# Patient Record
Sex: Female | Born: 1955 | Race: White | Hispanic: No | Marital: Married | Smoking: Former smoker
Health system: Southern US, Community
[De-identification: ages and names within clinical notes are randomized; demographics above are authoritative.]

## PROBLEM LIST (undated history)

## (undated) DIAGNOSIS — I509 Heart failure, unspecified: Secondary | ICD-10-CM

## (undated) DIAGNOSIS — M1711 Unilateral primary osteoarthritis, right knee: Secondary | ICD-10-CM

## (undated) DIAGNOSIS — Z7901 Long term (current) use of anticoagulants: Secondary | ICD-10-CM

## (undated) DIAGNOSIS — L719 Rosacea, unspecified: Secondary | ICD-10-CM

## (undated) DIAGNOSIS — M48061 Spinal stenosis, lumbar region without neurogenic claudication: Secondary | ICD-10-CM

## (undated) DIAGNOSIS — K219 Gastro-esophageal reflux disease without esophagitis: Secondary | ICD-10-CM

## (undated) DIAGNOSIS — B019 Varicella without complication: Secondary | ICD-10-CM

## (undated) DIAGNOSIS — E785 Hyperlipidemia, unspecified: Secondary | ICD-10-CM

## (undated) DIAGNOSIS — E119 Type 2 diabetes mellitus without complications: Secondary | ICD-10-CM

## (undated) DIAGNOSIS — K760 Fatty (change of) liver, not elsewhere classified: Secondary | ICD-10-CM

## (undated) DIAGNOSIS — M1990 Unspecified osteoarthritis, unspecified site: Secondary | ICD-10-CM

## (undated) DIAGNOSIS — G4730 Sleep apnea, unspecified: Secondary | ICD-10-CM

## (undated) DIAGNOSIS — E1140 Type 2 diabetes mellitus with diabetic neuropathy, unspecified: Secondary | ICD-10-CM

## (undated) DIAGNOSIS — R011 Cardiac murmur, unspecified: Secondary | ICD-10-CM

## (undated) DIAGNOSIS — C801 Malignant (primary) neoplasm, unspecified: Secondary | ICD-10-CM

## (undated) DIAGNOSIS — T7840XA Allergy, unspecified, initial encounter: Secondary | ICD-10-CM

## (undated) DIAGNOSIS — L4050 Arthropathic psoriasis, unspecified: Secondary | ICD-10-CM

## (undated) DIAGNOSIS — I10 Essential (primary) hypertension: Secondary | ICD-10-CM

## (undated) DIAGNOSIS — I00 Rheumatic fever without heart involvement: Secondary | ICD-10-CM

## (undated) DIAGNOSIS — L409 Psoriasis, unspecified: Secondary | ICD-10-CM

## (undated) DIAGNOSIS — C541 Malignant neoplasm of endometrium: Secondary | ICD-10-CM

## (undated) HISTORY — DX: Unspecified osteoarthritis, unspecified site: M19.90

## (undated) HISTORY — DX: Varicella without complication: B01.9

## (undated) HISTORY — DX: Rosacea, unspecified: L71.9

## (undated) HISTORY — DX: Cardiac murmur, unspecified: R01.1

## (undated) HISTORY — DX: Gastro-esophageal reflux disease without esophagitis: K21.9

## (undated) HISTORY — DX: Malignant (primary) neoplasm, unspecified: C80.1

## (undated) HISTORY — DX: Heart failure, unspecified: I50.9

## (undated) HISTORY — DX: Allergy, unspecified, initial encounter: T78.40XA

## (undated) HISTORY — DX: Psoriasis, unspecified: L40.9

## (undated) HISTORY — DX: Essential (primary) hypertension: I10

## (undated) HISTORY — DX: Sleep apnea, unspecified: G47.30

## (undated) HISTORY — DX: Hyperlipidemia, unspecified: E78.5

## (undated) HISTORY — DX: Rheumatic fever without heart involvement: I00

## (undated) HISTORY — PX: ABDOMINAL HYSTERECTOMY: SHX81

## (undated) HISTORY — PX: MOUTH SURGERY: SHX715

---

## 2014-01-07 LAB — HEPATIC FUNCTION PANEL
ALK PHOS: 93 U/L (ref 25–125)
ALT: 17 U/L (ref 7–35)
AST: 22 U/L (ref 13–35)
BILIRUBIN, TOTAL: 0.2 mg/dL

## 2014-01-07 LAB — TSH: TSH: 2.71 u[IU]/mL (ref <=5.90)

## 2014-01-07 LAB — LIPID PANEL
Cholesterol: 179 mg/dL (ref 0–200)
HDL: 51 mg/dL (ref 35–70)
LDL CALC: 95 mg/dL
Triglycerides: 163 mg/dL — AB (ref 40–160)

## 2014-01-07 LAB — HEMOGLOBIN A1C: Hgb A1c MFr Bld: 6.3 % — AB (ref 4.0–6.0)

## 2014-01-07 LAB — BASIC METABOLIC PANEL
CREATININE: 0.8 mg/dL (ref <=1.1)
POTASSIUM: 4.5 mmol/L (ref 3.4–5.3)
Sodium: 143 mmol/L (ref 137–147)

## 2014-04-12 ENCOUNTER — Encounter: Payer: Self-pay | Admitting: Internal Medicine

## 2014-04-12 ENCOUNTER — Encounter (INDEPENDENT_AMBULATORY_CARE_PROVIDER_SITE_OTHER): Payer: Self-pay

## 2014-04-12 ENCOUNTER — Ambulatory Visit (INDEPENDENT_AMBULATORY_CARE_PROVIDER_SITE_OTHER): Payer: BC Managed Care – PPO | Admitting: Internal Medicine

## 2014-04-12 VITALS — BP 148/92 | HR 68 | Temp 97.7°F | Ht 61.75 in | Wt 241.5 lb

## 2014-04-12 DIAGNOSIS — F458 Other somatoform disorders: Secondary | ICD-10-CM

## 2014-04-12 DIAGNOSIS — L409 Psoriasis, unspecified: Secondary | ICD-10-CM | POA: Insufficient documentation

## 2014-04-12 DIAGNOSIS — I10 Essential (primary) hypertension: Secondary | ICD-10-CM | POA: Insufficient documentation

## 2014-04-12 DIAGNOSIS — R0989 Other specified symptoms and signs involving the circulatory and respiratory systems: Secondary | ICD-10-CM

## 2014-04-12 DIAGNOSIS — K219 Gastro-esophageal reflux disease without esophagitis: Secondary | ICD-10-CM

## 2014-04-12 DIAGNOSIS — J302 Other seasonal allergic rhinitis: Secondary | ICD-10-CM | POA: Insufficient documentation

## 2014-04-12 DIAGNOSIS — M1990 Unspecified osteoarthritis, unspecified site: Secondary | ICD-10-CM

## 2014-04-12 DIAGNOSIS — E785 Hyperlipidemia, unspecified: Secondary | ICD-10-CM | POA: Insufficient documentation

## 2014-04-12 NOTE — Assessment & Plan Note (Signed)
She will self refer to dermatology

## 2014-04-12 NOTE — Patient Instructions (Signed)

## 2014-04-12 NOTE — Assessment & Plan Note (Signed)
Elevated today She is insistent that she does not want to start medication at this time If still elevated when she returns for her physical exam, will insist on medicinal therapy Encouraged her to work on diet and weight loss

## 2014-04-12 NOTE — Assessment & Plan Note (Signed)
Controlled on current dose of prilosec

## 2014-04-12 NOTE — Assessment & Plan Note (Signed)
Encouraged her to work on diet and exercise Water aerobics would be good for her given her history of arthritis

## 2014-04-12 NOTE — Assessment & Plan Note (Signed)
Mainly hips and neck Worse lately due to weight gain Encouraged her to work on weight loss with diet and exercise Continue Advil prn

## 2014-04-12 NOTE — Progress Notes (Signed)
Pre visit review using our clinic review tool, if applicable. No additional management support is needed unless otherwise documented below in the visit note. 

## 2014-04-12 NOTE — Assessment & Plan Note (Signed)
Uses Benadryl at night when symptoms occur Advised her to start Zyrtec every am to see if this helps

## 2014-04-12 NOTE — Assessment & Plan Note (Signed)
Lipid profile reviewed Diet controlled

## 2014-04-12 NOTE — Progress Notes (Signed)
HPI  Pt presents to the clinic today to establish care. She is transferring care from Dr. Rock Nephew at [facility], although she has not been seen there in a number of years.  Flu: never Tetanus: > 10 years ago LMP: 2012- post menopausal Pap Smear: 2002 Mammogram: 2002 Colon Screening: never Vision Screening: as needed Dentist: as needed  Arthritis: mainly in hips and neck. She takes Advil every night to help relieve the pain.  GERD: Takes prilosec daily. She recently started on this, and reports that it is working well for her. She is not having to use any additional antacids.  Seasonal Allergies: Takes benadryl when she notices the symptoms.  HTN: BP is elevated today at 148/92. She has been on medication for this in the past. She lost her insurance 3 years ago and had to stop her medication. She does note that she has put on weight since that time. She does plan on going to the gym to get some of the weight off.  HLD: She brought a copy of her labs to review. Total cholesterol 179, LDL 95, Triglycerides 163. Diet controlled.  Of note, her A1C is 6.3%  She is concerned about a tightening sensation in the throat. She noticed this a few months ago. The prilosec has not helped. Nothing makes it better or worse. She has not choked on her food.  Past Medical History  Diagnosis Date  . Arthritis   . Chicken pox   . GERD (gastroesophageal reflux disease)   . Allergy   . Heart murmur   . Hyperlipidemia   . Hypertension   . Rheumatic fever     Current Outpatient Prescriptions  Medication Sig Dispense Refill  . Calcium Carbonate Antacid (ANTACID CALCIUM PO) Take 1 tablet by mouth.    . Cholecalciferol (VITAMIN D3) 400 UNITS CAPS Take 1 capsule by mouth daily.    . diphenhydrAMINE (BENADRYL) 25 mg capsule Take 50 mg by mouth at bedtime.    Marland Kitchen ibuprofen (ADVIL,MOTRIN) 200 MG tablet Take 800 mg by mouth daily.    Marland Kitchen omeprazole (PRILOSEC) 40 MG capsule Take 40 mg by mouth daily.     . vitamin C (ASCORBIC ACID) 500 MG tablet Take 500 mg by mouth daily.     No current facility-administered medications for this visit.    No Known Allergies  Family History  Problem Relation Age of Onset  . Cancer Mother     skin  . Dementia Mother   . Cancer Sister     skin and liver    History   Social History  . Marital Status: Married    Spouse Name: N/A    Number of Children: N/A  . Years of Education: N/A   Occupational History  . Not on file.   Social History Main Topics  . Smoking status: Former Research scientist (life sciences)  . Smokeless tobacco: Never Used     Comment: quit in 1997  . Alcohol Use: No  . Drug Use: Not on file  . Sexual Activity: Not on file   Other Topics Concern  . Not on file   Social History Narrative  . No narrative on file    ROS:  Constitutional: Denies fever, malaise, fatigue, headache or abrupt weight changes.  HEENT: Pt reports sore throat. Denies eye pain, eye redness, ear pain, ringing in the ears, wax buildup, runny nose, nasal congestion, bloody nose. Respiratory: Denies difficulty breathing, shortness of breath, cough or sputum production.   Cardiovascular: Denies chest  pain, chest tightness, palpitations or swelling in the hands or feet.  Gastrointestinal: Denies abdominal pain, bloating, constipation, diarrhea or blood in the stool.  GU: Pt reports "fallen bladder".Denies frequency, urgency, pain with urination, blood in urine, odor or discharge. Musculoskeletal: Pt reports hip pain, neck pain. Denies decrease in range of motion, difficulty with gait, muscle pain or joint swelling.  Skin: Pt reports psoriasis. Denies redness or ulcercations.  Neurological: Denies dizziness, difficulty with memory, difficulty with speech or problems with balance and coordination. Psych: Pt denies depression, anxiety/SI/HI.   No other specific complaints in a complete review of systems (except as listed in HPI above).  PE:  Ht 5' 1.75" (1.568 m)  Wt 241 lb  8 oz (109.544 kg)  BMI 44.56 kg/m2 Wt Readings from Last 3 Encounters:  04/12/14 241 lb 8 oz (109.544 kg)    General: Appears her stated age, obese but well developed, well nourished in NAD. HEENT: Ears: Tm's gray and intact, normal light reflex; Throat/Mouth: Teeth present, mucosa pink and moist, + PND, no lesions or ulcerations noted.  Cardiovascular: Normal rate and rhythm. S1,S2 noted. Murmur noted.   Pulmonary/Chest: Normal effort and positive vesicular breath sounds. No respiratory distress. No wheezes, rales or ronchi noted.  Abdomen: Soft and nontender. Normal bowel sounds, no bruits noted. No distention or masses noted. Liver, spleen and kidneys non palpable. Musculoskeletal: Normal flexion, extension of the cervical spine. Decreased lateral rotation. No pain with palpation of the cervical spine. Decreased internal and external flexion of the hips (due to size). Strength 5/5 BUE/BLE.   Assessment and Plan:  Globus sensation:  Lets give the prilosec a little more time If still present when we do you CPE- will see if GI will consider possible EGD when we refer you for your colonoscopy.  Sore throat secondary to PND:  Start Zyrtec in the am OK to continue Benadryl QHS

## 2014-04-13 ENCOUNTER — Encounter: Payer: Self-pay | Admitting: Internal Medicine

## 2014-04-14 ENCOUNTER — Telehealth: Payer: Self-pay | Admitting: Internal Medicine

## 2014-04-14 NOTE — Telephone Encounter (Signed)
emmi emailed °

## 2014-05-16 ENCOUNTER — Encounter: Payer: Self-pay | Admitting: Internal Medicine

## 2014-05-16 ENCOUNTER — Ambulatory Visit (INDEPENDENT_AMBULATORY_CARE_PROVIDER_SITE_OTHER): Payer: BC Managed Care – PPO | Admitting: Internal Medicine

## 2014-05-16 VITALS — BP 142/90 | HR 62 | Temp 97.9°F | Ht 61.66 in | Wt 246.0 lb

## 2014-05-16 DIAGNOSIS — Z1211 Encounter for screening for malignant neoplasm of colon: Secondary | ICD-10-CM

## 2014-05-16 DIAGNOSIS — B372 Candidiasis of skin and nail: Secondary | ICD-10-CM

## 2014-05-16 DIAGNOSIS — Z0000 Encounter for general adult medical examination without abnormal findings: Secondary | ICD-10-CM

## 2014-05-16 DIAGNOSIS — IMO0002 Reserved for concepts with insufficient information to code with codable children: Secondary | ICD-10-CM

## 2014-05-16 DIAGNOSIS — Z1239 Encounter for other screening for malignant neoplasm of breast: Secondary | ICD-10-CM

## 2014-05-16 DIAGNOSIS — N8110 Cystocele, unspecified: Secondary | ICD-10-CM

## 2014-05-16 DIAGNOSIS — I10 Essential (primary) hypertension: Secondary | ICD-10-CM

## 2014-05-16 LAB — COMPREHENSIVE METABOLIC PANEL
ALT: 17 U/L (ref 0–35)
AST: 20 U/L (ref 0–37)
Albumin: 3.8 g/dL (ref 3.5–5.2)
Alkaline Phosphatase: 80 U/L (ref 39–117)
BILIRUBIN TOTAL: 0.5 mg/dL (ref 0.2–1.2)
BUN: 18 mg/dL (ref 6–23)
CO2: 30 meq/L (ref 19–32)
CREATININE: 0.9 mg/dL (ref 0.4–1.2)
Calcium: 9.5 mg/dL (ref 8.4–10.5)
Chloride: 105 mEq/L (ref 96–112)
GFR: 70.12 mL/min (ref >60.00)
GLUCOSE: 99 mg/dL (ref 70–99)
Potassium: 4.4 mEq/L (ref 3.5–5.1)
Sodium: 142 mEq/L (ref 135–145)
Total Protein: 7 g/dL (ref 6.0–8.3)

## 2014-05-16 LAB — CBC
HCT: 41.4 % (ref 36.0–46.0)
Hemoglobin: 13.7 g/dL (ref 12.0–15.0)
MCHC: 33.2 g/dL (ref 30.0–36.0)
MCV: 87.3 fl (ref 78.0–100.0)
Platelets: 327 10*3/uL (ref 150.0–400.0)
RBC: 4.74 Mil/uL (ref 3.87–5.11)
RDW: 13.3 % (ref 11.5–15.5)
WBC: 7.2 10*3/uL (ref 4.0–10.5)

## 2014-05-16 LAB — LIPID PANEL
CHOLESTEROL: 217 mg/dL — AB (ref 0–200)
HDL: 45.7 mg/dL (ref >39.00)
NonHDL: 171.3
Total CHOL/HDL Ratio: 5
Triglycerides: 278 mg/dL — ABNORMAL HIGH (ref 0.0–149.0)
VLDL: 55.6 mg/dL — ABNORMAL HIGH (ref 0.0–40.0)

## 2014-05-16 LAB — HEMOGLOBIN A1C: HEMOGLOBIN A1C: 6.6 % — AB (ref 4.6–6.5)

## 2014-05-16 LAB — LDL CHOLESTEROL, DIRECT: LDL DIRECT: 142 mg/dL

## 2014-05-16 MED ORDER — KETOCONAZOLE 2 % EX CREA: 1.0000 "application " | TOPICAL_CREAM | Freq: Every day | CUTANEOUS | Status: DC

## 2014-05-16 NOTE — Patient Instructions (Signed)

## 2014-05-16 NOTE — Assessment & Plan Note (Signed)
Elevated again today She declines starting medication She wants to try diet and exercise She understands her risk for heart attack and stroke

## 2014-05-16 NOTE — Progress Notes (Signed)
Pre visit review using our clinic review tool, if applicable. No additional management support is needed unless otherwise documented below in the visit note. 

## 2014-05-16 NOTE — Assessment & Plan Note (Signed)
Will give RX for ketoconazole cream

## 2014-05-16 NOTE — Progress Notes (Signed)
Subjective:    Patient ID: Melissa Cabrera, female    DOB: [birth year], 59 y.o.   MRN: [id number]  HPI  Pt presents to the clinic today for her annual exam.  Flu: never Tetanus: > 10 years ago LMP: 2012- post menopausal Pap Smear: 2002 Mammogram: 2002 Colon Screening: never Vision Screening: as needed Dentist: as needed  Diet: currently eating whatever she wants, she plans to start a low carb, low fat diet after today Exercise: Not exercising at all  Her BP is elevated again at this t visit- 148/92. She has gained 4.5 lbs in the last month. She was insistent at her last visit that she was not going to start blood pressure medication.   Review of Systems      Past Medical History  Diagnosis Date  . Arthritis   . Chicken pox   . GERD (gastroesophageal reflux disease)   . Allergy   . Heart murmur   . Hyperlipidemia   . Hypertension   . Rheumatic fever     Current Outpatient Prescriptions  Medication Sig Dispense Refill  . Calcium Carbonate Antacid (ANTACID CALCIUM PO) Take 1 tablet by mouth.    . Cholecalciferol (VITAMIN D3) 400 UNITS CAPS Take 1 capsule by mouth daily.    . diphenhydrAMINE (BENADRYL) 25 mg capsule Take 50 mg by mouth at bedtime.    Marland Kitchen ibuprofen (ADVIL,MOTRIN) 200 MG tablet Take 800 mg by mouth daily.    Marland Kitchen omeprazole (PRILOSEC) 40 MG capsule Take 40 mg by mouth daily.    . vitamin C (ASCORBIC ACID) 500 MG tablet Take 500 mg by mouth daily.     No current facility-administered medications for this visit.    No Known Allergies  Family History  Problem Relation Age of Onset  . Cancer Mother     skin  . Dementia Mother   . Cancer Sister     skin and liver  . Stroke Maternal Grandmother   . Diabetes Neg Hx   . Heart disease Neg Hx     History   Social History  . Marital Status: Married    Spouse Name: N/A    Number of Children: N/A  . Years of Education: N/A   Occupational History  . Not on file.   Social History Main Topics  .  Smoking status: Former Research scientist (life sciences)  . Smokeless tobacco: Never Used     Comment: quit in 1997  . Alcohol Use: No  . Drug Use: No  . Sexual Activity: Yes   Other Topics Concern  . Not on file   Social History Narrative     Constitutional: Denies fever, malaise, fatigue, headache or abrupt weight changes.  HEENT: Denies eye pain, eye redness, ear pain, ringing in the ears, wax buildup, runny nose, nasal congestion, bloody nose, or sore throat. Respiratory: Denies difficulty breathing, shortness of breath, cough or sputum production.   Cardiovascular: Denies chest pain, chest tightness, palpitations or swelling in the hands or feet.  Gastrointestinal: Denies abdominal pain, bloating, constipation, diarrhea or blood in the stool.  GU: Pt reports urinary incontinence. Denies urgency, frequency, pain with urination, burning sensation, blood in urine, odor or discharge. Musculoskeletal: Pt reports occasional joint pains. Denies decrease in range of motion, difficulty with gait, muscle pain or joint swelling.  Skin: Pt reports a rash underneath her belly. Denies lesions or ulcercations.  Neurological: Denies dizziness, difficulty with memory, difficulty with speech or problems with balance and coordination.   No other specific complaints  in a complete review of systems (except as listed in HPI above).  Objective:   Physical Exam   BP 142/90 mmHg  Pulse 62  Temp(Src) 97.9 F (36.6 C) (Oral)  Ht 5' 1.66" (1.566 m)  Wt 246 lb (111.585 kg)  BMI 45.50 kg/m2  SpO2 99%  Constitutional:  Alert, oriented x 4, obese in NAD. Skin: Skin is warm and dry.  Yeast noted under pannus and underneath breast as well. HEENT: Head: normal shape and size; Eyes: sclera white, no icterus, conjunctiva pink, PERRLA and EOMs intact; Ears: Tm's gray and intact, normal light reflex, slight cerumen buildup noted; Nose: mucosa pink and moist, septum midline; Throat/Mouth: Teeth missing , mucosa pink and moist, no  lesions or ulcerations noted. Neck:. Neck supple, trachea midline. No masses, lumps or thyromegaly present.  Cardiovascular: Normal rate and rhythm. S1,S2 noted.  No murmur, rubs or gallops noted. No JVD or BLE edema. No carotid bruits noted. Pulmonary/Chest: Normal effort and positive vesicular breath sounds. No respiratory distress. No wheezes, rales or ronchi noted.  Abdomen: Soft and nontender. Normal bowel sounds, no bruits noted. No distention or masses noted. Liver, spleen and kidneys non palpable. Genitourinary: Normal female anatomy. Cystocele noted- No CMT or discharge noted. Adenexa non palpable. Breast with fibrocystic changes noted bilaterally.  Musculoskeletal: Normal range of motion. Strength 5/5 BUE/BLE. No difficulty with gait.  Neurological: Alert and oriented. Cranial nerves II-XII grossly intact.  Psychiatric: She has a normal mood and affect. Behavior is normal. Judgment and thought content normal.       Assessment & Plan:   Preventative Health Maintenance:  She declines flu and Tdap today Will order Mammogram- she will call [facility] and set up Will refer to GI for screening colonoscopy Will refer to GI, was unable to obtain pap d/t cystocele Will check CBC, CMET, Lipid and A1C today Encouraged her to visit an eye doctor and dentist on a yearly basis  RTC in 6 months to follow up BP

## 2014-05-17 ENCOUNTER — Telehealth: Payer: Self-pay | Admitting: Internal Medicine

## 2014-05-17 NOTE — Telephone Encounter (Signed)
emmi emailed °

## 2014-05-23 ENCOUNTER — Encounter: Payer: Self-pay | Admitting: Internal Medicine

## 2014-05-23 ENCOUNTER — Ambulatory Visit (INDEPENDENT_AMBULATORY_CARE_PROVIDER_SITE_OTHER): Payer: BLUE CROSS/BLUE SHIELD | Admitting: Internal Medicine

## 2014-05-23 ENCOUNTER — Telehealth: Payer: Self-pay

## 2014-05-23 VITALS — BP 140/90 | HR 69 | Temp 97.6°F | Wt 246.0 lb

## 2014-05-23 DIAGNOSIS — E785 Hyperlipidemia, unspecified: Secondary | ICD-10-CM

## 2014-05-23 DIAGNOSIS — E119 Type 2 diabetes mellitus without complications: Secondary | ICD-10-CM

## 2014-05-23 NOTE — Patient Instructions (Addendum)
Diabetes and Standards of Medical Care Diabetes is complicated. You may find that your diabetes team includes a dietitian, nurse, diabetes educator, eye doctor, and more. To help everyone know what is going on and to help you get the care you deserve, the following schedule of care was developed to help keep you on track. Below are the tests, exams, vaccines, medicines, education, and plans you will need. HbA1c test This test shows how well you have controlled your glucose over the past 2-3 months. It is used to see if your diabetes management plan needs to be adjusted.   It is performed at least 2 times a year if you are meeting treatment goals.  It is performed 4 times a year if therapy has changed or if you are not meeting treatment goals. Blood pressure test  This test is performed at every routine medical visit. The goal is less than 140/90 mm Hg for most people, but 130/80 mm Hg in some cases. Ask your health care provider about your goal. Dental exam  Follow up with the dentist regularly. Eye exam  If you are diagnosed with type 1 diabetes as a child, get an exam upon reaching the age of 37 years or older and have had diabetes for 3-5 years. Yearly eye exams are recommended after that initial eye exam.  If you are diagnosed with type 1 diabetes as an adult, get an exam within 5 years of diagnosis and then yearly.  If you are diagnosed with type 2 diabetes, get an exam as soon as possible after the diagnosis and then yearly. Foot care exam  Visual foot exams are performed at every routine medical visit. The exams check for cuts, injuries, or other problems with the feet.  A comprehensive foot exam should be done yearly. This includes visual inspection as well as assessing foot pulses and testing for loss of sensation.  Check your feet nightly for cuts, injuries, or other problems with your feet. Tell your health care provider if anything is not healing. Kidney function test (urine  microalbumin)  This test is performed once a year.  Type 1 diabetes: The first test is performed 5 years after diagnosis.  Type 2 diabetes: The first test is performed at the time of diagnosis.  A serum creatinine and estimated glomerular filtration rate (eGFR) test is done once a year to assess the level of chronic kidney disease (CKD), if present. Lipid profile (cholesterol, HDL, LDL, triglycerides)  Performed every 5 years for most people.  The goal for LDL is less than 100 mg/dL. If you are at high risk, the goal is less than 70 mg/dL.  The goal for HDL is 40 mg/dL-50 mg/dL for men and 50 mg/dL-60 mg/dL for women. An HDL cholesterol of 60 mg/dL or higher gives some protection against heart disease.  The goal for triglycerides is less than 150 mg/dL. Influenza vaccine, pneumococcal vaccine, and hepatitis B vaccine  The influenza vaccine is recommended yearly.  It is recommended that people with diabetes who are over 24 years old get the pneumonia vaccine. In some cases, two separate shots may be given. Ask your health care provider if your pneumonia vaccination is up to date.  The hepatitis B vaccine is also recommended for adults with diabetes. Diabetes self-management education  Education is recommended at diagnosis and ongoing as needed. Treatment plan  Your treatment plan is reviewed at every medical visit. Document Released: 02/24/2009 Document Revised: 09/13/2013 Document Reviewed: 09/29/2012 [facility] Patient Information 2015 [place],  [employer]. This information is not intended to replace advice given to you by your health care provider. Make sure you discuss any questions you have with your health care provider. Fat and Cholesterol Control Diet Fat and cholesterol levels in your blood and organs are influenced by your diet. High levels of fat and cholesterol may lead to diseases of the heart, small and large blood vessels, gallbladder, liver, and pancreas. CONTROLLING FAT  AND CHOLESTEROL WITH DIET Although exercise and lifestyle factors are important, your diet is key. That is because certain foods are known to raise cholesterol and others to lower it. The goal is to balance foods for their effect on cholesterol and more importantly, to replace saturated and trans fat with other types of fat, such as monounsaturated fat, polyunsaturated fat, and omega-3 fatty acids. On average, a person should consume no more than 15 to 17 g of saturated fat daily. Saturated and trans fats are considered "bad" fats, and they will raise LDL cholesterol. Saturated fats are primarily found in animal products such as meats, butter, and cream. However, that does not mean you need to give up all your favorite foods. Today, there are good tasting, low-fat, low-cholesterol substitutes for most of the things you like to eat. Choose low-fat or nonfat alternatives. Choose round or loin cuts of red meat. These types of cuts are lowest in fat and cholesterol. Chicken (without the skin), fish, veal, and ground [place] breast are great choices. Eliminate fatty meats, such as hot dogs and salami. Even shellfish have little or no saturated fat. Have a 3 oz (85 g) portion when you eat lean meat, poultry, or fish. Trans fats are also called "partially hydrogenated oils." They are oils that have been scientifically manipulated so that they are solid at room temperature resulting in a longer shelf life and improved taste and texture of foods in which they are added. Trans fats are found in stick margarine, some tub margarines, cookies, crackers, and baked goods.  When baking and cooking, oils are a great substitute for butter. The monounsaturated oils are especially beneficial since it is believed they lower LDL and raise HDL. The oils you should avoid entirely are saturated tropical oils, such as coconut and palm.  Remember to eat a lot from food groups that are naturally free of saturated and trans fat, including  fish, fruit, vegetables, beans, grains (barley, rice, couscous, bulgur wheat), and pasta (without cream sauces).  IDENTIFYING FOODS THAT LOWER FAT AND CHOLESTEROL  Soluble fiber may lower your cholesterol. This type of fiber is found in fruits such as apples, vegetables such as broccoli, potatoes, and carrots, legumes such as beans, peas, and lentils, and grains such as barley. Foods fortified with plant sterols (phytosterol) may also lower cholesterol. You should eat at least 2 g per day of these foods for a cholesterol lowering effect.  Read package labels to identify low-saturated fats, trans fat free, and low-fat foods at the supermarket. Select cheeses that have only 2 to 3 g saturated fat per ounce. Use a heart-healthy tub margarine that is free of trans fats or partially hydrogenated oil. When buying baked goods (cookies, crackers), avoid partially hydrogenated oils. Breads and muffins should be made from whole grains (whole-wheat or whole oat flour, instead of "flour" or "enriched flour"). Buy non-creamy canned soups with reduced salt and no added fats.  FOOD PREPARATION TECHNIQUES  Never deep-fry. If you must fry, either stir-fry, which uses very little fat, or use non-stick cooking sprays. When  possible, broil, bake, or roast meats, and steam vegetables. Instead of putting butter or margarine on vegetables, use lemon and herbs, applesauce, and cinnamon (for squash and sweet potatoes). Use nonfat yogurt, salsa, and low-fat dressings for salads.  LOW-SATURATED FAT / LOW-FAT FOOD SUBSTITUTES Meats / Saturated Fat (g)  Avoid: Steak, marbled (3 oz/85 g) / 11 g  Choose: Steak, lean (3 oz/85 g) / 4 g  Avoid: Hamburger (3 oz/85 g) / 7 g  Choose: Hamburger, lean (3 oz/85 g) / 5 g  Avoid: Ham (3 oz/85 g) / 6 g  Choose: Ham, lean cut (3 oz/85 g) / 2.4 g  Avoid: Chicken, with skin, dark meat (3 oz/85 g) / 4 g  Choose: Chicken, skin removed, dark meat (3 oz/85 g) / 2 g  Avoid: Chicken, with  skin, light meat (3 oz/85 g) / 2.5 g  Choose: Chicken, skin removed, light meat (3 oz/85 g) / 1 g Dairy / Saturated Fat (g)  Avoid: Whole milk (1 cup) / 5 g  Choose: Low-fat milk, 2% (1 cup) / 3 g  Choose: Low-fat milk, 1% (1 cup) / 1.5 g  Choose: Skim milk (1 cup) / 0.3 g  Avoid: Hard cheese (1 oz/28 g) / 6 g  Choose: Skim milk cheese (1 oz/28 g) / 2 to 3 g  Avoid: Cottage cheese, 4% fat (1 cup) / 6.5 g  Choose: Low-fat cottage cheese, 1% fat (1 cup) / 1.5 g  Avoid: Ice cream (1 cup) / 9 g  Choose: Sherbet (1 cup) / 2.5 g  Choose: Nonfat frozen yogurt (1 cup) / 0.3 g  Choose: Frozen fruit bar / trace  Avoid: Whipped cream (1 tbs) / 3.5 g  Choose: Nondairy whipped topping (1 tbs) / 1 g Condiments / Saturated Fat (g)  Avoid: Mayonnaise (1 tbs) / 2 g  Choose: Low-fat mayonnaise (1 tbs) / 1 g  Avoid: Butter (1 tbs) / 7 g  Choose: Extra light margarine (1 tbs) / 1 g  Avoid: Coconut oil (1 tbs) / 11.8 g  Choose: Olive oil (1 tbs) / 1.8 g  Choose: Corn oil (1 tbs) / 1.7 g  Choose: Safflower oil (1 tbs) / 1.2 g  Choose: Sunflower oil (1 tbs) / 1.4 g  Choose: Soybean oil (1 tbs) / 2.4 g  Choose: Canola oil (1 tbs) / 1 g Document Released: 04/29/2005 Document Revised: 08/24/2012 Document Reviewed: 07/28/2013 [employer] Patient Information 2015 [place], [place]. This information is not intended to replace advice given to you by your health care provider. Make sure you discuss any questions you have with your health care provider.

## 2014-05-23 NOTE — Assessment & Plan Note (Addendum)
She reports she thinks this is related to poor diet during the holidays She is not interested in starting medication at this time She would like to try 3 months of lifestyle echanges including diet and exercise Handout given on low carb diet She declines flu and tetanus vaccines today Discussed annual eye exam for retinopathy Discussed foot exam and yearly microalbumin She declines referral to nutrition at this time  She will return in 3 months to reassess and will have labs 1 week prior to appointment

## 2014-05-23 NOTE — Telephone Encounter (Signed)
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 Pt also wants to know if it is OK for pt to have sexual intercourse before has bladder repaired. Pt does not have any discomfort during sexual encounter. Pt request cb.

## 2014-05-23 NOTE — Progress Notes (Signed)
Pre visit review using our clinic review tool, if applicable. No additional management support is needed unless otherwise documented below in the visit note. 

## 2014-05-23 NOTE — Assessment & Plan Note (Signed)
LDL 142- not at goal She is not interested in starting medication at this time She would like to try 3 months of lifestyle changes including diet and exercise Handout given on low fat diet

## 2014-05-23 NOTE — Telephone Encounter (Signed)
Pt is aware as instructed 

## 2014-05-23 NOTE — Progress Notes (Signed)
Subjective:    Patient ID: Melissa Cabrera, female    DOB: Feb 16, 1956, 59 y.o.   MRN: [id number]  HPI  Pt presents to the clinic today to follow up labs. She did have elevated cholesterol and and elevated A1C at 6.6%.  She has been on an antilipid medication in the past. She thinks it may have been lovstatin but is not definite. She stopped it because she did not want to take any medications. She has also been told that she was prediabetic in the past but has never been on Metformin. She is here today to discuss treatment options.  Review of Systems      Past Medical History  Diagnosis Date  . Arthritis   . Chicken pox   . GERD (gastroesophageal reflux disease)   . Allergy   . Heart murmur   . Hyperlipidemia   . Hypertension   . Rheumatic fever     Current Outpatient Prescriptions  Medication Sig Dispense Refill  . Calcium Carbonate Antacid (ANTACID CALCIUM PO) Take 1 tablet by mouth.    . Cholecalciferol (VITAMIN D3) 400 UNITS CAPS Take 1 capsule by mouth daily.    . diphenhydrAMINE (BENADRYL) 25 mg capsule Take 50 mg by mouth at bedtime.    Marland Kitchen ibuprofen (ADVIL,MOTRIN) 200 MG tablet Take 800 mg by mouth daily.    Marland Kitchen ketoconazole (NIZORAL) 2 % cream Apply 1 application topically daily. 30 g 0  . omeprazole (PRILOSEC) 40 MG capsule Take 40 mg by mouth daily.    . vitamin C (ASCORBIC ACID) 500 MG tablet Take 500 mg by mouth daily.     No current facility-administered medications for this visit.    No Known Allergies  Family History  Problem Relation Age of Onset  . Cancer Mother     skin  . Dementia Mother   . Cancer Sister     skin and liver  . Stroke Maternal Grandmother   . Diabetes Neg Hx   . Heart disease Neg Hx     History   Social History  . Marital Status: Married    Spouse Name: N/A    Number of Children: N/A  . Years of Education: N/A   Occupational History  . Not on file.   Social History Main Topics  . Smoking status: Former Research scientist (life sciences)  . Smokeless  tobacco: Never Used     Comment: quit in 1997  . Alcohol Use: No  . Drug Use: No  . Sexual Activity: Yes   Other Topics Concern  . Not on file   Social History Narrative     Constitutional: Denies fever, malaise, fatigue, headache or abrupt weight changes.  Respiratory: Denies difficulty breathing, shortness of breath, cough or sputum production.   Cardiovascular: Denies chest pain, chest tightness, palpitations or swelling in the hands or feet.  Gastrointestinal: Denies abdominal pain, bloating, constipation, diarrhea or blood in the stool.  GU: Pt reports frequency. Denies urgency, frequency, pain with urination, burning sensation, blood in urine, odor or discharge. Musculoskeletal: Denies decrease in range of motion, difficulty with gait, muscle pain or joint pain and swelling.  Skin: Denies redness, rashes, lesions or ulcercations.  Neurological: Denies numbness or tingling in hand or feet, dizziness, difficulty with memory, difficulty with speech or problems with balance and coordination.   No other specific complaints in a complete review of systems (except as listed in HPI above).  Objective:   Physical Exam   BP 140/90 mmHg  Pulse 69  Temp(Src)  97.6 F (36.4 C) (Oral)  Wt 246 lb (111.585 kg)  SpO2 98% Wt Readings from Last 3 Encounters:  05/23/14 246 lb (111.585 kg)  05/16/14 246 lb (111.585 kg)  04/12/14 241 lb 8 oz (109.544 kg)    General: Appears her stated age, obese in NAD. Skin: Warm, dry and intact. No rashes, lesions or ulcerations noted. Cardiovascular: Normal rate and rhythm. S1,S2 noted.  No murmur, rubs or gallops noted. No JVD or BLE edema. No carotid bruits noted. Pulmonary/Chest: Normal effort and positive vesicular breath sounds. No respiratory distress. No wheezes, rales or ronchi noted.  Abdomen: Soft and nontender. Normal bowel sounds, no bruits noted. No distention or masses noted. Liver, spleen and kidneys non palpable. Neurological: Alert and  oriented. Sensation intact to BLE.   BMET    Component Value Date/Time   NA 142 05/16/2014 0844   NA 143 01/07/2014   K 4.4 05/16/2014 0844   CL 105 05/16/2014 0844   CO2 30 05/16/2014 0844   GLUCOSE 99 05/16/2014 0844   BUN 18 05/16/2014 0844   CREATININE 0.9 05/16/2014 0844   CREATININE 0.8 01/07/2014   CALCIUM 9.5 05/16/2014 0844    Lipid Panel     Component Value Date/Time   CHOL 217* 05/16/2014 0844   TRIG 278.0* 05/16/2014 0844   HDL 45.70 05/16/2014 0844   CHOLHDL 5 05/16/2014 0844   VLDL 55.6* 05/16/2014 0844   LDLCALC 95 01/07/2014    CBC    Component Value Date/Time   WBC 7.2 05/16/2014 0844   RBC 4.74 05/16/2014 0844   HGB 13.7 05/16/2014 0844   HCT 41.4 05/16/2014 0844   PLT 327.0 05/16/2014 0844   MCV 87.3 05/16/2014 0844   MCHC 33.2 05/16/2014 0844   RDW 13.3 05/16/2014 0844    Hgb A1C Lab Results  Component Value Date   HGBA1C 6.6* 05/16/2014        Assessment & Plan:

## 2014-05-23 NOTE — Telephone Encounter (Signed)
Ok to work out and ok to have intercourse

## 2014-05-26 ENCOUNTER — Ambulatory Visit: Payer: Self-pay | Admitting: Internal Medicine

## 2014-05-27 ENCOUNTER — Encounter: Payer: Self-pay | Admitting: Internal Medicine

## 2014-06-08 ENCOUNTER — Telehealth: Payer: Self-pay

## 2014-06-08 NOTE — Telephone Encounter (Signed)
Pt left vm; pt was seen 05/23/2014; pt request rx for omeprazole DR 40 mg taking once capsule daily to [facility] [address]. Please advise. Omeprazole is on med list not the DR.

## 2014-06-08 NOTE — Telephone Encounter (Signed)
Ok to send RX for omeprazole

## 2014-06-09 MED ORDER — OMEPRAZOLE 40 MG PO CPDR: 40.0000 mg | DELAYED_RELEASE_CAPSULE | Freq: Every day | ORAL | Status: DC

## 2014-06-09 NOTE — Telephone Encounter (Signed)
Rx sent through [facility]  

## 2014-06-09 NOTE — Addendum Note (Signed)
Addended by: Lurlean Nanny on: 06/09/2014 04:14 PM   Modules accepted: Orders

## 2014-06-23 ENCOUNTER — Telehealth: Payer: Self-pay | Admitting: Internal Medicine

## 2014-06-23 NOTE — Telephone Encounter (Signed)
Pt called back wanting to let regina The cream (ketoconazole) that you prescribed for her belly this is not working. She used the whole tube.  She has problem with post nasal drip.  Her spouse had a  Rx (fluticasone pronamate nasal spray)  Ms Rosello used and it worked great for can regina prescribe this for her  [facility] [address]

## 2014-06-24 ENCOUNTER — Other Ambulatory Visit: Payer: Self-pay | Admitting: Internal Medicine

## 2014-06-24 MED ORDER — NYSTATIN 100000 UNIT/GM EX POWD: 1.0000 g | Freq: Three times a day (TID) | CUTANEOUS | Status: DC

## 2014-06-24 NOTE — Telephone Encounter (Signed)
Will try Nystatin powder instead of the cream- RX sent to pharmacy Fluticasone is OTC- she does not need a RX

## 2014-06-28 NOTE — Telephone Encounter (Signed)
Called pt but was hung up on--wanted to confirm pt picked up new Rx for Nystatin

## 2014-07-19 ENCOUNTER — Other Ambulatory Visit: Payer: Self-pay | Admitting: Internal Medicine

## 2014-07-19 NOTE — Telephone Encounter (Signed)
Last filled 06/24/14--please advise

## 2014-08-16 ENCOUNTER — Other Ambulatory Visit: Payer: Self-pay | Admitting: Internal Medicine

## 2014-08-22 ENCOUNTER — Ambulatory Visit (INDEPENDENT_AMBULATORY_CARE_PROVIDER_SITE_OTHER): Payer: BLUE CROSS/BLUE SHIELD | Admitting: Internal Medicine

## 2014-08-22 ENCOUNTER — Encounter: Payer: Self-pay | Admitting: Internal Medicine

## 2014-08-22 VITALS — BP 134/86 | HR 77 | Temp 97.8°F | Wt 247.0 lb

## 2014-08-22 DIAGNOSIS — I10 Essential (primary) hypertension: Secondary | ICD-10-CM

## 2014-08-22 DIAGNOSIS — J302 Other seasonal allergic rhinitis: Secondary | ICD-10-CM | POA: Diagnosis not present

## 2014-08-22 DIAGNOSIS — R21 Rash and other nonspecific skin eruption: Secondary | ICD-10-CM

## 2014-08-22 DIAGNOSIS — M1990 Unspecified osteoarthritis, unspecified site: Secondary | ICD-10-CM

## 2014-08-22 DIAGNOSIS — E119 Type 2 diabetes mellitus without complications: Secondary | ICD-10-CM

## 2014-08-22 DIAGNOSIS — E785 Hyperlipidemia, unspecified: Secondary | ICD-10-CM

## 2014-08-22 DIAGNOSIS — K219 Gastro-esophageal reflux disease without esophagitis: Secondary | ICD-10-CM

## 2014-08-22 NOTE — Assessment & Plan Note (Signed)
A1C 6.5 off meds Encouraged her to get a yearly eye exam She declines flu and pneumonia vaccine Foot exam today Handout given on low fat diet Encourage aerobic exercise 3 days out of the week

## 2014-08-22 NOTE — Progress Notes (Signed)
Pre visit review using our clinic review tool, if applicable. No additional management support is needed unless otherwise documented below in the visit note. 

## 2014-08-22 NOTE — Assessment & Plan Note (Signed)
Flaring now with changes in the weather Advised her to continue Flonase and Benadryl

## 2014-08-22 NOTE — Progress Notes (Signed)
Subjective:    Patient ID: Melissa Cabrera, female    DOB: [birth year], 59 y.o.   MRN: [id number]  HPI  Pt presents to the clinic today for 6 month follow up of chronic conditions.  Arthritis: She takes Advil as needed for joint pain and stiffness.  HTN: Her BP has been slightly about 140/90. She is not symptomatic. Her BP today is 134/86.  GERD: She denies breakthrough symptoms on Prilosec. She will take Tums occasionally if needed.  HLD: Her total cholesterol is 214, triglycerides 224, HDL 47 and LDL 122. This is slightly improved from prior. She did not want to start cholesterol medication at her last visit, but wanted to try to work on diet and exercise. She has tried to consume a low fat diet.  Seasonal Allergies: Starting to flare up. She is taking Benadryl and Flonase daily.  Obesity: Her weight today is 247 lbs, BMI of 45.68. She has gained 1 lb since her last visit. She plans to start going to the gym 3 days per week.  DM 2: Her A1C from 1 week ago is 6.5%, down form 6.6%. She is not on any medication at this time. She has been working on low carb diet and exercise. She does not check her sugars at home. Eye exam. She declines flu and pneumonia vaccines.  She does have some concerns about a rash on her legs. She first noticed this about 6 months ago. It consists of small round lesions, scattered throughout. The rash does not itch. She has not changed lotions, soap or detergents. She has not put anything on the rash.   Review of Systems      Past Medical History  Diagnosis Date  . Arthritis   . Chicken pox   . GERD (gastroesophageal reflux disease)   . Allergy   . Heart murmur   . Hyperlipidemia   . Hypertension   . Rheumatic fever     Current Outpatient Prescriptions  Medication Sig Dispense Refill  . Calcium Carbonate Antacid (ANTACID CALCIUM PO) Take 1 tablet by mouth.    . Cholecalciferol (VITAMIN D3) 400 UNITS CAPS Take 1 capsule by mouth daily.    .  diphenhydrAMINE (BENADRYL) 25 mg capsule Take 50 mg by mouth at bedtime.    Marland Kitchen ibuprofen (ADVIL,MOTRIN) 200 MG tablet Take 800 mg by mouth daily.    Marland Kitchen nystatin (MYCOSTATIN) powder APPLY 1 GRAM TOPICALLY THREE TIMES DAILY, AS DIRECTED 30 g 0  . omeprazole (PRILOSEC) 40 MG capsule Take 1 capsule (40 mg total) by mouth daily. 30 capsule 5  . vitamin C (ASCORBIC ACID) 500 MG tablet Take 500 mg by mouth daily.     No current facility-administered medications for this visit.    No Known Allergies  Family History  Problem Relation Age of Onset  . Cancer Mother     skin  . Dementia Mother   . Cancer Sister     skin and liver  . Stroke Maternal Grandmother   . Diabetes Neg Hx   . Heart disease Neg Hx     History   Social History  . Marital Status: Married    Spouse Name: N/A  . Number of Children: N/A  . Years of Education: N/A   Occupational History  . Not on file.   Social History Main Topics  . Smoking status: Former Research scientist (life sciences)  . Smokeless tobacco: Never Used     Comment: quit in 1997  . Alcohol Use: No  .  Drug Use: No  . Sexual Activity: Yes   Other Topics Concern  . Not on file   Social History Narrative     Constitutional: Denies fever, malaise, fatigue, headache or abrupt weight changes.  HEENT: Denies eye pain, eye redness, ear pain, ringing in the ears, wax buildup, runny nose, nasal congestion, bloody nose, or sore throat. Respiratory: Denies difficulty breathing, shortness of breath, cough or sputum production.   Cardiovascular: Denies chest pain, chest tightness, palpitations or swelling in the hands or feet.  Gastrointestinal: Denies abdominal pain, bloating, constipation, diarrhea or blood in the stool.  Musculoskeletal: Pt reports joint stiffness. Denies decrease in range of motion, difficulty with gait, muscle pain or joint swelling.  Skin: Pt reports rash to bilateral lower extremities. Denies redness, lesions or ulcercations.  Neurological: Denies  dizziness, difficulty with memory, difficulty with speech or problems with balance and coordination.  Psych: Denies anxiety, depression, SI/HI.  No other specific complaints in a complete review of systems (except as listed in HPI above).  Objective:   Physical Exam   BP 134/86 mmHg  Pulse 77  Temp(Src) 97.8 F (36.6 C) (Oral)  Wt 247 lb (112.038 kg)  SpO2 98% Wt Readings from Last 3 Encounters:  08/22/14 247 lb (112.038 kg)  05/23/14 246 lb (111.585 kg)  05/16/14 246 lb (111.585 kg)    General: Appears her stated age, obese in NAD. Skin: Warm, dry and intact. Small, round, scaly lesions noted on BLE. They are scattered. Do not appear to be bug bites. HEENT: Head: normal shape and size; Eyes: sclera white, no icterus, conjunctiva pink, PERRLA and EOMs intact; Ears: Tm's gray and intact, normal light reflex; Throat/Mouth: Teeth present, mucosa pink and moist, no exudate, lesions or ulcerations noted.  Neck: Neck supple, trachea midline. No masses, lumps or thyromegaly present.  Cardiovascular: Normal rate and rhythm. S1,S2 noted.  No murmur, rubs or gallops noted.. No carotid bruits noted. Pulmonary/Chest: Normal effort and positive vesicular breath sounds. No respiratory distress. No wheezes, rales or ronchi noted.  Abdomen: Soft and nontender. Normal bowel sounds, no bruits noted. No distention or masses noted.  Musculoskeletal: No signs of joint swelling. No difficulty with gait.  Neurological: Alert and oriented.    BMET    Component Value Date/Time   NA 142 05/16/2014 0844   NA 143 01/07/2014   K 4.4 05/16/2014 0844   CL 105 05/16/2014 0844   CO2 30 05/16/2014 0844   GLUCOSE 99 05/16/2014 0844   BUN 18 05/16/2014 0844   CREATININE 0.9 05/16/2014 0844   CREATININE 0.8 01/07/2014   CALCIUM 9.5 05/16/2014 0844    Lipid Panel     Component Value Date/Time   CHOL 217* 05/16/2014 0844   TRIG 278.0* 05/16/2014 0844   HDL 45.70 05/16/2014 0844   CHOLHDL 5 05/16/2014  0844   VLDL 55.6* 05/16/2014 0844   LDLCALC 95 01/07/2014    CBC    Component Value Date/Time   WBC 7.2 05/16/2014 0844   RBC 4.74 05/16/2014 0844   HGB 13.7 05/16/2014 0844   HCT 41.4 05/16/2014 0844   PLT 327.0 05/16/2014 0844   MCV 87.3 05/16/2014 0844   MCHC 33.2 05/16/2014 0844   RDW 13.3 05/16/2014 0844    Hgb A1C Lab Results  Component Value Date   HGBA1C 6.6* 05/16/2014        Assessment & Plan:   Rash on BLE:  She reports history of psoriasis ? If this is related She does not want  to try prednisone, because she does not want her sugar levels to go up She does not want to try triamcinolone cream, because she reports there are too many lesions Advised her to go ahead and schedule an appt with derm for further evaluation  RTC in 6 months or sooner if needed

## 2014-08-22 NOTE — Assessment & Plan Note (Signed)
Controlled off meds CBC and CMET reviewed Encouraged her to work on diet and exercise

## 2014-08-22 NOTE — Assessment & Plan Note (Signed)
LDL improved but not at goal She does still not want to take any cholesterol medication Advised her to consume a low fat diet and increase her aerobic exercise CMET and Lipid Profile reviewed

## 2014-08-22 NOTE — Assessment & Plan Note (Signed)
Stable on Prilosec Discussed the benefits of weight loss on her reflux symptoms

## 2014-08-22 NOTE — Assessment & Plan Note (Signed)
Continue Advil prn

## 2014-08-22 NOTE — Assessment & Plan Note (Signed)
Encouraged her to work on diet and exercise 

## 2014-08-22 NOTE — Patient Instructions (Signed)

## 2014-09-09 ENCOUNTER — Other Ambulatory Visit: Payer: Self-pay | Admitting: Internal Medicine

## 2014-10-12 ENCOUNTER — Other Ambulatory Visit: Payer: Self-pay | Admitting: Internal Medicine

## 2014-10-12 NOTE — Telephone Encounter (Signed)
Last filled 09/09/2014--please advise

## 2014-11-10 ENCOUNTER — Other Ambulatory Visit: Payer: Self-pay | Admitting: Internal Medicine

## 2014-11-21 ENCOUNTER — Ambulatory Visit: Payer: BC Managed Care – PPO | Admitting: Internal Medicine

## 2014-11-30 ENCOUNTER — Other Ambulatory Visit: Payer: Self-pay | Admitting: Internal Medicine

## 2015-02-21 ENCOUNTER — Ambulatory Visit: Payer: BLUE CROSS/BLUE SHIELD | Admitting: Internal Medicine

## 2015-12-08 ENCOUNTER — Other Ambulatory Visit: Payer: Self-pay | Admitting: Internal Medicine

## 2016-05-03 ENCOUNTER — Other Ambulatory Visit: Payer: Self-pay | Admitting: Internal Medicine

## 2016-05-03 ENCOUNTER — Telehealth: Payer: Self-pay

## 2016-05-03 NOTE — Telephone Encounter (Signed)
Received results from [facility]---per [facility] note, LDL still not at goal. Would she like to try a low dose Lipitor? A1C is higher but still okay off medication. Pt needs to schedule annual exam  Left detailed msg on VM per HIPAA

## 2016-05-13 DIAGNOSIS — Z86718 Personal history of other venous thrombosis and embolism: Secondary | ICD-10-CM

## 2016-05-13 HISTORY — DX: Personal history of other venous thrombosis and embolism: Z86.718

## 2016-08-07 ENCOUNTER — Encounter: Payer: Self-pay | Admitting: Internal Medicine

## 2016-08-07 ENCOUNTER — Ambulatory Visit (INDEPENDENT_AMBULATORY_CARE_PROVIDER_SITE_OTHER): Payer: BLUE CROSS/BLUE SHIELD | Admitting: Internal Medicine

## 2016-08-07 ENCOUNTER — Ambulatory Visit (INDEPENDENT_AMBULATORY_CARE_PROVIDER_SITE_OTHER)
Admission: RE | Admit: 2016-08-07 | Discharge: 2016-08-07 | Disposition: A | Discharge status: Home / Self Care | Payer: BLUE CROSS/BLUE SHIELD | Source: Ambulatory Visit | Attending: Internal Medicine | Admitting: Internal Medicine

## 2016-08-07 VITALS — BP 138/86 | HR 74 | Temp 98.4°F | Wt 248.8 lb

## 2016-08-07 DIAGNOSIS — M79672 Pain in left foot: Secondary | ICD-10-CM

## 2016-08-07 DIAGNOSIS — M5441 Lumbago with sciatica, right side: Secondary | ICD-10-CM | POA: Diagnosis not present

## 2016-08-07 DIAGNOSIS — L409 Psoriasis, unspecified: Secondary | ICD-10-CM | POA: Diagnosis not present

## 2016-08-07 DIAGNOSIS — I10 Essential (primary) hypertension: Secondary | ICD-10-CM

## 2016-08-07 DIAGNOSIS — L989 Disorder of the skin and subcutaneous tissue, unspecified: Secondary | ICD-10-CM

## 2016-08-07 MED ORDER — AMLODIPINE BESYLATE 10 MG PO TABS: 10.0000 mg | ORAL_TABLET | Freq: Every day | ORAL | 1 refills | Status: DC

## 2016-08-07 MED ORDER — PREDNISONE 10 MG PO TABS: ORAL_TABLET | ORAL | 0 refills | Status: DC

## 2016-08-07 NOTE — Patient Instructions (Signed)
Hypertension °Hypertension is another name for high blood pressure. High blood pressure forces your heart to work harder to pump blood. This can cause problems over time. °There are two numbers in a blood pressure reading. There is a top number (systolic) over a bottom number (diastolic). It is best to have a blood pressure below 120/80. Healthy choices can help lower your blood pressure. You may need medicine to help lower your blood pressure if: °· Your blood pressure cannot be lowered with healthy choices. °· Your blood pressure is higher than 130/80. °Follow these instructions at home: °Eating and drinking  °· If directed, follow the DASH eating plan. This diet includes: °¨ Filling half of your plate at each meal with fruits and vegetables. °¨ Filling one quarter of your plate at each meal with whole grains. Whole grains include whole wheat pasta, brown rice, and whole grain bread. °¨ Eating or drinking low-fat dairy products, such as skim milk or low-fat yogurt. °¨ Filling one quarter of your plate at each meal with low-fat (lean) proteins. Low-fat proteins include fish, skinless chicken, eggs, beans, and tofu. °¨ Avoiding fatty meat, cured and processed meat, or chicken with skin. °¨ Avoiding premade or processed food. °· Eat less than 1,500 mg of salt (sodium) a day. °· Limit alcohol use to no more than 1 drink a day for nonpregnant women and 2 drinks a day for men. One drink equals 12 oz of beer, 5 oz of wine, or 1½ oz of hard liquor. °Lifestyle  °· Work with your doctor to stay at a healthy weight or to lose weight. Ask your doctor what the best weight is for you. °· Get at least 30 minutes of exercise that causes your heart to beat faster (aerobic exercise) most days of the week. This may include walking, swimming, or biking. °· Get at least 30 minutes of exercise that strengthens your muscles (resistance exercise) at least 3 days a week. This may include lifting weights or pilates. °· Do not use any  products that contain nicotine or tobacco. This includes cigarettes and e-cigarettes. If you need help quitting, ask your doctor. °· Check your blood pressure at home as told by your doctor. °· Keep all follow-up visits as told by your doctor. This is important. °Medicines  °· Take over-the-counter and prescription medicines only as told by your doctor. Follow directions carefully. °· Do not skip doses of blood pressure medicine. The medicine does not work as well if you skip doses. Skipping doses also puts you at risk for problems. °· Ask your doctor about side effects or reactions to medicines that you should watch for. °Contact a doctor if: °· You think you are having a reaction to the medicine you are taking. °· You have headaches that keep coming back (recurring). °· You feel dizzy. °· You have swelling in your ankles. °· You have trouble with your vision. °Get help right away if: °· You get a very bad headache. °· You start to feel confused. °· You feel weak or numb. °· You feel faint. °· You get very bad pain in your: °¨ Chest. °¨ Belly (abdomen). °· You throw up (vomit) more than once. °· You have trouble breathing. °Summary °· Hypertension is another name for high blood pressure. °· Making healthy choices can help lower blood pressure. If your blood pressure cannot be controlled with healthy choices, you may need to take medicine. °This information is not intended to replace advice given to you by your   health care provider. Make sure you discuss any questions you have with your health care provider. °Document Released: 10/16/2007 Document Revised: 03/27/2016 Document Reviewed: 03/27/2016 °Elsevier Interactive Patient Education © 2017 [employer] ° °

## 2016-08-07 NOTE — Progress Notes (Signed)
Subjective:    Patient ID: Melissa Cabrera, female    DOB: 02/15/1956, 61 y.o.   MRN: [id number]  HPI  Pt presents to the clinic today with a few concerns.  1- She has a spot on her back. She noticed this 1 week ago. She reports the lesion is raised. It has not gotten bigger in size. It does not itch or burn. She has not put anything on it. She has a family history of skin cancer.  2- She also c/o left foot pain. She reports the other day when she was walking, her foot "popped". She describes the pain as sharp. It is worse with weight bearing. She has noticed some swelling but no bruising. She has not taken anything OTC for this. She has a history of DM 2, but denies neuropathy.  3- She also c/o right lower back pain. This started 2-3 months ago. The pain comes and goes. She describes the pain as crampy with sharp shooting pains down her legs. She denies numbness or tingling in her leg. She denies issues with bowel or bladder. The pain is worse with reaching up or bending over. She denies any injury to her back. She has not taken anything OTC for this.   4- Of note, her BP is elevated today at 138/86. Her last few BP's were 134/86, 140/90, 142/90, 148/92. She is not taking any blood pressure medication at this time.   Review of Systems  Past Medical History:  Diagnosis Date  . Allergy   . Arthritis   . Chicken pox   . GERD (gastroesophageal reflux disease)   . Heart murmur   . Hyperlipidemia   . Hypertension   . Rheumatic fever     Current Outpatient Prescriptions  Medication Sig Dispense Refill  . Calcium Carbonate Antacid (ANTACID CALCIUM PO) Take 1 tablet by mouth.    . Cholecalciferol (VITAMIN D3) 400 UNITS CAPS Take 1 capsule by mouth daily.    . diphenhydrAMINE (BENADRYL) 25 mg capsule Take 50 mg by mouth at bedtime.    . fluticasone (FLONASE) 50 MCG/ACT nasal spray Place 2 sprays into both nostrils daily.    Marland Kitchen ibuprofen (ADVIL,MOTRIN) 200 MG tablet Take 800 mg by mouth  daily.    Marland Kitchen nystatin (MYCOSTATIN) powder APPLY ONE GRAM OF POWDER TOPICALLY THREE TIMES DAILY AS DIRECTED 30 g 0  . omeprazole (PRILOSEC) 40 MG capsule Take 1 capsule (40 mg total) by mouth daily. NO MORE REFILLS WITHOUT ANNUAL EXAM 30 capsule 1  . vitamin C (ASCORBIC ACID) 500 MG tablet Take 500 mg by mouth daily.     No current facility-administered medications for this visit.     No Known Allergies  Family History  Problem Relation Age of Onset  . Cancer Mother     skin  . Dementia Mother   . Cancer Sister     skin and liver  . Stroke Maternal Grandmother   . Diabetes Neg Hx   . Heart disease Neg Hx     Social History   Social History  . Marital status: Married    Spouse name: N/A  . Number of children: N/A  . Years of education: N/A   Occupational History  . Not on file.   Social History Main Topics  . Smoking status: Former Research scientist (life sciences)  . Smokeless tobacco: Never Used     Comment: quit in 1997  . Alcohol use No  . Drug use: No  . Sexual activity: Yes  Other Topics Concern  . Not on file   Social History Narrative  . No narrative on file     Constitutional: Denies fever, malaise, fatigue, headache or abrupt weight changes.  Respiratory: Denies difficulty breathing, shortness of breath, cough or sputum production.   Cardiovascular: Denies chest pain, chest tightness, palpitations or swelling in the hands or feet.  Gastrointestinal: Denies abdominal pain, bloating, constipation, diarrhea or blood in the stool.  GU: Denies urgency, frequency, pain with urination, burning sensation, blood in urine, odor or discharge. Musculoskeletal: Pt reports right low back pain and left foot pain. Denies decrease in range of motion, muscle pain.  Skin: Pt reports lesion of back and psoriasis. Denies or ulcercations.  Neurological: Denies dizziness, difficulty with memory, difficulty with speech or problems with balance and coordination.    No other specific complaints in a  complete review of systems (except as listed in HPI above).     Objective:   Physical Exam BP 138/86   Pulse 74   Temp 98.4 F (36.9 C) (Oral)   Wt 248 lb 12 oz (112.8 kg)   SpO2 97%   BMI 46.00 kg/m  Wt Readings from Last 3 Encounters:  08/07/16 248 lb 12 oz (112.8 kg)  08/22/14 247 lb (112 kg)  05/23/14 246 lb (111.6 kg)    General: Appears her stated age, obese in NAD. Skin: Raised, abnormally colored scaly lesion noted on left side of back. Patch of psoriasis noted on upper buttocks. Cardiovascular: Normal rate and rhythm. S1,S2 noted.   Pulmonary/Chest: Normal effort and positive vesicular breath sounds. No respiratory distress. No wheezes, rales or ronchi noted.  Musculoskeletal: Normal flexion, extension and rotation of the spine. No bony tenderness noted over the spine. Strength 5/5 BLE. She is able to walk on heels but not on toes due to pain. Pain on the lateral edge of the left foot. Neurological: Alert and oriented. Sensation intact to BLE. Psychiatric: Mood and affect normal. Behavior is normal. Judgment and thought content normal.     BMET    Component Value Date/Time   NA 142 05/16/2014 0844   NA 143 01/07/2014   K 4.4 05/16/2014 0844   CL 105 05/16/2014 0844   CO2 30 05/16/2014 0844   GLUCOSE 99 05/16/2014 0844   BUN 18 05/16/2014 0844   CREATININE 0.9 05/16/2014 0844   CALCIUM 9.5 05/16/2014 0844    Lipid Panel     Component Value Date/Time   CHOL 217 (H) 05/16/2014 0844   TRIG 278.0 (H) 05/16/2014 0844   HDL 45.70 05/16/2014 0844   CHOLHDL 5 05/16/2014 0844   VLDL 55.6 (H) 05/16/2014 0844   LDLCALC 95 01/07/2014    CBC    Component Value Date/Time   WBC 7.2 05/16/2014 0844   RBC 4.74 05/16/2014 0844   HGB 13.7 05/16/2014 0844   HCT 41.4 05/16/2014 0844   PLT 327.0 05/16/2014 0844   MCV 87.3 05/16/2014 0844   MCHC 33.2 05/16/2014 0844   RDW 13.3 05/16/2014 0844    Hgb A1C Lab Results  Component Value Date   HGBA1C 6.6 (H)  05/16/2014             Assessment & Plan:   Skin Lesion of Back and Psoriasis:  Concerning for a cancerous lesion Referral to dermatology placed today- see Rosaria Ferries on the way out to schedule  HTN:  Discussed risks of untreated HTN Advised her to consume a low salt diet and exercise for weight loss Will start  Norvasc 10 mg daily  RTC in 1 month for follow up of HTN  Right Side Low Back Pain with Sciatica:  Encouraged stretching exercises eRx for Pred Taper A heating pad may be helpful  Left Foot Pain:  She is requesting xray today, ordered Will follow up after xray  Webb Silversmith, NP

## 2016-08-08 ENCOUNTER — Encounter: Payer: Self-pay | Admitting: Internal Medicine

## 2016-08-12 ENCOUNTER — Telehealth: Payer: Self-pay | Admitting: Internal Medicine

## 2016-08-12 NOTE — Telephone Encounter (Signed)
Patient Name: DILCIA RYBARCZYK  DOB: 02-07-1956    Initial Comment caller states she hasn't had a period in 6 years an last couple days she has been spotting. It feels like she is about to start her cycle. She is 61 years old . Wants to know if she should come in . She called doctor office an they sent her over to [place]    Nurse Assessment  Nurse: Verlin Fester, RN, [facility] Date/Time Eilene Ghazi Time): 08/12/2016 11:40:33 AM  Confirm and document reason for call. If symptomatic, describe symptoms. ---Caller states she hasn't had a period in 6 years an last couple days she has been spotting. It feels like she is about to start her cycle. She is 61 years old . Wants to know if she should come in  Does the patient have any new or worsening symptoms? ---Yes  Will a triage be completed? ---Yes  Related visit to physician within the last 2 weeks? ---No  Does the PT have any chronic conditions? (i.e. diabetes, asthma, etc.) ---Yes  List chronic conditions. ---"HTN,  Is this a behavioral health or substance abuse call? ---No     Guidelines    Guideline Title Affirmed Question Affirmed Notes  Vaginal Bleeding - Postmenopausal Bleeding lasts for > 7 days    Final Disposition User   See PCP When Office is Open (within 3 days) Verlin Fester, RN, [facility]    Referrals  REFERRED TO PCP OFFICE   Disagree/Comply: Leta Baptist

## 2016-08-12 NOTE — Telephone Encounter (Signed)
Noted, patient on PCP's schedule for 08/15/16.

## 2016-08-13 NOTE — Telephone Encounter (Signed)
Will discuss at upcoming appt.

## 2016-08-15 ENCOUNTER — Other Ambulatory Visit (HOSPITAL_COMMUNITY)
Admission: RE | Admit: 2016-08-15 | Discharge: 2016-08-15 | Disposition: A | Discharge status: Home / Self Care | Payer: BLUE CROSS/BLUE SHIELD | Source: Ambulatory Visit | Attending: Internal Medicine | Admitting: Internal Medicine

## 2016-08-15 ENCOUNTER — Ambulatory Visit (INDEPENDENT_AMBULATORY_CARE_PROVIDER_SITE_OTHER): Payer: BLUE CROSS/BLUE SHIELD | Admitting: Internal Medicine

## 2016-08-15 ENCOUNTER — Encounter: Payer: Self-pay | Admitting: Internal Medicine

## 2016-08-15 VITALS — BP 134/82 | HR 70 | Temp 98.1°F | Wt 245.5 lb

## 2016-08-15 DIAGNOSIS — N950 Postmenopausal bleeding: Secondary | ICD-10-CM | POA: Diagnosis not present

## 2016-08-15 DIAGNOSIS — R21 Rash and other nonspecific skin eruption: Secondary | ICD-10-CM

## 2016-08-15 DIAGNOSIS — K219 Gastro-esophageal reflux disease without esophagitis: Secondary | ICD-10-CM | POA: Diagnosis not present

## 2016-08-15 DIAGNOSIS — N889 Noninflammatory disorder of cervix uteri, unspecified: Secondary | ICD-10-CM

## 2016-08-15 MED ORDER — OMEPRAZOLE 40 MG PO CPDR: 40.0000 mg | DELAYED_RELEASE_CAPSULE | Freq: Every day | ORAL | 1 refills | Status: DC

## 2016-08-15 MED ORDER — CLOTRIMAZOLE-BETAMETHASONE 1-0.05 % EX CREA: 1.0000 "application " | TOPICAL_CREAM | Freq: Two times a day (BID) | CUTANEOUS | 0 refills | Status: DC

## 2016-08-15 NOTE — Progress Notes (Signed)
Pre visit review using our clinic review tool, if applicable. No additional management support is needed unless otherwise documented below in the visit note. 

## 2016-08-15 NOTE — Patient Instructions (Signed)
Postmenopausal Bleeding Postmenopausal bleeding is any bleeding after menopause. Menopause is when a woman's period stops. Any type of bleeding after menopause is concerning. It should be checked by your doctor. Any treatment will depend on the cause. Follow these instructions at home: Watch your condition for any changes.  Avoid the use of tampons and douches as told by your doctor.  Change your pads often.  Get regular pelvic exams and Pap tests.  Keep all appointments for tests as told by your doctor.  Contact a doctor if:  Your bleeding lasts for more than 1 week.  You have belly (abdominal) pain.  You have bleeding after sex (intercourse). Get help right away if:  You have a fever, chills, a headache, dizziness, muscle aches, and bleeding.  You have strong pain with bleeding.  You have clumps of blood (blood clots) coming from your vagina.  You have bleeding and need more than 1 pad an hour.  You feel like you are going to pass out (faint). This information is not intended to replace advice given to you by your health care provider. Make sure you discuss any questions you have with your health care provider. Document Released: 02/06/2008 Document Revised: 10/05/2015 Document Reviewed: 11/26/2012 Elsevier Interactive Patient Education  2017 [employer] Inc[employer]  

## 2016-08-15 NOTE — Progress Notes (Signed)
Subjective:    Patient ID: Melissa Cabrera, female    DOB: 1955-06-17, 61 y.o.   MRN: [id number]  HPI  Pt presents to the clinic today with c/o vaginal bleeding. This started about 1 week ago. The bleeding is associated with pelvic cramping. She reports the bleeding and cramping stopped 2 days ago.  She felt like she was having a menstrual cycle. She is postmenopausal x 5 years. Her last pap was in 2016-normal.  She also c/o a rash to her bilateral groins. This has been going on for months. The rash itches and burns. She was seen for the same in 2016. She has tried Ketoconazole cream and powder without any improvement.  She is also requesting a refill of her Prilosec today. She has been without it for a few months and she reports her reflux is terrible.   Review of Systems   Past Medical History:  Diagnosis Date  . Allergy   . Arthritis   . Chicken pox   . GERD (gastroesophageal reflux disease)   . Heart murmur   . Hyperlipidemia   . Hypertension   . Rheumatic fever     Current Outpatient Prescriptions  Medication Sig Dispense Refill  . amLODipine (NORVASC) 10 MG tablet Take 1 tablet (10 mg total) by mouth daily. 30 tablet 1  . Calcium Carbonate Antacid (ANTACID CALCIUM PO) Take 1 tablet by mouth.    . cetirizine (ZYRTEC) 10 MG tablet Take 10 mg by mouth daily.    . Cholecalciferol (VITAMIN D3) 400 UNITS CAPS Take 1 capsule by mouth daily.    . diphenhydrAMINE (BENADRYL) 25 mg capsule Take 50 mg by mouth at bedtime.    . fluticasone (FLONASE) 50 MCG/ACT nasal spray Place 2 sprays into both nostrils daily.    Marland Kitchen ibuprofen (ADVIL,MOTRIN) 200 MG tablet Take 800 mg by mouth daily.    Marland Kitchen omeprazole (PRILOSEC) 40 MG capsule Take 1 capsule (40 mg total) by mouth daily. NO MORE REFILLS WITHOUT ANNUAL EXAM 30 capsule 1  . vitamin C (ASCORBIC ACID) 500 MG tablet Take 500 mg by mouth daily.     No current facility-administered medications for this visit.     No Known Allergies  Family  History  Problem Relation Age of Onset  . Cancer Mother     skin  . Dementia Mother   . Cancer Sister     skin and liver  . Stroke Maternal Grandmother   . Diabetes Neg Hx   . Heart disease Neg Hx     Social History   Social History  . Marital status: Married    Spouse name: N/A  . Number of children: N/A  . Years of education: N/A   Occupational History  . Not on file.   Social History Main Topics  . Smoking status: Former Research scientist (life sciences)  . Smokeless tobacco: Never Used     Comment: quit in 1997  . Alcohol use No  . Drug use: No  . Sexual activity: Yes   Other Topics Concern  . Not on file   Social History Narrative  . No narrative on file     Constitutional: Denies fever, malaise, fatigue, headache or abrupt weight changes.  Gastrointestinal: Pt reports reflux and pelvic cramping. Denies abdominal pain, bloating, constipation, diarrhea or blood in the stool.  GU: Pt reports vaginal bleeding.  Denies urgency, frequency, pain with urination, burning sensation, blood in urine, odor or discharge. Skin: Pt reports rash of bilateral groin. Denies lesions or  ulcercations.    No other specific complaints in a complete review of systems (except as listed in HPI above).   Objective:   Physical Exam   BP 134/82   Pulse 70   Temp 98.1 F (36.7 C) (Oral)   Wt 245 lb 8 oz (111.4 kg)   SpO2 98%   BMI 45.40 kg/m  Wt Readings from Last 3 Encounters:  08/15/16 245 lb 8 oz (111.4 kg)  08/07/16 248 lb 12 oz (112.8 kg)  08/22/14 247 lb (112 kg)    General: Appears her stated age, obese in NAD. Skin: Maceration noted of bilateral groins. Abdomen: Soft and nontender. Normal bowel sounds. No distention or masses noted.  Pelvic: Normal female anatomy. Cervix with nodular lesion noted at 11 o'clock. Adnexa non palpable.   BMET    Component Value Date/Time   NA 142 05/16/2014 0844   NA 143 01/07/2014   K 4.4 05/16/2014 0844   CL 105 05/16/2014 0844   CO2 30 05/16/2014  0844   GLUCOSE 99 05/16/2014 0844   BUN 18 05/16/2014 0844   CREATININE 0.9 05/16/2014 0844   CALCIUM 9.5 05/16/2014 0844    Lipid Panel     Component Value Date/Time   CHOL 217 (H) 05/16/2014 0844   TRIG 278.0 (H) 05/16/2014 0844   HDL 45.70 05/16/2014 0844   CHOLHDL 5 05/16/2014 0844   VLDL 55.6 (H) 05/16/2014 0844   LDLCALC 95 01/07/2014    CBC    Component Value Date/Time   WBC 7.2 05/16/2014 0844   RBC 4.74 05/16/2014 0844   HGB 13.7 05/16/2014 0844   HCT 41.4 05/16/2014 0844   PLT 327.0 05/16/2014 0844   MCV 87.3 05/16/2014 0844   MCHC 33.2 05/16/2014 0844   RDW 13.3 05/16/2014 0844    Hgb A1C Lab Results  Component Value Date   HGBA1C 6.6 (H) 05/16/2014           Assessment & Plan:   Postmenopausal Bleeding with Cervical Lesion:  Pap smear today Urgent referral placed to GYN for further evaluation  GERD:  Prilosec refilled today  Rash of Groin:  eRx for Clotrimazole cream to affected area BID  RTC as needed or if symptoms persist or worsen BAITY, REGINA, NP

## 2016-08-16 ENCOUNTER — Encounter: Payer: Self-pay | Admitting: Obstetrics and Gynecology

## 2016-08-16 ENCOUNTER — Ambulatory Visit (INDEPENDENT_AMBULATORY_CARE_PROVIDER_SITE_OTHER): Payer: BLUE CROSS/BLUE SHIELD | Admitting: Obstetrics and Gynecology

## 2016-08-16 VITALS — BP 157/80 | HR 73 | Ht 61.0 in | Wt 245.9 lb

## 2016-08-16 DIAGNOSIS — N950 Postmenopausal bleeding: Secondary | ICD-10-CM

## 2016-08-16 DIAGNOSIS — N8110 Cystocele, unspecified: Secondary | ICD-10-CM | POA: Diagnosis not present

## 2016-08-16 NOTE — Progress Notes (Signed)
GYNECOLOGY CLINIC PROGRESS NOTE  Subjective:    Melissa Cabrera is a 61 y.o.  G3P3 post-menopausal female who presents for concerns regarding vaginal bleeding. She has been menopausal for 5 years. Has never been on HRT  Bleeding is described as flow about like a period and has occurred 1 times. Other menopausal symptoms include: none. Workup to date: none.  Last pap smear was 2 years ago.  Last mammogram was 2 years ago.   Menstrual History: OB History    Gravida Para Term Preterm AB Living   3 3       3    SAB TAB Ectopic Multiple Live Births           41      Menarche age: 33 or 2 No LMP recorded. Patient is postmenopausal.     OB History  Gravida Para Term Preterm AB Living  3 3       3   SAB TAB Ectopic Multiple Live Births          3    # Outcome Date GA Lbr Len/2nd Weight Sex Delivery Anes PTL Lv  3 Para      Vag-Spont   LIV  2 Para      Vag-Spont   LIV  1 Para      Vag-Spont   LIV      Past Medical History:  Diagnosis Date  . Allergy   . Arthritis   . Chicken pox   . GERD (gastroesophageal reflux disease)   . Heart murmur   . Hyperlipidemia   . Hypertension   . Rheumatic fever     Family History  Problem Relation Age of Onset  . Cancer Mother     skin  . Dementia Mother   . Cancer Sister     skin and liver  . Stroke Maternal Grandmother   . Diabetes Neg Hx   . Heart disease Neg Hx     History reviewed. No pertinent surgical history.    Social History   Social History  . Marital status: Married    Spouse name: N/A  . Number of children: N/A  . Years of education: N/A   Occupational History  . Not on file.   Social History Main Topics  . Smoking status: Former Research scientist (life sciences)  . Smokeless tobacco: Never Used     Comment: quit in 1997  . Alcohol use No  . Drug use: No  . Sexual activity: Yes    Birth control/ protection: None   Other Topics Concern  . Not on file   Social History Narrative  . No narrative on file    Current Outpatient  Prescriptions on File Prior to Visit  Medication Sig Dispense Refill  . amLODipine (NORVASC) 10 MG tablet Take 1 tablet (10 mg total) by mouth daily. 30 tablet 1  . Calcium Carbonate Antacid (ANTACID CALCIUM PO) Take 1 tablet by mouth.    . cetirizine (ZYRTEC) 10 MG tablet Take 10 mg by mouth daily.    . Cholecalciferol (VITAMIN D3) 400 UNITS CAPS Take 1 capsule by mouth daily.    . clotrimazole-betamethasone (LOTRISONE) cream Apply 1 application topically 2 (two) times daily. 30 g 0  . omeprazole (PRILOSEC) 40 MG capsule Take 1 capsule (40 mg total) by mouth daily. 30 capsule 1  . vitamin C (ASCORBIC ACID) 500 MG tablet Take 500 mg by mouth daily.     No current facility-administered medications on file prior to visit.  No Known Allergies   Review of Systems Pertinent items noted in HPI and remainder of comprehensive ROS otherwise negative.    Objective:    BP (!) 157/80 (BP Location: Left Arm, Patient Position: Sitting, Cuff Size: Large)   Pulse 73   Ht 5\' 1"  (1.549 m)   Wt 245 lb 14.4 oz (111.5 kg)   BMI 46.46 kg/m  General appearance: alert and no distress Neck: no adenopathy, no carotid bruit, no JVD, supple, symmetrical, trachea midline and thyroid not enlarged, symmetric, no tenderness/mass/nodules Abdomen: soft, non-tender; bowel sounds normal; no masses,  no organomegaly Pelvic: external genitalia normal, rectovaginal septum normal.  Vagina without discharge. Grade 2 cystocele present.  Cervix normal appearing, no lesions and no motion tenderness.  Uterus mobile, nontender, normal shape and size.  Adnexae non-palpable, nontender bilaterally.  Extremities: extremities normal, atraumatic, no cyanosis or edema Lymph nodes: Cervical, supraclavicular, and axillary nodes normal. Neurologic: Grossly normal   Assessment:    Postmenopausal bleeding   Cystocele  Plan:   - Blood tests: CBC with diff and TSH. - Discussed etiologies of postmenopausal bleeding, concern about  precancerous/hyperplasia or cancerous etiology (5 to 10% percent of cases). Also discussed role of unopposed estrogen exposure in leading to thickened or proliferative endometrium; and its possible correlation with endometrial hyperplasia/carcinoma.  Discussed that obesity is linked to endometrial pathology given that adipose cells produce extra estrogen (estrone) which can cause the endometrium to have a significant amount of estrogen exposure.  However, she was reassured that endometrial atrophy and endometrial polyps are the most common causes of postmenopausal bleeding.  Uterine bleeding in postmenopausal women is usually light and self-limited. Exclusion of cancer is the main objective; therefore, treatment is usually unnecessary once cancer has been excluded.  The primary goal in the diagnostic evaluation of postmenopausal women with uterine bleeding is to exclude malignancy; this can include endometrial biopsy and pelvic ultrasound.   Further diagnostic evaluation is indicated for recurrent or persistent bleeding.  - Endometrial biopsy - see separate procedure note. - Pelvic ultrasound ordered.   - Cystocele present, patient notes she has had a cystocele for several years.  Was seen for it in the past, has been doing conservative measures to manage symptoms.  -RTC in 2-3 weeks for ultrasound and f/u with MD    Endometrial Biopsy Procedure Note  The patient is positioned on the exam table in the dorsal lithotomy position. Bimanual exam confirms uterine position and size. A Graves speculum is placed into the vagina. A single toothed tenaculum is placed onto the anterior lip of the cervix. The pipette is placed into the endocervical canal and is advanced to the uterine fundus. Using a piston like technique, with vacuum created by withdrawing the stylus, the endometrial specimen is obtained and transferred to the biopsy container. Minimal bleeding is encountered. The procedure is well  tolerated.   Uterine Position: mid    Uterine Length: 9 cm   Uterine Specimen: Average  Post procedure instructions are given. The patient is scheduled for follow up appointment.   Rubie Maid, MD [facility]

## 2016-08-16 NOTE — Patient Instructions (Addendum)
Postmenopausal Bleeding Postmenopausal bleeding is any bleeding a woman has after she has entered into menopause. Menopause is the end of a woman's fertile years. After menopause, a woman no longer ovulates or has menstrual periods. Postmenopausal bleeding can be caused by various things. Any type of postmenopausal bleeding, even if it appears to be a typical menstrual period, is concerning. This should be evaluated by your health care provider. Any treatment will depend on the cause of the bleeding. Follow these instructions at home: Monitor your condition for any changes. The following actions may help to alleviate any discomfort you are experiencing:  Avoid the use of tampons and douches as directed by your health care provider.  Change your pads frequently.  Get regular pelvic exams and Pap tests.  Keep all follow-up appointments for diagnostic tests as directed by your health care provider. Contact a health care provider if:  Your bleeding lasts more than 1 week.  You have abdominal pain.  You have bleeding with sexual intercourse. Get help right away if:  You have a fever, chills, headache, dizziness, muscle aches, and bleeding.  You have severe pain with bleeding.  You are passing blood clots.  You have bleeding and need more than 1 pad an hour.  You feel faint. This information is not intended to replace advice given to you by your health care provider. Make sure you discuss any questions you have with your health care provider. Document Released: 08/07/2005 Document Revised: 10/05/2015 Document Reviewed: 11/26/2012 Elsevier Interactive Patient Education  2017 [place].       Endometrial Biopsy, Care After This sheet gives you information about how to care for yourself after your procedure. Your health care provider may also give you more specific instructions. If you have problems or questions, contact your health care provider. What can I expect after the  procedure? After the procedure, it is common to have:  Mild cramping.  A small amount of vaginal bleeding for a few days. This is normal. Follow these instructions at home:  Take over-the-counter and prescription medicines only as told by your health care provider.  Do not douche, use tampons, or have sexual intercourse until your health care provider approves.  Return to your normal activities as told by your health care provider. Ask your health care provider what activities are safe for you.  Follow instructions from your health care provider about any activity restrictions, such as restrictions on strenuous exercise or heavy lifting. Contact a health care provider if:  You have heavy bleeding, or bleed for longer than 2 days after the procedure.  You have bad smelling discharge from your vagina.  You have a fever or chills.  You have a burning sensation when urinating or you have difficulty urinating.  You have severe pain in your lower abdomen. Get help right away if:  You have severe cramps in your stomach or back.  You pass large blood clots.  Your bleeding increases.  You become weak or light-headed, or you pass out. Summary  After the procedure, it is common to have mild cramping and a small amount of vaginal bleeding for a few days.  Do not douche, use tampons, or have sexual intercourse until your health care provider approves.  Return to your normal activities as told by your health care provider. Ask your health care provider what activities are safe for you. This information is not intended to replace advice given to you by your health care provider. Make sure you discuss  any questions you have with your health care provider. Document Released: 02/17/2013 Document Revised: 05/15/2016 Document Reviewed: 05/15/2016 Elsevier Interactive Patient Education  2017 [employer].

## 2016-08-17 LAB — CBC
Hematocrit: 42.8 % (ref 34.0–46.6)
Hemoglobin: 14.5 g/dL (ref 11.1–15.9)
MCH: 29.2 pg (ref 26.6–33.0)
MCHC: 33.9 g/dL (ref 31.5–35.7)
MCV: 86 fL (ref 79–97)
PLATELETS: 297 10*3/uL (ref 150–379)
RBC: 4.97 x10E6/uL (ref 3.77–5.28)
RDW: 13.5 % (ref 12.3–15.4)
WBC: 9.9 10*3/uL (ref 3.4–10.8)

## 2016-08-17 LAB — TSH: TSH: 1.18 u[IU]/mL (ref 0.450–4.500)

## 2016-08-20 ENCOUNTER — Encounter: Payer: Self-pay | Admitting: Internal Medicine

## 2016-08-20 LAB — CYTOLOGY - PAP
Diagnosis: NEGATIVE
HPV: NOT DETECTED

## 2016-08-21 LAB — PATHOLOGY

## 2016-08-21 NOTE — Telephone Encounter (Signed)
Pap Smear results are in chart.

## 2016-08-22 ENCOUNTER — Telehealth: Payer: Self-pay

## 2016-08-22 NOTE — Telephone Encounter (Signed)
appt scheduled for 08/26/16 @11am 

## 2016-08-22 NOTE — Telephone Encounter (Signed)
-----   Message from Rubie Maid, MD sent at 08/22/2016  9:14 AM EDT ----- Please have patient schedule an appointment to discuss endometrial biopsy results.

## 2016-08-26 ENCOUNTER — Encounter: Payer: Self-pay | Admitting: Obstetrics and Gynecology

## 2016-08-26 ENCOUNTER — Encounter: Payer: BLUE CROSS/BLUE SHIELD | Admitting: Obstetrics and Gynecology

## 2016-08-26 ENCOUNTER — Ambulatory Visit (INDEPENDENT_AMBULATORY_CARE_PROVIDER_SITE_OTHER): Payer: BLUE CROSS/BLUE SHIELD | Admitting: Obstetrics and Gynecology

## 2016-08-26 VITALS — BP 150/80 | HR 73 | Ht 61.0 in | Wt 247.6 lb

## 2016-08-26 DIAGNOSIS — N950 Postmenopausal bleeding: Secondary | ICD-10-CM

## 2016-08-26 DIAGNOSIS — N8502 Endometrial intraepithelial neoplasia [EIN]: Secondary | ICD-10-CM | POA: Diagnosis not present

## 2016-08-26 NOTE — Progress Notes (Signed)
HPI:      [name] is a 61 y.o. G3P3 who LMP was No LMP recorded. Patient is postmenopausal.  Subjective:   She presents today To follow-up for endometrial biopsy for postmenopausal bleeding. She says her bleeding has now stopped.    Hx: The following portions of the patient's history were reviewed and updated as appropriate:              She  has a past medical history of Allergy; Arthritis; Chicken pox; GERD (gastroesophageal reflux disease); Heart murmur; Hyperlipidemia; Hypertension; and Rheumatic fever. She  does not have any pertinent problems on file. She  has no past surgical history on file. Her family history includes Cancer in her mother and sister; Dementia in her mother; Stroke in her maternal grandmother. She  reports that she has quit smoking. She has never used smokeless tobacco. She reports that she does not drink alcohol or use drugs. Current Outpatient Prescriptions on File Prior to Visit  Medication Sig Dispense Refill  . acetaminophen (TYLENOL) 325 MG tablet Take by mouth.    Marland Kitchen amLODipine (NORVASC) 10 MG tablet Take 1 tablet (10 mg total) by mouth daily. 30 tablet 1  . Ca Carbonate-Mag Hydroxide 1000-200 MG CHEW Chew by mouth.    . Calcium Carbonate Antacid (ANTACID CALCIUM PO) Take 1 tablet by mouth.    . cetirizine (ZYRTEC) 10 MG tablet Take 10 mg by mouth daily.    . Cholecalciferol (VITAMIN D3) 400 UNITS CAPS Take 1 capsule by mouth daily.    . clotrimazole-betamethasone (LOTRISONE) cream Apply 1 application topically 2 (two) times daily. 30 g 0  . diphenhydrAMINE (BENADRYL) 25 mg capsule Take by mouth.    . fluticasone (FLONASE) 50 MCG/ACT nasal spray Place into the nose.    . ibuprofen (ADVIL,MOTRIN) 200 MG tablet Take by mouth.    . Omega-3 Fatty Acids (FISH OIL PO) Take by mouth.    Marland Kitchen omeprazole (PRILOSEC) 40 MG capsule Take 1 capsule (40 mg total) by mouth daily. 30 capsule 1  . vitamin C (ASCORBIC ACID) 500 MG tablet Take 500 mg by mouth daily.      No current facility-administered medications on file prior to visit.          Review of Systems:  Review of Systems  Constitutional: Denied constitutional symptoms, night sweats, recent illness, fatigue, fever, insomnia and weight loss.  Eyes: Denied eye symptoms, eye pain, photophobia, vision change and visual disturbance.  Ears/Nose/Throat/Neck: Denied ear, nose, throat or neck symptoms, hearing loss, nasal discharge, sinus congestion and sore throat.  Cardiovascular: Denied cardiovascular symptoms, arrhythmia, chest pain/pressure, edema, exercise intolerance, orthopnea and palpitations.  Respiratory: Denied pulmonary symptoms, asthma, pleuritic pain, productive sputum, cough, dyspnea and wheezing.  Gastrointestinal: Denied, gastro-esophageal reflux, melena, nausea and vomiting.  Genitourinary: Denied genitourinary symptoms including symptomatic vaginal discharge, pelvic relaxation issues, and urinary complaints.  Musculoskeletal: Denied musculoskeletal symptoms, stiffness, swelling, muscle weakness and myalgia.  Dermatologic: Denied dermatology symptoms, rash and scar.  Neurologic: Denied neurology symptoms, dizziness, headache, neck pain and syncope.  Psychiatric: Denied psychiatric symptoms, anxiety and depression.  Endocrine: Denied endocrine symptoms including hot flashes and night sweats.   Meds:   Current Outpatient Prescriptions on File Prior to Visit  Medication Sig Dispense Refill  . acetaminophen (TYLENOL) 325 MG tablet Take by mouth.    Marland Kitchen amLODipine (NORVASC) 10 MG tablet Take 1 tablet (10 mg total) by mouth daily. 30 tablet 1  . Ca Carbonate-Mag Hydroxide 1000-200 MG CHEW Chew  by mouth.    . Calcium Carbonate Antacid (ANTACID CALCIUM PO) Take 1 tablet by mouth.    . cetirizine (ZYRTEC) 10 MG tablet Take 10 mg by mouth daily.    . Cholecalciferol (VITAMIN D3) 400 UNITS CAPS Take 1 capsule by mouth daily.    . clotrimazole-betamethasone (LOTRISONE) cream Apply 1  application topically 2 (two) times daily. 30 g 0  . diphenhydrAMINE (BENADRYL) 25 mg capsule Take by mouth.    . fluticasone (FLONASE) 50 MCG/ACT nasal spray Place into the nose.    . ibuprofen (ADVIL,MOTRIN) 200 MG tablet Take by mouth.    . Omega-3 Fatty Acids (FISH OIL PO) Take by mouth.    Marland Kitchen omeprazole (PRILOSEC) 40 MG capsule Take 1 capsule (40 mg total) by mouth daily. 30 capsule 1  . vitamin C (ASCORBIC ACID) 500 MG tablet Take 500 mg by mouth daily.     No current facility-administered medications on file prior to visit.     Objective:     Vitals:   08/26/16 1132  BP: (!) 150/80  Pulse: 73              Endometrial biopsy results reviewed directly with the patient.  Assessment:    G3P3 Patient Active Problem List   Diagnosis Date Noted  . Type 2 diabetes mellitus without complication ([place]) 56/70/1410  . Candidal intertrigo 05/16/2014  . Arthritis 04/12/2014  . Gastroesophageal reflux disease without esophagitis 04/12/2014  . Seasonal allergies 04/12/2014  . Essential hypertension 04/12/2014  . HLD (hyperlipidemia) 04/12/2014  . Psoriasis 04/12/2014  . Severe obesity (BMI >= 40) ([place]) 04/12/2014     1. Endometrial hyperplasia with atypia   2. Postmenopausal bleeding     Pathologists says he cannot rule out endometrial cancer and is suggesting more tissue be obtained by D&C.   Plan:            1.  I have discussed endometrial hyperplasia in detail with the patient. The difference between atypical complex endometrial hyperplasia and endometrial cancer was discussed in detail. The difference in management was discussed. GYN oncology versus simple hysterectomy was discussed. The rationale for D&C and obtaining additional tissue was discussed.   Patient will follow up for preop and to schedule a D&C with Dr. Marcelline Mates.       F/U  Return in about 3 days (around 08/29/2016). I spent 16 minutes with this patient of which greater than 50% was spent discussing  endometrial hyperplasia, endometrial cancer, surgery types, GYN oncology, need for D&C.  Finis Bud, M.D. 08/26/2016 12:31 PM

## 2016-08-29 ENCOUNTER — Encounter: Payer: Self-pay | Admitting: Obstetrics and Gynecology

## 2016-08-29 ENCOUNTER — Ambulatory Visit (INDEPENDENT_AMBULATORY_CARE_PROVIDER_SITE_OTHER): Payer: BLUE CROSS/BLUE SHIELD | Admitting: Obstetrics and Gynecology

## 2016-08-29 ENCOUNTER — Encounter: Payer: BLUE CROSS/BLUE SHIELD | Admitting: Obstetrics and Gynecology

## 2016-08-29 VITALS — BP 150/81 | HR 69 | Ht 61.0 in | Wt 247.4 lb

## 2016-08-29 DIAGNOSIS — N8502 Endometrial intraepithelial neoplasia [EIN]: Secondary | ICD-10-CM

## 2016-08-29 DIAGNOSIS — Z01818 Encounter for other preprocedural examination: Secondary | ICD-10-CM | POA: Diagnosis not present

## 2016-08-29 NOTE — H&P (Signed)
PRE-OPERATIVE HISTORY AND PHYSICAL EXAM  PCP:  Webb Silversmith, NP Subjective:   HPI:  Melissa Cabrera is a 61 y.o. G3P3.  No LMP recorded. Patient is postmenopausal.  She presents today for a pre-op discussion and PE.  She has the following symptoms:  Postmenopausal bleeding. 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  Review of Systems:   Constitutional: Denied constitutional symptoms, night sweats, recent illness, fatigue, fever, insomnia and weight loss.  Eyes: Denied eye symptoms, eye pain, photophobia, vision change and visual disturbance.  Ears/Nose/Throat/Neck: Denied ear, nose, throat or neck symptoms, hearing loss, nasal discharge, sinus congestion and sore throat.  Cardiovascular: Denied cardiovascular symptoms, arrhythmia, chest pain/pressure, edema, exercise intolerance, orthopnea and palpitations.  Respiratory: Denied pulmonary symptoms, asthma, pleuritic pain, productive sputum, cough, dyspnea and wheezing.  Gastrointestinal: Denied, gastro-esophageal reflux, melena, nausea and vomiting.  Genitourinary: See HPI for additional information.  Musculoskeletal: Denied musculoskeletal symptoms, stiffness, swelling, muscle weakness and myalgia.  Dermatologic: Denied dermatology symptoms, rash and scar.  Neurologic: Denied neurology symptoms, dizziness, headache, neck pain and syncope.  Psychiatric: Denied psychiatric symptoms, anxiety and depression.  Endocrine: Denied endocrine symptoms including hot flashes and night sweats.   OB History  Gravida Para Term Preterm AB Living  3 3       3   SAB TAB Ectopic Multiple Live Births          3    # Outcome Date GA Lbr Len/2nd Weight Sex Delivery Anes PTL Lv  3 Para      Vag-Spont   LIV  2 Para      Vag-Spont   LIV  1 Para      Vag-Spont   LIV      Past Medical History:  Diagnosis Date  . Allergy   . Arthritis   . Chicken pox   . GERD (gastroesophageal reflux disease)   . Heart murmur   . Hyperlipidemia   .  Hypertension   . Rheumatic fever     History reviewed. No pertinent surgical history.    SOCIAL HISTORY: History  Smoking Status  . Former Smoker  Smokeless Tobacco  . Never Used    Comment: quit in 1997   History  Alcohol Use No   History  Drug Use No    Family History  Problem Relation Age of Onset  . Cancer Mother     skin  . Dementia Mother   . Cancer Sister     skin and liver  . Stroke Maternal Grandmother   . Diabetes Neg Hx   . Heart disease Neg Hx     ALLERGIES:  Patient has no known allergies.  MEDS:   Current Outpatient Prescriptions on File Prior to Visit  Medication Sig Dispense Refill  . amLODipine (NORVASC) 10 MG tablet Take 1 tablet (10 mg total) by mouth daily. 30 tablet 1  . Ca Carbonate-Mag Hydroxide 1000-200 MG CHEW Chew by mouth.    . Calcium Carbonate Antacid (ANTACID CALCIUM PO) Take 1 tablet by mouth.    . cetirizine (ZYRTEC) 10 MG tablet Take 10 mg by mouth daily.    . Cholecalciferol (VITAMIN D3) 400 UNITS CAPS Take 1 capsule by mouth daily.    . clotrimazole-betamethasone (LOTRISONE) cream Apply 1 application topically 2 (two) times daily. 30 g 0  . diphenhydrAMINE (BENADRYL) 25 mg capsule Take by mouth.    . fluticasone (FLONASE) 50 MCG/ACT nasal spray Place into the nose.    . ibuprofen (ADVIL,MOTRIN)  200 MG tablet Take by mouth.    . Omega-3 Fatty Acids (FISH OIL PO) Take by mouth.    Marland Kitchen omeprazole (PRILOSEC) 40 MG capsule Take 1 capsule (40 mg total) by mouth daily. 30 capsule 1  . vitamin C (ASCORBIC ACID) 500 MG tablet Take 500 mg by mouth daily.     No current facility-administered medications on file prior to visit.     No orders of the defined types were placed in this encounter.    Physical examination BP (!) 150/81   Pulse 69   Ht 5\' 1"  (1.549 m)   Wt 247 lb 6 oz (112.2 kg)   BMI 46.74 kg/m   General NAD, Conversant  HEENT Atraumatic; Op clear with mmm.  Normo-cephalic. Pupils reactive. Anicteric sclerae    Thyroid/Neck Smooth without nodularity or enlargement. Normal ROM.  Neck Supple.  Skin No rashes, lesions or ulceration. Normal palpated skin turgor. No nodularity.  Breasts: No masses or discharge.  Symmetric.  No axillary adenopathy.  Lungs: Clear to auscultation.No rales or wheezes. Normal Respiratory effort, no retractions.  Heart: NSR.  No murmurs or rubs appreciated. No periferal edema  Abdomen: Soft.  Non-tender.  No masses.  No HSM. No hernia  Extremities: Moves all appropriately.  Normal ROM for age. No lymphadenopathy.  Neuro: Oriented to PPT.  Normal mood. Normal affect.     Pelvic:   Vulva: Normal appearance.  No lesions.  Vagina: No lesions or abnormalities noted.  Support: Cystocele   Urethra No masses tenderness or scarring.  Meatus Normal size without lesions or prolapse.  Cervix: Normal ectropion.  No lesions.  Anus: Normal exam.  No lesions.  Perineum: Normal exam.  No lesions.        Bimanual   Uterus: Normal size.  Non-tender.  Mobile.  AV.  Adnexae: No masses.  Non-tender to palpation.  Cul-de-sac: Negative for abnormality.   Exam limited by patient body habitus  Assessment:   G3P3 Patient Active Problem List   Diagnosis Date Noted  . Type 2 diabetes mellitus without complication ([place]) 56/21/3086  . Candidal intertrigo 05/16/2014  . Arthritis 04/12/2014  . Gastroesophageal reflux disease without esophagitis 04/12/2014  . Seasonal allergies 04/12/2014  . Essential hypertension 04/12/2014  . HLD (hyperlipidemia) 04/12/2014  . Psoriasis 04/12/2014  . Severe obesity (BMI >= 40) ([place]) 04/12/2014    1. Preop examination   2. Endometrial hyperplasia with atypia     Pathology unsure of endometrial atypical degree-concern for possibility of endometrial cancer.   Plan:   1.  Fractional  D&C

## 2016-08-29 NOTE — Progress Notes (Signed)
PRE-OPERATIVE HISTORY AND PHYSICAL EXAM  PCP:  Webb Silversmith, NP Subjective:   HPI:  Melissa Cabrera is a 61 y.o. G3P3.  No LMP recorded. Patient is postmenopausal.  She presents today for a pre-op discussion and PE.  She has the following symptoms:  Postmenopausal bleeding. 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  Review of Systems:   Constitutional: Denied constitutional symptoms, night sweats, recent illness, fatigue, fever, insomnia and weight loss.  Eyes: Denied eye symptoms, eye pain, photophobia, vision change and visual disturbance.  Ears/Nose/Throat/Neck: Denied ear, nose, throat or neck symptoms, hearing loss, nasal discharge, sinus congestion and sore throat.  Cardiovascular: Denied cardiovascular symptoms, arrhythmia, chest pain/pressure, edema, exercise intolerance, orthopnea and palpitations.  Respiratory: Denied pulmonary symptoms, asthma, pleuritic pain, productive sputum, cough, dyspnea and wheezing.  Gastrointestinal: Denied, gastro-esophageal reflux, melena, nausea and vomiting.  Genitourinary: See HPI for additional information.  Musculoskeletal: Denied musculoskeletal symptoms, stiffness, swelling, muscle weakness and myalgia.  Dermatologic: Denied dermatology symptoms, rash and scar.  Neurologic: Denied neurology symptoms, dizziness, headache, neck pain and syncope.  Psychiatric: Denied psychiatric symptoms, anxiety and depression.  Endocrine: Denied endocrine symptoms including hot flashes and night sweats.   OB History  Gravida Para Term Preterm AB Living  3 3       3   SAB TAB Ectopic Multiple Live Births          3    # Outcome Date GA Lbr Len/2nd Weight Sex Delivery Anes PTL Lv  3 Para      Vag-Spont   LIV  2 Para      Vag-Spont   LIV  1 Para      Vag-Spont   LIV      Past Medical History:  Diagnosis Date  . Allergy   . Arthritis   . Chicken pox   . GERD (gastroesophageal reflux disease)   . Heart murmur   . Hyperlipidemia   .  Hypertension   . Rheumatic fever     History reviewed. No pertinent surgical history.    SOCIAL HISTORY: History  Smoking Status  . Former Smoker  Smokeless Tobacco  . Never Used    Comment: quit in 1997   History  Alcohol Use No   History  Drug Use No    Family History  Problem Relation Age of Onset  . Cancer Mother     skin  . Dementia Mother   . Cancer Sister     skin and liver  . Stroke Maternal Grandmother   . Diabetes Neg Hx   . Heart disease Neg Hx     ALLERGIES:  Patient has no known allergies.  MEDS:   Current Outpatient Prescriptions on File Prior to Visit  Medication Sig Dispense Refill  . amLODipine (NORVASC) 10 MG tablet Take 1 tablet (10 mg total) by mouth daily. 30 tablet 1  . Ca Carbonate-Mag Hydroxide 1000-200 MG CHEW Chew by mouth.    . Calcium Carbonate Antacid (ANTACID CALCIUM PO) Take 1 tablet by mouth.    . cetirizine (ZYRTEC) 10 MG tablet Take 10 mg by mouth daily.    . Cholecalciferol (VITAMIN D3) 400 UNITS CAPS Take 1 capsule by mouth daily.    . clotrimazole-betamethasone (LOTRISONE) cream Apply 1 application topically 2 (two) times daily. 30 g 0  . diphenhydrAMINE (BENADRYL) 25 mg capsule Take by mouth.    . fluticasone (FLONASE) 50 MCG/ACT nasal spray Place into the nose.    . ibuprofen (ADVIL,MOTRIN) 200  MG tablet Take by mouth.    . Omega-3 Fatty Acids (FISH OIL PO) Take by mouth.    Marland Kitchen omeprazole (PRILOSEC) 40 MG capsule Take 1 capsule (40 mg total) by mouth daily. 30 capsule 1  . vitamin C (ASCORBIC ACID) 500 MG tablet Take 500 mg by mouth daily.     No current facility-administered medications on file prior to visit.     No orders of the defined types were placed in this encounter.    Physical examination BP (!) 150/81   Pulse 69   Ht 5\' 1"  (1.549 m)   Wt 247 lb 6 oz (112.2 kg)   BMI 46.74 kg/m   General NAD, Conversant  HEENT Atraumatic; Op clear with mmm.  Normo-cephalic. Pupils reactive. Anicteric sclerae    Thyroid/Neck Smooth without nodularity or enlargement. Normal ROM.  Neck Supple.  Skin No rashes, lesions or ulceration. Normal palpated skin turgor. No nodularity.  Breasts: No masses or discharge.  Symmetric.  No axillary adenopathy.  Lungs: Clear to auscultation.No rales or wheezes. Normal Respiratory effort, no retractions.  Heart: NSR.  No murmurs or rubs appreciated. No periferal edema  Abdomen: Soft.  Non-tender.  No masses.  No HSM. No hernia  Extremities: Moves all appropriately.  Normal ROM for age. No lymphadenopathy.  Neuro: Oriented to PPT.  Normal mood. Normal affect.     Pelvic:   Vulva: Normal appearance.  No lesions.  Vagina: No lesions or abnormalities noted.  Support: Cystocele   Urethra No masses tenderness or scarring.  Meatus Normal size without lesions or prolapse.  Cervix: Normal ectropion.  No lesions.  Anus: Normal exam.  No lesions.  Perineum: Normal exam.  No lesions.        Bimanual   Uterus: Normal size.  Non-tender.  Mobile.  AV.  Adnexae: No masses.  Non-tender to palpation.  Cul-de-sac: Negative for abnormality.   Exam limited by patient body habitus  Assessment:   G3P3 Patient Active Problem List   Diagnosis Date Noted  . Type 2 diabetes mellitus without complication ([place]) 27/25/3664  . Candidal intertrigo 05/16/2014  . Arthritis 04/12/2014  . Gastroesophageal reflux disease without esophagitis 04/12/2014  . Seasonal allergies 04/12/2014  . Essential hypertension 04/12/2014  . HLD (hyperlipidemia) 04/12/2014  . Psoriasis 04/12/2014  . Severe obesity (BMI >= 40) ([place]) 04/12/2014    1. Preop examination   2. Endometrial hyperplasia with atypia     Pathology unsure of endometrial atypical degree-concern for possibility of endometrial cancer.   Plan:   1.  Fractional  D&C  Pre-op discussions regarding Risks and Benefits of her scheduled surgery.  D&C/E The procedure and the risks and benefits of dilation and curettage/evacuation  have been explained to the patient.  The specific risks of bleeding, infection, anesthesia, uterine perforation, and damage to bowel or bladder  have been specifically discussed.  I have answered all of her questions and I believe that she has an adequate and informed understanding of this procedure.   Finis Bud, M.D. 08/29/2016 9:24 AM

## 2016-08-30 ENCOUNTER — Encounter: Payer: Self-pay | Admitting: Internal Medicine

## 2016-09-04 ENCOUNTER — Encounter: Payer: BLUE CROSS/BLUE SHIELD | Admitting: Obstetrics and Gynecology

## 2016-09-04 ENCOUNTER — Other Ambulatory Visit: Payer: BLUE CROSS/BLUE SHIELD

## 2016-09-05 ENCOUNTER — Other Ambulatory Visit: Payer: Self-pay | Admitting: Internal Medicine

## 2016-09-05 ENCOUNTER — Encounter: Payer: Self-pay | Admitting: Internal Medicine

## 2016-09-05 MED ORDER — CLOTRIMAZOLE-BETAMETHASONE 1-0.05 % EX CREA: 1.0000 "application " | TOPICAL_CREAM | Freq: Two times a day (BID) | CUTANEOUS | 0 refills | Status: DC

## 2016-09-06 ENCOUNTER — Encounter: Payer: BLUE CROSS/BLUE SHIELD | Admitting: Obstetrics and Gynecology

## 2016-09-06 ENCOUNTER — Other Ambulatory Visit: Payer: BLUE CROSS/BLUE SHIELD

## 2016-09-09 ENCOUNTER — Encounter: Payer: Self-pay | Admitting: Obstetrics and Gynecology

## 2016-09-11 ENCOUNTER — Encounter
Admission: RE | Admit: 2016-09-11 | Discharge: 2016-09-11 | Disposition: A | Discharge status: Home / Self Care | Payer: BLUE CROSS/BLUE SHIELD | Source: Ambulatory Visit | Attending: Obstetrics and Gynecology | Admitting: Obstetrics and Gynecology

## 2016-09-11 DIAGNOSIS — I10 Essential (primary) hypertension: Secondary | ICD-10-CM | POA: Insufficient documentation

## 2016-09-11 DIAGNOSIS — Z01810 Encounter for preprocedural cardiovascular examination: Secondary | ICD-10-CM | POA: Insufficient documentation

## 2016-09-11 DIAGNOSIS — Z01812 Encounter for preprocedural laboratory examination: Secondary | ICD-10-CM | POA: Insufficient documentation

## 2016-09-11 NOTE — Patient Instructions (Signed)
  Your procedure is scheduled on: 09-16-16 (Monday) Report to Same Day Surgery 2nd floor medical mall [facility] Entrance-take elevator on left to 2nd floor.  Check in with surgery information desk.) To find out your arrival time please call [phone] between 1PM - 3PM on 09-13-16 (Friday)  Remember: Instructions that are not followed completely may result in serious medical risk, up to and including death, or upon the discretion of your surgeon and anesthesiologist your surgery may need to be rescheduled.    _x___ 1. Do not eat food or drink liquids after midnight. No gum chewing or hard candies.     __x__ 2. No Alcohol for 24 hours before or after surgery.   __x__3. No Smoking for 24 prior to surgery.   ____  4. Bring all medications with you on the day of surgery if instructed.    __x__ 5. Notify your doctor if there is any change in your medical condition     (cold, fever, infections).     Do not wear jewelry, make-up, hairpins, clips or nail polish.  Do not wear lotions, powders, or perfumes. You may wear deodorant.  Do not shave 48 hours prior to surgery. Men may shave face and neck.  Do not bring valuables to the hospital.    [facility] is not responsible for any belongings or valuables.               Contacts, dentures or bridgework may not be worn into surgery.  Leave your suitcase in the car. After surgery it may be brought to your room.  For patients admitted to the hospital, discharge time is determined by your  treatment team.   Patients discharged the day of surgery will not be allowed to drive home.  You will need someone to drive you home and stay with you the night of your procedure.    Please read over the following fact sheets that you were given:     _x___ Take anti-hypertensive (unless it includes a diuretic), cardiac, seizure, asthma,     anti-reflux and psychiatric medicines. These include:  1. PRILOSEC (OMEPRAZOLE)  2. TAKE AN EXTRA PRILOSEC Sunday NIGHT  BEFORE BED  3.  4.  5.  6.  ____Fleets enema or Magnesium Citrate as directed.   ____ Use CHG Soap or sage wipes as directed on instruction sheet   ____ Use inhalers on the day of surgery and bring to hospital day of surgery  ____ Stop Metformin and Janumet 2 days prior to surgery.    ____ Take 1/2 of usual insulin dose the night before surgery and none on the morning surgery.   ____ Follow recommendations from Cardiologist, Pulmonologist or PCP regarding stopping Aspirin, Coumadin, Pllavix ,Eliquis, Effient, or Pradaxa, and Pletal.  X____Stop Anti-inflammatories such as Advil, Aleve, IBUPROFEN, Motrin, Naproxen, Naprosyn, Goodies powders or aspirin products NOW-OK to take Tylenol    _x___ Stop supplements until after surgery-STOP FISH OIL NOW   ____ Bring C-Pap to the hospital.

## 2016-09-12 ENCOUNTER — Encounter
Admission: RE | Admit: 2016-09-12 | Discharge: 2016-09-12 | Disposition: A | Discharge status: Home / Self Care | Payer: BLUE CROSS/BLUE SHIELD | Source: Ambulatory Visit | Attending: Obstetrics and Gynecology | Admitting: Obstetrics and Gynecology

## 2016-09-12 DIAGNOSIS — Z01812 Encounter for preprocedural laboratory examination: Secondary | ICD-10-CM | POA: Diagnosis present

## 2016-09-12 DIAGNOSIS — I10 Essential (primary) hypertension: Secondary | ICD-10-CM | POA: Diagnosis not present

## 2016-09-12 DIAGNOSIS — Z01810 Encounter for preprocedural cardiovascular examination: Secondary | ICD-10-CM | POA: Diagnosis present

## 2016-09-12 LAB — CBC
HEMATOCRIT: 42.2 % (ref 35.0–47.0)
Hemoglobin: 14.1 g/dL (ref 12.0–16.0)
MCH: 28.8 pg (ref 26.0–34.0)
MCHC: 33.4 g/dL (ref 32.0–36.0)
MCV: 86.1 fL (ref 80.0–100.0)
PLATELETS: 328 10*3/uL (ref 150–440)
RBC: 4.89 MIL/uL (ref 3.80–5.20)
RDW: 13.6 % (ref 11.5–14.5)
WBC: 8 10*3/uL (ref 3.6–11.0)

## 2016-09-12 LAB — BASIC METABOLIC PANEL
ANION GAP: 8 (ref 5–15)
BUN: 26 mg/dL — ABNORMAL HIGH (ref 6–20)
CALCIUM: 9.4 mg/dL (ref 8.9–10.3)
CO2: 29 mmol/L (ref 22–32)
Chloride: 104 mmol/L (ref 101–111)
Creatinine, Ser: 0.86 mg/dL (ref 0.44–1.00)
GLUCOSE: 139 mg/dL — AB (ref 65–99)
POTASSIUM: 4.2 mmol/L (ref 3.5–5.1)
Sodium: 141 mmol/L (ref 135–145)

## 2016-09-12 LAB — TYPE AND SCREEN
ABO/RH(D): A NEG
Antibody Screen: NEGATIVE

## 2016-09-13 NOTE — H&P (Signed)
PRE-OPERATIVE HISTORY AND PHYSICAL EXAM  PCP:  Webb Silversmith, NP Subjective:   HPI:  Melissa Cabrera is a 61 y.o. G3P3.  No LMP recorded. Patient is postmenopausal.  She presents today for a pre-op discussion and PE.  She has the following symptoms:  Postmenopausal bleeding. 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  Review of Systems:   Constitutional: Denied constitutional symptoms, night sweats, recent illness, fatigue, fever, insomnia and weight loss.  Eyes: Denied eye symptoms, eye pain, photophobia, vision change and visual disturbance.  Ears/Nose/Throat/Neck: Denied ear, nose, throat or neck symptoms, hearing loss, nasal discharge, sinus congestion and sore throat.  Cardiovascular: Denied cardiovascular symptoms, arrhythmia, chest pain/pressure, edema, exercise intolerance, orthopnea and palpitations.  Respiratory: Denied pulmonary symptoms, asthma, pleuritic pain, productive sputum, cough, dyspnea and wheezing.  Gastrointestinal: Denied, gastro-esophageal reflux, melena, nausea and vomiting.  Genitourinary: See HPI for additional information.  Musculoskeletal: Denied musculoskeletal symptoms, stiffness, swelling, muscle weakness and myalgia.  Dermatologic: Denied dermatology symptoms, rash and scar.  Neurologic: Denied neurology symptoms, dizziness, headache, neck pain and syncope.  Psychiatric: Denied psychiatric symptoms, anxiety and depression.  Endocrine: Denied endocrine symptoms including hot flashes and night sweats.                   OB History  Gravida Para Term Preterm AB Living  3 3       3   SAB TAB Ectopic Multiple Live Births          3    # Outcome Date GA Lbr Len/2nd Weight Sex Delivery Anes PTL Lv  3 Para      Vag-Spont   LIV  2 Para      Vag-Spont   LIV  1 Para      Vag-Spont   LIV          Past Medical History:  Diagnosis Date  . Allergy   . Arthritis   .  Chicken pox   . GERD (gastroesophageal reflux disease)   . Heart murmur   . Hyperlipidemia   . Hypertension   . Rheumatic fever     History reviewed. No pertinent surgical history.    SOCIAL HISTORY: History  Smoking Status  . Former Smoker  Smokeless Tobacco  . Never Used    Comment: quit in 1997        History   Alcohol Use No       History  Drug Use No          Family History  Problem Relation Age of Onset  . Cancer Mother     skin  . Dementia Mother   . Cancer Sister     skin and liver  . Stroke Maternal Grandmother   . Diabetes Neg Hx   . Heart disease Neg Hx     ALLERGIES:  Patient has no known allergies.  MEDS:                    Current Outpatient Prescriptions on File Prior to Visit  Medication Sig Dispense Refill  . amLODipine (NORVASC) 10 MG tablet Take 1 tablet (10 mg total) by mouth daily. 30 tablet 1  . Ca Carbonate-Mag Hydroxide 1000-200 MG CHEW Chew by mouth.    Marland Kitchen  Calcium Carbonate Antacid (ANTACID CALCIUM PO) Take 1 tablet by mouth.    . cetirizine (ZYRTEC) 10 MG tablet Take 10 mg by mouth daily.    . Cholecalciferol (VITAMIN D3) 400 UNITS CAPS Take 1 capsule by mouth daily.    . clotrimazole-betamethasone (LOTRISONE) cream Apply 1 application topically 2 (two) times daily. 30 g 0  . diphenhydrAMINE (BENADRYL) 25 mg capsule Take by mouth.    . fluticasone (FLONASE) 50 MCG/ACT nasal spray Place into the nose.    . ibuprofen (ADVIL,MOTRIN) 200 MG tablet Take by mouth.    . Omega-3 Fatty Acids (FISH OIL PO) Take by mouth.    Marland Kitchen omeprazole (PRILOSEC) 40 MG capsule Take 1 capsule (40 mg total) by mouth daily. 30 capsule 1  . vitamin C (ASCORBIC ACID) 500 MG tablet Take 500 mg by mouth daily.     No current facility-administered medications on file prior to visit.     No orders of the defined types were placed in this encounter.    Physical examination BP (!) 150/81   Pulse 69   Ht 5\' 1"   (1.549 m)   Wt 247 lb 6 oz (112.2 kg)   BMI 46.74 kg/m   General NAD, Conversant  HEENT Atraumatic; Op clear with mmm.  Normo-cephalic. Pupils reactive. Anicteric sclerae  Thyroid/Neck Smooth without nodularity or enlargement. Normal ROM.  Neck Supple.  Skin No rashes, lesions or ulceration. Normal palpated skin turgor. No nodularity.  Breasts: No masses or discharge.  Symmetric.  No axillary adenopathy.  Lungs: Clear to auscultation.No rales or wheezes. Normal Respiratory effort, no retractions.  Heart: NSR.  No murmurs or rubs appreciated. No periferal edema  Abdomen: Soft.  Non-tender.  No masses.  No HSM. No hernia  Extremities: Moves all appropriately.  Normal ROM for age. No lymphadenopathy.  Neuro: Oriented to PPT.  Normal mood. Normal affect.             Pelvic:   Vulva: Normal appearance.  No lesions.   Vagina: No lesions or abnormalities noted.   Support: Cystocele    Urethra No masses tenderness or scarring.   Meatus Normal size without lesions or prolapse.   Cervix: Normal ectropion.  No lesions.   Anus: Normal exam.  No lesions.   Perineum: Normal exam.  No lesions.         Bimanual   Uterus: Normal size.  Non-tender.  Mobile.  AV.    Adnexae: No masses.  Non-tender to palpation.    Cul-de-sac: Negative for abnormality.     Exam limited by patient body habitus  Assessment:   G3P3     Patient Active Problem List   Diagnosis Date Noted  . Type 2 diabetes mellitus without complication ([place]) 92/05/69  . Candidal intertrigo 05/16/2014  . Arthritis 04/12/2014  . Gastroesophageal reflux disease without esophagitis 04/12/2014  . Seasonal allergies 04/12/2014  . Essential hypertension 04/12/2014  . HLD (hyperlipidemia) 04/12/2014  . Psoriasis 04/12/2014  . Severe obesity (BMI >= 40) ([place]) 04/12/2014    1. Preop examination   2. Endometrial hyperplasia with atypia               Pathology unsure of endometrial atypical degree-concern for possibility  of endometrial cancer.             Plan:   1.  Fractional  D&C  Pre-op discussions regarding Risks and Benefits of her scheduled surgery.  D&C/E The procedure and the risks and benefits of dilation and curettage/evacuation  have been explained to the patient.  The specific risks of bleeding, infection, anesthesia, uterine perforation, and damage to bowel or bladder  have been specifically discussed.  I have answered all of her questions and I believe that she has an adequate and informed understanding of this procedure.  Finis Bud MD PERFORMED - 08-29-16

## 2016-09-16 ENCOUNTER — Ambulatory Visit: Payer: BLUE CROSS/BLUE SHIELD | Admitting: Anesthesiology

## 2016-09-16 ENCOUNTER — Encounter: Payer: Self-pay | Admitting: *Deleted

## 2016-09-16 ENCOUNTER — Encounter
Admission: RE | Disposition: A | Discharge status: Home / Self Care | Payer: Self-pay | Source: Ambulatory Visit | Attending: Obstetrics and Gynecology

## 2016-09-16 ENCOUNTER — Ambulatory Visit
Admission: RE | Admit: 2016-09-16 | Discharge: 2016-09-16 | Disposition: A | Discharge status: Home / Self Care | Payer: BLUE CROSS/BLUE SHIELD | Source: Ambulatory Visit | Attending: Obstetrics and Gynecology | Admitting: Obstetrics and Gynecology

## 2016-09-16 DIAGNOSIS — N8502 Endometrial intraepithelial neoplasia [EIN]: Secondary | ICD-10-CM | POA: Insufficient documentation

## 2016-09-16 DIAGNOSIS — Z808 Family history of malignant neoplasm of other organs or systems: Secondary | ICD-10-CM | POA: Diagnosis not present

## 2016-09-16 DIAGNOSIS — Z79899 Other long term (current) drug therapy: Secondary | ICD-10-CM | POA: Insufficient documentation

## 2016-09-16 DIAGNOSIS — I10 Essential (primary) hypertension: Secondary | ICD-10-CM | POA: Diagnosis not present

## 2016-09-16 DIAGNOSIS — Z87891 Personal history of nicotine dependence: Secondary | ICD-10-CM | POA: Diagnosis not present

## 2016-09-16 DIAGNOSIS — M1990 Unspecified osteoarthritis, unspecified site: Secondary | ICD-10-CM | POA: Insufficient documentation

## 2016-09-16 DIAGNOSIS — N858 Other specified noninflammatory disorders of uterus: Secondary | ICD-10-CM | POA: Diagnosis not present

## 2016-09-16 DIAGNOSIS — Z6841 Body Mass Index (BMI) 40.0 and over, adult: Secondary | ICD-10-CM | POA: Diagnosis not present

## 2016-09-16 DIAGNOSIS — E785 Hyperlipidemia, unspecified: Secondary | ICD-10-CM | POA: Insufficient documentation

## 2016-09-16 DIAGNOSIS — E119 Type 2 diabetes mellitus without complications: Secondary | ICD-10-CM | POA: Insufficient documentation

## 2016-09-16 DIAGNOSIS — N950 Postmenopausal bleeding: Secondary | ICD-10-CM | POA: Diagnosis present

## 2016-09-16 DIAGNOSIS — K219 Gastro-esophageal reflux disease without esophagitis: Secondary | ICD-10-CM | POA: Insufficient documentation

## 2016-09-16 DIAGNOSIS — Z823 Family history of stroke: Secondary | ICD-10-CM | POA: Insufficient documentation

## 2016-09-16 HISTORY — PX: DILATION AND CURETTAGE OF UTERUS: SHX78

## 2016-09-16 LAB — ABO/RH: ABO/RH(D): A NEG

## 2016-09-16 SURGERY — DILATION AND CURETTAGE
Anesthesia: General | Site: Vagina | Wound class: Clean Contaminated

## 2016-09-16 MED ORDER — ONDANSETRON HCL 4 MG/2ML IJ SOLN
INTRAMUSCULAR | Status: AC
  Filled 2016-09-16: qty 2

## 2016-09-16 MED ORDER — ONDANSETRON HCL 4 MG/2ML IJ SOLN: 4.0000 mg | Freq: Once | INTRAMUSCULAR | Status: DC | PRN

## 2016-09-16 MED ORDER — MIDAZOLAM HCL 2 MG/2ML IJ SOLN
INTRAMUSCULAR | Status: AC
  Filled 2016-09-16: qty 2

## 2016-09-16 MED ORDER — FENTANYL CITRATE (PF) 100 MCG/2ML IJ SOLN
INTRAMUSCULAR | Status: AC
  Administered 2016-09-16: 25 ug via INTRAVENOUS
  Filled 2016-09-16: qty 2

## 2016-09-16 MED ORDER — ONDANSETRON HCL 4 MG/2ML IJ SOLN
INTRAMUSCULAR | Status: DC | PRN
  Administered 2016-09-16: 4 mg via INTRAVENOUS

## 2016-09-16 MED ORDER — FENTANYL CITRATE (PF) 100 MCG/2ML IJ SOLN
INTRAMUSCULAR | Status: AC
  Filled 2016-09-16: qty 2

## 2016-09-16 MED ORDER — LIDOCAINE HCL 2 % IJ SOLN
INTRAMUSCULAR | Status: AC
  Filled 2016-09-16: qty 10

## 2016-09-16 MED ORDER — PROPOFOL 10 MG/ML IV BOLUS
INTRAVENOUS | Status: DC | PRN
  Administered 2016-09-16: 200 mg via INTRAVENOUS

## 2016-09-16 MED ORDER — FENTANYL CITRATE (PF) 100 MCG/2ML IJ SOLN
INTRAMUSCULAR | Status: DC | PRN
  Administered 2016-09-16: 50 ug via INTRAVENOUS

## 2016-09-16 MED ORDER — LACTATED RINGERS IV SOLN: INTRAVENOUS | Status: DC

## 2016-09-16 MED ORDER — LACTATED RINGERS IV SOLN
INTRAVENOUS | Status: DC
  Administered 2016-09-16 (×2): via INTRAVENOUS

## 2016-09-16 MED ORDER — KETOROLAC TROMETHAMINE 30 MG/ML IJ SOLN
30.0000 mg | Freq: Once | INTRAMUSCULAR | Status: AC
  Administered 2016-09-16: 30 mg via INTRAVENOUS

## 2016-09-16 MED ORDER — MIDAZOLAM HCL 2 MG/2ML IJ SOLN
INTRAMUSCULAR | Status: DC | PRN
  Administered 2016-09-16: 2 mg via INTRAVENOUS

## 2016-09-16 MED ORDER — LIDOCAINE HCL (CARDIAC) 20 MG/ML IV SOLN
INTRAVENOUS | Status: DC | PRN
  Administered 2016-09-16: 100 mg via INTRAVENOUS

## 2016-09-16 MED ORDER — FENTANYL CITRATE (PF) 100 MCG/2ML IJ SOLN
25.0000 ug | INTRAMUSCULAR | Status: DC | PRN
  Administered 2016-09-16 (×4): 25 ug via INTRAVENOUS

## 2016-09-16 SURGICAL SUPPLY — 12 items
BAG INFUSER PRESSURE 100CC (MISCELLANEOUS) ×4 IMPLANT
CATH ROBINSON RED A/P 16FR (CATHETERS) ×4 IMPLANT
GLOVE BIO SURGEON STRL SZ8 (GLOVE) ×4 IMPLANT
GOWN STRL REUS W/ TWL LRG LVL3 (GOWN DISPOSABLE) ×2 IMPLANT
GOWN STRL REUS W/ TWL XL LVL3 (GOWN DISPOSABLE) ×2 IMPLANT
GOWN STRL REUS W/TWL LRG LVL3 (GOWN DISPOSABLE) ×2
GOWN STRL REUS W/TWL XL LVL3 (GOWN DISPOSABLE) ×2
IV LACTATED RINGERS 1000ML (IV SOLUTION) ×4 IMPLANT
KIT RM TURNOVER CYSTO AR (KITS) ×4 IMPLANT
PACK DNC HYST (MISCELLANEOUS) ×4 IMPLANT
PAD OB MATERNITY 4.3X12.25 (PERSONAL CARE ITEMS) ×4 IMPLANT
PAD PREP 24X41 OB/GYN DISP (PERSONAL CARE ITEMS) ×4 IMPLANT

## 2016-09-16 NOTE — Anesthesia Post-op Follow-up Note (Cosign Needed)
Anesthesia QCDR form completed.        

## 2016-09-16 NOTE — Anesthesia Preprocedure Evaluation (Signed)
Anesthesia Evaluation  Patient identified by MRN, date of birth, ID band Patient awake    Reviewed: Allergy & Precautions, NPO status , Patient's Chart, lab work & pertinent test results  Airway Mallampati: II       Dental  (+) Teeth Intact, Upper Dentures   Pulmonary neg pulmonary ROS, former smoker,    breath sounds clear to auscultation       Cardiovascular Exercise Tolerance: Good hypertension, Pt. on medications  Rhythm:Regular Rate:Normal     Neuro/Psych negative neurological ROS     GI/Hepatic Neg liver ROS, GERD  ,  Endo/Other  diabetes, Type 2  Renal/GU negative Renal ROS     Musculoskeletal   Abdominal Normal abdominal exam  (+)   Peds negative pediatric ROS (+)  Hematology   Anesthesia Other Findings   Reproductive/Obstetrics                             Anesthesia Physical Anesthesia Plan  ASA: II  Anesthesia Plan: General   Post-op Pain Management:    Induction: Intravenous  Airway Management Planned: LMA  Additional Equipment:   Intra-op Plan:   Post-operative Plan: Extubation in OR  Informed Consent: I have reviewed the patients History and Physical, chart, labs and discussed the procedure including the risks, benefits and alternatives for the proposed anesthesia with the patient or authorized representative who has indicated his/her understanding and acceptance.     Plan Discussed with: CRNA  Anesthesia Plan Comments:         Anesthesia Quick Evaluation

## 2016-09-16 NOTE — H&P (View-Only) (Signed)
PRE-OPERATIVE HISTORY AND PHYSICAL EXAM  PCP:  Webb Silversmith, NP Subjective:   HPI:  Melissa Cabrera is a 61 y.o. G3P3.  No LMP recorded. Patient is postmenopausal.  She presents today for a pre-op discussion and PE.  She has the following symptoms:  Postmenopausal bleeding. 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  Review of Systems:   Constitutional: Denied constitutional symptoms, night sweats, recent illness, fatigue, fever, insomnia and weight loss.  Eyes: Denied eye symptoms, eye pain, photophobia, vision change and visual disturbance.  Ears/Nose/Throat/Neck: Denied ear, nose, throat or neck symptoms, hearing loss, nasal discharge, sinus congestion and sore throat.  Cardiovascular: Denied cardiovascular symptoms, arrhythmia, chest pain/pressure, edema, exercise intolerance, orthopnea and palpitations.  Respiratory: Denied pulmonary symptoms, asthma, pleuritic pain, productive sputum, cough, dyspnea and wheezing.  Gastrointestinal: Denied, gastro-esophageal reflux, melena, nausea and vomiting.  Genitourinary: See HPI for additional information.  Musculoskeletal: Denied musculoskeletal symptoms, stiffness, swelling, muscle weakness and myalgia.  Dermatologic: Denied dermatology symptoms, rash and scar.  Neurologic: Denied neurology symptoms, dizziness, headache, neck pain and syncope.  Psychiatric: Denied psychiatric symptoms, anxiety and depression.  Endocrine: Denied endocrine symptoms including hot flashes and night sweats.   OB History  Gravida Para Term Preterm AB Living  3 3       3   SAB TAB Ectopic Multiple Live Births          3    # Outcome Date GA Lbr Len/2nd Weight Sex Delivery Anes PTL Lv  3 Para      Vag-Spont   LIV  2 Para      Vag-Spont   LIV  1 Para      Vag-Spont   LIV      Past Medical History:  Diagnosis Date  . Allergy   . Arthritis   . Chicken pox   . GERD (gastroesophageal reflux disease)   . Heart murmur   . Hyperlipidemia   .  Hypertension   . Rheumatic fever     History reviewed. No pertinent surgical history.    SOCIAL HISTORY: History  Smoking Status  . Former Smoker  Smokeless Tobacco  . Never Used    Comment: quit in 1997   History  Alcohol Use No   History  Drug Use No    Family History  Problem Relation Age of Onset  . Cancer Mother     skin  . Dementia Mother   . Cancer Sister     skin and liver  . Stroke Maternal Grandmother   . Diabetes Neg Hx   . Heart disease Neg Hx     ALLERGIES:  Patient has no known allergies.  MEDS:   Current Outpatient Prescriptions on File Prior to Visit  Medication Sig Dispense Refill  . amLODipine (NORVASC) 10 MG tablet Take 1 tablet (10 mg total) by mouth daily. 30 tablet 1  . Ca Carbonate-Mag Hydroxide 1000-200 MG CHEW Chew by mouth.    . Calcium Carbonate Antacid (ANTACID CALCIUM PO) Take 1 tablet by mouth.    . cetirizine (ZYRTEC) 10 MG tablet Take 10 mg by mouth daily.    . Cholecalciferol (VITAMIN D3) 400 UNITS CAPS Take 1 capsule by mouth daily.    . clotrimazole-betamethasone (LOTRISONE) cream Apply 1 application topically 2 (two) times daily. 30 g 0  . diphenhydrAMINE (BENADRYL) 25 mg capsule Take by mouth.    . fluticasone (FLONASE) 50 MCG/ACT nasal spray Place into the nose.    . ibuprofen (ADVIL,MOTRIN)  200 MG tablet Take by mouth.    . Omega-3 Fatty Acids (FISH OIL PO) Take by mouth.    Marland Kitchen omeprazole (PRILOSEC) 40 MG capsule Take 1 capsule (40 mg total) by mouth daily. 30 capsule 1  . vitamin C (ASCORBIC ACID) 500 MG tablet Take 500 mg by mouth daily.     No current facility-administered medications on file prior to visit.     No orders of the defined types were placed in this encounter.    Physical examination BP (!) 150/81   Pulse 69   Ht 5\' 1"  (1.549 m)   Wt 247 lb 6 oz (112.2 kg)   BMI 46.74 kg/m   General NAD, Conversant  HEENT Atraumatic; Op clear with mmm.  Normo-cephalic. Pupils reactive. Anicteric sclerae    Thyroid/Neck Smooth without nodularity or enlargement. Normal ROM.  Neck Supple.  Skin No rashes, lesions or ulceration. Normal palpated skin turgor. No nodularity.  Breasts: No masses or discharge.  Symmetric.  No axillary adenopathy.  Lungs: Clear to auscultation.No rales or wheezes. Normal Respiratory effort, no retractions.  Heart: NSR.  No murmurs or rubs appreciated. No periferal edema  Abdomen: Soft.  Non-tender.  No masses.  No HSM. No hernia  Extremities: Moves all appropriately.  Normal ROM for age. No lymphadenopathy.  Neuro: Oriented to PPT.  Normal mood. Normal affect.     Pelvic:   Vulva: Normal appearance.  No lesions.  Vagina: No lesions or abnormalities noted.  Support: Cystocele   Urethra No masses tenderness or scarring.  Meatus Normal size without lesions or prolapse.  Cervix: Normal ectropion.  No lesions.  Anus: Normal exam.  No lesions.  Perineum: Normal exam.  No lesions.        Bimanual   Uterus: Normal size.  Non-tender.  Mobile.  AV.  Adnexae: No masses.  Non-tender to palpation.  Cul-de-sac: Negative for abnormality.   Exam limited by patient body habitus  Assessment:   G3P3 Patient Active Problem List   Diagnosis Date Noted  . Type 2 diabetes mellitus without complication ([place]) 84/13/2440  . Candidal intertrigo 05/16/2014  . Arthritis 04/12/2014  . Gastroesophageal reflux disease without esophagitis 04/12/2014  . Seasonal allergies 04/12/2014  . Essential hypertension 04/12/2014  . HLD (hyperlipidemia) 04/12/2014  . Psoriasis 04/12/2014  . Severe obesity (BMI >= 40) ([place]) 04/12/2014    1. Preop examination   2. Endometrial hyperplasia with atypia     Pathology unsure of endometrial atypical degree-concern for possibility of endometrial cancer.   Plan:   1.  Fractional  D&C

## 2016-09-16 NOTE — Transfer of Care (Signed)
Immediate Anesthesia Transfer of Care Note  Patient: Melissa Cabrera  Procedure(s) Performed: Procedure(s): DILATATION AND CURETTAGE (N/A)  Patient Location: PACU  Anesthesia Type:General  Level of Consciousness: sedated  Airway & Oxygen Therapy: Patient Spontanous Breathing and Patient connected to face mask oxygen  Post-op Assessment: Report given to RN and Post -op Vital signs reviewed and stable  Post vital signs: Reviewed and stable  Last Vitals:  Vitals:   09/16/16 1406 09/16/16 1523  BP: (!) 142/72 (!) 148/78  Pulse: 77 69  Resp: 20 14  Temp: 36.3 C (!) 41.6 C    Complications: No apparent anesthesia complications

## 2016-09-16 NOTE — Anesthesia Postprocedure Evaluation (Signed)
Anesthesia Post Note  Patient: Melissa Cabrera  Procedure(s) Performed: Procedure(s) (LRB): DILATATION AND CURETTAGE (N/A)  Patient location during evaluation: PACU Anesthesia Type: General Level of consciousness: awake and alert Pain management: pain level controlled Vital Signs Assessment: post-procedure vital signs reviewed and stable Respiratory status: spontaneous breathing, nonlabored ventilation, respiratory function stable and patient connected to nasal cannula oxygen Cardiovascular status: blood pressure returned to baseline and stable Postop Assessment: no signs of nausea or vomiting Anesthetic complications: no     Last Vitals:  Vitals:   09/16/16 1637 09/16/16 1640  BP: (!) 143/74 (!) 146/67  Pulse: 67   Resp: 16 16  Temp:      Last Pain:  Vitals:   09/16/16 1637  TempSrc:   PainSc: 0-No pain                 Martha Clan

## 2016-09-16 NOTE — Interval H&P Note (Signed)
History and Physical Interval Note:  09/16/2016 2:15 PM  Melissa Cabrera  has presented today for surgery, with the diagnosis of POSTMENOPAUSAL BLEEDING  The various methods of treatment have been discussed with the patient and family. After consideration of risks, benefits and other options for treatment, the patient has consented to [place] as a surgical intervention .  The patient's history has been reviewed, patient examined, no change in status, stable for surgery.  I have reviewed the patient's chart and labs.  Questions were answered to the patient's satisfaction.     Jeannie Fend, MD

## 2016-09-16 NOTE — Op Note (Signed)
   OPERATIVE NOTE 09/16/2016 3:11 PM  PRE-OPERATIVE DIAGNOSIS:  1) POSTMENOPAUSAL BLEEDING  POST-OPERATIVE DIAGNOSIS:  2) Same  OPERATION:  D&C  SURGEON(S): Surgeon(s) and Role:    Harlin Heys, MD - Primary   ANESTHESIA: General  ESTIMATED BLOOD LOSS: 76ml  OPERATIVE FINDINGS: Uterus sound to 8cm.  Significant uterine and bladder prolapse.  3-4th degree. Minimal tissue obtained from endocervical and endometrial sampling.    SPECIMEN:  ID Type Source Tests Collected by Time Destination  1 : endocervical currettings Tissue [facility] Gyn biopsy SURGICAL PATHOLOGY Harlin Heys, MD 09/16/2016 1448   2 : endometrial currettings Tissue [facility] Gyn biopsy SURGICAL PATHOLOGY Harlin Heys, MD 08/19/7024 3785     COMPLICATIONS: None  DRAINS: Foley to gravity  DISPOSITION: Stable to recovery room  DESCRIPTION OF PROCEDURE:      The patient was prepped and draped in the dorsal lithotomy position and placed under general anesthesia. Her cervix was grasped with a Jacob's tenaculum. An endocervical curettage was performed and the specimen sent separately.   Respecting the position and curvature of her cervix, it was dilated to accommodate a small endometrial curette. The curette was placed within the endometrial cavity and a systematic curettage was performed in all quadrants until no additional tissue was noted and a gritty texture was encountered. The tenaculum was removed from the cervix and hemostasis was noted. The weighted speculum was removed and the patient went to recovery room in stable condition.  F/U one week.  Finis Bud, M.D. 09/16/2016 3:11 PM

## 2016-09-16 NOTE — Discharge Instructions (Signed)

## 2016-09-17 ENCOUNTER — Encounter: Payer: Self-pay | Admitting: Obstetrics and Gynecology

## 2016-09-17 ENCOUNTER — Encounter: Payer: BLUE CROSS/BLUE SHIELD | Admitting: Internal Medicine

## 2016-09-19 LAB — SURGICAL PATHOLOGY

## 2016-09-24 DIAGNOSIS — E119 Type 2 diabetes mellitus without complications: Secondary | ICD-10-CM | POA: Insufficient documentation

## 2016-09-24 DIAGNOSIS — E1159 Type 2 diabetes mellitus with other circulatory complications: Secondary | ICD-10-CM | POA: Insufficient documentation

## 2016-09-24 DIAGNOSIS — M2550 Pain in unspecified joint: Secondary | ICD-10-CM | POA: Insufficient documentation

## 2016-09-24 DIAGNOSIS — I10 Essential (primary) hypertension: Secondary | ICD-10-CM | POA: Insufficient documentation

## 2016-09-25 ENCOUNTER — Encounter: Payer: Self-pay | Admitting: Obstetrics and Gynecology

## 2016-09-25 ENCOUNTER — Encounter: Payer: BLUE CROSS/BLUE SHIELD | Admitting: Obstetrics and Gynecology

## 2016-09-25 ENCOUNTER — Ambulatory Visit (INDEPENDENT_AMBULATORY_CARE_PROVIDER_SITE_OTHER): Payer: BLUE CROSS/BLUE SHIELD | Admitting: Obstetrics and Gynecology

## 2016-09-25 VITALS — BP 154/85 | HR 74 | Ht 61.0 in | Wt 247.2 lb

## 2016-09-25 DIAGNOSIS — N8502 Endometrial intraepithelial neoplasia [EIN]: Secondary | ICD-10-CM | POA: Diagnosis not present

## 2016-09-25 DIAGNOSIS — N950 Postmenopausal bleeding: Secondary | ICD-10-CM

## 2016-09-25 NOTE — Progress Notes (Signed)
HPI:      [name] is a 61 y.o. G3P3 who LMP was No LMP recorded. Patient is postmenopausal.  Subjective:   She presents today Postop from [facility] for postmenopausal bleeding. She has had no additional bleeding since her surgery. She complains of worsening urinary symptoms associated with her pelvic prolapse.    Hx: The following portions of the patient's history were reviewed and updated as appropriate:             She  has a past medical history of Allergy; Arthritis; Chicken pox; GERD (gastroesophageal reflux disease); Heart murmur; Hyperlipidemia; Hypertension; Pre-diabetes; and Rheumatic fever. She  does not have any pertinent problems on file. She  has a past surgical history that includes Mouth surgery and Dilation and curettage of uterus (N/A, 09/16/2016). Her family history includes Cancer in her mother and sister; Dementia in her mother; Skin cancer in her brother; Stroke in her maternal grandmother. She  reports that she quit smoking about 21 years ago. Her smoking use included Cigarettes. She has a 64.00 pack-year smoking history. She has never used smokeless tobacco. She reports that she does not drink alcohol or use drugs. She has No Known Allergies.       Review of Systems:  Review of Systems  Constitutional: Denied constitutional symptoms, night sweats, recent illness, fatigue, fever, insomnia and weight loss.  Eyes: Denied eye symptoms, eye pain, photophobia, vision change and visual disturbance.  Ears/Nose/Throat/Neck: Denied ear, nose, throat or neck symptoms, hearing loss, nasal discharge, sinus congestion and sore throat.  Cardiovascular: Denied cardiovascular symptoms, arrhythmia, chest pain/pressure, edema, exercise intolerance, orthopnea and palpitations.  Respiratory: Denied pulmonary symptoms, asthma, pleuritic pain, productive sputum, cough, dyspnea and wheezing.  Gastrointestinal: Denied, gastro-esophageal reflux, melena, nausea and vomiting.  Genitourinary:  See HPI for additional information.  Musculoskeletal: Denied musculoskeletal symptoms, stiffness, swelling, muscle weakness and myalgia.  Dermatologic: Denied dermatology symptoms, rash and scar.  Neurologic: Denied neurology symptoms, dizziness, headache, neck pain and syncope.  Psychiatric: Denied psychiatric symptoms, anxiety and depression.  Endocrine: Denied endocrine symptoms including hot flashes and night sweats.   Meds:   Current Outpatient Prescriptions on File Prior to Visit  Medication Sig Dispense Refill  . amLODipine (NORVASC) 10 MG tablet Take 1 tablet (10 mg total) by mouth daily. (Patient taking differently: Take 10 mg by mouth every evening. ) 30 tablet 1  . Ca Carbonate-Mag Hydroxide (ROLAIDS PO) Take 1 tablet by mouth daily.    . cetirizine (ZYRTEC) 10 MG tablet Take 10 mg by mouth at bedtime.     . Cholecalciferol (VITAMIN D3) 400 UNITS CAPS Take 400 Units by mouth daily.     . clotrimazole-betamethasone (LOTRISONE) cream Apply 1 application topically 2 (two) times daily. 30 g 0  . diphenhydrAMINE (BENADRYL) 25 mg capsule Take 50 mg by mouth at bedtime.     . fluticasone (FLONASE) 50 MCG/ACT nasal spray Place 1 spray into the nose at bedtime.     Marland Kitchen ibuprofen (ADVIL,MOTRIN) 200 MG tablet Take 800 mg by mouth at bedtime.     . Omega-3 Fatty Acids (FISH OIL PO) Take 1 capsule by mouth 2 (two) times daily. 1040 mg of fish oil in each capsule    . omeprazole (PRILOSEC) 40 MG capsule Take 1 capsule (40 mg total) by mouth daily. (Patient taking differently: Take 40 mg by mouth at bedtime. ) 30 capsule 1  . salicylic acid 6 % gel Apply 1 application topically 2 (two) times daily.    Marland Kitchen  vitamin C (ASCORBIC ACID) 500 MG tablet Take 500 mg by mouth daily.     No current facility-administered medications on file prior to visit.     Objective:     Vitals:   09/25/16 1138  BP: (!) 154/85  Pulse: 74              Pathology reveals atypical endometrial  hyperplasia.  Assessment:    G3P3 Patient Active Problem List   Diagnosis Date Noted  . Type 2 diabetes mellitus without complication ([place]) 75/44/9201  . Candidal intertrigo 05/16/2014  . Arthritis 04/12/2014  . Gastroesophageal reflux disease without esophagitis 04/12/2014  . Seasonal allergies 04/12/2014  . Essential hypertension 04/12/2014  . HLD (hyperlipidemia) 04/12/2014  . Psoriasis 04/12/2014  . Severe obesity (BMI >= 40) ([place]) 04/12/2014     1. Endometrial hyperplasia with atypia   2. Postmenopausal bleeding        Plan:            1.  We have discussed multiple options regarding endometrial hyperplasia. These include direct referral to GYN oncology, possible co-procedure with GYN oncology, the rationale for possible lymph node sampling as needed and the need for hysterectomy specifically. All the patient's questions were answered.  2.  We'll refer to GYN oncology for consultation and then consider the possibility of joints laparoscopic/vaginal procedure depending upon findings at consultation and the patient's desire.  Orders Orders Placed This Encounter  Procedures  . Ambulatory referral to Gynecologic Oncology         F/U  No Follow-up on file. I spent 27 minutes with this patient of which greater than 50% was spent discussing endometrial hyperplasia, hysterectomy, GYN oncology and the rationale for consultation.  Finis Bud, M.D. 09/25/2016 12:27 PM

## 2016-10-02 ENCOUNTER — Inpatient Hospital Stay: Payer: BLUE CROSS/BLUE SHIELD | Attending: Obstetrics and Gynecology | Admitting: Obstetrics and Gynecology

## 2016-10-02 ENCOUNTER — Encounter: Payer: Self-pay | Admitting: Obstetrics and Gynecology

## 2016-10-02 VITALS — BP 138/83 | HR 81 | Temp 98.8°F | Resp 18 | Ht 61.0 in | Wt 245.1 lb

## 2016-10-02 DIAGNOSIS — K219 Gastro-esophageal reflux disease without esophagitis: Secondary | ICD-10-CM | POA: Diagnosis not present

## 2016-10-02 DIAGNOSIS — J302 Other seasonal allergic rhinitis: Secondary | ICD-10-CM | POA: Diagnosis not present

## 2016-10-02 DIAGNOSIS — N8502 Endometrial intraepithelial neoplasia [EIN]: Secondary | ICD-10-CM | POA: Diagnosis not present

## 2016-10-02 DIAGNOSIS — R011 Cardiac murmur, unspecified: Secondary | ICD-10-CM

## 2016-10-02 DIAGNOSIS — Z6841 Body Mass Index (BMI) 40.0 and over, adult: Secondary | ICD-10-CM | POA: Diagnosis not present

## 2016-10-02 DIAGNOSIS — L409 Psoriasis, unspecified: Secondary | ICD-10-CM | POA: Diagnosis not present

## 2016-10-02 DIAGNOSIS — E119 Type 2 diabetes mellitus without complications: Secondary | ICD-10-CM

## 2016-10-02 DIAGNOSIS — Z780 Asymptomatic menopausal state: Secondary | ICD-10-CM | POA: Diagnosis not present

## 2016-10-02 DIAGNOSIS — I10 Essential (primary) hypertension: Secondary | ICD-10-CM | POA: Diagnosis not present

## 2016-10-02 DIAGNOSIS — M1990 Unspecified osteoarthritis, unspecified site: Secondary | ICD-10-CM | POA: Diagnosis not present

## 2016-10-02 DIAGNOSIS — E785 Hyperlipidemia, unspecified: Secondary | ICD-10-CM | POA: Diagnosis not present

## 2016-10-02 DIAGNOSIS — Z79899 Other long term (current) drug therapy: Secondary | ICD-10-CM | POA: Diagnosis not present

## 2016-10-02 DIAGNOSIS — Z87891 Personal history of nicotine dependence: Secondary | ICD-10-CM | POA: Diagnosis not present

## 2016-10-02 NOTE — Progress Notes (Signed)
Gynecologic Oncology Consult Visit   Referring Provider: Dr Jeannie Fend  Chief Concern: EIN  Subjective:  Melissa Cabrera is a 61 y.o. G3P3 female who is seen in consultation from Dr. Amalia Hailey for EIN.  Presented to Dr Marcelline Mates 08/16/16 for concerns regarding vaginal bleeding. She has been menopausal for 5 years. Has never been on HRT  Bleeding is described as flow about like a period.   D&C 08/26/16 showed EIN and ECC was negative.  Only light occasional spotting since then.    She has some pelvic pressure and descent of the cervix to the introitus, but no significant SUI symptoms.  Does wear a minipad for some mild drainage.  Dr Leonides Schanz spoke to her about mesh repair of vagina and urethra with hysterectomy/BSO.  Problem List: Patient Active Problem List   Diagnosis Date Noted  . Type 2 diabetes mellitus without complication ([place]) 23/55/7322  . Candidal intertrigo 05/16/2014  . Arthritis 04/12/2014  . Gastroesophageal reflux disease without esophagitis 04/12/2014  . Seasonal allergies 04/12/2014  . Essential hypertension 04/12/2014  . HLD (hyperlipidemia) 04/12/2014  . Psoriasis 04/12/2014  . Severe obesity (BMI >= 40) ([place]) 04/12/2014    Past Medical History: Past Medical History:  Diagnosis Date  . Allergy   . Arthritis   . Chicken pox   . GERD (gastroesophageal reflux disease)   . Heart murmur    DUE TO RHEUMATIC FEVER PER PT  . Hyperlipidemia   . Hypertension   . Pre-diabetes   . Psoriasis   . Rheumatic fever   . Rosacea     Past Surgical History: Past Surgical History:  Procedure Laterality Date  . DILATION AND CURETTAGE OF UTERUS N/A 09/16/2016   Procedure: DILATATION AND CURETTAGE;  Surgeon: Harlin Heys, MD;  Location: [facility] ORS;  Service: Gynecology;  Laterality: N/A;  . MOUTH SURGERY        OB History:  OB History  Gravida Para Term Preterm AB Living  3 3       3   SAB TAB Ectopic Multiple Live Births          3    # Outcome Date GA Lbr Len/2nd Weight Sex  Delivery Anes PTL Lv  3 Para      Vag-Spont   LIV  2 Para      Vag-Spont   LIV  1 Para      Vag-Spont   LIV      Family History: Family History  Problem Relation Age of Onset  . Cancer Mother        skin  . Dementia Mother   . Cancer Sister        skin and liver  . Stroke Maternal Grandmother   . Skin cancer Brother   . Diabetes Neg Hx   . Heart disease Neg Hx     Social History: Social History   Social History  . Marital status: Married    Spouse name: N/A  . Number of children: N/A  . Years of education: N/A   Occupational History  . Not on file.   Social History Main Topics  . Smoking status: Former Smoker    Packs/day: 4.00    Years: 16.00    Types: Cigarettes    Quit date: 09/12/1995  . Smokeless tobacco: Never Used     Comment: quit in 1997  . Alcohol use No  . Drug use: No  . Sexual activity: Yes    Birth control/ protection: None   Other  Topics Concern  . Not on file   Social History Narrative  . No narrative on file    Allergies: No Known Allergies  Current Medications: Current Outpatient Prescriptions  Medication Sig Dispense Refill  . amLODipine (NORVASC) 10 MG tablet Take 1 tablet (10 mg total) by mouth daily. (Patient taking differently: Take 10 mg by mouth every evening. ) 30 tablet 1  . betamethasone dipropionate (DIPROLENE) 0.05 % ointment Apply 1 application topically 2 (two) times daily. psoriasis    . Ca Carbonate-Mag Hydroxide (ROLAIDS PO) Take 1 tablet by mouth daily.    . cetirizine (ZYRTEC) 10 MG tablet Take 10 mg by mouth at bedtime.     . Cholecalciferol (VITAMIN D3) 400 UNITS CAPS Take 400 Units by mouth daily.     . clotrimazole-betamethasone (LOTRISONE) cream Apply 1 application topically 2 (two) times daily. 30 g 0  . fluocinonide ointment (LIDEX) 6.65 % Apply 1 application topically 2 (two) times daily.    . fluticasone (FLONASE) 50 MCG/ACT nasal spray Place 1 spray into the nose at bedtime.     . folic acid (FOLVITE) 1 MG  tablet Take 1 mg by mouth daily.    . methotrexate (RHEUMATREX) 2.5 MG tablet Take 15 mg by mouth once a week. Caution:Chemotherapy. Protect from light.    . Omega-3 Fatty Acids (FISH OIL PO) Take 1 capsule by mouth 2 (two) times daily. 1040 mg of fish oil in each capsule    . omeprazole (PRILOSEC) 40 MG capsule Take 1 capsule (40 mg total) by mouth daily. (Patient taking differently: Take 40 mg by mouth at bedtime. ) 30 capsule 1  . salicylic acid 6 % gel Apply 1 application topically 2 (two) times daily.    . vitamin C (ASCORBIC ACID) 500 MG tablet Take 500 mg by mouth daily.    . diphenhydrAMINE (BENADRYL) 25 mg capsule Take 50 mg by mouth at bedtime.     Marland Kitchen ibuprofen (ADVIL,MOTRIN) 200 MG tablet Take 800 mg by mouth at bedtime.      No current facility-administered medications for this visit.     Review of Systems General: negative for, fevers, fatigue, changes in sleep, changes in weight or appetite Skin: negative for changes in color, texture, moles or lesions Eyes: negative for, changes in vision, pain, diplopia HEENT: negative for, change in hearing, pain, discharge, vertigo, voice changes, sore throat, neck masses Breasts: negative for breast lumps Pulmonary: negative for, dyspnea, orthopnea, productive cough Cardiac: negative for, palpitations, syncope, pain, discomfort, pressure Gastrointestinal: negative for, dysphagia, nausea, vomiting, jaundice, pain, constipation, diarrhea, hematemesis, hematochezia Genitourinary/Sexual: negative for, dysuria, discharge, hesitancy, nocturia, retention, stones, infections, STD's, incontinence Ob/Gyn: negative for, pain Musculoskeletal: negative for, pain, stiffness, swelling, range of motion limitation Hematology: negative for, easy bruising, bleeding Neurologic/Psych: negative for, headaches, seizures, paralysis, weakness, tremor, change in gait, change in sensation, mood swings, depression, anxiety, change in memory  Objective:  Physical  Examination:  BP 138/83   Pulse 81   Temp 98.8 F (37.1 C) (Tympanic)   Resp 18   Ht 5\' 1"  (1.549 m)   Wt 245 lb 1.6 oz (111.2 kg)   BMI 46.31 kg/m    ECOG Performance Status: 1 - Symptomatic but completely ambulatory  General appearance: alert, cooperative and appears stated age. Morbid obesity. HEENT:PERRLA, neck supple with midline trachea and thyroid without masses Lymph node survey: non-palpable, axillary, inguinal, supraclavicular Cardiovascular: regular rate and rhythm, no murmurs or gallops Respiratory: normal air entry, lungs clear to auscultation and  no rales, rhonchi or wheezing Breast exam: not examined. Abdomen: soft, non-tender, without masses or organomegaly, no hernias and well healed incision Back: inspection of back is normal Extremities: extremities normal, atraumatic, no cyanosis or edema Skin exam - normal coloration and turgor, no rashes, no suspicious skin lesions noted. Neurological exam reveals alert, oriented, normal speech, no focal findings or movement disorder noted.  Pelvic: exam chaperoned by nurse;  Vulva: normal appearing vulva with no masses, tenderness or lesions; Vagina: normal vagina; Adnexa: normal adnexa in size, nontender and no masses; Uterus: uterus is normal size, shape, consistency and nontender; Cervix: anteverted, descends to introitus with valsalva, but no urine leak; Rectal: normal rectal, no masses.    Assessment:  Melissa Cabrera is a 61 y.o. 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  Uterine decsensus to introitus without significant stress incontinence.     Medical co-morbidities complicating care: morbid obesity.  Plan:   Problem List Items Addressed This Visit    None    Visit Diagnoses    Complex atypical endometrial hyperplasia    -  Primary     We discussed options for management including TLH/BSO versus hormonal management for EIN. She would prefer surgery and this is probably the  best approach, as she is a reasonable surgical candidate and also has uterine prolapse.  I spoke to Dr. Amalia Hailey and he plans mesh suspension of the urethra to prevent urinary incontinence after hysterectomy.  She will see him back in the next few weeks and his office he can post her for surgery on a day when we are available for TLH/BSO, sentinel lymph node mapping and possible pelvic SLN biopsies and/or pelvic/aortic lymph node dissection if cancer found on frozen section.     The patient's diagnosis, an outline of the further diagnostic and laboratory studies which will be required, the recommendation, and alternatives were discussed.  All questions were answered to the patient's satisfaction.  A total of 45 minutes were spent with the patient/family today; 30% was spent in education, counseling and coordination of care for EIN.    Mellody Drown, MD  CC:  [name], NP [address] [place], [place] [address] [phone]

## 2016-10-02 NOTE — Progress Notes (Signed)
Pt here and anxious about the results of her pap smear and she knows them but here for GYN oncology to decide if she should have surgery.

## 2016-10-02 NOTE — Progress Notes (Signed)
  Oncology Nurse Navigator Documentation Chaperoned pelvic exam. She will be seen back at Dr. Amalia Hailey office for consenting and OR posting. Navigator Location: [facility] (10/02/16 1500)   )Navigator Encounter Type: Initial GynOnc (10/02/16 1500)                     Patient Visit Type: GynOnc (10/02/16 1500)                              Time Spent with Patient: 30 (10/02/16 1500)

## 2016-10-03 ENCOUNTER — Other Ambulatory Visit: Payer: Self-pay | Admitting: Internal Medicine

## 2016-10-15 ENCOUNTER — Encounter: Payer: BLUE CROSS/BLUE SHIELD | Admitting: Obstetrics and Gynecology

## 2016-10-15 ENCOUNTER — Ambulatory Visit (INDEPENDENT_AMBULATORY_CARE_PROVIDER_SITE_OTHER): Payer: BLUE CROSS/BLUE SHIELD | Admitting: Obstetrics and Gynecology

## 2016-10-15 ENCOUNTER — Encounter: Payer: Self-pay | Admitting: Obstetrics and Gynecology

## 2016-10-15 VITALS — BP 145/77 | HR 68 | Ht 61.0 in | Wt 246.2 lb

## 2016-10-15 DIAGNOSIS — N8502 Endometrial intraepithelial neoplasia [EIN]: Secondary | ICD-10-CM

## 2016-10-15 DIAGNOSIS — Z01818 Encounter for other preprocedural examination: Secondary | ICD-10-CM | POA: Diagnosis not present

## 2016-10-15 DIAGNOSIS — R32 Unspecified urinary incontinence: Secondary | ICD-10-CM | POA: Diagnosis not present

## 2016-10-15 DIAGNOSIS — N819 Female genital prolapse, unspecified: Secondary | ICD-10-CM | POA: Diagnosis not present

## 2016-10-15 MED ORDER — METRONIDAZOLE 500 MG PO TABS: 500.0000 mg | ORAL_TABLET | Freq: Two times a day (BID) | ORAL | 0 refills | Status: DC

## 2016-10-15 NOTE — Progress Notes (Signed)
HPI:      [name] is a 61 y.o. G3P3 who LMP was No LMP recorded. Patient is postmenopausal.  Subjective:   She presents today for her preop H&P. She has previously met with Dr. Suzy Bouchard and discussed hysterectomy and possible lymph node sampling. She is requesting vaginal repair at the time of her surgery. She complains of daily urine loss which she says is getting worse. She reports she uses a pad every day and must change it multiple times because it is wet with urine. She remains sexually active and would like to continue to have sexual function.    Hx: The following portions of the patient's history were reviewed and updated as appropriate:             She  has a past medical history of Allergy; Arthritis; Chicken pox; GERD (gastroesophageal reflux disease); Heart murmur; Hyperlipidemia; Hypertension; Pre-diabetes; Psoriasis; Rheumatic fever; and Rosacea. She  does not have any pertinent problems on file. She  has a past surgical history that includes Mouth surgery and Dilation and curettage of uterus (N/A, 09/16/2016). Her family history includes Cancer in her mother and sister; Dementia in her mother; Skin cancer in her brother; Stroke in her maternal grandmother. She  reports that she quit smoking about 21 years ago. Her smoking use included Cigarettes. She has a 64.00 pack-year smoking history. She has never used smokeless tobacco. She reports that she does not drink alcohol or use drugs. She has No Known Allergies.       Review of Systems:  Review of Systems  Constitutional: Denied constitutional symptoms, night sweats, recent illness, fatigue, fever, insomnia and weight loss.  Eyes: Denied eye symptoms, eye pain, photophobia, vision change and visual disturbance.  Ears/Nose/Throat/Neck: Denied ear, nose, throat or neck symptoms, hearing loss, nasal discharge, sinus congestion and sore throat.  Cardiovascular: Denied cardiovascular symptoms, arrhythmia, chest pain/pressure,  edema, exercise intolerance, orthopnea and palpitations.  Respiratory: Denied pulmonary symptoms, asthma, pleuritic pain, productive sputum, cough, dyspnea and wheezing.  Gastrointestinal: Denied, gastro-esophageal reflux, melena, nausea and vomiting.  Genitourinary: See HPI for additional information.  Musculoskeletal: Denied musculoskeletal symptoms, stiffness, swelling, muscle weakness and myalgia.  Dermatologic: Denied dermatology symptoms, rash and scar.  Neurologic: Denied neurology symptoms, dizziness, headache, neck pain and syncope.  Psychiatric: Denied psychiatric symptoms, anxiety and depression.  Endocrine: Denied endocrine symptoms including hot flashes and night sweats.   Meds:   Current Outpatient Prescriptions on File Prior to Visit  Medication Sig Dispense Refill  . amLODipine (NORVASC) 10 MG tablet TAKE 1 TABLET BY MOUTH ONCE DAILY 30 tablet 1  . betamethasone dipropionate (DIPROLENE) 0.05 % ointment Apply 1 application topically 2 (two) times daily. psoriasis    . Ca Carbonate-Mag Hydroxide (ROLAIDS PO) Take 1 tablet by mouth daily.    . cetirizine (ZYRTEC) 10 MG tablet Take 10 mg by mouth at bedtime.     . Cholecalciferol (VITAMIN D3) 400 UNITS CAPS Take 400 Units by mouth daily.     . clotrimazole-betamethasone (LOTRISONE) cream Apply 1 application topically 2 (two) times daily. 30 g 0  . diphenhydrAMINE (BENADRYL) 25 mg capsule Take 50 mg by mouth at bedtime.     . fluocinonide ointment (LIDEX) 2.02 % Apply 1 application topically 2 (two) times daily.    . fluticasone (FLONASE) 50 MCG/ACT nasal spray Place 1 spray into the nose at bedtime.     . folic acid (FOLVITE) 1 MG tablet Take 1 mg by mouth daily.    Marland Kitchen  methotrexate (RHEUMATREX) 2.5 MG tablet Take 15 mg by mouth once a week. Caution:Chemotherapy. Protect from light.    . Omega-3 Fatty Acids (FISH OIL PO) Take 1 capsule by mouth 2 (two) times daily. 1040 mg of fish oil in each capsule    . omeprazole (PRILOSEC) 40  MG capsule Take 1 capsule (40 mg total) by mouth daily. (Patient taking differently: Take 40 mg by mouth at bedtime. ) 30 capsule 1  . salicylic acid 6 % gel Apply 1 application topically 2 (two) times daily.    . vitamin C (ASCORBIC ACID) 500 MG tablet Take 500 mg by mouth daily.    Marland Kitchen ibuprofen (ADVIL,MOTRIN) 200 MG tablet Take 800 mg by mouth at bedtime.      No current facility-administered medications on file prior to visit.     Objective:     Vitals:   10/15/16 0948  BP: (!) 145/77  Pulse: 68              See preop H&P  Assessment:    G3P3 Patient Active Problem List   Diagnosis Date Noted  . Type 2 diabetes mellitus without complication ([place]) 77/82/4235  . Candidal intertrigo 05/16/2014  . Arthritis 04/12/2014  . Gastroesophageal reflux disease without esophagitis 04/12/2014  . Seasonal allergies 04/12/2014  . Essential hypertension 04/12/2014  . HLD (hyperlipidemia) 04/12/2014  . Psoriasis 04/12/2014  . Severe obesity (BMI >= 40) ([place]) 04/12/2014     1. Endometrial hyperplasia with atypia   2. Preop examination   3. 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 Female genital prolapse, unspecified type     Patient is specifically requesting surgery for prolapse but mostly for daily urine loss.   Plan:            1.  T LH/LAVH/BSO possible lymph node sampling.  (surgery in conjunction with Dr. Suzy Bouchard  2.  A&P repair with TOT  LAVH The procedure of Laparoscopic Assisted Vaginal Hysterectomy was described to the patient in detail.  We reviewed the rationale for Hysterectomy and the patient was again informed of other nonsurgical management possibilities for her condition.  She has considered these other options, and desires a Hysterectomy.  We have reviewed the fact that Hysterectomy is permanent and that following the procedure she will not be able to become pregnant or bear children.  We have discussed the following risk factors  specifically and the patient has also been informed that additional complications not mentioned may develop:  Damage to bowel, bladder, ureters or to other internal organs, bleeding, infection and the risk from anesthesia.  We have discussed the procedure itself in detail and she has an informed understanding of this surgery.  We have also discussed the recovery period in which physical and sexual activity will be restricted for a varying degree of time, often 3 - 6 weeks. The Laparoscopic Portion of Hysterectomy has also been reviewed with the patient.  She understands how the laparoscope facilitates the procedure.  We have discussed the abdominal incisions and punctures that will be used.  We have also reviewed the increased Operating Room time often accompanying LAVH.  The slightly increased risk of complications secondary to abdominal punctures, and use of laparoscopic instrumentation has also been discussed in detail.I have answered all of her questions and I believe the patient has an informed understanding of the procedure of Laparoscopic Assisted Vaginal Hysterectomy. Oophorectomy The option of Oophorectomy has been discussed with the patient.  Detailed risk/benefits have been reviewed.  The risks discussed include, but are not limited to, hemorrhage, infection, damage to ureter or other internal organ, and Ovarian Remnant Syndrome.  The benefits include a significant decrease in the risk of Ovarian Cancer and in benign Ovarian disease.  The risk of Ovarian CA has been estimated at 1 in 62.  This is a relatively small risk.  However, should Ovarian CA develop, it is often found late in the course of the disease.  We have also discussed the role of inheritance in the development of Ovarian disease.  Some women, who have close relatives with Ovarian CA, have a higher than 1 in 70 risk of Ovarian CA.  The benefits of Estrogen replacement therapy following Oophorectomy has been stressed.  If she is  premenopausal, we have discussed the fact that this procedure will make her permanently sterile and that premature menopause will result if no ERT is begun.  I have answered all of her questions, and I believe that she has an adequate and informed understanding of the risks and benefits of Oophorectomy. She plans on oophorectomy bilateral  Anterior Repair I have discussed the procedure of anterior repair and Kelly placation.  I have informed the patient that this procedure often corrects or improves stress urinary incontinence, but that there is certainly no guarantee of her improvement.  The procedure itself was discussed.  The possible damage to the bowel, ureters or urethra was also discussed.  We have reviewed the repositioning of the bladder that often takes place at anterior repair and I have informed her that although unlikely, it is possible that a worsening of her incontinence could occur after this procedure.  I have also discussed with her the necessity of decreased lifting and physical activity following the procedure as well as the possibility that as she gets older, her stress urinary incontinence could slowly return.  The use of vaginal Estrogen or oral Estrogen as well as other medications in the role of both stress and bladder dysynergia incontinence were discussed.  I have discussed the complication of inability to void immediately following the procedure.  The patient is aware that she may go home using a Foley catheter or may be taught the technique of self-catheterization should a complication develop.  All of her questions have been answered and I believe that she has an informed understanding of anterior repair/Kelly plication. Posterior Repair Posterior repair was discussed with the patient.  The risks were reviewed and include:  possible damage to rectum and bowel, bleeding, infection and anesthesia.  The benefits were also discussed.  Post-op recovery with special attention to  hospital stay and return to sexual function were specifically reviewed.  All her questions were answered, and I believe that she has an informed understanding of Posterior repair.  TOT I have discussed the procedure of tension free vaginal tape using the trans-obturator approach. (TOT).  I have informed the patient that this procedure often corrects or improves stress urinary incontinence.  For patients without urinary incontinence-this procedure is often performed to lower the risk of iatragenic urinary incontinence at the time of cystocele repair.The patient has been made aware that there is no guarantee of her improvement or the length of time her improvement will last.  The procedure itself was discussed in detail including possible damage to bowel, ureters, urethra and bladder.  I have informed her that the mesh is permanent.  The risk of extrusion of the mesh has also been reviewed.  The  management of this complication has been discussed.  The risks of bleeding and infection were also reviewed.  I have specifically discussed the complication of inability to void following the procedure and the patient is aware that she may go home using a Foley catheter or using a self-catheterization technique.  In addition, I have discussed with the patient the use of cystoscopy to diagnose bladder injury should there be any question of this.    Regarding the polypropylene mesh and mid-urethral slings: I have review the current position statements from AUGS 2014 and the AUA.  I have given her copies of both to review. All of her questions were answered.  This is the second time that we have spoken regarding use of mesh. The risks and benefits were reviewed in great detail.  She has been advised that should she have any additional questions or concerns regarding her sling procedure we would be happy to schedule a time before her surgery to discuss them. All of the patient's questions have been answered and I believe she  has an informed understanding of TOT and cystoscopy.  She is adamant that she would like a mesh urethral sling placed.       Meds ordered this encounter  Medications  . metroNIDAZOLE (FLAGYL) 500 MG tablet    Sig: Take 1 tablet (500 mg total) by mouth 2 (two) times daily. Begin 5 days prior to scheduled surgery as directed.    Dispense:  10 tablet    Refill:  0        F/U  Return for As Scheduled Post-op.  Finis Bud, M.D. 10/15/2016 11:51 AM

## 2016-10-15 NOTE — H&P (Signed)
PRE-OPERATIVE HISTORY AND PHYSICAL EXAM  PCP:  [name], NP Subjective:   HPI:  Melissa Cabrera is a 61 y.o. G3P3.  No LMP recorded. Patient is postmenopausal.  She presents today for a pre-op discussion and PE.  She has the following symptoms:  She has experienced postmenopausal bleeding-underwent D&C revealing atypical endometrial hyperplasia. In addition she has daily urine loss which she says is getting worse.  She would like this fixed at the same time as her surgery for EIN.  Review of Systems:   Constitutional: Denied constitutional symptoms, night sweats, recent illness, fatigue, fever, insomnia and weight loss.  Eyes: Denied eye symptoms, eye pain, photophobia, vision change and visual disturbance.  Ears/Nose/Throat/Neck: Denied ear, nose, throat or neck symptoms, hearing loss, nasal discharge, sinus congestion and sore throat.  Cardiovascular: Denied cardiovascular symptoms, arrhythmia, chest pain/pressure, edema, exercise intolerance, orthopnea and palpitations.  Respiratory: Denied pulmonary symptoms, asthma, pleuritic pain, productive sputum, cough, dyspnea and wheezing.  Gastrointestinal: Denied, gastro-esophageal reflux, melena, nausea and vomiting.  Genitourinary: See HPI for additional information.  Musculoskeletal: Denied musculoskeletal symptoms, stiffness, swelling, muscle weakness and myalgia.  Dermatologic: Denied dermatology symptoms, rash and scar.  Neurologic: Denied neurology symptoms, dizziness, headache, neck pain and syncope.  Psychiatric: Denied psychiatric symptoms, anxiety and depression.  Endocrine: Denied endocrine symptoms including hot flashes and night sweats.   OB History  Gravida Para Term Preterm AB Living  3 3       3   SAB TAB Ectopic Multiple Live Births          3    # Outcome Date GA Lbr Len/2nd Weight Sex Delivery Anes PTL Lv  3 Para      Vag-Spont   LIV  2 Para      Vag-Spont   LIV  1 Para      Vag-Spont   LIV      Past  Medical History:  Diagnosis Date  . Allergy   . Arthritis   . Chicken pox   . GERD (gastroesophageal reflux disease)   . Heart murmur    DUE TO RHEUMATIC FEVER PER PT  . Hyperlipidemia   . Hypertension   . Pre-diabetes   . Psoriasis   . Rheumatic fever   . Rosacea     Past Surgical History:  Procedure Laterality Date  . DILATION AND CURETTAGE OF UTERUS N/A 09/16/2016   Procedure: DILATATION AND CURETTAGE;  Surgeon: Harlin Heys, MD;  Location: [facility] ORS;  Service: Gynecology;  Laterality: N/A;  . MOUTH SURGERY        SOCIAL HISTORY: History  Smoking Status  . Former Smoker  . Packs/day: 4.00  . Years: 16.00  . Types: Cigarettes  . Quit date: 09/12/1995  Smokeless Tobacco  . Never Used    Comment: quit in 1997   History  Alcohol Use No   History  Drug Use No    Family History  Problem Relation Age of Onset  . Cancer Mother        skin  . Dementia Mother   . Cancer Sister        skin and liver  . Stroke Maternal Grandmother   . Skin cancer Brother   . Diabetes Neg Hx   . Heart disease Neg Hx     ALLERGIES:  Patient has no known allergies.  MEDS:   Current Outpatient Prescriptions on File Prior to Visit  Medication Sig Dispense Refill  . amLODipine (NORVASC)  10 MG tablet TAKE 1 TABLET BY MOUTH ONCE DAILY 30 tablet 1  . betamethasone dipropionate (DIPROLENE) 0.05 % ointment Apply 1 application topically 2 (two) times daily. psoriasis    . Ca Carbonate-Mag Hydroxide (ROLAIDS PO) Take 1 tablet by mouth daily.    . cetirizine (ZYRTEC) 10 MG tablet Take 10 mg by mouth at bedtime.     . Cholecalciferol (VITAMIN D3) 400 UNITS CAPS Take 400 Units by mouth daily.     . clotrimazole-betamethasone (LOTRISONE) cream Apply 1 application topically 2 (two) times daily. 30 g 0  . diphenhydrAMINE (BENADRYL) 25 mg capsule Take 50 mg by mouth at bedtime.     . fluocinonide ointment (LIDEX) 5.80 % Apply 1 application topically 2 (two) times daily.    . fluticasone  (FLONASE) 50 MCG/ACT nasal spray Place 1 spray into the nose at bedtime.     . folic acid (FOLVITE) 1 MG tablet Take 1 mg by mouth daily.    . methotrexate (RHEUMATREX) 2.5 MG tablet Take 15 mg by mouth once a week. Caution:Chemotherapy. Protect from light.    . Omega-3 Fatty Acids (FISH OIL PO) Take 1 capsule by mouth 2 (two) times daily. 1040 mg of fish oil in each capsule    . omeprazole (PRILOSEC) 40 MG capsule Take 1 capsule (40 mg total) by mouth daily. (Patient taking differently: Take 40 mg by mouth at bedtime. ) 30 capsule 1  . salicylic acid 6 % gel Apply 1 application topically 2 (two) times daily.    . vitamin C (ASCORBIC ACID) 500 MG tablet Take 500 mg by mouth daily.    Marland Kitchen ibuprofen (ADVIL,MOTRIN) 200 MG tablet Take 800 mg by mouth at bedtime.      No current facility-administered medications on file prior to visit.     Meds ordered this encounter  Medications  . metroNIDAZOLE (FLAGYL) 500 MG tablet    Sig: Take 1 tablet (500 mg total) by mouth 2 (two) times daily. Begin 5 days prior to scheduled surgery as directed.    Dispense:  10 tablet    Refill:  0     Physical examination BP (!) 145/77   Pulse 68   Ht 5\' 1"  (1.549 m)   Wt 246 lb 4 oz (111.7 kg)   BMI 46.53 kg/m   General NAD, Conversant  HEENT Atraumatic; Op clear with mmm.  Normo-cephalic. Pupils reactive. Anicteric sclerae  Thyroid/Neck Smooth without nodularity or enlargement. Normal ROM.  Neck Supple.  Skin No rashes, lesions or ulceration. Normal palpated skin turgor. No nodularity.  Breasts: No masses or discharge.  Symmetric.  No axillary adenopathy.  Lungs: Clear to auscultation.No rales or wheezes. Normal Respiratory effort, no retractions.  Heart: NSR.  No murmurs or rubs appreciated. No periferal edema  Abdomen: Soft.  Non-tender.  No masses.  No HSM. No hernia  Extremities: Moves all appropriately.  Normal ROM for age. No lymphadenopathy.  Neuro: Oriented to PPT.  Normal mood. Normal affect.      Pelvic:   Vulva: Normal appearance.  No lesions.  Vagina: No lesions or abnormalities noted.  Support: Normal pelvic support.  Urethra No masses tenderness or scarring.  Meatus Normal size without lesions or prolapse.  Cervix: Normal ectropion.  No lesions.  Anus: Normal exam.  No lesions.  Perineum: Normal exam.  No lesions.        Bimanual   Uterus: Normal size.  Non-tender.  Mobile.  AV.  Adnexae: No masses.  Non-tender to palpation.  Cul-de-sac: Negative for abnormality.   Assessment:   G3P3 Patient Active Problem List   Diagnosis Date Noted  . Type 2 diabetes mellitus without complication ([place]) 14/43/1540  . Candidal intertrigo 05/16/2014  . Arthritis 04/12/2014  . Gastroesophageal reflux disease without esophagitis 04/12/2014  . Seasonal allergies 04/12/2014  . Essential hypertension 04/12/2014  . HLD (hyperlipidemia) 04/12/2014  . Psoriasis 04/12/2014  . Severe obesity (BMI >= 40) ([place]) 04/12/2014    1. Endometrial hyperplasia with atypia   2. Preop examination   3. 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 Female genital prolapse, unspecified type     Will plan definitive surgery in conjunction with Dr. Suzy Bouchard.   Patient would also like surgery for pelvic prolapse specifically for daily urine loss.   Plan:   1.  TLH or LAVH with BSO possible lymphnode sampling A&P repair with TOT    Finis Bud, M.D. 10/15/2016 12:00 PM

## 2016-10-16 ENCOUNTER — Other Ambulatory Visit: Payer: Self-pay | Admitting: Internal Medicine

## 2016-10-18 ENCOUNTER — Encounter: Payer: Self-pay | Admitting: Obstetrics and Gynecology

## 2016-10-29 ENCOUNTER — Ambulatory Visit (INDEPENDENT_AMBULATORY_CARE_PROVIDER_SITE_OTHER): Payer: BLUE CROSS/BLUE SHIELD | Admitting: Internal Medicine

## 2016-10-29 ENCOUNTER — Encounter: Payer: Self-pay | Admitting: Internal Medicine

## 2016-10-29 VITALS — BP 138/84 | HR 77 | Temp 98.0°F | Ht 61.5 in | Wt 245.5 lb

## 2016-10-29 DIAGNOSIS — E119 Type 2 diabetes mellitus without complications: Secondary | ICD-10-CM | POA: Diagnosis not present

## 2016-10-29 DIAGNOSIS — N950 Postmenopausal bleeding: Secondary | ICD-10-CM

## 2016-10-29 DIAGNOSIS — Z114 Encounter for screening for human immunodeficiency virus [HIV]: Secondary | ICD-10-CM

## 2016-10-29 DIAGNOSIS — E7800 Pure hypercholesterolemia, unspecified: Secondary | ICD-10-CM

## 2016-10-29 DIAGNOSIS — Z1159 Encounter for screening for other viral diseases: Secondary | ICD-10-CM

## 2016-10-29 DIAGNOSIS — K219 Gastro-esophageal reflux disease without esophagitis: Secondary | ICD-10-CM

## 2016-10-29 DIAGNOSIS — J301 Allergic rhinitis due to pollen: Secondary | ICD-10-CM | POA: Diagnosis not present

## 2016-10-29 DIAGNOSIS — I10 Essential (primary) hypertension: Secondary | ICD-10-CM

## 2016-10-29 DIAGNOSIS — M1990 Unspecified osteoarthritis, unspecified site: Secondary | ICD-10-CM

## 2016-10-29 DIAGNOSIS — L409 Psoriasis, unspecified: Secondary | ICD-10-CM | POA: Diagnosis not present

## 2016-10-29 DIAGNOSIS — Z0001 Encounter for general adult medical examination with abnormal findings: Secondary | ICD-10-CM

## 2016-10-29 LAB — CBC
HCT: 41.6 % (ref 36.0–46.0)
Hemoglobin: 13.9 g/dL (ref 12.0–15.0)
MCHC: 33.3 g/dL (ref 30.0–36.0)
MCV: 87 fl (ref 78.0–100.0)
Platelets: 367 10*3/uL (ref 150.0–400.0)
RBC: 4.78 Mil/uL (ref 3.87–5.11)
RDW: 14.3 % (ref 11.5–15.5)
WBC: 7.4 10*3/uL (ref 4.0–10.5)

## 2016-10-29 LAB — LIPID PANEL
CHOLESTEROL: 225 mg/dL — AB (ref 0–200)
HDL: 52.3 mg/dL (ref >39.00)
LDL Cholesterol: 148 mg/dL — ABNORMAL HIGH (ref 0–99)
NonHDL: 172.9
TRIGLYCERIDES: 124 mg/dL (ref 0.0–149.0)
Total CHOL/HDL Ratio: 4
VLDL: 24.8 mg/dL (ref 0.0–40.0)

## 2016-10-29 LAB — MICROALBUMIN / CREATININE URINE RATIO
CREATININE, U: 153.4 mg/dL
MICROALB UR: 3.4 mg/dL — AB (ref 0.0–1.9)
MICROALB/CREAT RATIO: 2.2 mg/g (ref 0.0–30.0)

## 2016-10-29 LAB — COMPREHENSIVE METABOLIC PANEL
ALBUMIN: 4.1 g/dL (ref 3.5–5.2)
ALK PHOS: 94 U/L (ref 39–117)
ALT: 16 U/L (ref 0–35)
AST: 14 U/L (ref 0–37)
BILIRUBIN TOTAL: 0.4 mg/dL (ref 0.2–1.2)
BUN: 15 mg/dL (ref 6–23)
CALCIUM: 9.5 mg/dL (ref 8.4–10.5)
CO2: 31 mEq/L (ref 19–32)
Chloride: 103 mEq/L (ref 96–112)
Creatinine, Ser: 0.81 mg/dL (ref 0.40–1.20)
GFR: 76.51 mL/min (ref >60.00)
GLUCOSE: 125 mg/dL — AB (ref 70–99)
POTASSIUM: 4.5 meq/L (ref 3.5–5.1)
Sodium: 141 mEq/L (ref 135–145)
TOTAL PROTEIN: 6.9 g/dL (ref 6.0–8.3)

## 2016-10-29 LAB — HEMOGLOBIN A1C: Hgb A1c MFr Bld: 7.5 % — ABNORMAL HIGH (ref 4.6–6.5)

## 2016-10-29 LAB — TSH: TSH: 1.47 u[IU]/mL (ref 0.35–4.50)

## 2016-10-29 MED ORDER — CLOTRIMAZOLE-BETAMETHASONE 1-0.05 % EX CREA: TOPICAL_CREAM | CUTANEOUS | 5 refills | Status: DC

## 2016-10-29 NOTE — Assessment & Plan Note (Signed)
Encouraged her to work on diet and exercise 

## 2016-10-29 NOTE — Assessment & Plan Note (Signed)
Elevated today She does not want take additional medications at this time An ACEI or ARB wold be preferable is she was agreeable Will monitor for now

## 2016-10-29 NOTE — Assessment & Plan Note (Signed)
Pending total hysterectomy

## 2016-10-29 NOTE — Progress Notes (Signed)
Subjective:    Patient ID: Melissa Cabrera, female    DOB: [birth year], 61 y.o.   MRN: [id number]  HPI  Pt presents to the clinic today for her annual exam. She is also due to follow up chronic conditions.  Seasonal Allergies: Worse in the. She takes Zyrtec and Flonase as needed with good relief.  Osteoarthritis: She is following with rheumatology, Dr. Haskel Khan. She is taking MTX as prescribed.  Psoriasis: Improved with MTX. She has Lidex cream to take as needed.  GERD: Triggered by stress. She denies breakthrough on Prilosec.  HLD: Her last LDL was 142, 05/2014. She is not taking any cholesterol lowering medication at this time. She tries to consume a low fat diet.  HTN: Her BP today is 138/84. She is taking Amlodipine daily as prescribed. She reports she gets extremely anxious every time she comes to the doctor. ECG from 09/2016 reviewed.  DM 2: Her last A1C was 6.6%. She is not taking any oral diabetic medication at this time. She does not check her sugars. She checks her feet daily and denies ulcerations. Her last eye exam was over 1 year ago.  Post Menopausal Bleeding: She recently had a D&C after endometrial biopsy showed atypical cells. She is scheduled for a total hysterectomy.  Flu: never Tetanus: > 10 years Pneumovax: never Zostavax: never Shingrix: never Pap Smear: 08/2016 Mammogram: 05/2014 Colon Screening: never Vision Screening: as needed Dentist: as needed  Diet: She eats a little meat. She consumes some fruits and veggies. She tries to avoid fried foods. She drinks mostly water. Exercise: None  Review of Systems  Past Medical History:  Diagnosis Date  . Allergy   . Arthritis   . Chicken pox   . GERD (gastroesophageal reflux disease)   . Heart murmur    DUE TO RHEUMATIC FEVER PER PT  . Hyperlipidemia   . Hypertension   . Pre-diabetes   . Psoriasis   . Rheumatic fever   . Rosacea     Current Outpatient Prescriptions  Medication Sig Dispense Refill    . amLODipine (NORVASC) 10 MG tablet TAKE 1 TABLET BY MOUTH ONCE DAILY 30 tablet 1  . betamethasone dipropionate (DIPROLENE) 0.05 % ointment Apply 1 application topically 2 (two) times daily. psoriasis    . Ca Carbonate-Mag Hydroxide (ROLAIDS PO) Take 1 tablet by mouth daily.    . cetirizine (ZYRTEC) 10 MG tablet Take 10 mg by mouth at bedtime.     . Cholecalciferol (VITAMIN D3) 400 UNITS CAPS Take 400 Units by mouth daily.     . clotrimazole-betamethasone (LOTRISONE) cream Apply 1 application topically 2 (two) times daily. 30 g 0  . clotrimazole-betamethasone (LOTRISONE) cream APPLY TOPICALLY AS DIRECTED TWICE DAILY 15 g 0  . diphenhydrAMINE (BENADRYL) 25 mg capsule Take 50 mg by mouth at bedtime.     . fluocinonide ointment (LIDEX) 2.68 % Apply 1 application topically 2 (two) times daily.    . fluticasone (FLONASE) 50 MCG/ACT nasal spray Place 1 spray into the nose at bedtime.     . folic acid (FOLVITE) 1 MG tablet Take 1 mg by mouth daily.    Marland Kitchen ibuprofen (ADVIL,MOTRIN) 200 MG tablet Take 800 mg by mouth at bedtime.     . methotrexate (RHEUMATREX) 2.5 MG tablet Take 15 mg by mouth once a week. Caution:Chemotherapy. Protect from light.    . metroNIDAZOLE (FLAGYL) 500 MG tablet Take 1 tablet (500 mg total) by mouth 2 (two) times daily. Begin 5 days prior to  scheduled surgery as directed. 10 tablet 0  . Omega-3 Fatty Acids (FISH OIL PO) Take 1 capsule by mouth 2 (two) times daily. 1040 mg of fish oil in each capsule    . omeprazole (PRILOSEC) 40 MG capsule TAKE 1 CAPSULE BY MOUTH ONCE DAILY *PATIENT MUST SCHEDULE ANNUAL EXAM AS WE HAVE NOT SEEN OVER 1 YEAR* 30 capsule 0  . salicylic acid 6 % gel Apply 1 application topically 2 (two) times daily.    . vitamin C (ASCORBIC ACID) 500 MG tablet Take 500 mg by mouth daily.     No current facility-administered medications for this visit.     No Known Allergies  Family History  Problem Relation Age of Onset  . Cancer Mother        skin  .  Dementia Mother   . Cancer Sister        skin and liver  . Stroke Maternal Grandmother   . Skin cancer Brother   . Diabetes Neg Hx   . Heart disease Neg Hx     Social History   Social History  . Marital status: Married    Spouse name: N/A  . Number of children: N/A  . Years of education: N/A   Occupational History  . Not on file.   Social History Main Topics  . Smoking status: Former Smoker    Packs/day: 4.00    Years: 16.00    Types: Cigarettes    Quit date: 09/12/1995  . Smokeless tobacco: Never Used     Comment: quit in 1997  . Alcohol use No  . Drug use: No  . Sexual activity: Yes    Birth control/ protection: None   Other Topics Concern  . Not on file   Social History Narrative  . No narrative on file     Constitutional: Denies fever, malaise, fatigue, headache or abrupt weight changes.  HEENT: Denies eye pain, eye redness, ear pain, ringing in the ears, wax buildup, runny nose, nasal congestion, bloody nose, or sore throat. Respiratory: Denies difficulty breathing, shortness of breath, cough or sputum production.   Cardiovascular: pt reports swelling of legs. Denies chest pain, chest tightness, palpitations or swelling in the hands.  Gastrointestinal: Pt reports reflux. Denies abdominal pain, bloating, constipation, diarrhea or blood in the stool.  GU: Pt reports uterine spotting. Denies urgency, frequency, pain with urination, burning sensation, blood in urine, odor or discharge. Musculoskeletal: Pt reports intermittent joint pain. Denies decrease in range of motion, difficulty with gait, muscle pain or joint swelling.  Skin: Pt reports lumps and rash. Denies redness, lesions or ulcercations.  Neurological: Denies dizziness, difficulty with memory, difficulty with speech or problems with balance and coordination.  Psych: Denies anxiety, depression, SI/HI.  No other specific complaints in a complete review of systems (except as listed in HPI above).      Objective:   Physical Exam   BP 138/84   Pulse 77   Temp 98 F (36.7 C) (Oral)   Ht 5' 1.5" (1.562 m)   Wt 245 lb 8 oz (111.4 kg)   SpO2 98%   BMI 45.64 kg/m  Wt Readings from Last 3 Encounters:  10/29/16 245 lb 8 oz (111.4 kg)  10/15/16 246 lb 4 oz (111.7 kg)  10/02/16 245 lb 1.6 oz (111.2 kg)    General: Appears her stated age, obese in NAD. Skin: Warm, dry and intact. Scaly plaque noted over lower back. Lipoma noted of right upper arm. HEENT: Head: normal shape  and size; Eyes: sclera white, no icterus, conjunctiva pink, PERRLA and EOMs intact; Ears: Tm's gray and intact, normal light reflex; Throat/Mouth: Mucosa pink and moist, no exudate, lesions or ulcerations noted.  Neck:  Neck supple, trachea midline. No masses, lumps or thyromegaly present.  Cardiovascular: Normal rate and rhythm. S1,S2 noted.  ? Slight murmru. No JVD or BLE edema. No carotid bruits noted. Pulmonary/Chest: Normal effort and positive vesicular breath sounds. No respiratory distress. No wheezes, rales or ronchi noted.  Abdomen: Soft and nontender. Normal bowel sounds. No distention or masses noted. Liver, spleen and kidneys non palpable. Musculoskeletal: Strength 5/5 BUE/BLE. No difficulty with gait.  Neurological: Alert and oriented. Cranial nerves II-XII grossly intact. Coordination normal.  Psychiatric: Mood and affect normal. Behavior is normal. Judgment and thought content normal.    BMET    Component Value Date/Time   NA 141 09/12/2016 0839   NA 143 01/07/2014   K 4.2 09/12/2016 0839   CL 104 09/12/2016 0839   CO2 29 09/12/2016 0839   GLUCOSE 139 (H) 09/12/2016 0839   BUN 26 (H) 09/12/2016 0839   CREATININE 0.86 09/12/2016 0839   CALCIUM 9.4 09/12/2016 0839   GFRNONAA >60 09/12/2016 0839   GFRAA >60 09/12/2016 0839    Lipid Panel     Component Value Date/Time   CHOL 217 (H) 05/16/2014 0844   TRIG 278.0 (H) 05/16/2014 0844   HDL 45.70 05/16/2014 0844   CHOLHDL 5 05/16/2014 0844    VLDL 55.6 (H) 05/16/2014 0844   LDLCALC 95 01/07/2014    CBC    Component Value Date/Time   WBC 8.0 09/12/2016 0839   RBC 4.89 09/12/2016 0839   HGB 14.1 09/12/2016 0839   HGB 14.5 08/16/2016 1200   HCT 42.2 09/12/2016 0839   HCT 42.8 08/16/2016 1200   PLT 328 09/12/2016 0839   PLT 297 08/16/2016 1200   MCV 86.1 09/12/2016 0839   MCV 86 08/16/2016 1200   MCH 28.8 09/12/2016 0839   MCHC 33.4 09/12/2016 0839   RDW 13.6 09/12/2016 0839   RDW 13.5 08/16/2016 1200    Hgb A1C Lab Results  Component Value Date   HGBA1C 6.6 (H) 05/16/2014           Assessment & Plan:   Preventative Health Maintenance:  Encouraged her to get a flu shot in the fall She declines tetanus, pneumovax, zostovax or shingrix Pap smear UTD She declines mammogram or colon cancer screening Encouraged her to consume a balanced diet and exercise regimen Advised her to see an eye doctor and dentist at least annually Will check CBC, CMET, Lipid, TSH, A1C, HIV and Hep C  RTC in 1 year, sooner if needed Webb Silversmith, NP

## 2016-10-29 NOTE — Assessment & Plan Note (Signed)
Improved on MTX Encouraged weight loss

## 2016-10-29 NOTE — Assessment & Plan Note (Signed)
Continue Zyrtec and Flonase as needed

## 2016-10-29 NOTE — Patient Instructions (Signed)
Health Maintenance for Postmenopausal Women Menopause is a normal process in which your reproductive ability comes to an end. This process happens gradually over a span of months to years, usually between the ages of 22 and 9. Menopause is complete when you have missed 12 consecutive menstrual periods. It is important to talk with your health care provider about some of the most common conditions that affect postmenopausal women, such as heart disease, cancer, and bone loss (osteoporosis). Adopting a healthy lifestyle and getting preventive care can help to promote your health and wellness. Those actions can also lower your chances of developing some of these common conditions. What should I know about menopause? During menopause, you may experience a number of symptoms, such as:  Moderate-to-severe hot flashes.  Night sweats.  Decrease in sex drive.  Mood swings.  Headaches.  Tiredness.  Irritability.  Memory problems.  Insomnia.  Choosing to treat or not to treat menopausal changes is an individual decision that you make with your health care provider. What should I know about hormone replacement therapy and supplements? Hormone therapy products are effective for treating symptoms that are associated with menopause, such as hot flashes and night sweats. Hormone replacement carries certain risks, especially as you become older. If you are thinking about using estrogen or estrogen with progestin treatments, discuss the benefits and risks with your health care provider. What should I know about heart disease and stroke? Heart disease, heart attack, and stroke become more likely as you age. This may be due, in part, to the hormonal changes that your body experiences during menopause. These can affect how your body processes dietary fats, triglycerides, and cholesterol. Heart attack and stroke are both medical emergencies. There are many things that you can do to help prevent heart disease  and stroke:  Have your blood pressure checked at least every 1-2 years. High blood pressure causes heart disease and increases the risk of stroke.  If you are 53-22 years old, ask your health care provider if you should take aspirin to prevent a heart attack or a stroke.  Do not use any tobacco products, including cigarettes, chewing tobacco, or electronic cigarettes. If you need help quitting, ask your health care provider.  It is important to eat a healthy diet and maintain a healthy weight. ? Be sure to include plenty of vegetables, fruits, low-fat dairy products, and lean protein. ? Avoid eating foods that are high in solid fats, added sugars, or salt (sodium).  Get regular exercise. This is one of the most important things that you can do for your health. ? Try to exercise for at least 150 minutes each week. The type of exercise that you do should increase your heart rate and make you sweat. This is known as moderate-intensity exercise. ? Try to do strengthening exercises at least twice each week. Do these in addition to the moderate-intensity exercise.  Know your numbers.Ask your health care provider to check your cholesterol and your blood glucose. Continue to have your blood tested as directed by your health care provider.  What should I know about cancer screening? There are several types of cancer. Take the following steps to reduce your risk and to catch any cancer development as early as possible. Breast Cancer  Practice breast self-awareness. ? This means understanding how your breasts normally appear and feel. ? It also means doing regular breast self-exams. Let your health care provider know about any changes, no matter how small.  If you are 40  or older, have a clinician do a breast exam (clinical breast exam or CBE) every year. Depending on your age, family history, and medical history, it may be recommended that you also have a yearly breast X-ray (mammogram).  If you  have a family history of breast cancer, talk with your health care provider about genetic screening.  If you are at high risk for breast cancer, talk with your health care provider about having an MRI and a mammogram every year.  Breast cancer (BRCA) gene test is recommended for women who have family members with BRCA-related cancers. Results of the assessment will determine the need for genetic counseling and BRCA1 and for BRCA2 testing. BRCA-related cancers include these types: ? Breast. This occurs in males or females. ? Ovarian. ? Tubal. This may also be called fallopian tube cancer. ? Cancer of the abdominal or pelvic lining (peritoneal cancer). ? Prostate. ? Pancreatic.  Cervical, Uterine, and Ovarian Cancer Your health care provider may recommend that you be screened regularly for cancer of the pelvic organs. These include your ovaries, uterus, and vagina. This screening involves a pelvic exam, which includes checking for microscopic changes to the surface of your cervix (Pap test).  For women ages 21-65, health care providers may recommend a pelvic exam and a Pap test every three years. For women ages 79-65, they may recommend the Pap test and pelvic exam, combined with testing for human papilloma virus (HPV), every five years. Some types of HPV increase your risk of cervical cancer. Testing for HPV may also be done on women of any age who have unclear Pap test results.  Other health care providers may not recommend any screening for nonpregnant women who are considered low risk for pelvic cancer and have no symptoms. Ask your health care provider if a screening pelvic exam is right for you.  If you have had past treatment for cervical cancer or a condition that could lead to cancer, you need Pap tests and screening for cancer for at least 20 years after your treatment. If Pap tests have been discontinued for you, your risk factors (such as having a new sexual partner) need to be  reassessed to determine if you should start having screenings again. Some women have medical problems that increase the chance of getting cervical cancer. In these cases, your health care provider may recommend that you have screening and Pap tests more often.  If you have a family history of uterine cancer or ovarian cancer, talk with your health care provider about genetic screening.  If you have vaginal bleeding after reaching menopause, tell your health care provider.  There are currently no reliable tests available to screen for ovarian cancer.  Lung Cancer Lung cancer screening is recommended for adults 69-62 years old who are at high risk for lung cancer because of a history of smoking. A yearly low-dose CT scan of the lungs is recommended if you:  Currently smoke.  Have a history of at least 30 pack-years of smoking and you currently smoke or have quit within the past 15 years. A pack-year is smoking an average of one pack of cigarettes per day for one year.  Yearly screening should:  Continue until it has been 15 years since you quit.  Stop if you develop a health problem that would prevent you from having lung cancer treatment.  Colorectal Cancer  This type of cancer can be detected and can often be prevented.  Routine colorectal cancer screening usually begins at  age 42 and continues through age 45.  If you have risk factors for colon cancer, your health care provider may recommend that you be screened at an earlier age.  If you have a family history of colorectal cancer, talk with your health care provider about genetic screening.  Your health care provider may also recommend using home test kits to check for hidden blood in your stool.  A small camera at the end of a tube can be used to examine your colon directly (sigmoidoscopy or colonoscopy). This is done to check for the earliest forms of colorectal cancer.  Direct examination of the colon should be repeated every  5-10 years until age 71. However, if early forms of precancerous polyps or small growths are found or if you have a family history or genetic risk for colorectal cancer, you may need to be screened more often.  Skin Cancer  Check your skin from head to toe regularly.  Monitor any moles. Be sure to tell your health care provider: ? About any new moles or changes in moles, especially if there is a change in a mole's shape or color. ? If you have a mole that is larger than the size of a pencil eraser.  If any of your family members has a history of skin cancer, especially at a young age, talk with your health care provider about genetic screening.  Always use sunscreen. Apply sunscreen liberally and repeatedly throughout the day.  Whenever you are outside, protect yourself by wearing long sleeves, pants, a wide-brimmed hat, and sunglasses.  What should I know about osteoporosis? Osteoporosis is a condition in which bone destruction happens more quickly than new bone creation. After menopause, you may be at an increased risk for osteoporosis. To help prevent osteoporosis or the bone fractures that can happen because of osteoporosis, the following is recommended:  If you are 46-71 years old, get at least 1,000 mg of calcium and at least 600 mg of vitamin D per day.  If you are older than age 55 but younger than age 65, get at least 1,200 mg of calcium and at least 600 mg of vitamin D per day.  If you are older than age 54, get at least 1,200 mg of calcium and at least 800 mg of vitamin D per day.  Smoking and excessive alcohol intake increase the risk of osteoporosis. Eat foods that are rich in calcium and vitamin D, and do weight-bearing exercises several times each week as directed by your health care provider. What should I know about how menopause affects my mental health? Depression may occur at any age, but it is more common as you become older. Common symptoms of depression  include:  Low or sad mood.  Changes in sleep patterns.  Changes in appetite or eating patterns.  Feeling an overall lack of motivation or enjoyment of activities that you previously enjoyed.  Frequent crying spells.  Talk with your health care provider if you think that you are experiencing depression. What should I know about immunizations? It is important that you get and maintain your immunizations. These include:  Tetanus, diphtheria, and pertussis (Tdap) booster vaccine.  Influenza every year before the flu season begins.  Pneumonia vaccine.  Shingles vaccine.  Your health care provider may also recommend other immunizations. This information is not intended to replace advice given to you by your health care provider. Make sure you discuss any questions you have with your health care provider. Document Released: 06/21/2005  Document Revised: 11/17/2015 Document Reviewed: 01/31/2015 Elsevier Interactive Patient Education  2018 [employer]  

## 2016-10-29 NOTE — Assessment & Plan Note (Signed)
Encouraged weight loss Encouraged stress reduction or better management Continue Prilosec for now

## 2016-10-29 NOTE — Assessment & Plan Note (Signed)
CMET and lipid profile today Encouraged her to consume a low fat diet and exercise for weight loss

## 2016-10-29 NOTE — Assessment & Plan Note (Signed)
A1C and microalbumin today Encouraged her to consume a low fat, low carb diet and exercise for weight loss She declines vaccines Foot exam today Advised her to make an appt for an eye exam

## 2016-10-29 NOTE — Assessment & Plan Note (Signed)
Improved on MTX Continue Lidex as needed

## 2016-10-30 LAB — HEPATITIS C ANTIBODY: HCV Ab: NEGATIVE

## 2016-10-30 LAB — HIV ANTIBODY (ROUTINE TESTING W REFLEX): HIV 1&2 Ab, 4th Generation: NONREACTIVE

## 2016-10-30 MED ORDER — GLIPIZIDE 5 MG PO TABS: 5.0000 mg | ORAL_TABLET | Freq: Every day | ORAL | 2 refills | Status: DC

## 2016-10-30 MED ORDER — SIMVASTATIN 10 MG PO TABS: 10.0000 mg | ORAL_TABLET | Freq: Every day | ORAL | 2 refills | Status: DC

## 2016-10-30 MED ORDER — LISINOPRIL 10 MG PO TABS: 10.0000 mg | ORAL_TABLET | Freq: Every day | ORAL | 2 refills | Status: DC

## 2016-10-30 NOTE — Addendum Note (Signed)
Addended by: Lurlean Nanny on: 10/30/2016 04:59 PM   Modules accepted: Orders

## 2016-11-05 DIAGNOSIS — M1990 Unspecified osteoarthritis, unspecified site: Secondary | ICD-10-CM | POA: Insufficient documentation

## 2016-11-05 DIAGNOSIS — M19011 Primary osteoarthritis, right shoulder: Secondary | ICD-10-CM | POA: Insufficient documentation

## 2016-11-05 DIAGNOSIS — M179 Osteoarthritis of knee, unspecified: Secondary | ICD-10-CM | POA: Insufficient documentation

## 2016-11-05 DIAGNOSIS — Z79899 Other long term (current) drug therapy: Secondary | ICD-10-CM | POA: Insufficient documentation

## 2016-11-07 ENCOUNTER — Encounter: Payer: Self-pay | Admitting: Obstetrics and Gynecology

## 2016-11-14 ENCOUNTER — Other Ambulatory Visit: Payer: Self-pay | Admitting: Internal Medicine

## 2016-11-22 ENCOUNTER — Other Ambulatory Visit: Payer: Self-pay | Admitting: Internal Medicine

## 2016-11-25 ENCOUNTER — Encounter
Admission: RE | Admit: 2016-11-25 | Discharge: 2016-11-25 | Disposition: A | Discharge status: Home / Self Care | Payer: BLUE CROSS/BLUE SHIELD | Source: Ambulatory Visit | Attending: Obstetrics and Gynecology | Admitting: Obstetrics and Gynecology

## 2016-11-25 DIAGNOSIS — Z780 Asymptomatic menopausal state: Secondary | ICD-10-CM | POA: Insufficient documentation

## 2016-11-25 DIAGNOSIS — Z79899 Other long term (current) drug therapy: Secondary | ICD-10-CM | POA: Insufficient documentation

## 2016-11-25 DIAGNOSIS — E119 Type 2 diabetes mellitus without complications: Secondary | ICD-10-CM | POA: Diagnosis not present

## 2016-11-25 DIAGNOSIS — K219 Gastro-esophageal reflux disease without esophagitis: Secondary | ICD-10-CM | POA: Diagnosis not present

## 2016-11-25 DIAGNOSIS — J302 Other seasonal allergic rhinitis: Secondary | ICD-10-CM | POA: Diagnosis not present

## 2016-11-25 DIAGNOSIS — L409 Psoriasis, unspecified: Secondary | ICD-10-CM | POA: Insufficient documentation

## 2016-11-25 DIAGNOSIS — E785 Hyperlipidemia, unspecified: Secondary | ICD-10-CM | POA: Insufficient documentation

## 2016-11-25 DIAGNOSIS — M1990 Unspecified osteoarthritis, unspecified site: Secondary | ICD-10-CM | POA: Insufficient documentation

## 2016-11-25 DIAGNOSIS — Z01812 Encounter for preprocedural laboratory examination: Secondary | ICD-10-CM | POA: Insufficient documentation

## 2016-11-25 DIAGNOSIS — I10 Essential (primary) hypertension: Secondary | ICD-10-CM | POA: Insufficient documentation

## 2016-11-25 HISTORY — DX: Type 2 diabetes mellitus without complications: E11.9

## 2016-11-25 NOTE — Patient Instructions (Signed)
Your procedure is scheduled on: December 04, 2016 [facility]) Report to Same Day Surgery 2nd floor medical mall ([place] Entrance-take elevator on left to 2nd floor.  Check in with surgery information desk.) To find out your arrival time please call [phone] between 1PM - 3PM on December 03, 2016 [facility]) Remember: Instructions that are not followed completely may result in serious medical risk, up to and including death, or upon the discretion of your surgeon and anesthesiologist your surgery may need to be rescheduled.    _x___ 1. Do not eat food or drink liquids after midnight. No gum chewing or hard  candies                            __x__ 2. No Alcohol for 24 hours before or after surgery.   __x__3. No Smoking for 24 prior to surgery.   ____  4. Bring all medications with you on the day of surgery if instructed.    __x__ 5. Notify your doctor if there is any change in your medical condition     (cold, fever, infections).     Do not wear jewelry, make-up, hairpins, clips or nail polish.  Do not wear lotions, powders, or perfumes.   Do not shave 48 hours prior to surgery. Men may shave face and neck.  Do not bring valuables to the hospital.    [facility] is not responsible for any belongings or valuables.               Contacts, dentures or bridgework may not be worn into surgery.  Leave your suitcase in the car. After surgery it may be brought to your room.  For patients admitted to the hospital, discharge time is determined by your treatment team                       Patients discharged the day of surgery will not be allowed to drive home.  You will need someone to drive you home and stay with you the night of your procedure.    Please read over the following fact sheets that you were given:   [facility] Preparing for Surgery and or MRSA Information   _x___ Take the following medication the morning of surgery with a sip of water :   1. OMEPRAZOLE    ____Fleets enema  or Magnesium Citrate as directed.   _x___ Use CHG Soap or sage wipes as directed on instruction sheet   ____ Use inhalers on the day of surgery and bring to hospital day of surgery  ____ Stop Metformin and Janumet 2 days prior to surgery.    ____ Take 1/2 of usual insulin dose the night before surgery and none on the morning surgery    .   _x___ Follow recommendations from Cardiologist, Pulmonologist or PCP regarding          stopping Aspirin, Coumadin, Pllavix ,Eliquis, Effient, or Pradaxa, and Pletal.  X____Stop Anti-inflammatories such as Advil, Aleve, Ibuprofen, Motrin, Naproxen, Naprosyn, Goodies powders or aspirin products. OK to take Tylenol   (PATIENT INSTRUCTED TO STOP METHOTREXATE  ON JULY  20  PER DR EVANS )              _x___ Stop supplements until after surgery.  But may continue Vitamin D, Vitamin B, and multivitamin (STOP VITAMIN C AND OMEGA-3 NOW)        ____ Bring C-Pap to  the hospital.

## 2016-11-27 ENCOUNTER — Encounter
Admission: RE | Admit: 2016-11-27 | Discharge: 2016-11-27 | Disposition: A | Discharge status: Home / Self Care | Payer: BLUE CROSS/BLUE SHIELD | Source: Ambulatory Visit | Attending: Obstetrics and Gynecology | Admitting: Obstetrics and Gynecology

## 2016-11-27 ENCOUNTER — Ambulatory Visit (INDEPENDENT_AMBULATORY_CARE_PROVIDER_SITE_OTHER): Payer: BLUE CROSS/BLUE SHIELD | Admitting: Obstetrics and Gynecology

## 2016-11-27 ENCOUNTER — Encounter: Payer: Self-pay | Admitting: Obstetrics and Gynecology

## 2016-11-27 VITALS — BP 150/82 | HR 73 | Temp 98.0°F | Resp 16 | Wt 217.2 lb

## 2016-11-27 DIAGNOSIS — N816 Rectocele: Secondary | ICD-10-CM

## 2016-11-27 DIAGNOSIS — N8502 Endometrial intraepithelial neoplasia [EIN]: Secondary | ICD-10-CM | POA: Diagnosis not present

## 2016-11-27 DIAGNOSIS — Z01818 Encounter for other preprocedural examination: Secondary | ICD-10-CM

## 2016-11-27 DIAGNOSIS — N8110 Cystocele, unspecified: Secondary | ICD-10-CM | POA: Diagnosis not present

## 2016-11-27 DIAGNOSIS — N393 Stress incontinence (female) (male): Secondary | ICD-10-CM

## 2016-11-27 DIAGNOSIS — Z01812 Encounter for preprocedural laboratory examination: Secondary | ICD-10-CM | POA: Diagnosis not present

## 2016-11-27 LAB — CBC
HCT: 38.7 % (ref 35.0–47.0)
HEMOGLOBIN: 13.3 g/dL (ref 12.0–16.0)
MCH: 30 pg (ref 26.0–34.0)
MCHC: 34.4 g/dL (ref 32.0–36.0)
MCV: 87.1 fL (ref 80.0–100.0)
PLATELETS: 325 10*3/uL (ref 150–440)
RBC: 4.44 MIL/uL (ref 3.80–5.20)
RDW: 14.5 % (ref 11.5–14.5)
WBC: 7.7 10*3/uL (ref 3.6–11.0)

## 2016-11-27 LAB — TYPE AND SCREEN
ABO/RH(D): A NEG
ANTIBODY SCREEN: NEGATIVE

## 2016-11-27 NOTE — H&P (Signed)
PRE-OPERATIVE HISTORY AND PHYSICAL EXAM  PCP:  [name], NP Subjective:   HPI:  Melissa Cabrera is a 61 y.o. G3P3.  No LMP recorded. Patient is postmenopausal.  She presents today for a pre-op discussion and PE.  She has the following symptoms:  EIN by  D&CFor postmenopausal bleeding. She also complains of daily urine loss especially with coughing laughing and sneezing. She is sexually active and would like to maintain sexual function. She has consulted with Dr. Suzy Bouchard in GYN oncology regarding appropriate surgery and lymph node sampling.  Review of Systems:   Constitutional: Denied constitutional symptoms, night sweats, recent illness, fatigue, fever, insomnia and weight loss.  Eyes: Denied eye symptoms, eye pain, photophobia, vision change and visual disturbance.  Ears/Nose/Throat/Neck: Denied ear, nose, throat or neck symptoms, hearing loss, nasal discharge, sinus congestion and sore throat.  Cardiovascular: Denied cardiovascular symptoms, arrhythmia, chest pain/pressure, edema, exercise intolerance, orthopnea and palpitations.  Respiratory: Denied pulmonary symptoms, asthma, pleuritic pain, productive sputum, cough, dyspnea and wheezing.  Gastrointestinal: Denied, gastro-esophageal reflux, melena, nausea and vomiting.  Genitourinary: See HPI for additional information.  Musculoskeletal: Denied musculoskeletal symptoms, stiffness, swelling, muscle weakness and myalgia.  Dermatologic: Denied dermatology symptoms, rash and scar.  Neurologic: Denied neurology symptoms, dizziness, headache, neck pain and syncope.  Psychiatric: Denied psychiatric symptoms, anxiety and depression.  Endocrine: Denied endocrine symptoms including hot flashes and night sweats.   OB History  Gravida Para Term Preterm AB Living  3 3       3   SAB TAB Ectopic Multiple Live Births          3    # Outcome Date GA Lbr Len/2nd Weight Sex Delivery Anes PTL Lv  3 Para      Vag-Spont   LIV  2 Para       Vag-Spont   LIV  1 Para      Vag-Spont   LIV      Past Medical History:  Diagnosis Date  . Allergy   . Arthritis   . Chicken pox   . Diabetes mellitus without complication ([place])   . GERD (gastroesophageal reflux disease)   . Heart murmur    DUE TO RHEUMATIC FEVER PER PT  . Hyperlipidemia   . Hypertension   . Psoriasis   . Rheumatic fever   . Rosacea     Past Surgical History:  Procedure Laterality Date  . DILATION AND CURETTAGE OF UTERUS N/A 09/16/2016   Procedure: DILATATION AND CURETTAGE;  Surgeon: Harlin Heys, MD;  Location: [facility] ORS;  Service: Gynecology;  Laterality: N/A;  . MOUTH SURGERY        SOCIAL HISTORY: History  Smoking Status  . Former Smoker  . Packs/day: 4.00  . Years: 16.00  . Types: Cigarettes  . Quit date: 09/12/1995  Smokeless Tobacco  . Never Used    Comment: quit in 1997   History  Alcohol Use No   History  Drug Use No    Family History  Problem Relation Age of Onset  . Cancer Mother        skin  . Dementia Mother   . Cancer Sister        skin and liver  . Stroke Maternal Grandmother   . Skin cancer Brother   . Diabetes Neg Hx   . Heart disease Neg Hx     ALLERGIES:  Patient has no known allergies.  MEDS:   Current Outpatient Prescriptions on File Prior  to Visit  Medication Sig Dispense Refill  . amLODipine (NORVASC) 10 MG tablet TAKE 1 TABLET BY MOUTH ONCE DAILY (Patient taking differently: TAKE 1 TABLET BY MOUTH ONCE DAILY BEDTIME) 90 tablet 2  . betamethasone dipropionate (DIPROLENE) 0.05 % ointment Apply 1 application topically 2 (two) times daily as needed. psoriasis     . calcium carbonate (TUMS EX) 750 MG chewable tablet Chew 750 mg by mouth daily.    . cetirizine (ZYRTEC) 10 MG tablet Take 10 mg by mouth at bedtime.     . Cholecalciferol (VITAMIN D3) 400 UNITS CAPS Take 400 Units by mouth daily.     . clotrimazole-betamethasone (LOTRISONE) cream APPLY TOPICALLY AS DIRECTED TWICE DAILY (Patient taking differently:  Apply 1 application topically 2 (two) times daily as needed (BACTERIAL INFECTION). APPLY TOPICALLY AS DIRECTED TWICE DAILY) 60 g 5  . diphenhydrAMINE (BENADRYL) 25 mg capsule Take 50 mg by mouth at bedtime.     . fluocinonide ointment (LIDEX) 7.89 % Apply 1 application topically 2 (two) times daily as needed (PSORIASIS BEHIND EARS).     . fluticasone (FLONASE) 50 MCG/ACT nasal spray Place 1 spray into both nostrils at bedtime.     . folic acid (FOLVITE) 1 MG tablet Take 1 mg by mouth daily.    Marland Kitchen glipiZIDE (GLUCOTROL) 5 MG tablet Take 1 tablet (5 mg total) by mouth daily before breakfast. 30 tablet 2  . ibuprofen (ADVIL,MOTRIN) 200 MG tablet Take 800 mg by mouth at bedtime as needed for moderate pain.     Marland Kitchen ketoconazole (NIZORAL) 2 % shampoo Apply 1 application topically 2 (two) times a week.    Marland Kitchen lisinopril (PRINIVIL,ZESTRIL) 10 MG tablet Take 1 tablet (10 mg total) by mouth daily. (Patient taking differently: Take 10 mg by mouth every evening. 5:00 pm) 30 tablet 2  . methotrexate (RHEUMATREX) 2.5 MG tablet Take 15 mg by mouth every Friday. Caution:Chemotherapy. Protect from light.     . metroNIDAZOLE (FLAGYL) 500 MG tablet Take 1 tablet (500 mg total) by mouth 2 (two) times daily. Begin 5 days prior to scheduled surgery as directed. 10 tablet 0  . Omega-3 Fatty Acids (FISH OIL PO) Take 1 capsule by mouth 2 (two) times daily. 1040 mg of fish oil in each capsule    . omeprazole (PRILOSEC) 40 MG capsule Take 1 capsule (40 mg total) by mouth daily. (Patient taking differently: Take 40 mg by mouth at bedtime. ) 90 capsule 2  . simvastatin (ZOCOR) 10 MG tablet Take 1 tablet (10 mg total) by mouth at bedtime. 30 tablet 2  . vitamin C (ASCORBIC ACID) 500 MG tablet Take 500 mg by mouth 2 (two) times daily.      No current facility-administered medications on file prior to visit.     No orders of the defined types were placed in this encounter.    Physical examination BP (!) 150/82 (BP Location: Right  Arm, Patient Position: Sitting, Cuff Size: Large)   Pulse 73   Temp 98 F (36.7 C)   Resp 16   Wt 217 lb 3.2 oz (98.5 kg)   BMI 41.04 kg/m   General NAD, Conversant  HEENT Atraumatic; Op clear with mmm.  Normo-cephalic. Pupils reactive. Anicteric sclerae  Thyroid/Neck Smooth without nodularity or enlargement. Normal ROM.  Neck Supple.  Skin No rashes, lesions or ulceration. Normal palpated skin turgor. No nodularity.  Breasts: No masses or discharge.  Symmetric.  No axillary adenopathy.  Lungs: Clear to auscultation.No rales or wheezes. Normal  Respiratory effort, no retractions.  Heart: NSR.  No murmurs or rubs appreciated. No periferal edema  Abdomen: Soft.  Non-tender.  No masses.  No HSM. No hernia  Extremities: Moves all appropriately.  Normal ROM for age. No lymphadenopathy.  Neuro: Oriented to PPT.  Normal mood. Normal affect.     Pelvic:   Vulva: Normal appearance.  No lesions.  Vagina: No lesions or abnormalities noted.  Support: Third degree cystocele second degree rectocele   Urethra No masses tenderness or scarring.  Meatus Normal size without lesions or prolapse.  Cervix: Normal ectropion.  No lesions.  Anus: Normal exam.  No lesions.  Perineum: Normal exam.  No lesions.        Bimanual   Uterus: Normal size.  Non-tender.  Mobile.  AV.  Adnexae: No masses.  Non-tender to palpation.  Cul-de-sac: Negative for abnormality.   Exam limited by patient body habitus  Assessment:   G3P3 Patient Active Problem List   Diagnosis Date Noted  . Postmenopausal bleeding 10/29/2016  . Type 2 diabetes mellitus without complication ([place]) 78/29/5621  . Arthritis 04/12/2014  . Gastroesophageal reflux disease without esophagitis 04/12/2014  . Seasonal allergies 04/12/2014  . Essential hypertension 04/12/2014  . HLD (hyperlipidemia) 04/12/2014  . Psoriasis 04/12/2014  . Severe obesity (BMI >= 40) ([place]) 04/12/2014    1. Preop examination   2. Endometrial intraepithelial  neoplasia (EIN)   3. SUI (stress urinary incontinence, female)   4. Cystocele with rectocele      Plan:   1.  LAVH BSO A&P repair TOT possible lymph node sampling.  Pre-op discussions regarding Risks and Benefits of her scheduled surgery.  LAVH The procedure of Laparoscopic Assisted Vaginal Hysterectomy was described to the patient in detail.  We reviewed the rationale for Hysterectomy and the patient was again informed of other nonsurgical management possibilities for her condition.  She has considered these other options, and desires a Hysterectomy.  We have reviewed the fact that Hysterectomy is permanent and that following the procedure she will not be able to become pregnant or bear children.  We have discussed the following risk factors specifically and the patient has also been informed that additional complications not mentioned may develop:  Damage to bowel, bladder, ureters or to other internal organs, bleeding, infection and the risk from anesthesia.  We have discussed the procedure itself in detail and she has an informed understanding of this surgery.  We have also discussed the recovery period in which physical and sexual activity will be restricted for a varying degree of time, often 3 - 6 weeks. The Laparoscopic Portion of Hysterectomy has also been reviewed with the patient.  She understands how the laparoscope facilitates the procedure.  We have discussed the abdominal incisions and punctures that will be used.  We have also reviewed the increased Operating Room time often accompanying LAVH.  The slightly increased risk of complications secondary to abdominal punctures, and use of laparoscopic instrumentation has also been discussed in detail.I have answered all of her questions and I believe the patient has an informed understanding of the procedure of Laparoscopic Assisted Vaginal Hysterectomy.  Oophorectomy The option of Oophorectomy has been discussed with the patient.   Detailed risk/benefits have been reviewed.  The risks discussed include, but are not limited to, hemorrhage, infection, damage to ureter or other internal organ, and Ovarian Remnant Syndrome.  The benefits include a significant decrease in the risk of Ovarian Cancer and in benign Ovarian disease.  The risk of  Ovarian CA has been estimated at 1 in 63.  This is a relatively small risk.  However, should Ovarian CA develop, it is often found late in the course of the disease.  We have also discussed the role of inheritance in the development of Ovarian disease.  Some women, who have close relatives with Ovarian CA, have a higher than 1 in 70 risk of Ovarian CA.  The benefits of Estrogen replacement therapy following Oophorectomy has been stressed.  If she is premenopausal, we have discussed the fact that this procedure will make her permanently sterile and that premature menopause will result if no ERT is begun.  I have answered all of her questions, and I believe that she has an adequate and informed understanding of the risks and benefits of Oophorectomy.  TOT I have discussed the procedure of tension free vaginal tape using the trans-obturator approach. (TOT).  I have informed the patient that this procedure often corrects or improves stress urinary incontinence.  For patients without urinary incontinence-this procedure is often performed to lower the risk of iatragenic urinary incontinence at the time of cystocele repair.The patient has been made aware that there is no guarantee of her improvement or the length of time her improvement will last.  The procedure itself was discussed in detail including possible damage to bowel, ureters, urethra and bladder.  I have informed her that the mesh is permanent.  The risk of extrusion of the mesh has also been reviewed.  The management of this complication has been discussed.  The risks of bleeding and infection were also reviewed.  I have specifically discussed the  complication of inability to void following the procedure and the patient is aware that she may go home using a Foley catheter or using a self-catheterization technique.  In addition, I have discussed with the patient the use of cystoscopy to diagnose bladder injury should there be any question of this.  Anterior Repair I have discussed the procedure of anterior repair and Kelly placation.  I have informed the patient that this procedure often corrects or improves stress urinary incontinence, but that there is certainly no guarantee of her improvement.  The procedure itself was discussed.  The possible damage to the bowel, ureters or urethra was also discussed.  We have reviewed the repositioning of the bladder that often takes place at anterior repair and I have informed her that although unlikely, it is possible that a worsening of her incontinence could occur after this procedure.  I have also discussed with her the necessity of decreased lifting and physical activity following the procedure as well as the possibility that as she gets older, her stress urinary incontinence could slowly return.  The use of vaginal Estrogen or oral Estrogen as well as other medications in the role of both stress and bladder dysynergia incontinence were discussed.  I have discussed the complication of inability to void immediately following the procedure.  The patient is aware that she may go home using a Foley catheter or may be taught the technique of self-catheterization should a complication develop.  All of her questions have been answered and I believe that she has an informed understanding of anterior repair/Kelly plication. Posterior Repair Posterior repair was discussed with the patient.  The risks were reviewed and include:  possible damage to rectum and bowel, bleeding, infection and anesthesia.  The benefits were also discussed.  Post-op recovery with special attention to hospital stay and return to sexual function  were specifically  reviewed.  All her questions were answered, and I believe that she has an informed understanding of Posterior repair.     Regarding the polypropylene mesh and mid-urethral slings: I have review the current position statements from [employer] 2014 and the [employer].  I have given her copies of both to review.  All of her questions were answered.  She has been advised that should she have any additional questions or concerns regarding her sling procedure we would be happy to schedule a time before her surgery to discuss them. All of the patient's questions have been answered and I believe she has an informed understanding of TOT and cystoscopy.   Finis Bud, M.D. 11/27/2016 10:31 AM

## 2016-11-27 NOTE — Progress Notes (Signed)
PRE-OPERATIVE HISTORY AND PHYSICAL EXAM  PCP:  [name], NP Subjective:   HPI:  Melissa Cabrera is a 61 y.o. G3P3.  No LMP recorded. Patient is postmenopausal.  She presents today for a pre-op discussion and PE.  She has the following symptoms:  EIN by  D&CFor postmenopausal bleeding. She also complains of daily urine loss especially with coughing laughing and sneezing. She is sexually active and would like to maintain sexual function. She has consulted with Dr. Suzy Bouchard in GYN oncology regarding appropriate surgery and lymph node sampling.  Review of Systems:   Constitutional: Denied constitutional symptoms, night sweats, recent illness, fatigue, fever, insomnia and weight loss.  Eyes: Denied eye symptoms, eye pain, photophobia, vision change and visual disturbance.  Ears/Nose/Throat/Neck: Denied ear, nose, throat or neck symptoms, hearing loss, nasal discharge, sinus congestion and sore throat.  Cardiovascular: Denied cardiovascular symptoms, arrhythmia, chest pain/pressure, edema, exercise intolerance, orthopnea and palpitations.  Respiratory: Denied pulmonary symptoms, asthma, pleuritic pain, productive sputum, cough, dyspnea and wheezing.  Gastrointestinal: Denied, gastro-esophageal reflux, melena, nausea and vomiting.  Genitourinary: See HPI for additional information.  Musculoskeletal: Denied musculoskeletal symptoms, stiffness, swelling, muscle weakness and myalgia.  Dermatologic: Denied dermatology symptoms, rash and scar.  Neurologic: Denied neurology symptoms, dizziness, headache, neck pain and syncope.  Psychiatric: Denied psychiatric symptoms, anxiety and depression.  Endocrine: Denied endocrine symptoms including hot flashes and night sweats.   OB History  Gravida Para Term Preterm AB Living  3 3       3   SAB TAB Ectopic Multiple Live Births          3    # Outcome Date GA Lbr Len/2nd Weight Sex Delivery Anes PTL Lv  3 Para      Vag-Spont   LIV  2 Para       Vag-Spont   LIV  1 Para      Vag-Spont   LIV      Past Medical History:  Diagnosis Date  . Allergy   . Arthritis   . Chicken pox   . Diabetes mellitus without complication ([place])   . GERD (gastroesophageal reflux disease)   . Heart murmur    DUE TO RHEUMATIC FEVER PER PT  . Hyperlipidemia   . Hypertension   . Psoriasis   . Rheumatic fever   . Rosacea     Past Surgical History:  Procedure Laterality Date  . DILATION AND CURETTAGE OF UTERUS N/A 09/16/2016   Procedure: DILATATION AND CURETTAGE;  Surgeon: Harlin Heys, MD;  Location: [facility] ORS;  Service: Gynecology;  Laterality: N/A;  . MOUTH SURGERY        SOCIAL HISTORY: History  Smoking Status  . Former Smoker  . Packs/day: 4.00  . Years: 16.00  . Types: Cigarettes  . Quit date: 09/12/1995  Smokeless Tobacco  . Never Used    Comment: quit in 1997   History  Alcohol Use No   History  Drug Use No    Family History  Problem Relation Age of Onset  . Cancer Mother        skin  . Dementia Mother   . Cancer Sister        skin and liver  . Stroke Maternal Grandmother   . Skin cancer Brother   . Diabetes Neg Hx   . Heart disease Neg Hx     ALLERGIES:  Patient has no known allergies.  MEDS:   Current Outpatient Prescriptions on File Prior  to Visit  Medication Sig Dispense Refill  . amLODipine (NORVASC) 10 MG tablet TAKE 1 TABLET BY MOUTH ONCE DAILY (Patient taking differently: TAKE 1 TABLET BY MOUTH ONCE DAILY BEDTIME) 90 tablet 2  . betamethasone dipropionate (DIPROLENE) 0.05 % ointment Apply 1 application topically 2 (two) times daily as needed. psoriasis     . calcium carbonate (TUMS EX) 750 MG chewable tablet Chew 750 mg by mouth daily.    . cetirizine (ZYRTEC) 10 MG tablet Take 10 mg by mouth at bedtime.     . Cholecalciferol (VITAMIN D3) 400 UNITS CAPS Take 400 Units by mouth daily.     . clotrimazole-betamethasone (LOTRISONE) cream APPLY TOPICALLY AS DIRECTED TWICE DAILY (Patient taking differently:  Apply 1 application topically 2 (two) times daily as needed (BACTERIAL INFECTION). APPLY TOPICALLY AS DIRECTED TWICE DAILY) 60 g 5  . diphenhydrAMINE (BENADRYL) 25 mg capsule Take 50 mg by mouth at bedtime.     . fluocinonide ointment (LIDEX) 2.40 % Apply 1 application topically 2 (two) times daily as needed (PSORIASIS BEHIND EARS).     . fluticasone (FLONASE) 50 MCG/ACT nasal spray Place 1 spray into both nostrils at bedtime.     . folic acid (FOLVITE) 1 MG tablet Take 1 mg by mouth daily.    Marland Kitchen glipiZIDE (GLUCOTROL) 5 MG tablet Take 1 tablet (5 mg total) by mouth daily before breakfast. 30 tablet 2  . ibuprofen (ADVIL,MOTRIN) 200 MG tablet Take 800 mg by mouth at bedtime as needed for moderate pain.     Marland Kitchen ketoconazole (NIZORAL) 2 % shampoo Apply 1 application topically 2 (two) times a week.    Marland Kitchen lisinopril (PRINIVIL,ZESTRIL) 10 MG tablet Take 1 tablet (10 mg total) by mouth daily. (Patient taking differently: Take 10 mg by mouth every evening. 5:00 pm) 30 tablet 2  . methotrexate (RHEUMATREX) 2.5 MG tablet Take 15 mg by mouth every Friday. Caution:Chemotherapy. Protect from light.     . metroNIDAZOLE (FLAGYL) 500 MG tablet Take 1 tablet (500 mg total) by mouth 2 (two) times daily. Begin 5 days prior to scheduled surgery as directed. 10 tablet 0  . Omega-3 Fatty Acids (FISH OIL PO) Take 1 capsule by mouth 2 (two) times daily. 1040 mg of fish oil in each capsule    . omeprazole (PRILOSEC) 40 MG capsule Take 1 capsule (40 mg total) by mouth daily. (Patient taking differently: Take 40 mg by mouth at bedtime. ) 90 capsule 2  . simvastatin (ZOCOR) 10 MG tablet Take 1 tablet (10 mg total) by mouth at bedtime. 30 tablet 2  . vitamin C (ASCORBIC ACID) 500 MG tablet Take 500 mg by mouth 2 (two) times daily.      No current facility-administered medications on file prior to visit.     No orders of the defined types were placed in this encounter.    Physical examination BP (!) 150/82 (BP Location: Right  Arm, Patient Position: Sitting, Cuff Size: Large)   Pulse 73   Temp 98 F (36.7 C)   Resp 16   Wt 217 lb 3.2 oz (98.5 kg)   BMI 41.04 kg/m   General NAD, Conversant  HEENT Atraumatic; Op clear with mmm.  Normo-cephalic. Pupils reactive. Anicteric sclerae  Thyroid/Neck Smooth without nodularity or enlargement. Normal ROM.  Neck Supple.  Skin No rashes, lesions or ulceration. Normal palpated skin turgor. No nodularity.  Breasts: No masses or discharge.  Symmetric.  No axillary adenopathy.  Lungs: Clear to auscultation.No rales or wheezes. Normal  Respiratory effort, no retractions.  Heart: NSR.  No murmurs or rubs appreciated. No periferal edema  Abdomen: Soft.  Non-tender.  No masses.  No HSM. No hernia  Extremities: Moves all appropriately.  Normal ROM for age. No lymphadenopathy.  Neuro: Oriented to PPT.  Normal mood. Normal affect.     Pelvic:   Vulva: Normal appearance.  No lesions.  Vagina: No lesions or abnormalities noted.  Support: Third degree cystocele second degree rectocele   Urethra No masses tenderness or scarring.  Meatus Normal size without lesions or prolapse.  Cervix: Normal ectropion.  No lesions.  Anus: Normal exam.  No lesions.  Perineum: Normal exam.  No lesions.        Bimanual   Uterus: Normal size.  Non-tender.  Mobile.  AV.  Adnexae: No masses.  Non-tender to palpation.  Cul-de-sac: Negative for abnormality.   Exam limited by patient body habitus  Assessment:   G3P3 Patient Active Problem List   Diagnosis Date Noted  . Postmenopausal bleeding 10/29/2016  . Type 2 diabetes mellitus without complication ([place]) 11/91/4782  . Arthritis 04/12/2014  . Gastroesophageal reflux disease without esophagitis 04/12/2014  . Seasonal allergies 04/12/2014  . Essential hypertension 04/12/2014  . HLD (hyperlipidemia) 04/12/2014  . Psoriasis 04/12/2014  . Severe obesity (BMI >= 40) ([place]) 04/12/2014    1. Preop examination   2. Endometrial intraepithelial  neoplasia (EIN)   3. SUI (stress urinary incontinence, female)   4. Cystocele with rectocele      Plan:   1.  LAVH BSO A&P repair TOT possible lymph node sampling.  Pre-op discussions regarding Risks and Benefits of her scheduled surgery.  LAVH The procedure of Laparoscopic Assisted Vaginal Hysterectomy was described to the patient in detail.  We reviewed the rationale for Hysterectomy and the patient was again informed of other nonsurgical management possibilities for her condition.  She has considered these other options, and desires a Hysterectomy.  We have reviewed the fact that Hysterectomy is permanent and that following the procedure she will not be able to become pregnant or bear children.  We have discussed the following risk factors specifically and the patient has also been informed that additional complications not mentioned may develop:  Damage to bowel, bladder, ureters or to other internal organs, bleeding, infection and the risk from anesthesia.  We have discussed the procedure itself in detail and she has an informed understanding of this surgery.  We have also discussed the recovery period in which physical and sexual activity will be restricted for a varying degree of time, often 3 - 6 weeks. The Laparoscopic Portion of Hysterectomy has also been reviewed with the patient.  She understands how the laparoscope facilitates the procedure.  We have discussed the abdominal incisions and punctures that will be used.  We have also reviewed the increased Operating Room time often accompanying LAVH.  The slightly increased risk of complications secondary to abdominal punctures, and use of laparoscopic instrumentation has also been discussed in detail.I have answered all of her questions and I believe the patient has an informed understanding of the procedure of Laparoscopic Assisted Vaginal Hysterectomy.  Oophorectomy The option of Oophorectomy has been discussed with the patient.   Detailed risk/benefits have been reviewed.  The risks discussed include, but are not limited to, hemorrhage, infection, damage to ureter or other internal organ, and Ovarian Remnant Syndrome.  The benefits include a significant decrease in the risk of Ovarian Cancer and in benign Ovarian disease.  The risk of  Ovarian CA has been estimated at 1 in 82.  This is a relatively small risk.  However, should Ovarian CA develop, it is often found late in the course of the disease.  We have also discussed the role of inheritance in the development of Ovarian disease.  Some women, who have close relatives with Ovarian CA, have a higher than 1 in 70 risk of Ovarian CA.  The benefits of Estrogen replacement therapy following Oophorectomy has been stressed.  If she is premenopausal, we have discussed the fact that this procedure will make her permanently sterile and that premature menopause will result if no ERT is begun.  I have answered all of her questions, and I believe that she has an adequate and informed understanding of the risks and benefits of Oophorectomy.  TOT I have discussed the procedure of tension free vaginal tape using the trans-obturator approach. (TOT).  I have informed the patient that this procedure often corrects or improves stress urinary incontinence.  For patients without urinary incontinence-this procedure is often performed to lower the risk of iatragenic urinary incontinence at the time of cystocele repair.The patient has been made aware that there is no guarantee of her improvement or the length of time her improvement will last.  The procedure itself was discussed in detail including possible damage to bowel, ureters, urethra and bladder.  I have informed her that the mesh is permanent.  The risk of extrusion of the mesh has also been reviewed.  The management of this complication has been discussed.  The risks of bleeding and infection were also reviewed.  I have specifically discussed the  complication of inability to void following the procedure and the patient is aware that she may go home using a Foley catheter or using a self-catheterization technique.  In addition, I have discussed with the patient the use of cystoscopy to diagnose bladder injury should there be any question of this.  Anterior Repair I have discussed the procedure of anterior repair and Kelly placation.  I have informed the patient that this procedure often corrects or improves stress urinary incontinence, but that there is certainly no guarantee of her improvement.  The procedure itself was discussed.  The possible damage to the bowel, ureters or urethra was also discussed.  We have reviewed the repositioning of the bladder that often takes place at anterior repair and I have informed her that although unlikely, it is possible that a worsening of her incontinence could occur after this procedure.  I have also discussed with her the necessity of decreased lifting and physical activity following the procedure as well as the possibility that as she gets older, her stress urinary incontinence could slowly return.  The use of vaginal Estrogen or oral Estrogen as well as other medications in the role of both stress and bladder dysynergia incontinence were discussed.  I have discussed the complication of inability to void immediately following the procedure.  The patient is aware that she may go home using a Foley catheter or may be taught the technique of self-catheterization should a complication develop.  All of her questions have been answered and I believe that she has an informed understanding of anterior repair/Kelly plication. Posterior Repair Posterior repair was discussed with the patient.  The risks were reviewed and include:  possible damage to rectum and bowel, bleeding, infection and anesthesia.  The benefits were also discussed.  Post-op recovery with special attention to hospital stay and return to sexual function  were specifically  reviewed.  All her questions were answered, and I believe that she has an informed understanding of Posterior repair.     Regarding the polypropylene mesh and mid-urethral slings: I have review the current position statements from AUGS 2014 and the AUA.  I have given her copies of both to review.  All of her questions were answered.  She has been advised that should she have any additional questions or concerns regarding her sling procedure we would be happy to schedule a time before her surgery to discuss them. All of the patient's questions have been answered and I believe she has an informed understanding of TOT and cystoscopy.   Finis Bud, M.D. 11/27/2016 10:31 AM

## 2016-12-03 MED ORDER — CEFOXITIN SODIUM-DEXTROSE 2-2.2 GM-% IV SOLR (PREMIX)
2.0000 g | INTRAVENOUS | Status: AC
  Administered 2016-12-04: 2 g via INTRAVENOUS

## 2016-12-04 ENCOUNTER — Inpatient Hospital Stay
Admission: AD | Admit: 2016-12-04 | Discharge: 2016-12-06 | DRG: 742 | Disposition: A | Discharge status: Home / Self Care | Payer: BLUE CROSS/BLUE SHIELD | Source: Ambulatory Visit | Attending: Obstetrics and Gynecology | Admitting: Obstetrics and Gynecology

## 2016-12-04 ENCOUNTER — Encounter: Payer: Self-pay | Admitting: *Deleted

## 2016-12-04 ENCOUNTER — Ambulatory Visit: Payer: BLUE CROSS/BLUE SHIELD | Admitting: Anesthesiology

## 2016-12-04 ENCOUNTER — Encounter
Admission: AD | Disposition: A | Discharge status: Home / Self Care | Payer: Self-pay | Source: Ambulatory Visit | Attending: Obstetrics and Gynecology

## 2016-12-04 DIAGNOSIS — N8502 Endometrial intraepithelial neoplasia [EIN]: Principal | ICD-10-CM | POA: Diagnosis present

## 2016-12-04 DIAGNOSIS — K219 Gastro-esophageal reflux disease without esophagitis: Secondary | ICD-10-CM | POA: Diagnosis present

## 2016-12-04 DIAGNOSIS — E119 Type 2 diabetes mellitus without complications: Secondary | ICD-10-CM | POA: Diagnosis present

## 2016-12-04 DIAGNOSIS — Z87891 Personal history of nicotine dependence: Secondary | ICD-10-CM

## 2016-12-04 DIAGNOSIS — I10 Essential (primary) hypertension: Secondary | ICD-10-CM | POA: Diagnosis present

## 2016-12-04 DIAGNOSIS — N393 Stress incontinence (female) (male): Secondary | ICD-10-CM | POA: Diagnosis present

## 2016-12-04 DIAGNOSIS — N950 Postmenopausal bleeding: Secondary | ICD-10-CM | POA: Diagnosis present

## 2016-12-04 DIAGNOSIS — Z6841 Body Mass Index (BMI) 40.0 and over, adult: Secondary | ICD-10-CM

## 2016-12-04 DIAGNOSIS — C541 Malignant neoplasm of endometrium: Secondary | ICD-10-CM | POA: Diagnosis present

## 2016-12-04 HISTORY — PX: ANTERIOR AND POSTERIOR REPAIR: SHX5121

## 2016-12-04 HISTORY — PX: LAPAROSCOPIC HYSTERECTOMY: SHX1926

## 2016-12-04 LAB — GLUCOSE, CAPILLARY
GLUCOSE-CAPILLARY: 105 mg/dL — AB (ref 65–99)
GLUCOSE-CAPILLARY: 231 mg/dL — AB (ref 65–99)

## 2016-12-04 SURGERY — HYSTERECTOMY, TOTAL, LAPAROSCOPIC
Anesthesia: General

## 2016-12-04 MED ORDER — PHENYLEPHRINE HCL 10 MG/ML IJ SOLN
INTRAMUSCULAR | Status: AC
  Filled 2016-12-04: qty 1

## 2016-12-04 MED ORDER — ROCURONIUM BROMIDE 100 MG/10ML IV SOLN
INTRAVENOUS | Status: DC | PRN
  Administered 2016-12-04: 30 mg via INTRAVENOUS
  Administered 2016-12-04: 45 mg via INTRAVENOUS
  Administered 2016-12-04: 10 mg via INTRAVENOUS
  Administered 2016-12-04: 5 mg via INTRAVENOUS

## 2016-12-04 MED ORDER — VASOPRESSIN 20 UNIT/ML IV SOLN
INTRAVENOUS | Status: DC | PRN
  Administered 2016-12-04: 24 mL via INTRAMUSCULAR

## 2016-12-04 MED ORDER — FENTANYL CITRATE (PF) 250 MCG/5ML IJ SOLN
INTRAMUSCULAR | Status: AC
  Filled 2016-12-04: qty 5

## 2016-12-04 MED ORDER — KETOROLAC TROMETHAMINE 30 MG/ML IJ SOLN: 30.0000 mg | Freq: Four times a day (QID) | INTRAMUSCULAR | Status: DC

## 2016-12-04 MED ORDER — FENTANYL CITRATE (PF) 100 MCG/2ML IJ SOLN
25.0000 ug | INTRAMUSCULAR | Status: DC | PRN
  Administered 2016-12-04 (×3): 25 ug via INTRAVENOUS

## 2016-12-04 MED ORDER — LISINOPRIL 10 MG PO TABS
10.0000 mg | ORAL_TABLET | Freq: Every day | ORAL | Status: DC
  Administered 2016-12-06: 10 mg via ORAL
  Filled 2016-12-04: qty 1

## 2016-12-04 MED ORDER — SUCCINYLCHOLINE CHLORIDE 20 MG/ML IJ SOLN
INTRAMUSCULAR | Status: DC | PRN
  Administered 2016-12-04: 100 mg via INTRAVENOUS

## 2016-12-04 MED ORDER — ROCURONIUM BROMIDE 50 MG/5ML IV SOLN
INTRAVENOUS | Status: AC
  Filled 2016-12-04: qty 1

## 2016-12-04 MED ORDER — FENTANYL CITRATE (PF) 100 MCG/2ML IJ SOLN
INTRAMUSCULAR | Status: AC
  Administered 2016-12-04: 25 ug via INTRAVENOUS
  Filled 2016-12-04: qty 2

## 2016-12-04 MED ORDER — KETOROLAC TROMETHAMINE 30 MG/ML IJ SOLN
30.0000 mg | Freq: Four times a day (QID) | INTRAMUSCULAR | Status: DC
  Administered 2016-12-04 – 2016-12-06 (×6): 30 mg via INTRAVENOUS
  Filled 2016-12-04 (×6): qty 1

## 2016-12-04 MED ORDER — FENTANYL CITRATE (PF) 100 MCG/2ML IJ SOLN
INTRAMUSCULAR | Status: DC | PRN
  Administered 2016-12-04: 100 ug via INTRAVENOUS
  Administered 2016-12-04: 25 ug via INTRAVENOUS
  Administered 2016-12-04: 100 ug via INTRAVENOUS

## 2016-12-04 MED ORDER — SODIUM CHLORIDE 0.9 % IJ SOLN
INTRAMUSCULAR | Status: AC
  Filled 2016-12-04: qty 50

## 2016-12-04 MED ORDER — BUPIVACAINE HCL (PF) 0.5 % IJ SOLN
INTRAMUSCULAR | Status: DC | PRN
  Administered 2016-12-04: 1 mL

## 2016-12-04 MED ORDER — ONDANSETRON HCL 4 MG PO TABS: 4.0000 mg | ORAL_TABLET | Freq: Four times a day (QID) | ORAL | Status: DC | PRN

## 2016-12-04 MED ORDER — HYDROMORPHONE HCL 1 MG/ML IJ SOLN
0.2000 mg | INTRAMUSCULAR | Status: DC | PRN
  Administered 2016-12-04: 0.2 mg via INTRAVENOUS
  Filled 2016-12-04: qty 1

## 2016-12-04 MED ORDER — INDOCYANINE GREEN 25 MG IV SOLR
INTRAVENOUS | Status: AC
  Filled 2016-12-04: qty 25

## 2016-12-04 MED ORDER — INDOCYANINE GREEN 25 MG IV SOLR
INTRAVENOUS | Status: DC | PRN
  Administered 2016-12-04: 25 mg

## 2016-12-04 MED ORDER — KETOROLAC TROMETHAMINE 30 MG/ML IJ SOLN
30.0000 mg | Freq: Once | INTRAMUSCULAR | Status: DC
  Filled 2016-12-04: qty 1

## 2016-12-04 MED ORDER — ACETAMINOPHEN 10 MG/ML IV SOLN
INTRAVENOUS | Status: DC | PRN
  Administered 2016-12-04: 1000 mg via INTRAVENOUS

## 2016-12-04 MED ORDER — PHENYLEPHRINE HCL 10 MG/ML IJ SOLN
INTRAMUSCULAR | Status: DC | PRN
  Administered 2016-12-04: 200 ug via INTRAVENOUS
  Administered 2016-12-04 (×2): 100 ug via INTRAVENOUS

## 2016-12-04 MED ORDER — SODIUM CHLORIDE 0.9 % IV SOLN
INTRAVENOUS | Status: DC | PRN
  Administered 2016-12-04: 50 ug/min via INTRAVENOUS

## 2016-12-04 MED ORDER — ONDANSETRON HCL 4 MG/2ML IJ SOLN: 4.0000 mg | Freq: Four times a day (QID) | INTRAMUSCULAR | Status: DC | PRN

## 2016-12-04 MED ORDER — PROPOFOL 10 MG/ML IV BOLUS
INTRAVENOUS | Status: AC
  Filled 2016-12-04: qty 20

## 2016-12-04 MED ORDER — ACETAMINOPHEN NICU IV SYRINGE 10 MG/ML
INTRAVENOUS | Status: AC
  Filled 2016-12-04: qty 1

## 2016-12-04 MED ORDER — DEXAMETHASONE SODIUM PHOSPHATE 10 MG/ML IJ SOLN
INTRAMUSCULAR | Status: DC | PRN
Start: 2016-12-04 — End: 2016-12-04
  Administered 2016-12-04: 10 mg via INTRAVENOUS

## 2016-12-04 MED ORDER — SODIUM CHLORIDE 0.9 % IJ SOLN
INTRAMUSCULAR | Status: AC
  Filled 2016-12-04: qty 10

## 2016-12-04 MED ORDER — VASOPRESSIN 20 UNIT/ML IV SOLN
INTRAVENOUS | Status: AC
  Filled 2016-12-04: qty 1

## 2016-12-04 MED ORDER — MIDAZOLAM HCL 2 MG/2ML IJ SOLN
INTRAMUSCULAR | Status: DC | PRN
Start: 2016-12-04 — End: 2016-12-04
  Administered 2016-12-04: 2 mg via INTRAVENOUS

## 2016-12-04 MED ORDER — SUGAMMADEX SODIUM 200 MG/2ML IV SOLN
INTRAVENOUS | Status: AC
  Filled 2016-12-04: qty 2

## 2016-12-04 MED ORDER — METHYLENE BLUE 0.5 % INJ SOLN
INTRAVENOUS | Status: AC
  Filled 2016-12-04: qty 10

## 2016-12-04 MED ORDER — ONDANSETRON HCL 4 MG/2ML IJ SOLN: 4.0000 mg | Freq: Once | INTRAMUSCULAR | Status: DC | PRN

## 2016-12-04 MED ORDER — SUCCINYLCHOLINE CHLORIDE 20 MG/ML IJ SOLN
INTRAMUSCULAR | Status: AC
  Filled 2016-12-04: qty 1

## 2016-12-04 MED ORDER — LIDOCAINE HCL (CARDIAC) 20 MG/ML IV SOLN
INTRAVENOUS | Status: DC | PRN
  Administered 2016-12-04: 50 mg via INTRAVENOUS

## 2016-12-04 MED ORDER — GLYCOPYRROLATE 0.2 MG/ML IJ SOLN
INTRAMUSCULAR | Status: AC
  Filled 2016-12-04: qty 1

## 2016-12-04 MED ORDER — SUGAMMADEX SODIUM 200 MG/2ML IV SOLN
INTRAVENOUS | Status: DC | PRN
  Administered 2016-12-04: 200 mg via INTRAVENOUS

## 2016-12-04 MED ORDER — IBUPROFEN 600 MG PO TABS
600.0000 mg | ORAL_TABLET | Freq: Four times a day (QID) | ORAL | Status: DC | PRN
  Administered 2016-12-06: 600 mg via ORAL
  Filled 2016-12-04: qty 1

## 2016-12-04 MED ORDER — LACTATED RINGERS IV SOLN: INTRAVENOUS | Status: DC

## 2016-12-04 MED ORDER — ONDANSETRON HCL 4 MG/2ML IJ SOLN
INTRAMUSCULAR | Status: AC
  Filled 2016-12-04: qty 2

## 2016-12-04 MED ORDER — AMLODIPINE BESYLATE 10 MG PO TABS
10.0000 mg | ORAL_TABLET | Freq: Every day | ORAL | Status: DC
  Administered 2016-12-06: 10 mg via ORAL
  Filled 2016-12-04 (×3): qty 1

## 2016-12-04 MED ORDER — ONDANSETRON HCL 4 MG/2ML IJ SOLN
INTRAMUSCULAR | Status: DC | PRN
  Administered 2016-12-04: 4 mg via INTRAVENOUS

## 2016-12-04 MED ORDER — CEFOXITIN SODIUM-DEXTROSE 2-2.2 GM-% IV SOLR (PREMIX)
INTRAVENOUS | Status: AC
  Filled 2016-12-04: qty 50

## 2016-12-04 MED ORDER — SIMETHICONE 80 MG PO CHEW
80.0000 mg | CHEWABLE_TABLET | Freq: Four times a day (QID) | ORAL | Status: DC | PRN
  Administered 2016-12-04: 80 mg via ORAL
  Filled 2016-12-04: qty 1

## 2016-12-04 MED ORDER — DEXTROSE IN LACTATED RINGERS 5 % IV SOLN
INTRAVENOUS | Status: DC
  Administered 2016-12-04 – 2016-12-06 (×6): via INTRAVENOUS

## 2016-12-04 MED ORDER — EPHEDRINE SULFATE 50 MG/ML IJ SOLN
INTRAMUSCULAR | Status: DC | PRN
  Administered 2016-12-04: 15 mg via INTRAVENOUS
  Administered 2016-12-04: 5 mg via INTRAVENOUS
  Administered 2016-12-04: 10 mg via INTRAVENOUS
  Administered 2016-12-04 (×2): 5 mg via INTRAVENOUS
  Administered 2016-12-04: 10 mg via INTRAVENOUS

## 2016-12-04 MED ORDER — KETOROLAC TROMETHAMINE 30 MG/ML IJ SOLN
INTRAMUSCULAR | Status: DC | PRN
  Administered 2016-12-04: 30 mg via INTRAVENOUS

## 2016-12-04 MED ORDER — PROPOFOL 10 MG/ML IV BOLUS
INTRAVENOUS | Status: DC | PRN
  Administered 2016-12-04: 200 mg via INTRAVENOUS

## 2016-12-04 MED ORDER — MIDAZOLAM HCL 2 MG/2ML IJ SOLN
INTRAMUSCULAR | Status: AC
  Filled 2016-12-04: qty 2

## 2016-12-04 MED ORDER — FENTANYL CITRATE (PF) 100 MCG/2ML IJ SOLN
INTRAMUSCULAR | Status: AC
  Filled 2016-12-04: qty 2

## 2016-12-04 MED ORDER — OXYCODONE-ACETAMINOPHEN 5-325 MG PO TABS
1.0000 | ORAL_TABLET | ORAL | Status: DC | PRN
  Administered 2016-12-04 – 2016-12-05 (×3): 2 via ORAL
  Administered 2016-12-05: 1 via ORAL
  Filled 2016-12-04 (×4): qty 2

## 2016-12-04 MED ORDER — SODIUM CHLORIDE 0.9 % IV SOLN
INTRAVENOUS | Status: DC
  Administered 2016-12-04: 07:00:00 via INTRAVENOUS

## 2016-12-04 MED ORDER — VASOPRESSIN 20 UNIT/ML IV SOLN
INTRAVENOUS | Status: DC | PRN
  Administered 2016-12-04 (×2): 1 [IU] via INTRAVENOUS

## 2016-12-04 MED ORDER — EPHEDRINE SULFATE 50 MG/ML IJ SOLN
INTRAMUSCULAR | Status: AC
  Filled 2016-12-04: qty 1

## 2016-12-04 MED ORDER — KETOROLAC TROMETHAMINE 30 MG/ML IJ SOLN
30.0000 mg | Freq: Four times a day (QID) | INTRAMUSCULAR | Status: DC
  Administered 2016-12-04: 30 mg via INTRAVENOUS

## 2016-12-04 MED ORDER — LIDOCAINE HCL (PF) 2 % IJ SOLN
INTRAMUSCULAR | Status: AC
  Filled 2016-12-04: qty 2

## 2016-12-04 MED ORDER — KETOROLAC TROMETHAMINE 30 MG/ML IJ SOLN
INTRAMUSCULAR | Status: AC
  Filled 2016-12-04: qty 1

## 2016-12-04 MED ORDER — DEXAMETHASONE SODIUM PHOSPHATE 10 MG/ML IJ SOLN
INTRAMUSCULAR | Status: AC
  Filled 2016-12-04: qty 1

## 2016-12-04 MED ORDER — BUPIVACAINE HCL (PF) 0.5 % IJ SOLN
INTRAMUSCULAR | Status: AC
  Filled 2016-12-04: qty 30

## 2016-12-04 SURGICAL SUPPLY — 122 items
ADHESIVE MASTISOL STRL (MISCELLANEOUS) IMPLANT
APPLICATOR SURGIFLO ENDO (HEMOSTASIS) IMPLANT
Aris Trans-obturator surgical kit ×4 IMPLANT
BAG DECANTER FOR FLEXI CONT (MISCELLANEOUS) ×4 IMPLANT
BAG URO DRAIN 2000ML W/SPOUT (MISCELLANEOUS) ×4 IMPLANT
BLADE SURG 15 STRL LF DISP TIS (BLADE) ×2 IMPLANT
BLADE SURG 15 STRL SS (BLADE) ×2
BLADE SURG 15 STRL SS SAFETY (BLADE) ×4 IMPLANT
BLADE SURG SZ10 CARB STEEL (BLADE) ×4 IMPLANT
CANISTER SUCT 1200ML W/VALVE (MISCELLANEOUS) ×4 IMPLANT
CANNULA DILATOR  5MM W/SLV (CANNULA)
CANNULA DILATOR 10 W/SLV (CANNULA) IMPLANT
CANNULA DILATOR 10MM W/SLV (CANNULA)
CANNULA DILATOR 5 W/SLV (CANNULA) IMPLANT
CATH FOLEY 2WAY  5CC 16FR (CATHETERS) ×2
CATH TRAY 16F METER LATEX (MISCELLANEOUS) IMPLANT
CATH URTH 16FR FL 2W BLN LF (CATHETERS) ×2 IMPLANT
CHLORAPREP W/TINT 26ML (MISCELLANEOUS) ×4 IMPLANT
CLEANER CAUTERY TIP 5X5 PAD (MISCELLANEOUS) IMPLANT
CLOSURE WOUND 1/2 X4 (GAUZE/BANDAGES/DRESSINGS) ×2
CNTNR SPEC 2.5X3XGRAD LEK (MISCELLANEOUS)
CONT SPEC 4OZ STER OR WHT (MISCELLANEOUS)
CONTAINER SPEC 2.5X3XGRAD LEK (MISCELLANEOUS) IMPLANT
CORD MONOPOLAR M/FML 12FT (MISCELLANEOUS) IMPLANT
COVER MAYO STAND STRL (DRAPES) IMPLANT
DEFOGGER SCOPE WARMER CLEARIFY (MISCELLANEOUS) ×4 IMPLANT
DERMABOND ADVANCED (GAUZE/BANDAGES/DRESSINGS) ×2
DERMABOND ADVANCED .7 DNX12 (GAUZE/BANDAGES/DRESSINGS) ×2 IMPLANT
DEVICE SUTURE ENDOST 10MM (ENDOMECHANICALS) IMPLANT
DRAPE LAP W/FLUID (DRAPES) IMPLANT
DRAPE LAPAROTOMY 100X77 ABD (DRAPES) IMPLANT
DRAPE LAPAROTOMY TRNSV 106X77 (MISCELLANEOUS) IMPLANT
DRAPE LEGGINS SURG 28X43 STRL (DRAPES) IMPLANT
DRAPE PERI LITHO V/GYN (MISCELLANEOUS) IMPLANT
DRAPE STERI POUCH LG 24X46 STR (DRAPES) IMPLANT
DRAPE UNDER BUTTOCK W/FLU (DRAPES) IMPLANT
DRAPE UTILITY 15X26 TOWEL STRL (DRAPES) IMPLANT
DRAPE XRAY CASSETTE 23X24 (DRAPES) IMPLANT
DRSG TEGADERM 2-3/8X2-3/4 SM (GAUZE/BANDAGES/DRESSINGS) IMPLANT
DRSG TELFA 3X8 NADH (GAUZE/BANDAGES/DRESSINGS) IMPLANT
ELECT BLADE 6 FLAT ULTRCLN (ELECTRODE) IMPLANT
ELECT CAUTERY BLADE 6.4 (BLADE) IMPLANT
ELECT REM PT RETURN 9FT ADLT (ELECTROSURGICAL) ×4
ELECTRODE REM PT RTRN 9FT ADLT (ELECTROSURGICAL) ×2 IMPLANT
ENDOSTITCH 0 SINGLE 48 (SUTURE) IMPLANT
GAUZE SPONGE 4X4 12PLY STRL (GAUZE/BANDAGES/DRESSINGS) IMPLANT
GAUZE SPONGE NON-WVN 2X2 STRL (MISCELLANEOUS) IMPLANT
GLOVE BIO SURGEON STRL SZ8 (GLOVE) ×20 IMPLANT
GLOVE INDICATOR 8.0 STRL GRN (GLOVE) ×8 IMPLANT
GLOVE PROTEXIS LATEX SZ 7.5 (GLOVE) ×8 IMPLANT
GOWN STRL REUS W/ TWL LRG LVL3 (GOWN DISPOSABLE) ×8 IMPLANT
GOWN STRL REUS W/ TWL XL LVL3 (GOWN DISPOSABLE) ×4 IMPLANT
GOWN STRL REUS W/TWL 2XL LVL3 (GOWN DISPOSABLE) ×4 IMPLANT
GOWN STRL REUS W/TWL LRG LVL3 (GOWN DISPOSABLE) ×8
GOWN STRL REUS W/TWL XL LVL3 (GOWN DISPOSABLE) ×4
GRASPER ENDO ROTC 5X36 (INSTRUMENTS) IMPLANT
HANDLE YANKAUER SUCT BULB TIP (MISCELLANEOUS) IMPLANT
IRRIGATION STRYKERFLOW (MISCELLANEOUS) IMPLANT
IRRIGATOR STRYKERFLOW (MISCELLANEOUS)
IV LACTATED RINGERS 1000ML (IV SOLUTION) IMPLANT
JELLY LUB 2OZ STRL (MISCELLANEOUS) ×2
JELLY LUBE 2OZ STRL (MISCELLANEOUS) ×2 IMPLANT
KIT PINK PAD W/HEAD ARE REST (MISCELLANEOUS) ×4
KIT PINK PAD W/HEAD ARM REST (MISCELLANEOUS) ×2 IMPLANT
KIT RM TURNOVER CYSTO AR (KITS) ×4 IMPLANT
LABEL OR SOLS (LABEL) IMPLANT
LIGASURE BLUNT 5MM 37CM (INSTRUMENTS) IMPLANT
LIGASURE IMPACT 36 18CM CVD LR (INSTRUMENTS) IMPLANT
LIGASURE LAP MARYLAND 5MM 37CM (ELECTROSURGICAL) ×4 IMPLANT
MANIPULATOR VCARE LG CRV RETR (MISCELLANEOUS) IMPLANT
MANIPULATOR VCARE SML CRV RETR (MISCELLANEOUS) IMPLANT
MANIPULATOR VCARE STD CRV RETR (MISCELLANEOUS) IMPLANT
NDL INSUFF ACCESS 14 VERSASTEP (NEEDLE) IMPLANT
NEEDLE HYPO 22GX1.5 SAFETY (NEEDLE) ×4 IMPLANT
NEEDLE SPNL 22GX5 LNG QUINC BK (NEEDLE) ×4 IMPLANT
NS IRRIG 1000ML POUR BTL (IV SOLUTION) ×4 IMPLANT
NS IRRIG 500ML POUR BTL (IV SOLUTION) ×4 IMPLANT
OCCLUDER COLPOPNEUMO (BALLOONS) IMPLANT
PACK BASIN MAJOR ARMC (MISCELLANEOUS) ×4 IMPLANT
PACK BASIN MINOR ARMC (MISCELLANEOUS) ×4 IMPLANT
PACK GYN LAPAROSCOPIC (MISCELLANEOUS) ×4 IMPLANT
PAD CLEANER CAUTERY TIP 5X5 (MISCELLANEOUS)
PAD OB MATERNITY 4.3X12.25 (PERSONAL CARE ITEMS) ×4 IMPLANT
PAD PREP 24X41 OB/GYN DISP (PERSONAL CARE ITEMS) ×4 IMPLANT
PENCIL ELECTRO HAND CTR (MISCELLANEOUS) IMPLANT
POUCH ENDO CATCH II 15MM (MISCELLANEOUS) IMPLANT
SET CYSTO W/LG BORE CLAMP LF (SET/KITS/TRAYS/PACK) IMPLANT
SHEARS ENDO 5MM 31CM (CUTTER) IMPLANT
SLEEVE ENDOPATH XCEL 5M (ENDOMECHANICALS) ×4 IMPLANT
SLING MESH NAB TRANSOBTURATOR (MISCELLANEOUS) ×3 IMPLANT
SPOGE SURGIFLO 8M (HEMOSTASIS)
SPONGE LAP 18X18 5 PK (GAUZE/BANDAGES/DRESSINGS) ×4 IMPLANT
SPONGE LAP 4X18 5PK (MISCELLANEOUS) ×4 IMPLANT
SPONGE SURGIFLO 8M (HEMOSTASIS) IMPLANT
SPONGE VERSALON 2X2 STRL (MISCELLANEOUS)
SPONGE XRAY 4X4 16PLY STRL (MISCELLANEOUS) ×4 IMPLANT
STAPLER SKIN PROX 35W (STAPLE) IMPLANT
STRIP CLOSURE SKIN 1/2X4 (GAUZE/BANDAGES/DRESSINGS) ×6 IMPLANT
SUT ENDO VLOC 180-0-8IN (SUTURE) IMPLANT
SUT MAXON ABS #0 GS21 30IN (SUTURE) IMPLANT
SUT PDS AB 1 TP1 96 (SUTURE) IMPLANT
SUT PLAIN 2 0 XLH (SUTURE) IMPLANT
SUT VIC AB 0 CT1 27 (SUTURE) ×4
SUT VIC AB 0 CT1 27XCR 8 STRN (SUTURE) ×4 IMPLANT
SUT VIC AB 0 CT1 36 (SUTURE) ×8 IMPLANT
SUT VIC AB 2-0 UR6 27 (SUTURE) IMPLANT
SUT VIC AB 4-0 FS2 27 (SUTURE) ×4 IMPLANT
SUT VICRYL 0 AB UR-6 (SUTURE) ×16 IMPLANT
SUT VICRYL AB 3-0 FS1 BRD 27IN (SUTURE) IMPLANT
SUTURE VIC 1-0 (SUTURE) IMPLANT
SYR 3ML LL SCALE MARK (SYRINGE) ×8 IMPLANT
SYR 50ML LL SCALE MARK (SYRINGE) IMPLANT
SYR BULB IRRIG 60ML STRL (SYRINGE) IMPLANT
SYRINGE 10CC LL (SYRINGE) ×4 IMPLANT
TOWEL OR 17X26 4PK STRL BLUE (TOWEL DISPOSABLE) ×4 IMPLANT
TROCAR BLUNT TIP 12MM OMST12BT (TROCAR) ×4 IMPLANT
TROCAR VERSASTEP PLUS 12MM (TROCAR) IMPLANT
TROCAR VERSASTEP PLUS 5MM (TROCAR) IMPLANT
TROCAR XCEL NON-BLD 5MMX100MML (ENDOMECHANICALS) ×4 IMPLANT
TUBING CONNECTING 10 (TUBING) IMPLANT
TUBING CONNECTING 10' (TUBING)
TUBING INSUF HEATED (TUBING) ×4 IMPLANT

## 2016-12-04 NOTE — Interval H&P Note (Signed)
History and Physical Interval Note:  12/04/2016 7:27 AM  Melissa Cabrera  has presented today for surgery, with the diagnosis of ENDOMETRIAL HYPERPLASIA,URINARY INCONTINENCE  The various methods of treatment have been discussed with the patient and family. After consideration of risks, benefits and other options for treatment, the patient has consented to  Procedure(s): HYSTERECTOMY TOTAL LAPAROSCOPIC WITH BILATERAL SALPINGO OOPHERECTOMY (Bilateral) ANTERIOR (CYSTOCELE) AND POSTERIOR REPAIR (RECTOCELE) (N/A) SENTINEL NODE BIOPSY (N/A) PELVIC LYMPH NODE DISSECTION (N/A) as a surgical intervention .  The patient's history has been reviewed, patient examined, no change in status, stable for surgery.  I have reviewed the patient's chart and labs.  Questions were answered to the patient's satisfaction.     Jeannie Fend

## 2016-12-04 NOTE — Anesthesia Post-op Follow-up Note (Cosign Needed)
Anesthesia QCDR form completed.        

## 2016-12-04 NOTE — Anesthesia Procedure Notes (Signed)
Procedure Name: Intubation Date/Time: 12/04/2016 7:44 AM Performed by: Darlyne Russian Pre-anesthesia Checklist: Patient identified, Emergency Drugs available, Suction available, Patient being monitored and Timeout performed Patient Re-evaluated:Patient Re-evaluated prior to induction Oxygen Delivery Method: Circle system utilized Preoxygenation: Pre-oxygenation with 100% oxygen Induction Type: IV induction Ventilation: Mask ventilation without difficulty Laryngoscope Size: Mac and 3 Grade View: Grade II Tube type: Oral Tube size: 6.0 mm Number of attempts: 1 Airway Equipment and Method: Stylet Secured at: 21 cm Tube secured with: Tape Dental Injury: Teeth and Oropharynx as per pre-operative assessment

## 2016-12-04 NOTE — Transfer of Care (Signed)
Immediate Anesthesia Transfer of Care Note  Patient: Melissa Cabrera  Procedure(s) Performed: Procedure(s): HYSTERECTOMY TOTAL LAPAROSCOPIC WITH BILATERAL SALPINGO OOPHERECTOMY (Bilateral) ANTERIOR (CYSTOCELE) AND POSTERIOR REPAIR (RECTOCELE), TRANSOBTURATOR SLING PLACEMENT(ARIS) (N/A)  Patient Location: PACU  Anesthesia Type:General  Level of Consciousness: drowsy, patient cooperative and responds to stimulation  Airway & Oxygen Therapy: Patient Spontanous Breathing and Patient connected to nasal cannula oxygen  Post-op Assessment: Report given to RN and Post -op Vital signs reviewed and stable  Post vital signs: Reviewed and stable  Last Vitals:  Vitals:   12/04/16 0611 12/04/16 1039  BP: (!) 147/69 (!) 120/32  Pulse: 78 98  Resp: 16 19  Temp: 36.7 C (!) 36.2 C    Last Pain:  Vitals:   12/04/16 1039  TempSrc: Temporal         Complications: No apparent anesthesia complications

## 2016-12-04 NOTE — Progress Notes (Signed)
Called Dr.Evans to get clarification on foley orders. Foley orders to start 12/05/16 in AM. Notified about BP 110/49, 110/42 order to hold BP medications obtained per MD okay to give dilaudid at this time for severe pain.

## 2016-12-04 NOTE — H&P (View-Only) (Signed)
PRE-OPERATIVE HISTORY AND PHYSICAL EXAM  PCP:  [name], NP Subjective:   HPI:  Melissa Cabrera is a 61 y.o. G3P3.  No LMP recorded. Patient is postmenopausal.  She presents today for a pre-op discussion and PE.  She has the following symptoms:  EIN by  D&CFor postmenopausal bleeding. She also complains of daily urine loss especially with coughing laughing and sneezing. She is sexually active and would like to maintain sexual function. She has consulted with Dr. Suzy Bouchard in GYN oncology regarding appropriate surgery and lymph node sampling.  Review of Systems:   Constitutional: Denied constitutional symptoms, night sweats, recent illness, fatigue, fever, insomnia and weight loss.  Eyes: Denied eye symptoms, eye pain, photophobia, vision change and visual disturbance.  Ears/Nose/Throat/Neck: Denied ear, nose, throat or neck symptoms, hearing loss, nasal discharge, sinus congestion and sore throat.  Cardiovascular: Denied cardiovascular symptoms, arrhythmia, chest pain/pressure, edema, exercise intolerance, orthopnea and palpitations.  Respiratory: Denied pulmonary symptoms, asthma, pleuritic pain, productive sputum, cough, dyspnea and wheezing.  Gastrointestinal: Denied, gastro-esophageal reflux, melena, nausea and vomiting.  Genitourinary: See HPI for additional information.  Musculoskeletal: Denied musculoskeletal symptoms, stiffness, swelling, muscle weakness and myalgia.  Dermatologic: Denied dermatology symptoms, rash and scar.  Neurologic: Denied neurology symptoms, dizziness, headache, neck pain and syncope.  Psychiatric: Denied psychiatric symptoms, anxiety and depression.  Endocrine: Denied endocrine symptoms including hot flashes and night sweats.   OB History  Gravida Para Term Preterm AB Living  3 3       3   SAB TAB Ectopic Multiple Live Births          3    # Outcome Date GA Lbr Len/2nd Weight Sex Delivery Anes PTL Lv  3 Para      Vag-Spont   LIV  2 Para       Vag-Spont   LIV  1 Para      Vag-Spont   LIV      Past Medical History:  Diagnosis Date  . Allergy   . Arthritis   . Chicken pox   . Diabetes mellitus without complication ([place])   . GERD (gastroesophageal reflux disease)   . Heart murmur    DUE TO RHEUMATIC FEVER PER PT  . Hyperlipidemia   . Hypertension   . Psoriasis   . Rheumatic fever   . Rosacea     Past Surgical History:  Procedure Laterality Date  . DILATION AND CURETTAGE OF UTERUS N/A 09/16/2016   Procedure: DILATATION AND CURETTAGE;  Surgeon: Harlin Heys, MD;  Location: [facility] ORS;  Service: Gynecology;  Laterality: N/A;  . MOUTH SURGERY        SOCIAL HISTORY: History  Smoking Status  . Former Smoker  . Packs/day: 4.00  . Years: 16.00  . Types: Cigarettes  . Quit date: 09/12/1995  Smokeless Tobacco  . Never Used    Comment: quit in 1997   History  Alcohol Use No   History  Drug Use No    Family History  Problem Relation Age of Onset  . Cancer Mother        skin  . Dementia Mother   . Cancer Sister        skin and liver  . Stroke Maternal Grandmother   . Skin cancer Brother   . Diabetes Neg Hx   . Heart disease Neg Hx     ALLERGIES:  Patient has no known allergies.  MEDS:   Current Outpatient Prescriptions on File Prior  to Visit  Medication Sig Dispense Refill  . amLODipine (NORVASC) 10 MG tablet TAKE 1 TABLET BY MOUTH ONCE DAILY (Patient taking differently: TAKE 1 TABLET BY MOUTH ONCE DAILY BEDTIME) 90 tablet 2  . betamethasone dipropionate (DIPROLENE) 0.05 % ointment Apply 1 application topically 2 (two) times daily as needed. psoriasis     . calcium carbonate (TUMS EX) 750 MG chewable tablet Chew 750 mg by mouth daily.    . cetirizine (ZYRTEC) 10 MG tablet Take 10 mg by mouth at bedtime.     . Cholecalciferol (VITAMIN D3) 400 UNITS CAPS Take 400 Units by mouth daily.     . clotrimazole-betamethasone (LOTRISONE) cream APPLY TOPICALLY AS DIRECTED TWICE DAILY (Patient taking differently:  Apply 1 application topically 2 (two) times daily as needed (BACTERIAL INFECTION). APPLY TOPICALLY AS DIRECTED TWICE DAILY) 60 g 5  . diphenhydrAMINE (BENADRYL) 25 mg capsule Take 50 mg by mouth at bedtime.     . fluocinonide ointment (LIDEX) 9.98 % Apply 1 application topically 2 (two) times daily as needed (PSORIASIS BEHIND EARS).     . fluticasone (FLONASE) 50 MCG/ACT nasal spray Place 1 spray into both nostrils at bedtime.     . folic acid (FOLVITE) 1 MG tablet Take 1 mg by mouth daily.    Marland Kitchen glipiZIDE (GLUCOTROL) 5 MG tablet Take 1 tablet (5 mg total) by mouth daily before breakfast. 30 tablet 2  . ibuprofen (ADVIL,MOTRIN) 200 MG tablet Take 800 mg by mouth at bedtime as needed for moderate pain.     Marland Kitchen ketoconazole (NIZORAL) 2 % shampoo Apply 1 application topically 2 (two) times a week.    Marland Kitchen lisinopril (PRINIVIL,ZESTRIL) 10 MG tablet Take 1 tablet (10 mg total) by mouth daily. (Patient taking differently: Take 10 mg by mouth every evening. 5:00 pm) 30 tablet 2  . methotrexate (RHEUMATREX) 2.5 MG tablet Take 15 mg by mouth every Friday. Caution:Chemotherapy. Protect from light.     . metroNIDAZOLE (FLAGYL) 500 MG tablet Take 1 tablet (500 mg total) by mouth 2 (two) times daily. Begin 5 days prior to scheduled surgery as directed. 10 tablet 0  . Omega-3 Fatty Acids (FISH OIL PO) Take 1 capsule by mouth 2 (two) times daily. 1040 mg of fish oil in each capsule    . omeprazole (PRILOSEC) 40 MG capsule Take 1 capsule (40 mg total) by mouth daily. (Patient taking differently: Take 40 mg by mouth at bedtime. ) 90 capsule 2  . simvastatin (ZOCOR) 10 MG tablet Take 1 tablet (10 mg total) by mouth at bedtime. 30 tablet 2  . vitamin C (ASCORBIC ACID) 500 MG tablet Take 500 mg by mouth 2 (two) times daily.      No current facility-administered medications on file prior to visit.     No orders of the defined types were placed in this encounter.    Physical examination BP (!) 150/82 (BP Location: Right  Arm, Patient Position: Sitting, Cuff Size: Large)   Pulse 73   Temp 98 F (36.7 C)   Resp 16   Wt 217 lb 3.2 oz (98.5 kg)   BMI 41.04 kg/m   General NAD, Conversant  HEENT Atraumatic; Op clear with mmm.  Normo-cephalic. Pupils reactive. Anicteric sclerae  Thyroid/Neck Smooth without nodularity or enlargement. Normal ROM.  Neck Supple.  Skin No rashes, lesions or ulceration. Normal palpated skin turgor. No nodularity.  Breasts: No masses or discharge.  Symmetric.  No axillary adenopathy.  Lungs: Clear to auscultation.No rales or wheezes. Normal  Respiratory effort, no retractions.  Heart: NSR.  No murmurs or rubs appreciated. No periferal edema  Abdomen: Soft.  Non-tender.  No masses.  No HSM. No hernia  Extremities: Moves all appropriately.  Normal ROM for age. No lymphadenopathy.  Neuro: Oriented to PPT.  Normal mood. Normal affect.     Pelvic:   Vulva: Normal appearance.  No lesions.  Vagina: No lesions or abnormalities noted.  Support: Third degree cystocele second degree rectocele   Urethra No masses tenderness or scarring.  Meatus Normal size without lesions or prolapse.  Cervix: Normal ectropion.  No lesions.  Anus: Normal exam.  No lesions.  Perineum: Normal exam.  No lesions.        Bimanual   Uterus: Normal size.  Non-tender.  Mobile.  AV.  Adnexae: No masses.  Non-tender to palpation.  Cul-de-sac: Negative for abnormality.   Exam limited by patient body habitus  Assessment:   G3P3 Patient Active Problem List   Diagnosis Date Noted  . Postmenopausal bleeding 10/29/2016  . Type 2 diabetes mellitus without complication ([place]) 92/03/9416  . Arthritis 04/12/2014  . Gastroesophageal reflux disease without esophagitis 04/12/2014  . Seasonal allergies 04/12/2014  . Essential hypertension 04/12/2014  . HLD (hyperlipidemia) 04/12/2014  . Psoriasis 04/12/2014  . Severe obesity (BMI >= 40) ([place]) 04/12/2014    1. Preop examination   2. Endometrial intraepithelial  neoplasia (EIN)   3. SUI (stress urinary incontinence, female)   4. Cystocele with rectocele      Plan:   1.  LAVH BSO A&P repair TOT possible lymph node sampling.  Pre-op discussions regarding Risks and Benefits of her scheduled surgery.  LAVH The procedure of Laparoscopic Assisted Vaginal Hysterectomy was described to the patient in detail.  We reviewed the rationale for Hysterectomy and the patient was again informed of other nonsurgical management possibilities for her condition.  She has considered these other options, and desires a Hysterectomy.  We have reviewed the fact that Hysterectomy is permanent and that following the procedure she will not be able to become pregnant or bear children.  We have discussed the following risk factors specifically and the patient has also been informed that additional complications not mentioned may develop:  Damage to bowel, bladder, ureters or to other internal organs, bleeding, infection and the risk from anesthesia.  We have discussed the procedure itself in detail and she has an informed understanding of this surgery.  We have also discussed the recovery period in which physical and sexual activity will be restricted for a varying degree of time, often 3 - 6 weeks. The Laparoscopic Portion of Hysterectomy has also been reviewed with the patient.  She understands how the laparoscope facilitates the procedure.  We have discussed the abdominal incisions and punctures that will be used.  We have also reviewed the increased Operating Room time often accompanying LAVH.  The slightly increased risk of complications secondary to abdominal punctures, and use of laparoscopic instrumentation has also been discussed in detail.I have answered all of her questions and I believe the patient has an informed understanding of the procedure of Laparoscopic Assisted Vaginal Hysterectomy.  Oophorectomy The option of Oophorectomy has been discussed with the patient.   Detailed risk/benefits have been reviewed.  The risks discussed include, but are not limited to, hemorrhage, infection, damage to ureter or other internal organ, and Ovarian Remnant Syndrome.  The benefits include a significant decrease in the risk of Ovarian Cancer and in benign Ovarian disease.  The risk of  Ovarian CA has been estimated at 1 in 43.  This is a relatively small risk.  However, should Ovarian CA develop, it is often found late in the course of the disease.  We have also discussed the role of inheritance in the development of Ovarian disease.  Some women, who have close relatives with Ovarian CA, have a higher than 1 in 70 risk of Ovarian CA.  The benefits of Estrogen replacement therapy following Oophorectomy has been stressed.  If she is premenopausal, we have discussed the fact that this procedure will make her permanently sterile and that premature menopause will result if no ERT is begun.  I have answered all of her questions, and I believe that she has an adequate and informed understanding of the risks and benefits of Oophorectomy.  TOT I have discussed the procedure of tension free vaginal tape using the trans-obturator approach. (TOT).  I have informed the patient that this procedure often corrects or improves stress urinary incontinence.  For patients without urinary incontinence-this procedure is often performed to lower the risk of iatragenic urinary incontinence at the time of cystocele repair.The patient has been made aware that there is no guarantee of her improvement or the length of time her improvement will last.  The procedure itself was discussed in detail including possible damage to bowel, ureters, urethra and bladder.  I have informed her that the mesh is permanent.  The risk of extrusion of the mesh has also been reviewed.  The management of this complication has been discussed.  The risks of bleeding and infection were also reviewed.  I have specifically discussed the  complication of inability to void following the procedure and the patient is aware that she may go home using a Foley catheter or using a self-catheterization technique.  In addition, I have discussed with the patient the use of cystoscopy to diagnose bladder injury should there be any question of this.  Anterior Repair I have discussed the procedure of anterior repair and Kelly placation.  I have informed the patient that this procedure often corrects or improves stress urinary incontinence, but that there is certainly no guarantee of her improvement.  The procedure itself was discussed.  The possible damage to the bowel, ureters or urethra was also discussed.  We have reviewed the repositioning of the bladder that often takes place at anterior repair and I have informed her that although unlikely, it is possible that a worsening of her incontinence could occur after this procedure.  I have also discussed with her the necessity of decreased lifting and physical activity following the procedure as well as the possibility that as she gets older, her stress urinary incontinence could slowly return.  The use of vaginal Estrogen or oral Estrogen as well as other medications in the role of both stress and bladder dysynergia incontinence were discussed.  I have discussed the complication of inability to void immediately following the procedure.  The patient is aware that she may go home using a Foley catheter or may be taught the technique of self-catheterization should a complication develop.  All of her questions have been answered and I believe that she has an informed understanding of anterior repair/Kelly plication. Posterior Repair Posterior repair was discussed with the patient.  The risks were reviewed and include:  possible damage to rectum and bowel, bleeding, infection and anesthesia.  The benefits were also discussed.  Post-op recovery with special attention to hospital stay and return to sexual function  were specifically  reviewed.  All her questions were answered, and I believe that she has an informed understanding of Posterior repair.     Regarding the polypropylene mesh and mid-urethral slings: I have review the current position statements from AUGS 2014 and the AUA.  I have given her copies of both to review.  All of her questions were answered.  She has been advised that should she have any additional questions or concerns regarding her sling procedure we would be happy to schedule a time before her surgery to discuss them. All of the patient's questions have been answered and I believe she has an informed understanding of TOT and cystoscopy.   Finis Bud, M.D. 11/27/2016 10:31 AM

## 2016-12-04 NOTE — Anesthesia Postprocedure Evaluation (Signed)
Anesthesia Post Note  Patient: Melissa Cabrera  Procedure(s) Performed: Procedure(s) (LRB): HYSTERECTOMY TOTAL LAPAROSCOPIC WITH BILATERAL SALPINGO OOPHERECTOMY (Bilateral) ANTERIOR (CYSTOCELE) AND POSTERIOR REPAIR (RECTOCELE), TRANSOBTURATOR SLING PLACEMENT(ARIS) (N/A)  Patient location during evaluation: PACU Anesthesia Type: General Level of consciousness: awake and alert Pain management: pain level controlled Vital Signs Assessment: post-procedure vital signs reviewed and stable Respiratory status: spontaneous breathing, nonlabored ventilation, respiratory function stable and patient connected to nasal cannula oxygen Cardiovascular status: blood pressure returned to baseline and stable Postop Assessment: no signs of nausea or vomiting Anesthetic complications: no     Last Vitals:  Vitals:   12/04/16 1154 12/04/16 1236  BP: 112/60 (!) 110/49  Pulse: 82 83  Resp: 14 14  Temp:  (!) 35.6 C    Last Pain:  Vitals:   12/04/16 1236  TempSrc: Axillary  PainSc:                  Sanaz Scarlett S

## 2016-12-04 NOTE — Anesthesia Preprocedure Evaluation (Addendum)
Anesthesia Evaluation  Patient identified by MRN, date of birth, ID band Patient awake    Reviewed: Allergy & Precautions, NPO status , Patient's Chart, lab work & pertinent test results, reviewed documented beta blocker date and time   Airway Mallampati: III  TM Distance: >3 FB     Dental  (+) Chipped, Partial Lower, Partial Upper   Pulmonary former smoker,           Cardiovascular hypertension, + Valvular Problems/Murmurs      Neuro/Psych    GI/Hepatic GERD  Controlled,  Endo/Other  diabetes, Type 2Morbid obesity  Renal/GU      Musculoskeletal  (+) Arthritis ,   Abdominal   Peds  Hematology   Anesthesia Other Findings   Reproductive/Obstetrics                            Anesthesia Physical Anesthesia Plan  ASA: III  Anesthesia Plan: General   Post-op Pain Management:    Induction: Intravenous  PONV Risk Score and Plan:   Airway Management Planned: Oral ETT  Additional Equipment:   Intra-op Plan:   Post-operative Plan:   Informed Consent: I have reviewed the patients History and Physical, chart, labs and discussed the procedure including the risks, benefits and alternatives for the proposed anesthesia with the patient or authorized representative who has indicated his/her understanding and acceptance.     Plan Discussed with: CRNA  Anesthesia Plan Comments:         Anesthesia Quick Evaluation

## 2016-12-04 NOTE — Op Note (Addendum)
OPERATIVE NOTE 12/04/2016 10:41 AM  PRE-OPERATIVE DIAGNOSIS:  1) ENDOMETRIAL HYPERPLASIA,URINARY INCONTINENCE  POST-OPERATIVE DIAGNOSIS:  Same  OPERATION: Procedure(s) (LRB): HYSTERECTOMY TOTAL LAPAROSCOPIC WITH BILATERAL SALPINGO OOPHERECTOMY (Bilateral) ANTERIOR (CYSTOCELE) AND POSTERIOR REPAIR (RECTOCELE), TRANSOBTURATOR SLING PLACEMENT(ARIS) (N/A)     SURGEON(S): Surgeon(s) and Role:    Harlin Heys, MD - Primary  ASSISTANT:  Dr. Fransisca Connors   ANESTHESIA: General  ESTIMATED BLOOD LOSS: 342ml  OPERATIVE FINDINGS:   SPECIMEN:  ID Type Source Tests Collected by Time Destination  1 : uterus, cervix, bilateral tubes and ovaries Tissue [facility] Gyn tumor resection SURGICAL PATHOLOGY Harlin Heys, MD 0/11/[id number]     COMPLICATIONS: None  DRAINS: Foley to gravity  DISPOSITION: Stable to recovery room  DESCRIPTION OF PROCEDURE:      The patient was prepped and draped in the dorsolithotomy position and placed under general anesthesia. The bladder was emptied. Dr. Fransisca Connors injected ICG dye into the cervix in the usual manner.  The cervix was grasped with a multi-toothed tenaculum and a uterine manipulator was placed within the cervical os respecting the position and curvature of the uterus. After changing gloves we proceeded abdominally. A small supra-umbilical incision was made and a 10 mm Hassan trocar port was placed within the abdominopelvic cavity by Dr. Fransisca Connors. Approximately 3 and 1/2 L of carbon dioxide gas was instilled within the abdominal pelvic cavity. The laparoscope was placed and the pelvis and abdomen were carefully inspected. In the usual manner, under direct visualization right and left lower quadrant ports of 5 mm size were placed. Both ureters were identified in the pelvis prior to dissection or clamping and cutting of pedicles. The infundibulopelvic ligaments were carefully identified. The ureters were again identified out of the operative  field. The ligaments were triply coagulated and divided. Hemostasis of the pedicles was noted. The fallopian tubes were elevated and the mesenteric side systematically coagulated and divided allowing the tube to be removed at the time of uterine removal. The round ligaments were coagulated and divided and a bladder flap was created. The upper aspect of the broad ligament was clamped coagulated and divided. The uterine arteries were skeletonized, triply coagulated and divided. Careful inspection of all pedicles and the remainder of the pelvis was performed. Hemostasis was noted. We then proceeded vaginally. A weighted speculum was placed posteriorly. A multi-toothed tenaculum was used to grasp the cervix and the cervix was injected in a circumferential manner with a dilute Pitressin solution. An incision was made around the cervix and the vaginal mucosa was dissected off of the cervix. The posterior cul-de-sac was identified and entered and the weighted speculum was placed within this. The anterior cul-de-sac was identified and entered and a retractor was placed and used to retract the bladder anteriorly keeping it out of the operative field. The uterosacral ligaments were clamped divided and suture ligated. The cardinal ligaments were clamped divided and suture ligated. The small remaining pedicle was clamped divided and suture ligated bilaterally allowing delivery of the specimen. Angle sutures were placed in the manner. A culdoplasty was performed. The peritoneum was identified anteriorly and then incorporating the left upper pedicle left lower pedicle right lower pedicle right upper pedicle and anterior peritoneum a pursestring suture was placed exteriorizing all pedicles. Hemostasis of all pedicles was noted at this time.  The vaginal mucosa beginning at the vaginal cuff and overlying the bladder, was grasped with Allis clamps and injected with a dilute Pitressin solution in the midline. A  midline incision  was made to the level of the urethra. The vaginal mucosa was dissected laterally from the underlying attenuated fascia. During this repair, the frozen section came back with no evidence of invasion.  Lymph node dissection would not be necessary.   The obturator foramina were identified in the usual manner bilaterally and marked with a marking pen the skin and subcutaneous tissues were injected with a dilute Pitressin solution. Stab incisions were made and the TOT trochars were placed through these incisions onto the operator's finger in the vagina which was retracted and bladder medially. The vaginal tape was then placed on the trochars and reversed through these incisions. A Kelly clamp was placed under the tape and the sleeves of the tape were removed. The tape was noted to be correctly positioned underneath the urethra without twists. The excess tape was removed at the level of the skin. Steri-Strips were applied over these small skin incisions. A typical Kelly plication was performed carefully covering the tension-free vaginal tape with thickened fascia. The bladder was plicated several sutures of 3-0 Vicryl.  A shelf of fascia was then approximated in the midline placing the bladder back in its more anatomic position.The excess vaginal mucosa was trimmed. Vaginal mucosa was then closed in the midline with interrupted sutures to the level of the vaginal cuff. The vaginal cuff was closed with Vicryl suture. Hemostasis was noted. The posterior fourchette at approximately the hymenal ring was grasped using Allis clamps. The posterior vaginal mucosa was injected in the midline with a dilute Pitressin solution. A midline incision was made through the vaginal mucosa to the level of the vaginal cuff, and the vaginal mucosa was dissected laterally exposing the underlying attenuated fascia. The rectum was plicated using Vicryl suture. Beginning at the vaginal cuff the attenuated fascia was grasped laterally and  approximated in the midline thickening and tightening the fascia.. These sutures were carried down to the level of the perineum. The excess vaginal mucosa was trimmed. The vagina was closed with a running suture beginning at the vaginal cuff and near the perineal body. The perineal body was reinforced with multiple sutures of Vicryl. The mucosa was then closed over the perineal body in a subcuticular manner. We then changed gloves and proceeded back to the abdomen.  Hemostasis was noted abdominally. The lower quadrant ports were removed, hemostasis of the port sites was noted, and the incisions were closed in subcuticular manner. The laparoscope and trocar sleeve were removed from the umbilical incision, hemostasis was noted, and the incision was closed by first closing fascia and then skin in a subcuticular manner. A long-acting anesthetic was employed in the skin incisions. The patient went to recovery room in stable condition. Clear urine was noted in the Foley at the conclusion of the procedure.  Finis Bud, M.D. 12/04/2016 10:41 AM

## 2016-12-04 NOTE — Consult Note (Signed)
I met patient and reviewed plans for me to do SLN mapping and then biopsies of pelvic lymph nodes if frozen section shows uterine cancer. I will assist Dr Amalia Hailey with the remainder of the procedure. Mellody Drown, MD

## 2016-12-05 ENCOUNTER — Encounter: Payer: Self-pay | Admitting: Obstetrics and Gynecology

## 2016-12-05 DIAGNOSIS — N950 Postmenopausal bleeding: Secondary | ICD-10-CM

## 2016-12-05 DIAGNOSIS — N8502 Endometrial intraepithelial neoplasia [EIN]: Secondary | ICD-10-CM

## 2016-12-05 LAB — SURGICAL PATHOLOGY

## 2016-12-05 MED ORDER — GLIPIZIDE 5 MG PO TABS
5.0000 mg | ORAL_TABLET | Freq: Every day | ORAL | Status: DC
  Administered 2016-12-05 – 2016-12-06 (×2): 5 mg via ORAL
  Filled 2016-12-05 (×2): qty 1

## 2016-12-05 NOTE — Progress Notes (Signed)
Patient ID: Melissa Cabrera, female   DOB: [birth year], 61 y.o.   MRN: [id number]         POST-OP NOTE - DAY # 1    Subjective:   The patient does not have complaints.  She is ambulating well. She is taking PO well. Her pain is well controlled with her current medications. She is urinating without difficulty and is passing flatus.   Objective:  BP (!) 113/51 (BP Location: Left Arm)   Pulse 66   Temp 97.7 F (36.5 C) (Oral)   Resp 18   Ht 5\' 1"  (1.549 m)   Wt 238 lb (108 kg)   SpO2 97%   BMI 44.97 kg/m     Abdomen:                          Abdomen soft and nontender without distention, masses , no wound infection noted.      clean, dry, no drainage    Assessment:   Doing well.  Normal progress as expected.   Discussed frozen section and Cancer status.     Plan:        Advance diet Encourage ambulation Advance to PO medication Continue foley due to sling procedure  Discussed foley clamping today with voiding trial tomorrow.    Finis Bud, M.D. 12/05/2016 8:32 AM

## 2016-12-05 NOTE — Progress Notes (Signed)
MD at bedside to evaluate patient. MD notified on BP 113/51. Orders given to hold 10 AM blood pressure medications.   Hilbert Bible, RN

## 2016-12-06 MED ORDER — SENNOSIDES-DOCUSATE SODIUM 8.6-50 MG PO TABS
2.0000 | ORAL_TABLET | Freq: Once | ORAL | Status: AC
  Administered 2016-12-06: 2 via ORAL
  Filled 2016-12-06: qty 2

## 2016-12-06 NOTE — Progress Notes (Signed)
Inpatient Diabetes Program Recommendations  AACE/ADA: New Consensus Statement on Inpatient Glycemic Control (2015)  Target Ranges:  Prepandial:   less than 140 mg/dL      Peak postprandial:   less than 180 mg/dL (1-2 hours)      Critically ill patients:  140 - 180 mg/dL   Results for Melissa Cabrera, Melissa Cabrera (MRN [id number]) as of 12/06/2016 11:09  Ref. Range 12/04/2016 06:18 12/04/2016 11:01  Glucose-Capillary Latest Ref Range: 65 - 99 mg/dL 105 (H) 231 (H)    Admit with: Hysterectomy  History: DM  Home DM Meds: Glipizide 5 mg daily  Current Insulin Orders: Glipizide 5 mg daily     MD- Please consider the following in-hospital insulin adjustments:  1. Start Novolog Sensitive Correction Scale/ SSI (0-9 units) TID AC + HS  2. Change diet to Carbohydrate Modified diet      --Will follow patient during hospitalization--  Wyn Quaker RN, MSN, CDE Diabetes Coordinator Inpatient Glycemic Control Team Team Pager: [phone] (8a-5p)

## 2016-12-06 NOTE — Discharge Instructions (Signed)
Please call your doctor or return to the ER if you experience any chest pains, shortness of breath, dizziness, visual changes, fever greater than 101, any heavy bleeding (saturating more than 1 pad per hour), large clots, or foul smelling discharge, and/or any worsening abdominal pain and cramping that is not controlled by pain medication, . No tampons, enemas, douches, or sexual intercourse for 6 weeks. Also avoid tub baths, hot tubs, or swimming for 6 weeks.  . Check your incisions daily for any signs of infection such as redness, warmth, swelling, increased pain, and/or pus or foul smelling drainage  Activity: do not lift over 20 lbs for 6 weeks No driving for 10 days  Pelvic rest for 6 weeks

## 2016-12-06 NOTE — Progress Notes (Signed)
Discharge order received from doctor. Reviewed discharge instructions and prescriptions with patient and answered all questions. Follow up appointment given. Incision cleaning kit given. Patient verbalized understanding. Patient discharged home via wheelchair by nursing/auxillary.    Jonaven Hilgers Garner, RN  

## 2016-12-06 NOTE — Discharge Summary (Signed)
     Discharge Summary  Admit date: 12/04/2016  Discharge Date and Time:12/06/2016  2:28 PM  Discharge to:  Home  Admission Diagnosis: EIN  Discharge  Diagnoses: Same sp/ surgery OR Procedures:   Procedure(s): HYSTERECTOMY TOTAL LAPAROSCOPIC WITH BILATERAL SALPINGO OOPHERECTOMY ANTERIOR (CYSTOCELE) AND POSTERIOR REPAIR (RECTOCELE), TRANSOBTURATOR SLING PLACEMENT(ARIS) Date -------------------                              Discharge Day Progress Note:   Subjective:   The patient does not have complaints.  She is ambulating well. She is taking PO well. Her pain is well controlled with her current medications. She is urinating without difficulty and is passing flatus.   Objective:  BP 129/63 (BP Location: Left Arm)   Pulse 60   Temp 98.2 F (36.8 C) (Oral)   Resp 18   Ht 5\' 1"  (1.549 m)   Wt 238 lb (108 kg)   SpO2 98%   BMI 44.97 kg/m     Abdomen:                          clean, dry, no drainage    Assessment:   Doing well.  Normal progress as expected.   Voiding well with low residuals.  Plan:        Discharge home.                       Medications as directed.  Hospital Course:  Pt has den very well post-op with excellent pain control and voiding well.  Condition at Discharge:  good   Follow Up:    F/U 1 week as scheduled  Finis Bud, M.D. 12/06/2016 2:28 PM

## 2016-12-08 ENCOUNTER — Encounter: Payer: Self-pay | Admitting: Emergency Medicine

## 2016-12-08 ENCOUNTER — Emergency Department
Admission: EM | Admit: 2016-12-08 | Discharge: 2016-12-08 | Disposition: A | Discharge status: Home / Self Care | Payer: BLUE CROSS/BLUE SHIELD | Attending: Emergency Medicine | Admitting: Emergency Medicine

## 2016-12-08 ENCOUNTER — Emergency Department: Payer: BLUE CROSS/BLUE SHIELD

## 2016-12-08 DIAGNOSIS — Z87891 Personal history of nicotine dependence: Secondary | ICD-10-CM | POA: Diagnosis not present

## 2016-12-08 DIAGNOSIS — N390 Urinary tract infection, site not specified: Secondary | ICD-10-CM | POA: Diagnosis not present

## 2016-12-08 DIAGNOSIS — K5900 Constipation, unspecified: Secondary | ICD-10-CM | POA: Diagnosis not present

## 2016-12-08 DIAGNOSIS — E119 Type 2 diabetes mellitus without complications: Secondary | ICD-10-CM | POA: Diagnosis not present

## 2016-12-08 DIAGNOSIS — I10 Essential (primary) hypertension: Secondary | ICD-10-CM | POA: Insufficient documentation

## 2016-12-08 DIAGNOSIS — R109 Unspecified abdominal pain: Secondary | ICD-10-CM | POA: Diagnosis present

## 2016-12-08 DIAGNOSIS — Z7984 Long term (current) use of oral hypoglycemic drugs: Secondary | ICD-10-CM | POA: Diagnosis not present

## 2016-12-08 DIAGNOSIS — Z79899 Other long term (current) drug therapy: Secondary | ICD-10-CM | POA: Diagnosis not present

## 2016-12-08 LAB — COMPREHENSIVE METABOLIC PANEL
ALBUMIN: 4 g/dL (ref 3.5–5.0)
ALK PHOS: 56 U/L (ref 38–126)
ALT: 21 U/L (ref 14–54)
ANION GAP: 9 (ref 5–15)
AST: 21 U/L (ref 15–41)
BILIRUBIN TOTAL: 0.8 mg/dL (ref 0.3–1.2)
BUN: 15 mg/dL (ref 6–20)
CALCIUM: 9.3 mg/dL (ref 8.9–10.3)
CO2: 28 mmol/L (ref 22–32)
Chloride: 103 mmol/L (ref 101–111)
Creatinine, Ser: 0.86 mg/dL (ref 0.44–1.00)
GFR calc non Af Amer: >60 mL/min (ref >60)
GLUCOSE: 114 mg/dL — AB (ref 65–99)
POTASSIUM: 4.3 mmol/L (ref 3.5–5.1)
Sodium: 140 mmol/L (ref 135–145)
TOTAL PROTEIN: 7.3 g/dL (ref 6.5–8.1)

## 2016-12-08 LAB — URINALYSIS, COMPLETE (UACMP) WITH MICROSCOPIC
Bacteria, UA: NONE SEEN
Bilirubin Urine: NEGATIVE
GLUCOSE, UA: NEGATIVE mg/dL
KETONES UR: NEGATIVE mg/dL
NITRITE: NEGATIVE
Protein, ur: NEGATIVE mg/dL
SPECIFIC GRAVITY, URINE: 1.019 (ref 1.005–1.030)
pH: 5 (ref 5.0–8.0)

## 2016-12-08 LAB — CBC WITH DIFFERENTIAL/PLATELET
Basophils Absolute: 0 10*3/uL (ref 0–0.1)
Basophils Relative: 1 %
EOS ABS: 0.2 10*3/uL (ref 0–0.7)
EOS PCT: 2 %
HEMATOCRIT: 38.9 % (ref 35.0–47.0)
Hemoglobin: 13.1 g/dL (ref 12.0–16.0)
LYMPHS ABS: 2.6 10*3/uL (ref 1.0–3.6)
LYMPHS PCT: 28 %
MCH: 29.7 pg (ref 26.0–34.0)
MCHC: 33.6 g/dL (ref 32.0–36.0)
MCV: 88.3 fL (ref 80.0–100.0)
MONO ABS: 0.8 10*3/uL (ref 0.2–0.9)
Monocytes Relative: 8 %
Neutro Abs: 5.8 10*3/uL (ref 1.4–6.5)
Neutrophils Relative %: 61 %
Platelets: 342 10*3/uL (ref 150–440)
RBC: 4.41 MIL/uL (ref 3.80–5.20)
RDW: 14.9 % — AB (ref 11.5–14.5)
WBC: 9.3 10*3/uL (ref 3.6–11.0)

## 2016-12-08 MED ORDER — DOCUSATE SODIUM 100 MG PO CAPS: 100.0000 mg | ORAL_CAPSULE | Freq: Two times a day (BID) | ORAL | 0 refills | Status: DC

## 2016-12-08 MED ORDER — CEPHALEXIN 500 MG PO CAPS: 500.0000 mg | ORAL_CAPSULE | Freq: Two times a day (BID) | ORAL | 0 refills | Status: DC

## 2016-12-08 NOTE — ED Notes (Signed)
Pt had large bm with enema. States feels much better.

## 2016-12-08 NOTE — ED Triage Notes (Signed)
Pt to ED with c/o of constipation. Pt had hysterectomy surgery wed and has not had a BM since. Pt states she was released from the hospital w/o having a BM prior to DC.

## 2016-12-08 NOTE — ED Notes (Signed)
Pt stated that shes not able to urinate right now.

## 2016-12-08 NOTE — ED Provider Notes (Signed)
[facility] Emergency Department Provider Note  Time seen: 7:19 PM  I have reviewed the triage vital signs and the nursing notes.   HISTORY  Chief Complaint Abdominal Pain    HPI Melissa Cabrera is a 61 y.o. female with a past medical history of diabetes, gastric reflux, hypertension, hyperlipidemia, presents to the emergency department for abdominal discomfort. According to the patient she had a total hysterectomy performed 5 days ago. States since going home she has not had a bowel movement. States she normally has a bowel movement every day. She states for the past 2 days she's been having discomfort in her lower abdomen and rectum states she feels constipated but was told not to strain to have a bowel movement or to urinate. States she is very uncomfortable so she came to the emergency department for evaluation. Denies any upper abdominal pain, nausea, vomiting, diarrhea. Denies dysuria. But does state some difficulty urinating. Denies fever. Describes her abdominal/rectal pain as mild to moderate dull aching type pain.  Past Medical History:  Diagnosis Date  . Allergy   . Arthritis   . Chicken pox   . Diabetes mellitus without complication ([place])   . GERD (gastroesophageal reflux disease)   . Heart murmur    DUE TO RHEUMATIC FEVER PER PT  . Hyperlipidemia   . Hypertension   . Psoriasis   . Rheumatic fever   . Rosacea     Patient Active Problem List   Diagnosis Date Noted  . Endometrial cancer ([place]) 12/04/2016  . Postmenopausal bleeding 10/29/2016  . Type 2 diabetes mellitus without complication ([place]) 52/84/1324  . Arthritis 04/12/2014  . Gastroesophageal reflux disease without esophagitis 04/12/2014  . Seasonal allergies 04/12/2014  . Essential hypertension 04/12/2014  . HLD (hyperlipidemia) 04/12/2014  . Psoriasis 04/12/2014  . Severe obesity (BMI >= 40) ([place]) 04/12/2014    Past Surgical History:  Procedure Laterality Date  . ANTERIOR AND  POSTERIOR REPAIR N/A 12/04/2016   Procedure: ANTERIOR (CYSTOCELE) AND POSTERIOR REPAIR (RECTOCELE), TRANSOBTURATOR SLING PLACEMENT(ARIS);  Surgeon: Harlin Heys, MD;  Location: [facility] ORS;  Service: Gynecology;  Laterality: N/A;  . DILATION AND CURETTAGE OF UTERUS N/A 09/16/2016   Procedure: DILATATION AND CURETTAGE;  Surgeon: Harlin Heys, MD;  Location: [facility] ORS;  Service: Gynecology;  Laterality: N/A;  . LAPAROSCOPIC HYSTERECTOMY Bilateral 12/04/2016   Procedure: HYSTERECTOMY TOTAL LAPAROSCOPIC WITH BILATERAL SALPINGO OOPHERECTOMY;  Surgeon: Harlin Heys, MD;  Location: [facility] ORS;  Service: Gynecology;  Laterality: Bilateral;  . MOUTH SURGERY      Prior to Admission medications   Medication Sig Start Date End Date Taking? Authorizing Provider  betamethasone dipropionate (DIPROLENE) 0.05 % ointment Apply 1 application topically 2 (two) times daily as needed. psoriasis     [provider]  calcium carbonate (TUMS EX) 750 MG chewable tablet Chew 750 mg by mouth daily.    [provider]  cetirizine (ZYRTEC) 10 MG tablet Take 10 mg by mouth at bedtime.     [provider]  Cholecalciferol (VITAMIN D3) 400 UNITS CAPS Take 400 Units by mouth daily.     [provider]  clotrimazole-betamethasone (LOTRISONE) cream APPLY TOPICALLY AS DIRECTED TWICE DAILY Patient taking differently: Apply 1 application topically 2 (two) times daily as needed (BACTERIAL INFECTION). APPLY TOPICALLY AS DIRECTED TWICE DAILY 10/29/16   [name], NP  diphenhydrAMINE (BENADRYL) 25 mg capsule Take 50 mg by mouth at bedtime.     [provider]  fluocinonide ointment (LIDEX) 0.05 %  Apply 1 application topically 2 (two) times daily as needed (PSORIASIS BEHIND EARS).     [provider]  fluticasone (FLONASE) 50 MCG/ACT nasal spray Place 1 spray into both nostrils at bedtime.  07/19/15   [provider]  folic acid (FOLVITE) 1 MG tablet Take 1 mg by  mouth daily.    [provider]  glipiZIDE (GLUCOTROL) 5 MG tablet Take 1 tablet (5 mg total) by mouth daily before breakfast. 10/30/16   [name], NP  ibuprofen (ADVIL,MOTRIN) 200 MG tablet Take 800 mg by mouth at bedtime as needed for moderate pain.     [provider]  ketoconazole (NIZORAL) 2 % shampoo Apply 1 application topically 2 (two) times a week.    [provider]  lisinopril (PRINIVIL,ZESTRIL) 10 MG tablet Take 1 tablet (10 mg total) by mouth daily. Patient taking differently: Take 10 mg by mouth every evening. 5:00 pm 10/30/16   [name], NP  methotrexate (RHEUMATREX) 2.5 MG tablet Take 15 mg by mouth every Friday. Caution:Chemotherapy. Protect from light.     [provider]  Omega-3 Fatty Acids (FISH OIL PO) Take 1 capsule by mouth 2 (two) times daily. 1040 mg of fish oil in each capsule    [provider]  omeprazole (PRILOSEC) 40 MG capsule Take 1 capsule (40 mg total) by mouth daily. Patient taking differently: Take 40 mg by mouth at bedtime.  11/15/16   [name], NP    No Known Allergies  Family History  Problem Relation Age of Onset  . Cancer Mother        skin  . Dementia Mother   . Cancer Sister        skin and liver  . Stroke Maternal Grandmother   . Skin cancer Brother   . Diabetes Neg Hx   . Heart disease Neg Hx     Social History Social History  Substance Use Topics  . Smoking status: Former Smoker    Packs/day: 4.00    Years: 16.00    Types: Cigarettes    Quit date: 09/12/1995  . Smokeless tobacco: Never Used     Comment: quit in 1997  . Alcohol use No    Review of Systems Constitutional: Negative for fever. Cardiovascular: Negative for chest pain. Respiratory: Negative for shortness of breath. Gastrointestinal: Positive for lower abdominal discomfort. Negative for nausea or vomiting. Positive for constipation 5 days. Genitourinary: Negative for dysuria Neurological: Negative for  headache All other ROS negative  ____________________________________________   PHYSICAL EXAM:  VITAL SIGNS: ED Triage Vitals  Enc Vitals Group     BP 12/08/16 1607 (!) 125/54     Pulse Rate 12/08/16 1607 85     Resp 12/08/16 1607 (!) 22     Temp 12/08/16 1607 98.1 F (36.7 C)     Temp Source 12/08/16 1607 Oral     SpO2 12/08/16 1607 99 %     Weight 12/08/16 1607 238 lb (108 kg)     Height 12/08/16 1607 5\' 1"  (1.549 m)     Head Circumference --      Peak Flow --      Pain Score 12/08/16 1606 7     Pain Loc --      Pain Edu? --      Excl. in [place]? --     Constitutional: Alert and oriented. Well appearing and in no distress. Eyes: Normal exam ENT   Head: Normocephalic and atraumatic  Mouth/Throat: Mucous membranes are moist. Cardiovascular: Normal rate, regular rhythm. No murmur Respiratory: Normal respiratory effort without tachypnea nor retractions. Breath sounds are clear  Gastrointestinal: Soft and nontender. No distention. Musculoskeletal: Nontender with normal range of motion in all extremities.  Neurologic:  Normal speech and language. No gross focal neurologic deficits  Skin:  Skin is warm, dry and intact.  Psychiatric: Mood and affect are normal.   ____________________________________________   RADIOLOGY  IMPRESSION: Constipation. No bowel obstruction.  ____________________________________________   INITIAL IMPRESSION / ASSESSMENT AND PLAN / ED COURSE  Pertinent labs & imaging results that were available during my care of the patient were reviewed by me and considered in my medical decision making (see chart for details).  Patient presents to emergency department for 5 days of constipation and some lower abdominal discomfort. Patient has a completely nontender abdomen, largely negative review of systems. Patient's labs show what appears to be a urinary tract infection with too numerous to count white blood cells. We will send a urine culture and  cover with antibiotics. Given the patient's constipation and discomfort we will obtain a two-view abdominal x-ray and attempt an enema for symptom relief. Patient agreeable to this plan.  Patient's urinalysis does show too numerous to count white blood cells we'll cover with antibiotics. Urine culture has been sent. Patient had a very large bowel movement after enema with significant symptom relief. X-rays read as negative. We'll discharge the patient home with Colace and primary care follow-up.   ____________________________________________   FINAL CLINICAL IMPRESSION(S) / ED DIAGNOSES  Urinary tract infection Constipation    Harvest Dark, MD 12/08/16 2024

## 2016-12-09 ENCOUNTER — Encounter: Payer: Self-pay | Admitting: Internal Medicine

## 2016-12-11 LAB — URINE CULTURE: Culture: >=100000 — AB

## 2016-12-12 NOTE — Progress Notes (Signed)
ED Antimicrobial Stewardship Positive Culture Follow Up   Melissa Cabrera is an 61 y.o. female who presented to [facility] on 12/08/2016 with a chief complaint of  Chief Complaint  Patient presents with  . Abdominal Pain    Recent Results (from the past 720 hour(s))  Urine Culture     Status: Abnormal   Collection Time: 12/08/16  6:45 PM  Result Value Ref Range Status   Specimen Description URINE, RANDOM  Final   Special Requests NONE  Final   Culture >=100,000 COLONIES/mL ENTEROCOCCUS FAECALIS (A)  Final   Report Status 12/11/2016 FINAL  Final   Organism ID, Bacteria ENTEROCOCCUS FAECALIS (A)  Final      Susceptibility   Enterococcus faecalis - MIC*    AMPICILLIN <=2 SENSITIVE Sensitive     LEVOFLOXACIN 2 SENSITIVE Sensitive     NITROFURANTOIN 32 SENSITIVE Sensitive     VANCOMYCIN 2 SENSITIVE Sensitive     * >=100,000 COLONIES/mL ENTEROCOCCUS FAECALIS    [x]  Treated with cephalexin, organism(Enterococcus) is not covered by prescribed antimicrobial  New antibiotic prescription: Amoxicillin 875 mg po q12h x 5 days called in to [facility] on [address] [phone].  Spoke with Patient at [phone] to inform about antibiotic change and for preferred pharmacy.   ED Provider: Lorenda Ishihara A PharmD 12/12/2016, 5:10 PM

## 2016-12-13 ENCOUNTER — Ambulatory Visit (INDEPENDENT_AMBULATORY_CARE_PROVIDER_SITE_OTHER): Payer: BLUE CROSS/BLUE SHIELD | Admitting: Obstetrics and Gynecology

## 2016-12-13 ENCOUNTER — Encounter: Payer: Self-pay | Admitting: Obstetrics and Gynecology

## 2016-12-13 VITALS — BP 125/73 | HR 79 | Ht 61.0 in | Wt 234.5 lb

## 2016-12-13 DIAGNOSIS — B952 Enterococcus as the cause of diseases classified elsewhere: Secondary | ICD-10-CM

## 2016-12-13 DIAGNOSIS — N390 Urinary tract infection, site not specified: Secondary | ICD-10-CM

## 2016-12-13 DIAGNOSIS — N8502 Endometrial intraepithelial neoplasia [EIN]: Secondary | ICD-10-CM

## 2016-12-13 DIAGNOSIS — Z9889 Other specified postprocedural states: Secondary | ICD-10-CM | POA: Diagnosis not present

## 2016-12-13 MED ORDER — NITROFURANTOIN MONOHYD MACRO 100 MG PO CAPS: 100.0000 mg | ORAL_CAPSULE | Freq: Two times a day (BID) | ORAL | 1 refills | Status: DC

## 2016-12-13 NOTE — Progress Notes (Signed)
HPI:      Melissa Cabrera is a 61 y.o. G3P3 who LMP was No LMP recorded. Patient has had a hysterectomy.  Subjective:   She presents today for 1 week postop visit. She was seen in the emergency department with complaint of constipation. She was diagnosed with a urinary tract infection. She was given antibiotics but these have turned out to be not affected against enterococcus. She feels well at this time. She denies bleeding. She is now having bowel movements.    Hx: The following portions of the patient's history were reviewed and updated as appropriate:             She  has a past medical history of Allergy; Arthritis; Chicken pox; Diabetes mellitus without complication ([place]); GERD (gastroesophageal reflux disease); Heart murmur; Hyperlipidemia; Hypertension; Psoriasis; Rheumatic fever; and Rosacea. She  does not have any pertinent problems on file. She  has a past surgical history that includes Mouth surgery; Dilation and curettage of uterus (N/A, 09/16/2016); Laparoscopic hysterectomy (Bilateral, 12/04/2016); Anterior and posterior repair (N/A, 12/04/2016); and Abdominal hysterectomy. Her family history includes Cancer in her mother and sister; Dementia in her mother; Skin cancer in her brother; Stroke in her maternal grandmother. She  reports that she quit smoking about 21 years ago. Her smoking use included Cigarettes. She has a 64.00 pack-year smoking history. She has never used smokeless tobacco. She reports that she does not drink alcohol or use drugs. She has No Known Allergies.       Review of Systems:  Review of Systems  Constitutional: Denied constitutional symptoms, night sweats, recent illness, fatigue, fever, insomnia and weight loss.  Eyes: Denied eye symptoms, eye pain, photophobia, vision change and visual disturbance.  Ears/Nose/Throat/Neck: Denied ear, nose, throat or neck symptoms, hearing loss, nasal discharge, sinus congestion and sore throat.  Cardiovascular: Denied  cardiovascular symptoms, arrhythmia, chest pain/pressure, edema, exercise intolerance, orthopnea and palpitations.  Respiratory: Denied pulmonary symptoms, asthma, pleuritic pain, productive sputum, cough, dyspnea and wheezing.  Gastrointestinal: Denied, gastro-esophageal reflux, melena, nausea and vomiting.  Genitourinary: Denied genitourinary symptoms including symptomatic vaginal discharge, pelvic relaxation issues, and urinary complaints.  Musculoskeletal: Denied musculoskeletal symptoms, stiffness, swelling, muscle weakness and myalgia.  Dermatologic: Denied dermatology symptoms, rash and scar.  Neurologic: Denied neurology symptoms, dizziness, headache, neck pain and syncope.  Psychiatric: Denied psychiatric symptoms, anxiety and depression.  Endocrine: Denied endocrine symptoms including hot flashes and night sweats.   Meds:   Current Outpatient Prescriptions on File Prior to Visit  Medication Sig Dispense Refill  . betamethasone dipropionate (DIPROLENE) 0.05 % ointment Apply 1 application topically 2 (two) times daily as needed. psoriasis     . calcium carbonate (TUMS EX) 750 MG chewable tablet Chew 750 mg by mouth daily.    . cetirizine (ZYRTEC) 10 MG tablet Take 10 mg by mouth at bedtime.     . Cholecalciferol (VITAMIN D3) 400 UNITS CAPS Take 400 Units by mouth daily.     . clotrimazole-betamethasone (LOTRISONE) cream APPLY TOPICALLY AS DIRECTED TWICE DAILY (Patient taking differently: Apply 1 application topically 2 (two) times daily as needed (BACTERIAL INFECTION). APPLY TOPICALLY AS DIRECTED TWICE DAILY) 60 g 5  . diphenhydrAMINE (BENADRYL) 25 mg capsule Take 50 mg by mouth at bedtime.     . fluocinonide ointment (LIDEX) 8.65 % Apply 1 application topically 2 (two) times daily as needed (PSORIASIS BEHIND EARS).     . fluticasone (FLONASE) 50 MCG/ACT nasal spray Place 1 spray into both nostrils at bedtime.     Marland Kitchen  folic acid (FOLVITE) 1 MG tablet Take 1 mg by mouth daily.    Marland Kitchen  glipiZIDE (GLUCOTROL) 5 MG tablet Take 1 tablet (5 mg total) by mouth daily before breakfast. 30 tablet 2  . ibuprofen (ADVIL,MOTRIN) 200 MG tablet Take 800 mg by mouth at bedtime as needed for moderate pain.     Marland Kitchen ketoconazole (NIZORAL) 2 % shampoo Apply 1 application topically 2 (two) times a week.    Marland Kitchen lisinopril (PRINIVIL,ZESTRIL) 10 MG tablet Take 1 tablet (10 mg total) by mouth daily. (Patient taking differently: Take 10 mg by mouth every evening. 5:00 pm) 30 tablet 2  . Omega-3 Fatty Acids (FISH OIL PO) Take 1 capsule by mouth 2 (two) times daily. 1040 mg of fish oil in each capsule    . omeprazole (PRILOSEC) 40 MG capsule Take 1 capsule (40 mg total) by mouth daily. (Patient taking differently: Take 40 mg by mouth at bedtime. ) 90 capsule 2  . cephALEXin (KEFLEX) 500 MG capsule Take 1 capsule (500 mg total) by mouth 2 (two) times daily. (Patient not taking: Reported on 12/13/2016) 20 capsule 0  . docusate sodium (COLACE) 100 MG capsule Take 1 capsule (100 mg total) by mouth 2 (two) times daily. (Patient not taking: Reported on 12/13/2016) 60 capsule 0  . methotrexate (RHEUMATREX) 2.5 MG tablet Take 15 mg by mouth every Friday. Caution:Chemotherapy. Protect from light.      No current facility-administered medications on file prior to visit.     Objective:     Vitals:   12/13/16 1002  BP: 125/73  Pulse: 79               Abdomen: Soft.  Non-tender.  No masses.  No HSM.  Incision/s: Intact.  Healing well.  No erythema.  No drainage.   Steri-Strips removed   Assessment:    G3P3 Patient Active Problem List   Diagnosis Date Noted  . Endometrial cancer ([place]) 12/04/2016  . Postmenopausal bleeding 10/29/2016  . Type 2 diabetes mellitus without complication ([place]) 86/57/8469  . Arthritis 04/12/2014  . Gastroesophageal reflux disease without esophagitis 04/12/2014  . Seasonal allergies 04/12/2014  . Essential hypertension 04/12/2014  . HLD (hyperlipidemia) 04/12/2014  . Psoriasis  04/12/2014  . Severe obesity (BMI >= 40) ([place]) 04/12/2014     1. Endometrial intraepithelial neoplasia (EIN)   2. Post-operative state   3. UTI (urinary tract infection) due to Enterococcus     Patient doing well postoperatively   Plan:            1.  We will change antibiotics to Macrobid  2.  Patient may resume normal activities with exception of heavy lifting. Orders No orders of the defined types were placed in this encounter.    Meds ordered this encounter  Medications  . amoxicillin (AMOXIL) 875 MG tablet    Sig: Take 875 mg by mouth 2 (two) times daily.  . nitrofurantoin, macrocrystal-monohydrate, (MACROBID) 100 MG capsule    Sig: Take 1 capsule (100 mg total) by mouth 2 (two) times daily.    Dispense:  14 capsule    Refill:  1        F/U  Return in about 4 weeks (around 01/10/2017).  Finis Bud, M.D. 12/13/2016 11:29 AM

## 2016-12-20 ENCOUNTER — Telehealth: Payer: Self-pay | Admitting: Obstetrics and Gynecology

## 2016-12-20 NOTE — Telephone Encounter (Signed)
Pt sent mychart message regarding antibiotic for UTI. States she finished the script but she still has a strong odor to the urine and it is "green" in color. Advised to get script refilled and she asked about restarting methotrexate for psoriasis and osteoarthritis per Dr Marcelline Mates ok to restart.

## 2016-12-20 NOTE — Telephone Encounter (Signed)
The patient called in regards to her recent medication that Dr. Amalia Hailey prescribed. The patient stated that she has a few questions, She finished the prescription and she wants to know if she needs to continue taking the medication or if she needs a refill. The patient did not disclose any other information other than wanting to speak with someone. Please advise.

## 2016-12-30 ENCOUNTER — Encounter: Payer: Self-pay | Admitting: Internal Medicine

## 2017-01-03 ENCOUNTER — Telehealth: Payer: Self-pay | Admitting: Obstetrics and Gynecology

## 2017-01-03 ENCOUNTER — Encounter: Payer: Self-pay | Admitting: Obstetrics and Gynecology

## 2017-01-03 NOTE — Telephone Encounter (Signed)
The patient Melissa Cabrera for Dr. Amalia Hailey Nurse Ivin Booty to give her a call. The patient did not disclose any other information. Please advise.

## 2017-01-10 ENCOUNTER — Ambulatory Visit (INDEPENDENT_AMBULATORY_CARE_PROVIDER_SITE_OTHER): Payer: BLUE CROSS/BLUE SHIELD | Admitting: Obstetrics and Gynecology

## 2017-01-10 ENCOUNTER — Encounter: Payer: Self-pay | Admitting: Obstetrics and Gynecology

## 2017-01-10 VITALS — BP 149/80 | HR 78 | Ht 61.0 in | Wt 234.6 lb

## 2017-01-10 DIAGNOSIS — Z9889 Other specified postprocedural states: Secondary | ICD-10-CM

## 2017-01-10 DIAGNOSIS — R6882 Decreased libido: Secondary | ICD-10-CM

## 2017-01-10 DIAGNOSIS — N952 Postmenopausal atrophic vaginitis: Secondary | ICD-10-CM

## 2017-01-10 LAB — POCT URINALYSIS DIPSTICK
Bilirubin, UA: NEGATIVE
Blood, UA: NEGATIVE
GLUCOSE UA: NEGATIVE
KETONES UA: NEGATIVE
Leukocytes, UA: NEGATIVE
Nitrite, UA: NEGATIVE
Protein, UA: NEGATIVE
SPEC GRAV UA: 1.02 (ref 1.010–1.025)
Urobilinogen, UA: 0.2 E.U./dL
pH, UA: 5 (ref 5.0–8.0)

## 2017-01-10 MED ORDER — PRASTERONE 6.5 MG VA INST: 1.0000 | VAGINAL_INSERT | Freq: Every day | VAGINAL | 3 refills | Status: DC

## 2017-01-10 NOTE — Progress Notes (Signed)
HPI:      Melissa Cabrera is a 61 y.o. G3P3 who LMP was No LMP recorded. Patient has had a hysterectomy.  Subjective:   She presents today Approximately 6 weeks postop. She reports that her urine leakage is very much improved. She notes an occasional very small amount on her pad but otherwise she is not leaking. She complains of a decreased libido but would like to return to intercourse. (She says this is mostly at her husband's request.) She is requesting a treatment for her libido. She reports no vaginal bleeding.    Hx: The following portions of the patient's history were reviewed and updated as appropriate:             She  has a past medical history of Allergy; Arthritis; Chicken pox; Diabetes mellitus without complication ([place]); GERD (gastroesophageal reflux disease); Heart murmur; Hyperlipidemia; Hypertension; Psoriasis; Rheumatic fever; and Rosacea. She  does not have any pertinent problems on file. She  has a past surgical history that includes Mouth surgery; Dilation and curettage of uterus (N/A, 09/16/2016); Laparoscopic hysterectomy (Bilateral, 12/04/2016); Anterior and posterior repair (N/A, 12/04/2016); and Abdominal hysterectomy. Her family history includes Cancer in her mother and sister; Dementia in her mother; Skin cancer in her brother; Stroke in her maternal grandmother. She  reports that she quit smoking about 21 years ago. Her smoking use included Cigarettes. She has a 64.00 pack-year smoking history. She has never used smokeless tobacco. She reports that she does not drink alcohol or use drugs. She has No Known Allergies.       Review of Systems:  Review of Systems  Constitutional: Denied constitutional symptoms, night sweats, recent illness, fatigue, fever, insomnia and weight loss.  Eyes: Denied eye symptoms, eye pain, photophobia, vision change and visual disturbance.  Ears/Nose/Throat/Neck: Denied ear, nose, throat or neck symptoms, hearing loss, nasal discharge, sinus  congestion and sore throat.  Cardiovascular: Denied cardiovascular symptoms, arrhythmia, chest pain/pressure, edema, exercise intolerance, orthopnea and palpitations.  Respiratory: Denied pulmonary symptoms, asthma, pleuritic pain, productive sputum, cough, dyspnea and wheezing.  Gastrointestinal: Denied, gastro-esophageal reflux, melena, nausea and vomiting.  Genitourinary: Denied genitourinary symptoms including symptomatic vaginal discharge, pelvic relaxation issues, and urinary complaints.  Musculoskeletal: Denied musculoskeletal symptoms, stiffness, swelling, muscle weakness and myalgia.  Dermatologic: Denied dermatology symptoms, rash and scar.  Neurologic: Denied neurology symptoms, dizziness, headache, neck pain and syncope.  Psychiatric: Denied psychiatric symptoms, anxiety and depression.  Endocrine: Denied endocrine symptoms including hot flashes and night sweats.   Meds:   Current Outpatient Prescriptions on File Prior to Visit  Medication Sig Dispense Refill  . betamethasone dipropionate (DIPROLENE) 0.05 % ointment Apply 1 application topically 2 (two) times daily as needed. psoriasis     . calcium carbonate (TUMS EX) 750 MG chewable tablet Chew 750 mg by mouth daily.    . cetirizine (ZYRTEC) 10 MG tablet Take 10 mg by mouth at bedtime.     . Cholecalciferol (VITAMIN D3) 400 UNITS CAPS Take 400 Units by mouth daily.     . clotrimazole-betamethasone (LOTRISONE) cream APPLY TOPICALLY AS DIRECTED TWICE DAILY (Patient taking differently: Apply 1 application topically 2 (two) times daily as needed (BACTERIAL INFECTION). APPLY TOPICALLY AS DIRECTED TWICE DAILY) 60 g 5  . diphenhydrAMINE (BENADRYL) 25 mg capsule Take 50 mg by mouth at bedtime.     . docusate sodium (COLACE) 100 MG capsule Take 1 capsule (100 mg total) by mouth 2 (two) times daily. 60 capsule 0  . fluocinonide ointment (LIDEX) 0.05 %  Apply 1 application topically 2 (two) times daily as needed (PSORIASIS BEHIND EARS).      . fluticasone (FLONASE) 50 MCG/ACT nasal spray Place 1 spray into both nostrils at bedtime.     . folic acid (FOLVITE) 1 MG tablet Take 1 mg by mouth daily.    Marland Kitchen glipiZIDE (GLUCOTROL) 5 MG tablet Take 1 tablet (5 mg total) by mouth daily before breakfast. 30 tablet 2  . ibuprofen (ADVIL,MOTRIN) 200 MG tablet Take 800 mg by mouth at bedtime as needed for moderate pain.     Marland Kitchen ketoconazole (NIZORAL) 2 % shampoo Apply 1 application topically 2 (two) times a week.    Marland Kitchen lisinopril (PRINIVIL,ZESTRIL) 10 MG tablet Take 1 tablet (10 mg total) by mouth daily. (Patient taking differently: Take 10 mg by mouth every evening. 5:00 pm) 30 tablet 2  . methotrexate (RHEUMATREX) 2.5 MG tablet Take 15 mg by mouth every Friday. Caution:Chemotherapy. Protect from light.     . Omega-3 Fatty Acids (FISH OIL PO) Take 1 capsule by mouth 2 (two) times daily. 1040 mg of fish oil in each capsule    . omeprazole (PRILOSEC) 40 MG capsule Take 1 capsule (40 mg total) by mouth daily. (Patient taking differently: Take 40 mg by mouth at bedtime. ) 90 capsule 2   No current facility-administered medications on file prior to visit.     Objective:     Vitals:   01/10/17 0828  BP: (!) 149/80  Pulse: 78     Abdomen: Soft.  Non-tender.  No masses.  No HSM.  Incision/s: Intact.  Healing well.  No erythema.  No drainage.    Pelvic:   Vulva: Normal appearance.  No lesions.  Vagina: No lesions or abnormalities noted. Incisions healing well.  Mesh covered well.  Some suture still noted in the cuff and anterior repair. Small amount of granulation tissue noted .  Support: Normal pelvic support.  Urethra No masses tenderness or scarring.  Meatus Normal size without lesions or prolapse.  Vag Cuff: Intact.  No lesions.  Anus: Normal exam.  No lesions.  Perineum: Normal exam.  No lesions.        Bimanual   Adnexae: No masses.  Non-tender to palpation.  Cuff: Negative for abnormality.     Assessment:    G3P3 Patient Active  Problem List   Diagnosis Date Noted  . Endometrial cancer ([place]) 12/04/2016  . Postmenopausal bleeding 10/29/2016  . Type 2 diabetes mellitus without complication ([place]) 87/86/7672  . Arthritis 04/12/2014  . Gastroesophageal reflux disease without esophagitis 04/12/2014  . Seasonal allergies 04/12/2014  . Essential hypertension 04/12/2014  . HLD (hyperlipidemia) 04/12/2014  . Psoriasis 04/12/2014  . Severe obesity (BMI >= 40) ([place]) 04/12/2014     1. Post-operative state   2. Vaginal atrophy   3. Decreased libido     Recommend at least 2 more weeks of healing before intercourse.  But incisions are healing well.   Plan:            1.  Begin intra-Rosa for increasing vaginal tissue health and increasing libido.  2.  2 weeks before intercourse. Recommend lubrication.  3.  No evidence of UTI-patient somewhat dehydrated we have discussed this in detail. Orders Orders Placed This Encounter  Procedures  . POCT urinalysis dipstick    Meds ordered this encounter  Medications  . Prasterone (INTRAROSA) 6.5 MG INST    Sig: Place 1 capsule vaginally at bedtime.    Dispense:  28 each  Refill:  3          F/U  Return in about 3 months (around 04/11/2017).  Finis Bud, M.D. 01/10/2017 9:40 AM

## 2017-01-25 ENCOUNTER — Other Ambulatory Visit: Payer: Self-pay | Admitting: Internal Medicine

## 2017-01-25 DIAGNOSIS — E119 Type 2 diabetes mellitus without complications: Secondary | ICD-10-CM

## 2017-01-25 DIAGNOSIS — E7800 Pure hypercholesterolemia, unspecified: Secondary | ICD-10-CM

## 2017-02-04 ENCOUNTER — Ambulatory Visit (INDEPENDENT_AMBULATORY_CARE_PROVIDER_SITE_OTHER): Payer: 59 | Admitting: Internal Medicine

## 2017-02-04 ENCOUNTER — Encounter: Payer: Self-pay | Admitting: Internal Medicine

## 2017-02-04 VITALS — BP 124/70 | HR 69 | Temp 97.6°F | Wt 234.0 lb

## 2017-02-04 DIAGNOSIS — I10 Essential (primary) hypertension: Secondary | ICD-10-CM | POA: Diagnosis not present

## 2017-02-04 DIAGNOSIS — E119 Type 2 diabetes mellitus without complications: Secondary | ICD-10-CM

## 2017-02-04 DIAGNOSIS — L4050 Arthropathic psoriasis, unspecified: Secondary | ICD-10-CM | POA: Diagnosis not present

## 2017-02-04 DIAGNOSIS — E7800 Pure hypercholesterolemia, unspecified: Secondary | ICD-10-CM | POA: Diagnosis not present

## 2017-02-04 DIAGNOSIS — M7551 Bursitis of right shoulder: Secondary | ICD-10-CM | POA: Diagnosis not present

## 2017-02-04 DIAGNOSIS — M25511 Pain in right shoulder: Secondary | ICD-10-CM | POA: Diagnosis not present

## 2017-02-04 LAB — COMPREHENSIVE METABOLIC PANEL
ALBUMIN: 4 g/dL (ref 3.5–5.2)
ALK PHOS: 81 U/L (ref 39–117)
ALT: 13 U/L (ref 0–35)
AST: 14 U/L (ref 0–37)
BILIRUBIN TOTAL: 0.3 mg/dL (ref 0.2–1.2)
BUN: 18 mg/dL (ref 6–23)
CALCIUM: 9.6 mg/dL (ref 8.4–10.5)
CO2: 33 mEq/L — ABNORMAL HIGH (ref 19–32)
CREATININE: 0.87 mg/dL (ref 0.40–1.20)
Chloride: 103 mEq/L (ref 96–112)
GFR: 70.39 mL/min (ref >60.00)
Glucose, Bld: 101 mg/dL — ABNORMAL HIGH (ref 70–99)
Potassium: 4.4 mEq/L (ref 3.5–5.1)
SODIUM: 141 meq/L (ref 135–145)
TOTAL PROTEIN: 7 g/dL (ref 6.0–8.3)

## 2017-02-04 LAB — HEMOGLOBIN A1C: HEMOGLOBIN A1C: 6.1 % (ref 4.6–6.5)

## 2017-02-04 LAB — LIPID PANEL
CHOLESTEROL: 181 mg/dL (ref 0–200)
HDL: 60.5 mg/dL (ref >39.00)
LDL Cholesterol: 97 mg/dL (ref 0–99)
NonHDL: 120.07
Total CHOL/HDL Ratio: 3
Triglycerides: 116 mg/dL (ref 0.0–149.0)
VLDL: 23.2 mg/dL (ref 0.0–40.0)

## 2017-02-04 MED ORDER — NAPROXEN 500 MG PO TABS: 500.0000 mg | ORAL_TABLET | Freq: Two times a day (BID) | ORAL | 0 refills | Status: DC

## 2017-02-04 NOTE — Progress Notes (Signed)
Subjective:    Patient ID: Melissa Cabrera, female    DOB: 01/18/1956, 61 y.o.   MRN: [id number]  HPI  Pt presents to the clinic today to follow up HTN, HLD, and DM 2.  HTN: Her BP today is 124/70. She is taking Amlodipine and Lisinopril daily as prescribed. ECG from 09/2016 reviewed.  HLD: Her last LDL was 148, 10/2016. She was started on Simvastatin  And reports she is taking it daily as prescribed. She denies myalgias. She has been better about trying to consume a low fat diet.   DM 2: Her last A1C was 7.5%, 10/2016. She was started on Glipizide 5 mg once daily at that time. Her sugars range 50-148. She has been checking her feet daily. Her last eye exam was more than 1 year ago. She doesn't take flu or pneumonia vaccines.   She also c/o right shoulder pain. This started a few months ago. She describes the pain as sharp and stabbing. The pain seems worse when laying on her right side and reaching backwards. She denies known numbness, tingling or injury to the area. She has not taken anything OTC for her symptoms. She reports she mentioned it to her rheumatologist, who offered her a steroid injection. She declined, and was told there was nothing else that could be done for her shoulder.  She would like a referral to a different rheumatologist, for treatment of psoriatic arthritis. She is currently on MTX.  Review of Systems      Past Medical History:  Diagnosis Date  . Allergy   . Arthritis   . Chicken pox   . Diabetes mellitus without complication ([place])   . GERD (gastroesophageal reflux disease)   . Heart murmur    DUE TO RHEUMATIC FEVER PER PT  . Hyperlipidemia   . Hypertension   . Psoriasis   . Rheumatic fever   . Rosacea     Current Outpatient Prescriptions  Medication Sig Dispense Refill  . amLODipine (NORVASC) 10 MG tablet Take 10 mg by mouth daily.    . betamethasone dipropionate (DIPROLENE) 0.05 % ointment Apply 1 application topically 2 (two) times daily as needed.  psoriasis     . calcium carbonate (TUMS EX) 750 MG chewable tablet Chew 750 mg by mouth daily.    . cetirizine (ZYRTEC) 10 MG tablet Take 10 mg by mouth at bedtime.     . Cholecalciferol (VITAMIN D3) 400 UNITS CAPS Take 400 Units by mouth daily.     . clotrimazole-betamethasone (LOTRISONE) cream APPLY TOPICALLY AS DIRECTED TWICE DAILY (Patient taking differently: Apply 1 application topically 2 (two) times daily as needed (BACTERIAL INFECTION). APPLY TOPICALLY AS DIRECTED TWICE DAILY) 60 g 5  . diphenhydrAMINE (BENADRYL) 25 mg capsule Take 50 mg by mouth at bedtime.     . docusate sodium (COLACE) 100 MG capsule Take 1 capsule (100 mg total) by mouth 2 (two) times daily. 60 capsule 0  . doxycycline (ORACEA) 40 MG capsule Take 40 mg by mouth every morning.    . fluocinonide ointment (LIDEX) 1.75 % Apply 1 application topically 2 (two) times daily as needed (PSORIASIS BEHIND EARS).     . fluticasone (FLONASE) 50 MCG/ACT nasal spray Place 1 spray into both nostrils at bedtime.     . folic acid (FOLVITE) 1 MG tablet Take 1 mg by mouth daily.    Marland Kitchen glipiZIDE (GLUCOTROL) 5 MG tablet TAKE 1 TABLET BY MOUTH ONCE DAILY BEFORE  BREAKFAST 30 tablet 0  . ibuprofen (  ADVIL,MOTRIN) 200 MG tablet Take 800 mg by mouth at bedtime as needed for moderate pain.     Marland Kitchen ketoconazole (NIZORAL) 2 % shampoo Apply 1 application topically 2 (two) times a week.    Marland Kitchen lisinopril (PRINIVIL,ZESTRIL) 10 MG tablet TAKE 1 TABLET BY MOUTH ONCE DAILY 30 tablet 0  . methotrexate (RHEUMATREX) 2.5 MG tablet Take 15 mg by mouth every Friday. Caution:Chemotherapy. Protect from light.     . Omega-3 Fatty Acids (FISH OIL PO) Take 1 capsule by mouth 2 (two) times daily. 1040 mg of fish oil in each capsule    . omeprazole (PRILOSEC) 40 MG capsule Take 1 capsule (40 mg total) by mouth daily. (Patient taking differently: Take 40 mg by mouth at bedtime. ) 90 capsule 2  . Prasterone (INTRAROSA) 6.5 MG INST Place 1 capsule vaginally at bedtime. 28 each  3  . simvastatin (ZOCOR) 10 MG tablet Take 10 mg by mouth daily.    . simvastatin (ZOCOR) 10 MG tablet TAKE 1 TABLET BY MOUTH AT BEDTIME 30 tablet 0  . SOOLANTRA 1 % CREA APPLY TO THE FACE EVERY DAY  6  . vitamin C (ASCORBIC ACID) 500 MG tablet Take 500 mg by mouth daily.     No current facility-administered medications for this visit.     No Known Allergies  Family History  Problem Relation Age of Onset  . Cancer Mother        skin  . Dementia Mother   . Cancer Sister        skin and liver  . Stroke Maternal Grandmother   . Skin cancer Brother   . Diabetes Neg Hx   . Heart disease Neg Hx     Social History   Social History  . Marital status: Married    Spouse name: N/A  . Number of children: N/A  . Years of education: N/A   Occupational History  . Not on file.   Social History Main Topics  . Smoking status: Former Smoker    Packs/day: 4.00    Years: 16.00    Types: Cigarettes    Quit date: 09/12/1995  . Smokeless tobacco: Never Used     Comment: quit in 1997  . Alcohol use No  . Drug use: No  . Sexual activity: Yes    Birth control/ protection: None   Other Topics Concern  . Not on file   Social History Narrative  . No narrative on file     Constitutional: Denies fever, malaise, fatigue, headache or abrupt weight changes.  Respiratory: Denies difficulty breathing, shortness of breath, cough or sputum production.   Cardiovascular: Denies chest pain, chest tightness, palpitations or swelling in the hands or feet.  Gastrointestinal: Denies abdominal pain, bloating, constipation, diarrhea or blood in the stool.  Musculoskeletal: Pt reports right shoulder pain. Denies difficulty with gait, muscle pain or joint swelling.  Skin: Denies redness, rashes, lesions or ulcercations.  Neurological: Denies dizziness, difficulty with memory, difficulty with speech or problems with balance and coordination.    No other specific complaints in a complete review of systems  (except as listed in HPI above).  Objective:   Physical Exam   BP 124/70   Pulse 69   Temp 97.6 F (36.4 C) (Oral)   Wt 234 lb (106.1 kg)   SpO2 99%   BMI 44.21 kg/m  Wt Readings from Last 3 Encounters:  02/04/17 234 lb (106.1 kg)  01/10/17 234 lb 9 oz (106.4 kg)  12/13/16  234 lb 8 oz (106.4 kg)    General: Appears her stated age, obese in NAD. Skin: Warm, dry and intact. No ulcerations noted. Cardiovascular: Normal rate and rhythm.  Pulmonary/Chest: Normal effort and positive vesicular breath sounds. No respiratory distress. No wheezes, rales or ronchi noted.  Musculoskeletal: Normal internal rotation. Pain with external rotation of the right shoulder. Pain with palpation over the right AC joint, and subacromial bursa. Negative drop can test on the right. Neurological: Alert and oriented. Sensation intact to BLE.   BMET    Component Value Date/Time   NA 140 12/08/2016 1614   NA 143 01/07/2014   K 4.3 12/08/2016 1614   CL 103 12/08/2016 1614   CO2 28 12/08/2016 1614   GLUCOSE 114 (H) 12/08/2016 1614   BUN 15 12/08/2016 1614   CREATININE 0.86 12/08/2016 1614   CALCIUM 9.3 12/08/2016 1614   GFRNONAA >60 12/08/2016 1614   GFRAA >60 12/08/2016 1614    Lipid Panel     Component Value Date/Time   CHOL 225 (H) 10/29/2016 1404   TRIG 124.0 10/29/2016 1404   HDL 52.30 10/29/2016 1404   CHOLHDL 4 10/29/2016 1404   VLDL 24.8 10/29/2016 1404   LDLCALC 148 (H) 10/29/2016 1404    CBC    Component Value Date/Time   WBC 9.3 12/08/2016 1614   RBC 4.41 12/08/2016 1614   HGB 13.1 12/08/2016 1614   HGB 14.5 08/16/2016 1200   HCT 38.9 12/08/2016 1614   HCT 42.8 08/16/2016 1200   PLT 342 12/08/2016 1614   PLT 297 08/16/2016 1200   MCV 88.3 12/08/2016 1614   MCV 86 08/16/2016 1200   MCH 29.7 12/08/2016 1614   MCHC 33.6 12/08/2016 1614   RDW 14.9 (H) 12/08/2016 1614   RDW 13.5 08/16/2016 1200   LYMPHSABS 2.6 12/08/2016 1614   MONOABS 0.8 12/08/2016 1614   EOSABS 0.2  12/08/2016 1614   BASOSABS 0.0 12/08/2016 1614    Hgb A1C Lab Results  Component Value Date   HGBA1C 7.5 (H) 10/29/2016           Assessment & Plan:   Right Shoulder Pain secondary to Subacromial Bursitis:  Xray was done by rheum eRx for Naproxen 500 mg BID prn with meals Discussed trying to rest her arm for a week to see if this improves If persist, will consider Prednisone burst  Psoriatic Arthritis:  Referral placed to Dr. Trudie Reed  Will follow up after labs, return precautions discussed Webb Silversmith, NP

## 2017-02-04 NOTE — Assessment & Plan Note (Signed)
CMET and Lipid profile today Encouraged herto consume a low fat diet Continue Simvastatin for now, will adjust if needed based on labs 

## 2017-02-04 NOTE — Assessment & Plan Note (Signed)
A1C today No microalbumin secondary to ACEI therapy Encouraged her to consume a low carb, low fat diet and exercise for weight loss She has been having some lows, may need to decrease Glipizide Foot exam today Advised her to make an appt for an eye exam, she declines referral She declines flu or pneumovax today

## 2017-02-04 NOTE — Patient Instructions (Signed)
Bursitis Bursitis is when the fluid-filled sac (bursa) that covers and protects a joint is swollen (inflamed). Bursitis is most common near joints, especially the knees, elbows, hips, and shoulders. Follow these instructions at home:  Take medicines only as told by your doctor.  If you were prescribed an antibiotic medicine, finish it all even if you start to feel better.  Rest the affected area as told by your doctor. ? Keep the area raised up. ? Avoid doing things that make the pain worse.  Apply ice to the injured area: ? Place ice in a plastic bag. ? Place a towel between your skin and the bag. ? Leave the ice on for 20 minutes, 2-3 times a day.  Use splints, braces, pads, or walking aids as told by your doctor.  Keep all follow-up visits as told by your doctor. This is important. Contact a doctor if:  You have more pain with home care.  You have a fever.  You have chills. This information is not intended to replace advice given to you by your health care provider. Make sure you discuss any questions you have with your health care provider. Document Released: 10/17/2009 Document Revised: 10/05/2015 Document Reviewed: 07/19/2013 Elsevier Interactive Patient Education  2018 [employer]  

## 2017-02-04 NOTE — Addendum Note (Signed)
Addended by: Ellamae Sia on: 02/04/2017 10:07 AM   Modules accepted: Orders

## 2017-02-04 NOTE — Assessment & Plan Note (Signed)
Controlled on Amlodipine and Lisinopril CMET today Encouraged DASH diet and exercise for weight loss

## 2017-02-05 NOTE — Addendum Note (Signed)
Addended by: Lurlean Nanny on: 02/05/2017 12:48 PM   Modules accepted: Orders

## 2017-02-07 MED ORDER — GLIPIZIDE ER 2.5 MG PO TB24: 2.5000 mg | ORAL_TABLET | Freq: Every day | ORAL | 3 refills | Status: DC

## 2017-02-07 NOTE — Addendum Note (Signed)
Addended by: Lurlean Nanny on: 02/07/2017 09:47 AM   Modules accepted: Orders

## 2017-02-19 ENCOUNTER — Encounter: Payer: Self-pay | Admitting: Internal Medicine

## 2017-02-21 ENCOUNTER — Other Ambulatory Visit: Payer: Self-pay | Admitting: Internal Medicine

## 2017-02-21 DIAGNOSIS — E7800 Pure hypercholesterolemia, unspecified: Secondary | ICD-10-CM

## 2017-02-26 ENCOUNTER — Other Ambulatory Visit: Payer: Self-pay | Admitting: Internal Medicine

## 2017-02-26 DIAGNOSIS — E119 Type 2 diabetes mellitus without complications: Secondary | ICD-10-CM

## 2017-03-06 ENCOUNTER — Other Ambulatory Visit: Payer: Self-pay | Admitting: Internal Medicine

## 2017-03-06 NOTE — Telephone Encounter (Signed)
Last refill 02/04/17 #60  Last OV 02/04/17  Ok to refill?

## 2017-03-07 ENCOUNTER — Encounter: Payer: Self-pay | Admitting: Internal Medicine

## 2017-03-17 ENCOUNTER — Encounter: Payer: Self-pay | Admitting: Internal Medicine

## 2017-03-20 ENCOUNTER — Other Ambulatory Visit: Payer: Self-pay

## 2017-03-20 MED ORDER — PRASTERONE 6.5 MG VA INST: 1.0000 | VAGINAL_INSERT | Freq: Every day | VAGINAL | 1 refills | Status: DC

## 2017-03-21 ENCOUNTER — Other Ambulatory Visit: Payer: Self-pay

## 2017-03-21 DIAGNOSIS — E119 Type 2 diabetes mellitus without complications: Secondary | ICD-10-CM

## 2017-03-21 MED ORDER — AMLODIPINE BESYLATE 10 MG PO TABS: 10.0000 mg | ORAL_TABLET | Freq: Every day | ORAL | 0 refills | Status: DC

## 2017-03-21 MED ORDER — GLIPIZIDE ER 2.5 MG PO TB24: 2.5000 mg | ORAL_TABLET | Freq: Every day | ORAL | 0 refills | Status: DC

## 2017-03-21 MED ORDER — OMEPRAZOLE 40 MG PO CPDR: 40.0000 mg | DELAYED_RELEASE_CAPSULE | Freq: Every day | ORAL | 2 refills | Status: DC

## 2017-03-21 MED ORDER — SIMVASTATIN 10 MG PO TABS: 10.0000 mg | ORAL_TABLET | Freq: Every day | ORAL | 0 refills | Status: DC

## 2017-03-21 MED ORDER — NAPROXEN 500 MG PO TABS: ORAL_TABLET | ORAL | 0 refills | Status: DC

## 2017-03-21 MED ORDER — LISINOPRIL 10 MG PO TABS: 10.0000 mg | ORAL_TABLET | Freq: Every day | ORAL | 0 refills | Status: DC

## 2017-03-25 ENCOUNTER — Encounter: Payer: Self-pay | Admitting: Obstetrics and Gynecology

## 2017-04-10 ENCOUNTER — Ambulatory Visit (INDEPENDENT_AMBULATORY_CARE_PROVIDER_SITE_OTHER): Payer: 59 | Admitting: Obstetrics and Gynecology

## 2017-04-10 ENCOUNTER — Encounter: Payer: Self-pay | Admitting: Obstetrics and Gynecology

## 2017-04-10 VITALS — BP 124/71 | HR 71 | Ht 61.0 in | Wt 241.6 lb

## 2017-04-10 DIAGNOSIS — N952 Postmenopausal atrophic vaginitis: Secondary | ICD-10-CM

## 2017-04-10 DIAGNOSIS — R6882 Decreased libido: Secondary | ICD-10-CM

## 2017-04-10 NOTE — Progress Notes (Signed)
HPI:      [name] is a 61 y.o. G3P3 who LMP was No LMP recorded. Patient has had a hysterectomy.  Subjective:   She presents today as a follow-up to vaginal dryness and decreased libido.  She has begun intra-Rosa and states that it is working for her.  She would like to continue it. She reports that her urine leakage is much improved.  Rarely has any issues. She complains of occasional hot flashes but it is "learning to deal with these".  She realizes that because of her endometrial cancer she cannot take estrogen.    Hx: The following portions of the patient's history were reviewed and updated as appropriate:             She  has a past medical history of Allergy, Arthritis, Chicken pox, Diabetes mellitus without complication ([place]), GERD (gastroesophageal reflux disease), Heart murmur, Hyperlipidemia, Hypertension, Psoriasis, Rheumatic fever, and Rosacea. She does not have any pertinent problems on file. She  has a past surgical history that includes Mouth surgery; Dilation and curettage of uterus (N/A, 09/16/2016); Laparoscopic hysterectomy (Bilateral, 12/04/2016); Anterior and posterior repair (N/A, 12/04/2016); and Abdominal hysterectomy. Her family history includes Cancer in her mother and sister; Dementia in her mother; Skin cancer in her brother; Stroke in her maternal grandmother. She  reports that she quit smoking about 21 years ago. Her smoking use included cigarettes. She has a 64.00 pack-year smoking history. she has never used smokeless tobacco. She reports that she does not drink alcohol or use drugs. She has No Known Allergies.       Review of Systems:  Review of Systems  Constitutional: Denied constitutional symptoms, night sweats, recent illness, fatigue, fever, insomnia and weight loss.  Eyes: Denied eye symptoms, eye pain, photophobia, vision change and visual disturbance.  Ears/Nose/Throat/Neck: Denied ear, nose, throat or neck symptoms, hearing loss, nasal discharge,  sinus congestion and sore throat.  Cardiovascular: Denied cardiovascular symptoms, arrhythmia, chest pain/pressure, edema, exercise intolerance, orthopnea and palpitations.  Respiratory: Denied pulmonary symptoms, asthma, pleuritic pain, productive sputum, cough, dyspnea and wheezing.  Gastrointestinal: Denied, gastro-esophageal reflux, melena, nausea and vomiting.  Genitourinary: Denied genitourinary symptoms including symptomatic vaginal discharge, pelvic relaxation issues, and urinary complaints.  Musculoskeletal: Denied musculoskeletal symptoms, stiffness, swelling, muscle weakness and myalgia.  Dermatologic: Denied dermatology symptoms, rash and scar.  Neurologic: Denied neurology symptoms, dizziness, headache, neck pain and syncope.  Psychiatric: Denied psychiatric symptoms, anxiety and depression.  Endocrine: Denied endocrine symptoms including hot flashes and night sweats.   Meds:   Current Outpatient Medications on File Prior to Visit  Medication Sig Dispense Refill  . amLODipine (NORVASC) 10 MG tablet Take 1 tablet (10 mg total) daily by mouth. 90 tablet 0  . betamethasone dipropionate (DIPROLENE) 0.05 % ointment Apply 1 application topically 2 (two) times daily as needed. psoriasis     . calcium carbonate (TUMS EX) 750 MG chewable tablet Chew 750 mg by mouth daily.    . cetirizine (ZYRTEC) 10 MG tablet Take 10 mg by mouth at bedtime.     . Cholecalciferol (VITAMIN D3) 400 UNITS CAPS Take 400 Units by mouth daily.     . clotrimazole-betamethasone (LOTRISONE) cream APPLY TOPICALLY AS DIRECTED TWICE DAILY (Patient taking differently: Apply 1 application topically 2 (two) times daily as needed (BACTERIAL INFECTION). APPLY TOPICALLY AS DIRECTED TWICE DAILY) 60 g 5  . diphenhydrAMINE (BENADRYL) 25 mg capsule Take 50 mg by mouth at bedtime.     Marland Kitchen doxycycline (ORACEA) 40 MG  capsule Take 40 mg by mouth every morning.    . fluocinonide ointment (LIDEX) 6.43 % Apply 1 application topically 2  (two) times daily as needed (PSORIASIS BEHIND EARS).     . fluticasone (FLONASE) 50 MCG/ACT nasal spray Place 1 spray into both nostrils at bedtime.     . folic acid (FOLVITE) 1 MG tablet Take 1 mg by mouth daily.    Marland Kitchen glipiZIDE (GLIPIZIDE XL) 2.5 MG 24 hr tablet Take 1 tablet (2.5 mg total) daily with breakfast by mouth. 90 tablet 0  . ibuprofen (ADVIL,MOTRIN) 200 MG tablet Take 800 mg by mouth at bedtime as needed for moderate pain.     Marland Kitchen ketoconazole (NIZORAL) 2 % shampoo Apply 1 application topically 2 (two) times a week.    Marland Kitchen lisinopril (PRINIVIL,ZESTRIL) 10 MG tablet Take 1 tablet (10 mg total) daily by mouth. 90 tablet 0  . methotrexate (RHEUMATREX) 2.5 MG tablet Take 15 mg by mouth every Friday. Caution:Chemotherapy. Protect from light.     . naproxen (NAPROSYN) 500 MG tablet TAKE 1 TABLET BY MOUTH TWICE DAILY WITH A MEAL 180 tablet 0  . nystatin ointment (MYCOSTATIN) Apply 1 application topically 2 (two) times daily.    . Omega-3 Fatty Acids (FISH OIL PO) Take 1 capsule by mouth 2 (two) times daily. 1040 mg of fish oil in each capsule    . omeprazole (PRILOSEC) 40 MG capsule Take 1 capsule (40 mg total) daily by mouth. 90 capsule 2  . Prasterone (INTRAROSA) 6.5 MG INST Place 1 tablet at bedtime vaginally. 30 each 1  . simvastatin (ZOCOR) 10 MG tablet Take 1 tablet (10 mg total) daily by mouth. 90 tablet 0  . SOOLANTRA 1 % CREA APPLY TO THE FACE EVERY DAY  6  . vitamin C (ASCORBIC ACID) 500 MG tablet Take 500 mg by mouth daily.     No current facility-administered medications on file prior to visit.     Objective:     Vitals:   04/10/17 0935  BP: 124/71  Pulse: 71                Assessment:    G3P3 Patient Active Problem List   Diagnosis Date Noted  . Psoriatic arthritis ([place]) 02/04/2017  . Endometrial cancer ([place]) 12/04/2016  . Postmenopausal bleeding 10/29/2016  . Type 2 diabetes mellitus without complication ([place]) 32/95/1884  . Arthritis 04/12/2014  .  Gastroesophageal reflux disease without esophagitis 04/12/2014  . Seasonal allergies 04/12/2014  . Essential hypertension 04/12/2014  . HLD (hyperlipidemia) 04/12/2014  . Psoriasis 04/12/2014  . Severe obesity (BMI >= 40) ([place]) 04/12/2014     1. Vaginal atrophy   2. Decreased libido     Atrophy and libido both improved with intra Rosa.  Patient would like to continue.   Plan:            1.  Continue intra-Rosa  2.  Patient will follow-up for annual examination around the time of her surgery. Orders No orders of the defined types were placed in this encounter.   No orders of the defined types were placed in this encounter.     F/U  Return in about 8 months (around 12/08/2017) for Annual Physical. I spent 18 minutes with this patient of which greater than 50% was spent discussing libido, vaginal dryness, hot flashes, urine loss, general medical well-being.  Finis Bud, M.D. 04/10/2017 10:14 AM

## 2017-04-11 ENCOUNTER — Encounter: Payer: Self-pay | Admitting: Internal Medicine

## 2017-04-11 ENCOUNTER — Encounter: Payer: BLUE CROSS/BLUE SHIELD | Admitting: Obstetrics and Gynecology

## 2017-04-11 MED ORDER — FLUTICASONE PROPIONATE 50 MCG/ACT NA SUSP: 1.0000 | Freq: Every day | NASAL | 0 refills | Status: DC

## 2017-04-14 ENCOUNTER — Encounter: Payer: Self-pay | Admitting: Family Medicine

## 2017-04-14 ENCOUNTER — Ambulatory Visit (INDEPENDENT_AMBULATORY_CARE_PROVIDER_SITE_OTHER): Payer: 59 | Admitting: Family Medicine

## 2017-04-14 VITALS — BP 122/70 | HR 69 | Temp 98.2°F | Ht 61.0 in | Wt 246.8 lb

## 2017-04-14 DIAGNOSIS — R600 Localized edema: Secondary | ICD-10-CM

## 2017-04-14 DIAGNOSIS — I10 Essential (primary) hypertension: Secondary | ICD-10-CM | POA: Diagnosis not present

## 2017-04-14 DIAGNOSIS — R05 Cough: Secondary | ICD-10-CM

## 2017-04-14 DIAGNOSIS — R053 Chronic cough: Secondary | ICD-10-CM

## 2017-04-14 MED ORDER — HYDROCHLOROTHIAZIDE 25 MG PO TABS: 25.0000 mg | ORAL_TABLET | Freq: Every day | ORAL | 2 refills | Status: DC

## 2017-04-14 NOTE — Progress Notes (Signed)
Subjective:    Patient ID: Melissa Cabrera, female    DOB: 06/23/1955, 61 y.o.   MRN: [id number]  HPI This is a 61 yo female who presents today with leg/foot/facial/hand swelling for 5-6 days. Has had this previously when she rode a lot with her husband in his truck. She was previously on HCTZ that worked well for her, she is not sure why it was stopped.  Has not had any recent travel or dietary changes.  Does not add salt at the table or with cooking, eats out rarely.  No chest pain or shortness of breath.  She had labs done 04/08/2017, normal LFTs, normal kidney function, no anemia.  Protein level little low.   Has had occasional headaches, resolved spontaneously.   Has noticed dry cough for several months, thinks it started when she started lisinopril.   She stays active, cleans her church weekly. Cant seem to lose weight.    Past Medical History:  Diagnosis Date  . Allergy   . Arthritis   . Chicken pox   . Diabetes mellitus without complication ([place])   . GERD (gastroesophageal reflux disease)   . Heart murmur    DUE TO RHEUMATIC FEVER PER PT  . Hyperlipidemia   . Hypertension   . Psoriasis   . Rheumatic fever   . Rosacea    Past Surgical History:  Procedure Laterality Date  . ABDOMINAL HYSTERECTOMY    . ANTERIOR AND POSTERIOR REPAIR N/A 12/04/2016   Procedure: ANTERIOR (CYSTOCELE) AND POSTERIOR REPAIR (RECTOCELE), TRANSOBTURATOR SLING PLACEMENT(ARIS);  Surgeon: Harlin Heys, MD;  Location: [facility] ORS;  Service: Gynecology;  Laterality: N/A;  . DILATION AND CURETTAGE OF UTERUS N/A 09/16/2016   Procedure: DILATATION AND CURETTAGE;  Surgeon: Harlin Heys, MD;  Location: [facility] ORS;  Service: Gynecology;  Laterality: N/A;  . LAPAROSCOPIC HYSTERECTOMY Bilateral 12/04/2016   Procedure: HYSTERECTOMY TOTAL LAPAROSCOPIC WITH BILATERAL SALPINGO OOPHERECTOMY;  Surgeon: Harlin Heys, MD;  Location: [facility] ORS;  Service: Gynecology;  Laterality: Bilateral;  . MOUTH SURGERY      Family History  Problem Relation Age of Onset  . Cancer Mother        skin  . Dementia Mother   . Cancer Sister        skin and liver  . Stroke Maternal Grandmother   . Skin cancer Brother   . Diabetes Neg Hx   . Heart disease Neg Hx    Social History   Tobacco Use  . Smoking status: Former Smoker    Packs/day: 4.00    Years: 16.00    Pack years: 64.00    Types: Cigarettes    Last attempt to quit: 09/12/1995    Years since quitting: 21.6  . Smokeless tobacco: Never Used  . Tobacco comment: quit in 1997  Substance Use Topics  . Alcohol use: No    Alcohol/week: 0.0 oz  . Drug use: No      Review of Systems Per HPI    Objective:   Physical Exam  Constitutional: She is oriented to person, place, and time. She appears well-developed and well-nourished. No distress.  Obese.   HENT:  Head: Normocephalic and atraumatic.  Eyes: Conjunctivae are normal.  Cardiovascular: Normal rate, regular rhythm and normal heart sounds.  Pulmonary/Chest: Effort normal and breath sounds normal.  Musculoskeletal: She exhibits edema.  Bilateral lower extremity edema to knees, +1, normal skin color and temperature.  Joints of hands enlarged, do not appreciate any edema.  Neurological: She is  alert and oriented to person, place, and time.  Skin: Skin is warm and dry. She is not diaphoretic.  Psychiatric: She has a normal mood and affect. Her behavior is normal. Judgment and thought content normal.  Vitals reviewed.     BP 122/70   Pulse 69   Temp 98.2 F (36.8 C) (Oral)   Ht 5\' 1"  (1.549 m)   Wt 246 lb 12.8 oz (111.9 kg)   SpO2 98%   BMI 46.63 kg/m  Wt Readings from Last 3 Encounters:  04/14/17 246 lb 12.8 oz (111.9 kg)  04/10/17 241 lb 9 oz (109.6 kg)  02/04/17 234 lb (106.1 kg)       Assessment & Plan:  1. Essential hypertension - hydrochlorothiazide (HYDRODIURIL) 25 MG tablet; Take 1 tablet (25 mg total) by mouth daily.  Dispense: 30 tablet; Refill: 2 -Follow-up with  PCP in 1 month  2. Lower leg edema -Encouraged her to elevate legs when sitting, with compression socks, monitor sodium intake, increase protein intake - hydrochlorothiazide (HYDRODIURIL) 25 MG tablet; Take 1 tablet (25 mg total) by mouth daily.  Dispense: 30 tablet; Refill: 2   3. Persistent dry cough -Possibly due to lisinopril, will have her hold it for now and start HCTZ, may need ARB for kidney protective benefits with her diabetes, hemoglobin A1c has been well controlled   Clarene Reamer, FNP-BC   Primary Care at [facility], [place]  04/14/2017 12:45 PM

## 2017-04-14 NOTE — Patient Instructions (Signed)
Stop lisinopril, start HCTZ Follow up in 1 month, sooner if no improvement

## 2017-05-01 ENCOUNTER — Encounter: Payer: Self-pay | Admitting: Internal Medicine

## 2017-05-01 DIAGNOSIS — L4050 Arthropathic psoriasis, unspecified: Secondary | ICD-10-CM

## 2017-05-09 ENCOUNTER — Other Ambulatory Visit: Payer: Self-pay | Admitting: Internal Medicine

## 2017-05-09 DIAGNOSIS — E119 Type 2 diabetes mellitus without complications: Secondary | ICD-10-CM

## 2017-05-15 ENCOUNTER — Encounter: Payer: Self-pay | Admitting: Internal Medicine

## 2017-05-16 ENCOUNTER — Other Ambulatory Visit (INDEPENDENT_AMBULATORY_CARE_PROVIDER_SITE_OTHER): Payer: 59

## 2017-05-16 DIAGNOSIS — E119 Type 2 diabetes mellitus without complications: Secondary | ICD-10-CM

## 2017-05-16 LAB — HEMOGLOBIN A1C: Hgb A1c MFr Bld: 6.1 % (ref 4.6–6.5)

## 2017-05-20 ENCOUNTER — Ambulatory Visit: Payer: 59 | Admitting: Internal Medicine

## 2017-06-06 ENCOUNTER — Telehealth: Payer: Self-pay | Admitting: Internal Medicine

## 2017-06-06 DIAGNOSIS — L4050 Arthropathic psoriasis, unspecified: Secondary | ICD-10-CM

## 2017-06-06 NOTE — Telephone Encounter (Signed)
Patient is calling in and has changed her mind and wants to see a New Rheumatologist, please place a New Referral. She also wants your opinion where to go. [facility] Or [facility]. Please place referral and where you suggest she go. I also need you to print previous [facility] Rheum labs and notes if possible to send to New Rheum as I think its in [place] and I dont have access.

## 2017-06-06 NOTE — Addendum Note (Signed)
Addended by: [name] on: 06/06/2017 04:29 PM   Modules accepted: Orders

## 2017-06-06 NOTE — Telephone Encounter (Signed)
Referral placed.

## 2017-06-10 ENCOUNTER — Encounter: Payer: Self-pay | Admitting: Internal Medicine

## 2017-06-17 ENCOUNTER — Ambulatory Visit (INDEPENDENT_AMBULATORY_CARE_PROVIDER_SITE_OTHER)
Admission: RE | Admit: 2017-06-17 | Discharge: 2017-06-17 | Disposition: A | Discharge status: Home / Self Care | Payer: 59 | Source: Ambulatory Visit | Attending: Internal Medicine | Admitting: Internal Medicine

## 2017-06-17 ENCOUNTER — Encounter: Payer: Self-pay | Admitting: Internal Medicine

## 2017-06-17 ENCOUNTER — Ambulatory Visit (INDEPENDENT_AMBULATORY_CARE_PROVIDER_SITE_OTHER): Payer: 59 | Admitting: Internal Medicine

## 2017-06-17 VITALS — BP 130/78 | HR 75 | Temp 98.3°F | Wt 250.0 lb

## 2017-06-17 DIAGNOSIS — R609 Edema, unspecified: Secondary | ICD-10-CM

## 2017-06-17 DIAGNOSIS — R05 Cough: Secondary | ICD-10-CM

## 2017-06-17 DIAGNOSIS — R053 Chronic cough: Secondary | ICD-10-CM

## 2017-06-17 DIAGNOSIS — M7551 Bursitis of right shoulder: Secondary | ICD-10-CM

## 2017-06-17 LAB — BASIC METABOLIC PANEL
BUN: 21 mg/dL (ref 6–23)
CALCIUM: 9.3 mg/dL (ref 8.4–10.5)
CO2: 30 mEq/L (ref 19–32)
Chloride: 105 mEq/L (ref 96–112)
Creatinine, Ser: 0.95 mg/dL (ref 0.40–1.20)
GFR: 63.52 mL/min (ref >60.00)
GLUCOSE: 103 mg/dL — AB (ref 70–99)
POTASSIUM: 3.9 meq/L (ref 3.5–5.1)
SODIUM: 142 meq/L (ref 135–145)

## 2017-06-17 LAB — BRAIN NATRIURETIC PEPTIDE: PRO B NATRI PEPTIDE: 25 pg/mL (ref 0.0–100.0)

## 2017-06-17 MED ORDER — PREDNISONE 10 MG PO TABS: ORAL_TABLET | ORAL | 0 refills | Status: DC

## 2017-06-17 MED ORDER — FUROSEMIDE 40 MG PO TABS: 40.0000 mg | ORAL_TABLET | Freq: Every day | ORAL | 0 refills | Status: DC

## 2017-06-17 NOTE — Patient Instructions (Signed)
Edema Edema is when you have too much fluid in your body or under your skin. Edema may make your legs, feet, and ankles swell up. Swelling is also common in looser tissues, like around your eyes. This is a common condition. It gets more common as you get older. There are many possible causes of edema. Eating too much salt (sodium) and being on your feet or sitting for a long time can cause edema in your legs, feet, and ankles. Hot weather may make edema worse. Edema is usually painless. Your skin may look swollen or shiny. Follow these instructions at home:  Keep the swollen body part raised (elevated) above the level of your heart when you are sitting or lying down.  Do not sit still or stand for a long time.  Do not wear tight clothes. Do not wear garters on your upper legs.  Exercise your legs. This can help the swelling go down.  Wear elastic bandages or support stockings as told by your doctor.  Eat a low-salt (low-sodium) diet to reduce fluid as told by your doctor.  Depending on the cause of your swelling, you may need to limit how much fluid you drink (fluid restriction).  Take over-the-counter and prescription medicines only as told by your doctor. Contact a doctor if:  Treatment is not working.  You have heart, liver, or kidney disease and have symptoms of edema.  You have sudden and unexplained weight gain. Get help right away if:  You have shortness of breath or chest pain.  You cannot breathe when you lie down.  You have pain, redness, or warmth in the swollen areas.  You have heart, liver, or kidney disease and get edema all of a sudden.  You have a fever and your symptoms get worse all of a sudden. Summary  Edema is when you have too much fluid in your body or under your skin.  Edema may make your legs, feet, and ankles swell up. Swelling is also common in looser tissues, like around your eyes.  Raise (elevate) the swollen body part above the level of your  heart when you are sitting or lying down.  Follow your doctor's instructions about diet and how much fluid you can drink (fluid restriction). This information is not intended to replace advice given to you by your health care provider. Make sure you discuss any questions you have with your health care provider. Document Released: 10/16/2007 Document Revised: 05/17/2016 Document Reviewed: 05/17/2016 Elsevier Interactive Patient Education  2017 Elsevier Inc.  

## 2017-06-17 NOTE — Progress Notes (Addendum)
Subjective:    Patient ID: Melissa Cabrera, female    DOB: [birth year], 62 y.o.   MRN: [id number]  HPI  Pt presents to the clinic today with c/o swelling in her feet and hands. This has been going on for a few months. She saw Tor Netters, NP 04/14/17 for the same. Lisinopril was d/c'd due to dry cough (which is still present). She was started on HCTZ 25 mg daily and reports she has not noticed any improvement. Her legs are swollen but not red or painful. She is mildly short of breath. She denies runny nose, nasal congestion, sore throatShe has been keeping her legs elevated without any relief.  She also reports persistent right shoulder pain. She reports she saw her rheumatologist who told her it was bursitis. She was given Naproxen which she reports is not helping. She would like to discuss alternative treatment options.  Review of Systems      Past Medical History:  Diagnosis Date  . Allergy   . Arthritis   . Chicken pox   . Diabetes mellitus without complication ([place])   . GERD (gastroesophageal reflux disease)   . Heart murmur    DUE TO RHEUMATIC FEVER PER PT  . Hyperlipidemia   . Hypertension   . Psoriasis   . Rheumatic fever   . Rosacea     Current Outpatient Medications  Medication Sig Dispense Refill  . amLODipine (NORVASC) 10 MG tablet Take 1 tablet (10 mg total) daily by mouth. 90 tablet 0  . betamethasone dipropionate (DIPROLENE) 0.05 % ointment Apply 1 application topically 2 (two) times daily as needed. psoriasis     . calcium carbonate (TUMS EX) 750 MG chewable tablet Chew 750 mg by mouth daily.    . cetirizine (ZYRTEC) 10 MG tablet Take 10 mg by mouth at bedtime.     . Cholecalciferol (VITAMIN D3) 400 UNITS CAPS Take 400 Units by mouth daily.     . clotrimazole-betamethasone (LOTRISONE) cream APPLY TOPICALLY AS DIRECTED TWICE DAILY (Patient taking differently: Apply 1 application topically 2 (two) times daily as needed (BACTERIAL INFECTION). APPLY TOPICALLY AS  DIRECTED TWICE DAILY) 60 g 5  . diphenhydrAMINE (BENADRYL) 25 mg capsule Take 50 mg by mouth at bedtime.     Marland Kitchen doxycycline (ORACEA) 40 MG capsule Take 40 mg by mouth every morning.    . fluocinonide ointment (LIDEX) 3.57 % Apply 1 application topically 2 (two) times daily as needed (PSORIASIS BEHIND EARS).     . fluticasone (FLONASE) 50 MCG/ACT nasal spray Place 1 spray into both nostrils at bedtime. 48 g 0  . folic acid (FOLVITE) 1 MG tablet Take 1 mg by mouth daily.    Marland Kitchen glipiZIDE (GLIPIZIDE XL) 2.5 MG 24 hr tablet Take 1 tablet (2.5 mg total) daily with breakfast by mouth. 90 tablet 0  . hydrochlorothiazide (HYDRODIURIL) 25 MG tablet Take 1 tablet (25 mg total) by mouth daily. 30 tablet 2  . ibuprofen (ADVIL,MOTRIN) 200 MG tablet Take 800 mg by mouth at bedtime as needed for moderate pain.     Marland Kitchen ketoconazole (NIZORAL) 2 % shampoo Apply 1 application topically 2 (two) times a week.    . methotrexate (RHEUMATREX) 2.5 MG tablet Take 15 mg by mouth every Friday. Caution:Chemotherapy. Protect from light.     . naproxen (NAPROSYN) 500 MG tablet TAKE 1 TABLET BY MOUTH TWICE DAILY WITH A MEAL 180 tablet 0  . nystatin ointment (MYCOSTATIN) Apply 1 application topically 2 (two) times daily.    Marland Kitchen  Omega-3 Fatty Acids (FISH OIL PO) Take 1 capsule by mouth 2 (two) times daily. 1040 mg of fish oil in each capsule    . omeprazole (PRILOSEC) 40 MG capsule Take 1 capsule (40 mg total) daily by mouth. 90 capsule 2  . Prasterone (INTRAROSA) 6.5 MG INST Place 1 tablet at bedtime vaginally. 30 each 1  . simvastatin (ZOCOR) 10 MG tablet Take 1 tablet (10 mg total) daily by mouth. 90 tablet 0  . SOOLANTRA 1 % CREA APPLY TO THE FACE EVERY DAY  6  . vitamin C (ASCORBIC ACID) 500 MG tablet Take 500 mg by mouth daily.    Marland Kitchen lisinopril (PRINIVIL,ZESTRIL) 10 MG tablet Take 1 tablet (10 mg total) daily by mouth. (Patient not taking: Reported on 06/17/2017) 90 tablet 0   No current facility-administered medications for this  visit.     No Known Allergies  Family History  Problem Relation Age of Onset  . Cancer Mother        skin  . Dementia Mother   . Cancer Sister        skin and liver  . Stroke Maternal Grandmother   . Skin cancer Brother   . Diabetes Neg Hx   . Heart disease Neg Hx     Social History   Socioeconomic History  . Marital status: Married    Spouse name: Not on file  . Number of children: Not on file  . Years of education: Not on file  . Highest education level: Not on file  Social Needs  . Financial resource strain: Not on file  . Food insecurity - worry: Not on file  . Food insecurity - inability: Not on file  . Transportation needs - medical: Not on file  . Transportation needs - non-medical: Not on file  Occupational History  . Not on file  Tobacco Use  . Smoking status: Former Smoker    Packs/day: 4.00    Years: 16.00    Pack years: 64.00    Types: Cigarettes    Last attempt to quit: 09/12/1995    Years since quitting: 21.7  . Smokeless tobacco: Never Used  . Tobacco comment: quit in 1997  Substance and Sexual Activity  . Alcohol use: No    Alcohol/week: 0.0 oz  . Drug use: No  . Sexual activity: Yes    Birth control/protection: None  Other Topics Concern  . Not on file  Social History Narrative  . Not on file     Constitutional: Denies fever, malaise, fatigue, headache or abrupt weight changes.  HEENT: Denies eye pain, eye redness, ear pain, ringing in the ears, wax buildup, runny nose, nasal congestion, bloody nose, or sore throat. Respiratory: Pt reports cough and shortness of breath. Denies difficulty breathing, or sputum production.   Cardiovascular: Pt reports swelling in legs. Denies chest pain, chest tightness, palpitations or swelling in the hands.  MSK: Pt reports right shoulder pain. Denies decrease in ROM, muscle pain or weakness. Neurological: Denies dizziness, difficulty with memory, difficulty with speech or problems with balance and  coordination.    No other specific complaints in a complete review of systems (except as listed in HPI above).  Objective:   Physical Exam   BP 130/78   Pulse 75   Temp 98.3 F (36.8 C) (Oral)   Wt 250 lb (113.4 kg)   SpO2 98%   BMI 47.24 kg/m  Wt Readings from Last 3 Encounters:  06/17/17 250 lb (113.4 kg)  04/14/17  246 lb 12.8 oz (111.9 kg)  04/10/17 241 lb 9 oz (109.6 kg)    General: Appears her stated age, obese in NAD. HEENT:  Throat/Mouth: Teeth present, mucosa pink and moist, no exudate, lesions or ulcerations noted.  Neck:  No adenopathy noted. Cardiovascular: Normal rate and rhythm. S1,S2 noted.  No murmur, rubs or gallops noted. 2+ pitting BLE edema.  Pulmonary/Chest: Normal effort and positive vesicular breath sounds. No respiratory distress. No wheezes, rales or ronchi noted.  MSK: Normal internal and external rotation of the right shoulder. Pain with palpation of the subacromial bursa. Neurological: Alert and oriented.    BMET    Component Value Date/Time   NA 142 06/17/2017 1209   NA 143 01/07/2014   K 3.9 06/17/2017 1209   CL 105 06/17/2017 1209   CO2 30 06/17/2017 1209   GLUCOSE 103 (H) 06/17/2017 1209   BUN 21 06/17/2017 1209   CREATININE 0.95 06/17/2017 1209   CALCIUM 9.3 06/17/2017 1209   GFRNONAA >60 12/08/2016 1614   GFRAA >60 12/08/2016 1614    Lipid Panel     Component Value Date/Time   CHOL 181 02/04/2017 1007   TRIG 116.0 02/04/2017 1007   HDL 60.50 02/04/2017 1007   CHOLHDL 3 02/04/2017 1007   VLDL 23.2 02/04/2017 1007   LDLCALC 97 02/04/2017 1007    CBC    Component Value Date/Time   WBC 9.3 12/08/2016 1614   RBC 4.41 12/08/2016 1614   HGB 13.1 12/08/2016 1614   HGB 14.5 08/16/2016 1200   HCT 38.9 12/08/2016 1614   HCT 42.8 08/16/2016 1200   PLT 342 12/08/2016 1614   PLT 297 08/16/2016 1200   MCV 88.3 12/08/2016 1614   MCV 86 08/16/2016 1200   MCH 29.7 12/08/2016 1614   MCHC 33.6 12/08/2016 1614   RDW 14.9 (H)  12/08/2016 1614   RDW 13.5 08/16/2016 1200   LYMPHSABS 2.6 12/08/2016 1614   MONOABS 0.8 12/08/2016 1614   EOSABS 0.2 12/08/2016 1614   BASOSABS 0.0 12/08/2016 1614    Hgb A1C Lab Results  Component Value Date   HGBA1C 6.1 05/16/2017           Assessment & Plan:   Peripheral Edema, Persistent Cough:  Chest xray today BMET and BNP Will consider echocardiogram if not improving Hold HCTZ Lasix 40 mg daily x 5 days, encouraged elevation Amlodipine may be making swelling worse, consider switching back to Lisinopril On antihistamine and PPI, may need referral to pulmonology for further evaluation of cough  Bursitis, Right Shoulder:  eRx for Pred Taper x 9 days Hold NSAIDs Consider eval with Dr. Lorelei Pont if no improvement  Return precautions discussed, will follow up after lab and xray Webb Silversmith, NP

## 2017-06-18 ENCOUNTER — Other Ambulatory Visit: Payer: Self-pay | Admitting: Internal Medicine

## 2017-06-18 ENCOUNTER — Encounter: Payer: Self-pay | Admitting: Internal Medicine

## 2017-06-18 ENCOUNTER — Other Ambulatory Visit: Payer: Self-pay | Admitting: Obstetrics and Gynecology

## 2017-06-18 DIAGNOSIS — E119 Type 2 diabetes mellitus without complications: Secondary | ICD-10-CM

## 2017-06-24 ENCOUNTER — Encounter: Payer: Self-pay | Admitting: Internal Medicine

## 2017-06-24 ENCOUNTER — Other Ambulatory Visit: Payer: Self-pay | Admitting: Internal Medicine

## 2017-06-24 DIAGNOSIS — I10 Essential (primary) hypertension: Secondary | ICD-10-CM

## 2017-06-24 DIAGNOSIS — E119 Type 2 diabetes mellitus without complications: Secondary | ICD-10-CM

## 2017-06-24 DIAGNOSIS — R0602 Shortness of breath: Secondary | ICD-10-CM

## 2017-06-24 DIAGNOSIS — G8929 Other chronic pain: Secondary | ICD-10-CM

## 2017-06-24 DIAGNOSIS — R059 Cough, unspecified: Secondary | ICD-10-CM

## 2017-06-24 DIAGNOSIS — R05 Cough: Secondary | ICD-10-CM

## 2017-06-24 DIAGNOSIS — M25511 Pain in right shoulder: Principal | ICD-10-CM

## 2017-06-24 MED ORDER — LISINOPRIL 10 MG PO TABS: 10.0000 mg | ORAL_TABLET | Freq: Every day | ORAL | 3 refills | Status: DC

## 2017-06-26 ENCOUNTER — Ambulatory Visit (INDEPENDENT_AMBULATORY_CARE_PROVIDER_SITE_OTHER)
Admission: RE | Admit: 2017-06-26 | Discharge: 2017-06-26 | Disposition: A | Discharge status: Home / Self Care | Payer: 59 | Source: Ambulatory Visit | Attending: Internal Medicine | Admitting: Internal Medicine

## 2017-06-26 DIAGNOSIS — G8929 Other chronic pain: Secondary | ICD-10-CM | POA: Diagnosis not present

## 2017-06-26 DIAGNOSIS — M25511 Pain in right shoulder: Secondary | ICD-10-CM

## 2017-06-27 ENCOUNTER — Encounter: Payer: Self-pay | Admitting: Internal Medicine

## 2017-07-02 ENCOUNTER — Encounter: Payer: Self-pay | Admitting: Internal Medicine

## 2017-07-03 ENCOUNTER — Other Ambulatory Visit: Payer: Self-pay | Admitting: Internal Medicine

## 2017-07-07 ENCOUNTER — Encounter: Payer: Self-pay | Admitting: Family Medicine

## 2017-07-07 DIAGNOSIS — R600 Localized edema: Secondary | ICD-10-CM

## 2017-07-07 DIAGNOSIS — I10 Essential (primary) hypertension: Secondary | ICD-10-CM

## 2017-07-08 ENCOUNTER — Other Ambulatory Visit: Payer: Self-pay | Admitting: Internal Medicine

## 2017-07-08 MED ORDER — HYDROCHLOROTHIAZIDE 25 MG PO TABS: 25.0000 mg | ORAL_TABLET | Freq: Every day | ORAL | 0 refills | Status: DC

## 2017-07-16 ENCOUNTER — Other Ambulatory Visit: Payer: Self-pay | Admitting: Internal Medicine

## 2017-07-16 DIAGNOSIS — R0602 Shortness of breath: Secondary | ICD-10-CM

## 2017-07-24 ENCOUNTER — Ambulatory Visit (INDEPENDENT_AMBULATORY_CARE_PROVIDER_SITE_OTHER): Payer: 59

## 2017-07-24 ENCOUNTER — Other Ambulatory Visit: Payer: Self-pay

## 2017-07-24 DIAGNOSIS — R0602 Shortness of breath: Secondary | ICD-10-CM

## 2017-07-28 ENCOUNTER — Encounter: Payer: Self-pay | Admitting: Internal Medicine

## 2017-07-30 ENCOUNTER — Other Ambulatory Visit: Payer: Self-pay | Admitting: Internal Medicine

## 2017-07-30 DIAGNOSIS — I5189 Other ill-defined heart diseases: Secondary | ICD-10-CM

## 2017-07-30 MED ORDER — FUROSEMIDE 20 MG PO TABS: 20.0000 mg | ORAL_TABLET | Freq: Every day | ORAL | 3 refills | Status: DC

## 2017-07-30 NOTE — Progress Notes (Signed)
lsix

## 2017-08-26 ENCOUNTER — Other Ambulatory Visit: Payer: Self-pay

## 2017-08-26 MED ORDER — FUROSEMIDE 20 MG PO TABS: 20.0000 mg | ORAL_TABLET | Freq: Every day | ORAL | 0 refills | Status: DC

## 2017-09-15 DIAGNOSIS — R600 Localized edema: Secondary | ICD-10-CM | POA: Insufficient documentation

## 2017-09-15 NOTE — Progress Notes (Signed)
Cardiology Office Note  Date:  09/16/2017   ID:  Melissa Cabrera, DOB [birth year], MRN [id number]  PCP:  Melissa Fenton, NP   Chief Complaint  Patient presents with  . other    Diastolic dysfunction. Meds reviewed verbally with pt.    HPI:  [name] is a 62 year old woman with past medical history of Former smoker, quit 1997, smoked for 25 yr morbid obesity Referred by Melissa Cabrera for consultation of diastolic dysfunction, leg swelling, cough  She reports having severe leg/foot/facial/hand swelling dating back to December 2018 Has had this previously when she rode a lot with her husband in his truck. Previous on amlodipine but this was held Was on lisinopril for short period time but had a cough and lisinopril was held Placed on HCTZ Then changed to Lasix, has been on Lasix for 1-2 months Placed back on lisinopril when her cough persisted  She reports improvement in her leg swelling on the Lasix Concerned about finding of grade 2 diastolic dysfunction on echocardiogram No regular exercise program Denies significant shortness of breath  Cough presents when she lays back in a recliner or when she lays in her bed Sometimes with exertion  On discussion of whether she has a sleep disorder she is concerned about episodes where she wakes up acutely with palpitations, startled  EKG personally reviewed by myself on todays visit Shows normal sinus rhythm with rate 74 bpm no significant ST or T-wave changes  Echo 07/24/2017 Results reviewed with her in detail Left ventricle: The cavity size was normal. Systolic function was   normal. The estimated ejection fraction was in the range of 55%   to 60%. Wall motion was normal; there were no regional wall   motion abnormalities. Features are consistent with a pseudonormal   left ventricular filling pattern, with concomitant abnormal   relaxation and increased filling pressure (grade 2 diastolic   dysfunction). - Mitral valve:  There was mild regurgitation. - Left atrium: The atrium was at the upper limits of normal in   size. - Right ventricle: Systolic function was normal. - Pulmonary arteries: Systolic pressure was midlly elevated. PA   peak pressure: 35 mm Hg (S).   PMH:   has a past medical history of Allergy, Arthritis, Cancer ([place]), Chicken pox, Diabetes mellitus without complication ([place]), GERD (gastroesophageal reflux disease), Heart murmur, Hyperlipidemia, Hypertension, Psoriasis, Rheumatic fever, and Rosacea.  PSH:    Past Surgical History:  Procedure Laterality Date  . ABDOMINAL HYSTERECTOMY    . ANTERIOR AND POSTERIOR REPAIR N/A 12/04/2016   Procedure: ANTERIOR (CYSTOCELE) AND POSTERIOR REPAIR (RECTOCELE), TRANSOBTURATOR SLING PLACEMENT(ARIS);  Surgeon: Harlin Heys, MD;  Location: [facility] ORS;  Service: Gynecology;  Laterality: N/A;  . DILATION AND CURETTAGE OF UTERUS N/A 09/16/2016   Procedure: DILATATION AND CURETTAGE;  Surgeon: Harlin Heys, MD;  Location: [facility] ORS;  Service: Gynecology;  Laterality: N/A;  . LAPAROSCOPIC HYSTERECTOMY Bilateral 12/04/2016   Procedure: HYSTERECTOMY TOTAL LAPAROSCOPIC WITH BILATERAL SALPINGO OOPHERECTOMY;  Surgeon: Harlin Heys, MD;  Location: [facility] ORS;  Service: Gynecology;  Laterality: Bilateral;  . MOUTH SURGERY      Current Outpatient Medications  Medication Sig Dispense Refill  . betamethasone dipropionate (DIPROLENE) 0.05 % ointment Apply 1 application topically 2 (two) times daily as needed. psoriasis     . calcium carbonate (TUMS EX) 750 MG chewable tablet Chew 750 mg by mouth daily.    . cetirizine (ZYRTEC) 10 MG tablet Take 10 mg by mouth at bedtime.     Marland Kitchen  Cholecalciferol (VITAMIN D3) 400 UNITS CAPS Take 400 Units by mouth daily.     . clotrimazole-betamethasone (LOTRISONE) cream APPLY TOPICALLY AS DIRECTED TWICE DAILY (Patient taking differently: Apply 1 application topically 2 (two) times daily as needed (BACTERIAL INFECTION). APPLY  TOPICALLY AS DIRECTED TWICE DAILY) 60 g 5  . diphenhydrAMINE (BENADRYL) 25 mg capsule Take 50 mg by mouth at bedtime.     Marland Kitchen doxycycline (ORACEA) 40 MG capsule Take 40 mg by mouth every morning.    . fluocinonide ointment (LIDEX) 7.40 % Apply 1 application topically 2 (two) times daily as needed (PSORIASIS BEHIND EARS).     . fluticasone (FLONASE) 50 MCG/ACT nasal spray USE 1 SPRAY INTO BOTH  NOSTRILS AT BEDTIME. 16 g 11  . folic acid (FOLVITE) 1 MG tablet Take 1 mg by mouth daily.    . furosemide (LASIX) 20 MG tablet Take 1 tablet (20 mg total) by mouth daily. 90 tablet 0  . glipiZIDE (GLUCOTROL XL) 2.5 MG 24 hr tablet TAKE 1 TABLET BY MOUTH  DAILY WITH BREAKFAST 90 tablet 1  . ketoconazole (NIZORAL) 2 % shampoo Apply 1 application topically 2 (two) times a week.    . methotrexate (RHEUMATREX) 2.5 MG tablet Take 15 mg by mouth every Friday. Caution:Chemotherapy. Protect from light.     . naproxen (NAPROSYN) 500 MG tablet TAKE 1 TABLET BY MOUTH  TWICE DAILY WITH A MEAL 180 tablet 1  . nystatin ointment (MYCOSTATIN) Apply 1 application topically 2 (two) times daily.    . Omega-3 Fatty Acids (FISH OIL PO) Take 1 capsule by mouth 2 (two) times daily. 1040 mg of fish oil in each capsule    . simvastatin (ZOCOR) 10 MG tablet TAKE 1 TABLET BY MOUTH  DAILY 90 tablet 1  . SOOLANTRA 1 % CREA APPLY TO THE FACE EVERY DAY  6  . vitamin C (ASCORBIC ACID) 500 MG tablet Take 500 mg by mouth daily.    Marland Kitchen losartan (COZAAR) 50 MG tablet Take 1 tablet (50 mg total) by mouth daily. 30 tablet 6   No current facility-administered medications for this visit.      Allergies:   Patient has no known allergies.   Social History:  The patient  reports that she quit smoking about 22 years ago. Her smoking use included cigarettes. She has a 64.00 pack-year smoking history. She has never used smokeless tobacco. She reports that she does not drink alcohol or use drugs.   Family History:   family history includes Cancer in  her mother and sister; Dementia in her mother; Skin cancer in her brother; Stroke in her maternal grandmother.    Review of Systems: Review of Systems  Constitutional: Negative.   Respiratory: Positive for cough.   Cardiovascular: Positive for leg swelling.  Gastrointestinal: Negative.   Musculoskeletal: Negative.   Neurological: Negative.   Psychiatric/Behavioral: Negative.   All other systems reviewed and are negative.    PHYSICAL EXAM: VS:  BP 119/82 (BP Location: Right Arm, Patient Position: Sitting, Cuff Size: Large)   Pulse 74   Ht 5' (1.524 m)   Wt 241 lb 12 oz (109.7 kg)   BMI 47.21 kg/m  , BMI Body mass index is 47.21 kg/m. GEN: Well nourished, well developed, in no acute distress , coughing during her visit today HEENT: normal  Neck: no JVD, carotid bruits, or masses Cardiac: RRR; no murmurs, rubs, or gallops,leg swelling with no pitting edema Respiratory:  clear to auscultation bilaterally, normal work of breathing GI:  soft, nontender, nondistended, + BS MS: no deformity or atrophy  Skin: warm and dry, no rash Neuro:  Strength and sensation are intact Psych: euthymic mood, full affect    Recent Labs: 10/29/2016: TSH 1.47 12/08/2016: Hemoglobin 13.1; Platelets 342 02/04/2017: ALT 13 06/17/2017: BUN 21; Creatinine, Ser 0.95; Potassium 3.9; Pro B Natriuretic peptide (BNP) 25.0; Sodium 142    Lipid Panel Lab Results  Component Value Date   CHOL 181 02/04/2017   HDL 60.50 02/04/2017   LDLCALC 97 02/04/2017   TRIG 116.0 02/04/2017      Wt Readings from Last 3 Encounters:  09/16/17 241 lb 12 oz (109.7 kg)  06/17/17 250 lb (113.4 kg)  04/14/17 246 lb 12.8 oz (111.9 kg)       ASSESSMENT AND PLAN:  Type 2 diabetes mellitus without complication, without long-term current use of insulin ([place]) We have encouraged continued exercise, careful diet management in an effort to lose weight.  Severe obesity (BMI >= 40) (HCC) Recommended low carbohydrate diet,  walking program  Pure hypercholesterolemia Reasonable numbers on low-dose simvastatin  Lower extremity edema Previous severe leg swelling improved without amlodipine and on Lasix BMP pending to make sure she is not dehydrated/prerenal Currently not on a potassium pill Residual swelling likely component of venous insufficiency, unable to exclude lymphedema Likely exacerbated by her weight Could wear compression hose  Essential hypertension Long discussion concerning ACE inhibitors and cough Although less likely this is contributing to her cough, recommended she changed to losartan 50 mg daily  Diastolic dysfunction - Plan: EKG 12-Lead long discussion with her concerning diastolic parameters Suspect she will need outpatient sleep study Diastolic dysfunction can contribute to fluid retention Would continue Lasix for now with BMP pending If prerenal may need to decrease dosing to every other day  Cough Concerning for bronchospastic cough Suspect she will need sleep study, Evaluation by pulmonary We'll hold off on starting inhalers at this time Less likely fluid given she's been on Lasix for long period of timeand no significant improvement in her cough ACE inhibitor change to ARB  Disposition:   F/U  As needed   Total encounter time more than 60 minutes  Greater than 50% was spent in counseling and coordination of care with the patient  Patient seen in consultation for [facility] and will be referred back to her office from going care of issues detailed above   Orders Placed This Encounter  Procedures  . EKG 12-Lead     Signed, Esmond Plants, M.D., Ph.D. 09/16/2017  [place], [place]

## 2017-09-16 ENCOUNTER — Encounter: Payer: Self-pay | Admitting: Cardiovascular Disease

## 2017-09-16 ENCOUNTER — Ambulatory Visit (INDEPENDENT_AMBULATORY_CARE_PROVIDER_SITE_OTHER): Payer: 59 | Admitting: Cardiovascular Disease

## 2017-09-16 ENCOUNTER — Encounter: Payer: Self-pay | Admitting: Internal Medicine

## 2017-09-16 ENCOUNTER — Encounter

## 2017-09-16 VITALS — BP 119/82 | HR 74 | Ht 60.0 in | Wt 241.8 lb

## 2017-09-16 DIAGNOSIS — R059 Cough, unspecified: Secondary | ICD-10-CM | POA: Insufficient documentation

## 2017-09-16 DIAGNOSIS — E119 Type 2 diabetes mellitus without complications: Secondary | ICD-10-CM

## 2017-09-16 DIAGNOSIS — E7800 Pure hypercholesterolemia, unspecified: Secondary | ICD-10-CM

## 2017-09-16 DIAGNOSIS — R05 Cough: Secondary | ICD-10-CM | POA: Insufficient documentation

## 2017-09-16 DIAGNOSIS — I10 Essential (primary) hypertension: Secondary | ICD-10-CM

## 2017-09-16 DIAGNOSIS — R600 Localized edema: Secondary | ICD-10-CM | POA: Diagnosis not present

## 2017-09-16 DIAGNOSIS — I5189 Other ill-defined heart diseases: Secondary | ICD-10-CM | POA: Diagnosis not present

## 2017-09-16 MED ORDER — LOSARTAN POTASSIUM 50 MG PO TABS: 50.0000 mg | ORAL_TABLET | Freq: Every day | ORAL | 6 refills | Status: DC

## 2017-09-16 NOTE — Patient Instructions (Addendum)
Consider sleep study Labs through primary care Stay on lasix for now  Medication Instructions:   No medication changes made  If you feel like switching the lisinopril We could call in Losartan  Labwork:  No new labs needed  Testing/Procedures:  No further testing at this time   Follow-Up: It was a pleasure seeing you in the office today. Please call us if you have new issues that need to be addressed before your next appt.  [phone]  Your physician wants you to follow-up in:  As needed  If you need a refill on your cardiac medications before your next appointment, please call your pharmacy.  For educational health videos Log in to : [email or web address] Or : [email or web address], password : [email or web address]

## 2017-09-17 ENCOUNTER — Encounter: Payer: Self-pay | Admitting: Cardiovascular Disease

## 2017-09-26 ENCOUNTER — Other Ambulatory Visit: Payer: Self-pay | Admitting: *Deleted

## 2017-09-26 ENCOUNTER — Telehealth: Payer: Self-pay | Admitting: Cardiovascular Disease

## 2017-09-26 DIAGNOSIS — I10 Essential (primary) hypertension: Secondary | ICD-10-CM

## 2017-09-26 NOTE — Telephone Encounter (Signed)
Lmov to schedule BMP

## 2017-10-02 NOTE — Telephone Encounter (Signed)
Lmov to schedule BMP

## 2017-10-07 ENCOUNTER — Encounter: Payer: Self-pay | Admitting: Cardiovascular Disease

## 2017-10-07 NOTE — Telephone Encounter (Signed)
lmov to schedule appt .  Mailed Letter.

## 2017-10-07 NOTE — Telephone Encounter (Signed)
lmov to schedule  °

## 2017-10-09 ENCOUNTER — Encounter: Payer: Self-pay | Admitting: Cardiovascular Disease

## 2017-10-15 ENCOUNTER — Other Ambulatory Visit: Payer: Self-pay | Admitting: Internal Medicine

## 2017-10-22 NOTE — Telephone Encounter (Signed)
CPE overdue letter mailed

## 2017-11-21 LAB — HEMOGLOBIN A1C: Hgb A1c MFr Bld: 5.9 (ref 4.0–6.0)

## 2017-11-26 NOTE — Telephone Encounter (Signed)
Patient calling States she had [facility] re-fax lab results a few days ago.  Would like to know if Pam has received them. Please call to discuss

## 2017-12-03 ENCOUNTER — Telehealth: Payer: Self-pay | Admitting: Cardiovascular Disease

## 2017-12-03 DIAGNOSIS — R0602 Shortness of breath: Secondary | ICD-10-CM

## 2017-12-03 NOTE — Telephone Encounter (Signed)
Pt c/o swelling: STAT is pt has developed SOB within 24 hours  1) How much weight have you gained and in what time span? Not weighing herself says she was told she doesn't need to by cardiologist   2) If swelling, where is the swelling located? Edema everywhere feeling swelling in feet and legs   3) Are you currently taking a fluid pill? Yes   4) Are you currently SOB? Increasing sob   5) Do you have a log of your daily weights (if so, list)? No  6) Have you gained 3 pounds in a day or 5 pounds in a week? Unknown   7) Have you traveled recently? No   Sleeping in a recliner due to sob unable to lay flat in bed

## 2017-12-04 NOTE — Telephone Encounter (Signed)
Spoke with patient who has noticed some recent SOB and swelling in her extremities.  She is much relieved today, but I recommended that she weigh herself daily to check if she's retaining fluids.  She verbalized understanding and will keep [place] updated.

## 2017-12-07 NOTE — Telephone Encounter (Signed)
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

## 2017-12-08 NOTE — Telephone Encounter (Signed)
I spoke with the patient. She is aware of Dr. Donivan Scull recommendations.  She states she did weigh over the weekend and Friday & Saturday morning her weights were stable. Sunday morning her weight was up 3 lbs. She did not weigh today. She is currently taking lasix 20 mg once daily.  I have advised her to try an extra lasix today to see if this will help with her symptoms at all. She is aware to continue to monitor her weights daily and call for a weight gain of 3 lbs or more in 24 hours/ 5 lbs in 1 week. I have advised her to wear compression hose for long period of sitting.   She reports her SOB is unchanged from when she saw Dr. Rockey Situ in May. She states he had mentioned at that time if her labs were ok he would refer her to pulmonary. I advised I would review with Dr. Rockey Situ to see if he is ok with [place] referring her to pulmonary for her SOB. She is aware that if he is ok with this I will place the referral and scheduling will call her to arrange. The patient is agreeable and voices understanding of the above.

## 2017-12-10 NOTE — Telephone Encounter (Signed)
Okay to place referral to pulmonary

## 2017-12-11 NOTE — Telephone Encounter (Signed)
Spoke with patient and reviewed that Dr. Rockey Situ placed referral for pulmonary evaluation. Advised that I would have someone from scheduling call her to get this set up. She was appreciative for the call back and had no further questions at this time.

## 2017-12-12 NOTE — Telephone Encounter (Signed)
8/14 [place]

## 2017-12-24 ENCOUNTER — Ambulatory Visit (INDEPENDENT_AMBULATORY_CARE_PROVIDER_SITE_OTHER): Payer: 59 | Admitting: Internal Medicine

## 2017-12-24 ENCOUNTER — Encounter: Payer: Self-pay | Admitting: Internal Medicine

## 2017-12-24 VITALS — BP 128/86 | HR 74 | Ht 60.0 in | Wt 253.0 lb

## 2017-12-24 DIAGNOSIS — R0600 Dyspnea, unspecified: Secondary | ICD-10-CM | POA: Diagnosis not present

## 2017-12-24 DIAGNOSIS — G4719 Other hypersomnia: Secondary | ICD-10-CM | POA: Diagnosis not present

## 2017-12-24 MED ORDER — ALBUTEROL SULFATE HFA 108 (90 BASE) MCG/ACT IN AERS: 2.0000 | INHALATION_SPRAY | Freq: Four times a day (QID) | RESPIRATORY_TRACT | 2 refills | Status: DC | PRN

## 2017-12-24 NOTE — Patient Instructions (Addendum)
Patient will need sleep study to assess for OSA Referral for Lung cancer referral  Albuterol inhlaer 2-4 puffs every 6 hrs as needed

## 2017-12-24 NOTE — Progress Notes (Signed)
Name: Melissa Cabrera MRN: [id number] DOB: Sep 20, 1955     CONSULTATION DATE: 8.14.19 REFERRING MD : Rockey Situ  CHIEF COMPLAINT: SOB  STUDIES:     06/2017 CXR independently reviewed by Me No acute opacifications No effusions No pneumonia  Echocardiogram 07/24/2017 Left ventricle: The cavity size was normal. Systolic function was   normal. The estimated ejection fraction was in the range of 55%   to 60%. Wall motion was normal; there were no regional wall   motion abnormalities. Features are consistent with a pseudonormal   left ventricular filling pattern, with concomitant abnormal   relaxation and increased filling pressure (grade 2 diastolic   dysfunction).  Pulmonary function testing in office spirometry in the office today revealed No evidence of obstructive or restrictive lung disease Interpretation normal spirometry   HISTORY OF PRESENT ILLNESS: 62 year old female with chronic shortness of breath and intermittent dry cough History of extensive smoking history Referred to [place] by Dr. Rockey Situ with cardiology   Patient has had a dry cough for approximately 1 year has some improvement over the last several months but still persistent, she states that she is exposed mold at home and that's what is causing  Her cough  She has some SOB and DOE Patient does also have GERD and she is on a PPI at this time Patient with recent diagnosis of grade 2 diastolic cardiac dysfunction Has chronic lower extremity swelling and edema and is on Lasix therapy  Patient on chronic methotrexate therapy for psoriasis arthritis  Patient weighs approximately 253 pounds Her weight has increased over the last several months  Patient is a former smoker Approximately 4 packs a day for 30 years Patient is a candidate for lung cancer screening referral    Patient has been having excessive daytime sleepiness Patient has been having extreme fatigue and tiredness, lack of energy +  very Loud snoring  every night + struggling breathe at night and gasps for air   EPWORTH SLEEP SCORE 16  PAST MEDICAL HISTORY :   has a past medical history of Allergy, Arthritis, Cancer ([place]), Chicken pox, Diabetes mellitus without complication ([place]), GERD (gastroesophageal reflux disease), Heart murmur, Hyperlipidemia, Hypertension, Psoriasis, Rheumatic fever, and Rosacea.  has a past surgical history that includes Mouth surgery; Dilation and curettage of uterus (N/A, 09/16/2016); Laparoscopic hysterectomy (Bilateral, 12/04/2016); Anterior and posterior repair (N/A, 12/04/2016); and Abdominal hysterectomy. Prior to Admission medications   Medication Sig Start Date End Date Taking? Authorizing Provider  betamethasone dipropionate (DIPROLENE) 0.05 % ointment Apply 1 application topically 2 (two) times daily as needed. psoriasis     [provider]  calcium carbonate (TUMS EX) 750 MG chewable tablet Chew 750 mg by mouth daily.    [provider]  cetirizine (ZYRTEC) 10 MG tablet Take 10 mg by mouth at bedtime.     [provider]  Cholecalciferol (VITAMIN D3) 400 UNITS CAPS Take 400 Units by mouth daily.     [provider]  clotrimazole-betamethasone (LOTRISONE) cream APPLY TOPICALLY AS DIRECTED TWICE DAILY Patient taking differently: Apply 1 application topically 2 (two) times daily as needed (BACTERIAL INFECTION). APPLY TOPICALLY AS DIRECTED TWICE DAILY 10/29/16   [name], NP  diphenhydrAMINE (BENADRYL) 25 mg capsule Take 50 mg by mouth at bedtime.     [provider]  doxycycline (ORACEA) 40 MG capsule Take 40 mg by mouth every morning.    [provider]  fluocinonide ointment (LIDEX) 1.74 % Apply 1 application topically 2 (two) times daily as  needed (PSORIASIS BEHIND EARS).     [provider]  fluticasone (FLONASE) 50 MCG/ACT nasal spray USE 1 SPRAY INTO BOTH  NOSTRILS AT BEDTIME. 07/04/17   [name], NP  folic acid (FOLVITE) 1 MG tablet  Take 1 mg by mouth daily.    [provider]  furosemide (LASIX) 20 MG tablet TAKE 1 TABLET BY MOUTH  DAILY 10/16/17   [name], NP  glipiZIDE (GLUCOTROL XL) 2.5 MG 24 hr tablet TAKE 1 TABLET BY MOUTH  DAILY WITH BREAKFAST 07/04/17   [name], NP  ketoconazole (NIZORAL) 2 % shampoo Apply 1 application topically 2 (two) times a week.    [provider]  losartan (COZAAR) 50 MG tablet Take 1 tablet (50 mg total) by mouth daily. 09/16/17   Minna Merritts, MD  methotrexate (RHEUMATREX) 2.5 MG tablet Take 15 mg by mouth every Friday. Caution:Chemotherapy. Protect from light.     [provider]  naproxen (NAPROSYN) 500 MG tablet TAKE 1 TABLET BY MOUTH  TWICE DAILY WITH A MEAL 07/04/17   [name], NP  nystatin ointment (MYCOSTATIN) Apply 1 application topically 2 (two) times daily.    [provider]  Omega-3 Fatty Acids (FISH OIL PO) Take 1 capsule by mouth 2 (two) times daily. 1040 mg of fish oil in each capsule    [provider]  omeprazole (PRILOSEC) 40 MG capsule TAKE 1 CAPSULE DAILY 10/16/17   [name], NP  simvastatin (ZOCOR) 10 MG tablet TAKE 1 TABLET BY MOUTH  DAILY 07/04/17   [name], NP  SOOLANTRA 1 % CREA APPLY TO THE FACE EVERY DAY 01/06/17   [provider]  vitamin C (ASCORBIC ACID) 500 MG tablet Take 500 mg by mouth daily.    [provider]   No Known Allergies  FAMILY HISTORY:  family history includes Cancer in her mother and sister; Dementia in her mother; Skin cancer in her brother; Stroke in her maternal grandmother. SOCIAL HISTORY:  reports that she quit smoking about 22 years ago. Her smoking use included cigarettes. She has a 64.00 pack-year smoking history. She has never used smokeless tobacco. She reports that she does not drink alcohol or use drugs.  REVIEW OF SYSTEMS:   Constitutional: Negative for fever, chills, weight loss, malaise/fatigue and diaphoresis.  HENT: Negative for  hearing loss, ear pain, nosebleeds, congestion, sore throat, neck pain, tinnitus and ear discharge.   Eyes: Negative for blurred vision, double vision, photophobia, pain, discharge and redness.  Respiratory: + Cough,-hemoptysis,-sputum production, + shortness of breath, + wheezing and-stridor.   Cardiovascular: Negative for chest pain, palpitations, orthopnea, claudication, leg swelling and PND.  Gastrointestinal: Negative for heartburn, nausea, vomiting, abdominal pain, diarrhea, constipation, blood in stool and melena.  Genitourinary: Negative for dysuria, urgency, frequency, hematuria and flank pain.  Musculoskeletal: Negative for myalgias, back pain, joint pain and falls.  Skin: Negative for itching and rash.  Neurological: Negative for dizziness, tingling, tremors, sensory change, speech change, focal weakness, seizures, loss of consciousness, weakness and headaches.  Endo/Heme/Allergies: Negative for environmental allergies and polydipsia. Does not bruise/bleed easily.  ALL OTHER ROS ARE NEGATIVE  BP 128/86 (BP Location: Left Arm, Cuff Size: Normal)   Pulse 74   Ht 5' (1.524 m)   Wt 253 lb (114.8 kg)   SpO2 97%   BMI 49.41 kg/m    Physical Examination:   GENERAL:NAD, no fevers, chills, no weakness no fatigue HEAD: Normocephalic, atraumatic.  EYES: Pupils  equal, round, reactive to light. Extraocular muscles intact. No scleral icterus.  MOUTH: Moist mucosal membrane.   EAR, NOSE, THROAT: Clear without exudates. No external lesions.  NECK: Supple. No thyromegaly. No nodules. No JVD.  PULMONARY:CTA B/L no wheezes, no crackles, no rhonchi CARDIOVASCULAR: S1 and S2. Regular rate and rhythm. No murmurs, rubs, or gallops. No edema.  GASTROINTESTINAL: Soft, nontender, nondistended. No masses. Positive bowel sounds.  MUSCULOSKELETAL: No swelling, clubbing, or edema. Range of motion full in all extremities.  NEUROLOGIC: Cranial nerves II through XII are intact. No gross focal  neurological deficits.  SKIN: No ulceration, lesions, rashes, or cyanosis. Skin warm and dry. Turgor intact.  PSYCHIATRIC: Mood, affect within normal limits. The patient is awake, alert and oriented x 3. Insight, judgment intact.      ASSESSMENT / PLAN: 62 year old pleasant white female morbidly obese with intermittent dry cough along with intermittent wheezing most likely related to environmental exposure to mold in the setting of deconditioned state GERD and grade 2 diastolic dysfunction with signs and symptoms of underlying sleep apnea  #1 intermittent wheezing and dry cough Most likely related to environmental exposure Recommend avoiding exposures to mold Start albuterol 2 to 4 puffs every 6 hours as needed for cough and wheezing Will provide education in office  #2 grade 2 diastolic cardiac dysfunction Continue Lasix as tolerated Follow-up with cardiology as scheduled  #3 excessive daytime sleepiness and signs and symptoms of OSA Patient will need sleep study as soon as possible  #4 extensive smoking history Patient is a candidate for lung cancer screening referral  #5 GERD Continue PPI  #6Obesity -recommend significant weight loss -recommend changing diet  #7Deconditioned state -Recommend increased daily activity and exercise     Patient  satisfied with Plan of action and management. All questions answered Follow-up in 3 months after sleep study obtained   Corrin Parker, M.D.  [facility]  Medical Director [place] Director [facility] Cardio-Pulmonary Department

## 2017-12-25 ENCOUNTER — Telehealth: Payer: Self-pay | Admitting: *Deleted

## 2017-12-25 NOTE — Telephone Encounter (Signed)
Contacted patient related to lung cancer screening program.  Patient informed me that she had quit smoking in 1997 which rules her out for the low dose CT screening program at this time.  Referring physician will be notified.  Patient informed, voiced understanding.

## 2017-12-26 ENCOUNTER — Other Ambulatory Visit: Payer: Self-pay | Admitting: Internal Medicine

## 2018-01-14 ENCOUNTER — Other Ambulatory Visit: Payer: Self-pay | Admitting: Internal Medicine

## 2018-01-18 ENCOUNTER — Encounter: Payer: Self-pay | Admitting: Internal Medicine

## 2018-01-18 DIAGNOSIS — G4719 Other hypersomnia: Secondary | ICD-10-CM

## 2018-01-19 ENCOUNTER — Other Ambulatory Visit: Payer: Self-pay | Admitting: Internal Medicine

## 2018-01-19 DIAGNOSIS — G4733 Obstructive sleep apnea (adult) (pediatric): Secondary | ICD-10-CM

## 2018-01-20 ENCOUNTER — Telehealth: Payer: Self-pay | Admitting: *Deleted

## 2018-01-20 DIAGNOSIS — G4733 Obstructive sleep apnea (adult) (pediatric): Secondary | ICD-10-CM

## 2018-01-20 NOTE — Telephone Encounter (Signed)
Pt aware of results. Orders placed  Nothing further needed. 

## 2018-03-09 ENCOUNTER — Other Ambulatory Visit: Payer: Self-pay | Admitting: Internal Medicine

## 2018-03-17 ENCOUNTER — Ambulatory Visit (INDEPENDENT_AMBULATORY_CARE_PROVIDER_SITE_OTHER): Payer: 59 | Admitting: Internal Medicine

## 2018-03-17 ENCOUNTER — Encounter: Payer: Self-pay | Admitting: Internal Medicine

## 2018-03-17 ENCOUNTER — Ambulatory Visit (INDEPENDENT_AMBULATORY_CARE_PROVIDER_SITE_OTHER)
Admission: RE | Admit: 2018-03-17 | Discharge: 2018-03-17 | Disposition: A | Discharge status: Home / Self Care | Payer: 59 | Source: Ambulatory Visit | Attending: Internal Medicine | Admitting: Internal Medicine

## 2018-03-17 VITALS — BP 128/84 | HR 63 | Temp 98.2°F | Ht 61.0 in | Wt 253.0 lb

## 2018-03-17 DIAGNOSIS — M79671 Pain in right foot: Secondary | ICD-10-CM

## 2018-03-17 DIAGNOSIS — R208 Other disturbances of skin sensation: Secondary | ICD-10-CM

## 2018-03-17 DIAGNOSIS — M5441 Lumbago with sciatica, right side: Secondary | ICD-10-CM | POA: Diagnosis not present

## 2018-03-17 DIAGNOSIS — I10 Essential (primary) hypertension: Secondary | ICD-10-CM

## 2018-03-17 DIAGNOSIS — E119 Type 2 diabetes mellitus without complications: Secondary | ICD-10-CM

## 2018-03-17 DIAGNOSIS — Z1239 Encounter for other screening for malignant neoplasm of breast: Secondary | ICD-10-CM | POA: Diagnosis not present

## 2018-03-17 DIAGNOSIS — L4050 Arthropathic psoriasis, unspecified: Secondary | ICD-10-CM

## 2018-03-17 DIAGNOSIS — C541 Malignant neoplasm of endometrium: Secondary | ICD-10-CM

## 2018-03-17 DIAGNOSIS — Z0001 Encounter for general adult medical examination with abnormal findings: Secondary | ICD-10-CM | POA: Diagnosis not present

## 2018-03-17 DIAGNOSIS — M5442 Lumbago with sciatica, left side: Secondary | ICD-10-CM

## 2018-03-17 DIAGNOSIS — K219 Gastro-esophageal reflux disease without esophagitis: Secondary | ICD-10-CM

## 2018-03-17 DIAGNOSIS — Z780 Asymptomatic menopausal state: Secondary | ICD-10-CM | POA: Diagnosis not present

## 2018-03-17 DIAGNOSIS — G8929 Other chronic pain: Secondary | ICD-10-CM

## 2018-03-17 DIAGNOSIS — L719 Rosacea, unspecified: Secondary | ICD-10-CM

## 2018-03-17 DIAGNOSIS — E7800 Pure hypercholesterolemia, unspecified: Secondary | ICD-10-CM

## 2018-03-17 DIAGNOSIS — I5189 Other ill-defined heart diseases: Secondary | ICD-10-CM

## 2018-03-17 MED ORDER — GABAPENTIN 100 MG PO CAPS: 100.0000 mg | ORAL_CAPSULE | Freq: Three times a day (TID) | ORAL | 1 refills | Status: DC

## 2018-03-17 NOTE — Patient Instructions (Signed)
Health Maintenance for Postmenopausal Women Menopause is a normal process in which your reproductive ability comes to an end. This process happens gradually over a span of months to years, usually between the ages of 22 and 9. Menopause is complete when you have missed 12 consecutive menstrual periods. It is important to talk with your health care provider about some of the most common conditions that affect postmenopausal women, such as heart disease, cancer, and bone loss (osteoporosis). Adopting a healthy lifestyle and getting preventive care can help to promote your health and wellness. Those actions can also lower your chances of developing some of these common conditions. What should I know about menopause? During menopause, you may experience a number of symptoms, such as:  Moderate-to-severe hot flashes.  Night sweats.  Decrease in sex drive.  Mood swings.  Headaches.  Tiredness.  Irritability.  Memory problems.  Insomnia.  Choosing to treat or not to treat menopausal changes is an individual decision that you make with your health care provider. What should I know about hormone replacement therapy and supplements? Hormone therapy products are effective for treating symptoms that are associated with menopause, such as hot flashes and night sweats. Hormone replacement carries certain risks, especially as you become older. If you are thinking about using estrogen or estrogen with progestin treatments, discuss the benefits and risks with your health care provider. What should I know about heart disease and stroke? Heart disease, heart attack, and stroke become more likely as you age. This may be due, in part, to the hormonal changes that your body experiences during menopause. These can affect how your body processes dietary fats, triglycerides, and cholesterol. Heart attack and stroke are both medical emergencies. There are many things that you can do to help prevent heart disease  and stroke:  Have your blood pressure checked at least every 1-2 years. High blood pressure causes heart disease and increases the risk of stroke.  If you are 53-22 years old, ask your health care provider if you should take aspirin to prevent a heart attack or a stroke.  Do not use any tobacco products, including cigarettes, chewing tobacco, or electronic cigarettes. If you need help quitting, ask your health care provider.  It is important to eat a healthy diet and maintain a healthy weight. ? Be sure to include plenty of vegetables, fruits, low-fat dairy products, and lean protein. ? Avoid eating foods that are high in solid fats, added sugars, or salt (sodium).  Get regular exercise. This is one of the most important things that you can do for your health. ? Try to exercise for at least 150 minutes each week. The type of exercise that you do should increase your heart rate and make you sweat. This is known as moderate-intensity exercise. ? Try to do strengthening exercises at least twice each week. Do these in addition to the moderate-intensity exercise.  Know your numbers.Ask your health care provider to check your cholesterol and your blood glucose. Continue to have your blood tested as directed by your health care provider.  What should I know about cancer screening? There are several types of cancer. Take the following steps to reduce your risk and to catch any cancer development as early as possible. Breast Cancer  Practice breast self-awareness. ? This means understanding how your breasts normally appear and feel. ? It also means doing regular breast self-exams. Let your health care provider know about any changes, no matter how small.  If you are 40  or older, have a clinician do a breast exam (clinical breast exam or CBE) every year. Depending on your age, family history, and medical history, it may be recommended that you also have a yearly breast X-ray (mammogram).  If you  have a family history of breast cancer, talk with your health care provider about genetic screening.  If you are at high risk for breast cancer, talk with your health care provider about having an MRI and a mammogram every year.  Breast cancer (BRCA) gene test is recommended for women who have family members with BRCA-related cancers. Results of the assessment will determine the need for genetic counseling and BRCA1 and for BRCA2 testing. BRCA-related cancers include these types: ? Breast. This occurs in males or females. ? Ovarian. ? Tubal. This may also be called fallopian tube cancer. ? Cancer of the abdominal or pelvic lining (peritoneal cancer). ? Prostate. ? Pancreatic.  Cervical, Uterine, and Ovarian Cancer Your health care provider may recommend that you be screened regularly for cancer of the pelvic organs. These include your ovaries, uterus, and vagina. This screening involves a pelvic exam, which includes checking for microscopic changes to the surface of your cervix (Pap test).  For women ages 21-65, health care providers may recommend a pelvic exam and a Pap test every three years. For women ages 79-65, they may recommend the Pap test and pelvic exam, combined with testing for human papilloma virus (HPV), every five years. Some types of HPV increase your risk of cervical cancer. Testing for HPV may also be done on women of any age who have unclear Pap test results.  Other health care providers may not recommend any screening for nonpregnant women who are considered low risk for pelvic cancer and have no symptoms. Ask your health care provider if a screening pelvic exam is right for you.  If you have had past treatment for cervical cancer or a condition that could lead to cancer, you need Pap tests and screening for cancer for at least 20 years after your treatment. If Pap tests have been discontinued for you, your risk factors (such as having a new sexual partner) need to be  reassessed to determine if you should start having screenings again. Some women have medical problems that increase the chance of getting cervical cancer. In these cases, your health care provider may recommend that you have screening and Pap tests more often.  If you have a family history of uterine cancer or ovarian cancer, talk with your health care provider about genetic screening.  If you have vaginal bleeding after reaching menopause, tell your health care provider.  There are currently no reliable tests available to screen for ovarian cancer.  Lung Cancer Lung cancer screening is recommended for adults 69-62 years old who are at high risk for lung cancer because of a history of smoking. A yearly low-dose CT scan of the lungs is recommended if you:  Currently smoke.  Have a history of at least 30 pack-years of smoking and you currently smoke or have quit within the past 15 years. A pack-year is smoking an average of one pack of cigarettes per day for one year.  Yearly screening should:  Continue until it has been 15 years since you quit.  Stop if you develop a health problem that would prevent you from having lung cancer treatment.  Colorectal Cancer  This type of cancer can be detected and can often be prevented.  Routine colorectal cancer screening usually begins at  age 42 and continues through age 45.  If you have risk factors for colon cancer, your health care provider may recommend that you be screened at an earlier age.  If you have a family history of colorectal cancer, talk with your health care provider about genetic screening.  Your health care provider may also recommend using home test kits to check for hidden blood in your stool.  A small camera at the end of a tube can be used to examine your colon directly (sigmoidoscopy or colonoscopy). This is done to check for the earliest forms of colorectal cancer.  Direct examination of the colon should be repeated every  5-10 years until age 71. However, if early forms of precancerous polyps or small growths are found or if you have a family history or genetic risk for colorectal cancer, you may need to be screened more often.  Skin Cancer  Check your skin from head to toe regularly.  Monitor any moles. Be sure to tell your health care provider: ? About any new moles or changes in moles, especially if there is a change in a mole's shape or color. ? If you have a mole that is larger than the size of a pencil eraser.  If any of your family members has a history of skin cancer, especially at a young age, talk with your health care provider about genetic screening.  Always use sunscreen. Apply sunscreen liberally and repeatedly throughout the day.  Whenever you are outside, protect yourself by wearing long sleeves, pants, a wide-brimmed hat, and sunglasses.  What should I know about osteoporosis? Osteoporosis is a condition in which bone destruction happens more quickly than new bone creation. After menopause, you may be at an increased risk for osteoporosis. To help prevent osteoporosis or the bone fractures that can happen because of osteoporosis, the following is recommended:  If you are 46-71 years old, get at least 1,000 mg of calcium and at least 600 mg of vitamin D per day.  If you are older than age 55 but younger than age 65, get at least 1,200 mg of calcium and at least 600 mg of vitamin D per day.  If you are older than age 54, get at least 1,200 mg of calcium and at least 800 mg of vitamin D per day.  Smoking and excessive alcohol intake increase the risk of osteoporosis. Eat foods that are rich in calcium and vitamin D, and do weight-bearing exercises several times each week as directed by your health care provider. What should I know about how menopause affects my mental health? Depression may occur at any age, but it is more common as you become older. Common symptoms of depression  include:  Low or sad mood.  Changes in sleep patterns.  Changes in appetite or eating patterns.  Feeling an overall lack of motivation or enjoyment of activities that you previously enjoyed.  Frequent crying spells.  Talk with your health care provider if you think that you are experiencing depression. What should I know about immunizations? It is important that you get and maintain your immunizations. These include:  Tetanus, diphtheria, and pertussis (Tdap) booster vaccine.  Influenza every year before the flu season begins.  Pneumonia vaccine.  Shingles vaccine.  Your health care provider may also recommend other immunizations. This information is not intended to replace advice given to you by your health care provider. Make sure you discuss any questions you have with your health care provider. Document Released: 06/21/2005  Document Revised: 11/17/2015 Document Reviewed: 01/31/2015 Elsevier Interactive Patient Education  2018 [employer]  

## 2018-03-17 NOTE — Progress Notes (Signed)
Subjective:    Patient ID: Melissa Cabrera, female    DOB: 15-Jan-1956, 62 y.o.   MRN: [id number]  HPI  Pt presents to the clinic today for her annual exam. She is also due to follow up chronic conditions.  CHF, Diastolic: She reports persistent swelling in her BLE, but denies cough or shortness of breath. She is taking Losartan and Lasix as prescribed. Echo from 07/2017 reviewed. She follows with Dr. Rockey Situ.  Hx of Endometrial Cancer: s/p hysterectomy, 12/2016. She follows with Dr. Amalia Hailey and Dr. Fransisca Connors.  HTN: Her BP today is 124/84. She is taking Losartan and Lasix as prescribed. ECG from 09/2017 reviewed.  GERD: Chronic but stable on Omeprazole. She takes Tums as needed for breakthrough. There is no upper GI on file.  HLD: Her last LDL was 97, 01/2017. She denies myalgias on Simvastatin. She does not consume a low fat diet.  Psoriatic Arthritis: She follows with [facility]. She is taking Methotrexate and Voltaren as prescribed.   DM 2: Her last A1C was 6.1%, 05/2017. She is taking Glipizide as prescribed. She is not monitoring her sugars. Her last eye exam was > 2 years ago. She reports burning sensation in her hands and feet. She is not taking anything for neuropathic pain at this time.  Rosacea: She is taking Doxycycline as prescribed. She follows with dermatology.  Flu: never Tetanus: > 10 years ago Pneumovax: never Shingrix: never Mammogram: 2016, [place] Pap Smear: 08/2016, hysterectomy Bone Density: never Colon Screening: never Vision Screening: > 2 years ago Dentist: as needed  Diet: She does eat meat. She consumes fruits and veggies daily. She tries to avoid fried foods. She drinks mostly water.  Review of Systems  Past Medical History:  Diagnosis Date  . Allergy   . Arthritis   . Cancer (HCC)    endometrial  . Chicken pox   . Diabetes mellitus without complication ([place])   . GERD (gastroesophageal reflux disease)   . Heart murmur    DUE TO RHEUMATIC FEVER  PER PT  . Hyperlipidemia   . Hypertension   . Psoriasis   . Rheumatic fever   . Rosacea     Current Outpatient Medications  Medication Sig Dispense Refill  . albuterol (PROVENTIL HFA;VENTOLIN HFA) 108 (90 Base) MCG/ACT inhaler Inhale 2 puffs into the lungs every 6 (six) hours as needed for wheezing or shortness of breath. 1 Inhaler 2  . betamethasone dipropionate (DIPROLENE) 0.05 % ointment Apply 1 application topically 2 (two) times daily as needed. psoriasis     . calcium carbonate (TUMS EX) 750 MG chewable tablet Chew 750 mg by mouth daily.    . cetirizine (ZYRTEC) 10 MG tablet Take 10 mg by mouth at bedtime.     . Cholecalciferol (VITAMIN D3) 400 UNITS CAPS Take 400 Units by mouth daily.     . clotrimazole-betamethasone (LOTRISONE) cream APPLY TOPICALLY AS DIRECTED TWICE DAILY (Patient taking differently: Apply 1 application topically 2 (two) times daily as needed (BACTERIAL INFECTION). APPLY TOPICALLY AS DIRECTED TWICE DAILY) 60 g 5  . diphenhydrAMINE (BENADRYL) 25 mg capsule Take 50 mg by mouth at bedtime.     Marland Kitchen doxycycline (ADOXA) 100 MG tablet Take 50 mg by mouth daily.    . fluocinonide ointment (LIDEX) 6.41 % Apply 1 application topically 2 (two) times daily as needed (PSORIASIS BEHIND EARS).     . fluticasone (FLONASE) 50 MCG/ACT nasal spray USE 1 SPRAY INTO BOTH  NOSTRILS AT BEDTIME. 16 g 11  .  folic acid (FOLVITE) 1 MG tablet Take 1 mg by mouth daily.    . furosemide (LASIX) 20 MG tablet TAKE 1 TABLET BY MOUTH  DAILY. 90 tablet 0  . glipiZIDE (GLUCOTROL XL) 2.5 MG 24 hr tablet TAKE 1 TABLET BY MOUTH  DAILY WITH BREAKFAST 90 tablet 0  . ketoconazole (NIZORAL) 2 % shampoo Apply 1 application topically 2 (two) times a week.    . losartan (COZAAR) 50 MG tablet Take 1 tablet (50 mg total) by mouth daily. 30 tablet 6  . methotrexate (RHEUMATREX) 2.5 MG tablet Take 10 mg by mouth every Friday. Caution:Chemotherapy. Protect from light.     . nystatin ointment (MYCOSTATIN) Apply 1  application topically 2 (two) times daily.    . Omega-3 Fatty Acids (FISH OIL PO) Take 1 capsule by mouth 2 (two) times daily. 1040 mg of fish oil in each capsule    . omeprazole (PRILOSEC) 40 MG capsule TAKE 1 CAPSULE DAILY 90 capsule 0  . simvastatin (ZOCOR) 10 MG tablet TAKE 1 TABLET BY MOUTH  DAILY 90 tablet 0  . SOOLANTRA 1 % CREA APPLY TO THE FACE EVERY DAY  6  . vitamin C (ASCORBIC ACID) 500 MG tablet Take 1,000 mg by mouth daily.     . diclofenac (VOLTAREN) 75 MG EC tablet TAKE 1 TABLET BY MOUTH TWICE DAILY WITH FOOD OR MILK  2   No current facility-administered medications for this visit.     No Known Allergies  Family History  Problem Relation Age of Onset  . Cancer Mother        skin  . Dementia Mother   . Cancer Sister        skin and liver  . Stroke Maternal Grandmother   . Skin cancer Brother   . Diabetes Neg Hx   . Heart disease Neg Hx     Social History   Socioeconomic History  . Marital status: Married    Spouse name: Not on file  . Number of children: Not on file  . Years of education: Not on file  . Highest education level: Not on file  Occupational History  . Not on file  Social Needs  . Financial resource strain: Not on file  . Food insecurity:    Worry: Not on file    Inability: Not on file  . Transportation needs:    Medical: Not on file    Non-medical: Not on file  Tobacco Use  . Smoking status: Former Smoker    Packs/day: 4.00    Years: 16.00    Pack years: 64.00    Types: Cigarettes    Last attempt to quit: 09/12/1995    Years since quitting: 22.5  . Smokeless tobacco: Never Used  . Tobacco comment: quit in 1997  Substance and Sexual Activity  . Alcohol use: No    Alcohol/week: 0.0 standard drinks  . Drug use: No  . Sexual activity: Yes    Birth control/protection: None  Lifestyle  . Physical activity:    Days per week: Not on file    Minutes per session: Not on file  . Stress: Not on file  Relationships  . Social connections:      Talks on phone: Not on file    Gets together: Not on file    Attends religious service: Not on file    Active member of club or organization: Not on file    Attends meetings of clubs or organizations: Not on file  Relationship status: Not on file  . Intimate partner violence:    Fear of current or ex partner: Not on file    Emotionally abused: Not on file    Physically abused: Not on file    Forced sexual activity: Not on file  Other Topics Concern  . Not on file  Social History Narrative  . Not on file     Constitutional: Denies fever, malaise, fatigue, headache or abrupt weight changes.  HEENT: Denies eye pain, eye redness, ear pain, ringing in the ears, wax buildup, runny nose, nasal congestion, bloody nose, or sore throat. Respiratory: Denies difficulty breathing, shortness of breath, cough or sputum production.   Cardiovascular: Pt reports swelling in legs. Denies chest pain, chest tightness, palpitations or swelling in the hands.  Gastrointestinal: Denies abdominal pain, bloating, constipation, diarrhea or blood in the stool.  GU: Denies urgency, frequency, pain with urination, burning sensation, blood in urine, odor or discharge. Musculoskeletal: Pt reports low back pain, right foot pain. Denies decrease in range of motion, difficulty with gait, muscle pain or joint swelling.  Skin: Denies redness, rashes, lesions or ulcercations.  Neurological: Pt reports burning sensation of hands and feet. Denies dizziness, difficulty with memory, difficulty with speech or problems with balance and coordination.  Psych: Denies anxiety, depression, SI/HI.  No other specific complaints in a complete review of systems (except as listed in HPI above).     Objective:   Physical Exam   BP 128/84   Pulse 63   Temp 98.2 F (36.8 C) (Oral)   Wt 253 lb (114.8 kg)   SpO2 98%   BMI 49.41 kg/m  Wt Readings from Last 3 Encounters:  03/17/18 253 lb (114.8 kg)  12/24/17 253 lb (114.8 kg)   09/16/17 241 lb 12 oz (109.7 kg)    General: Appears her stated age, obese, in NAD. Skin: Warm, dry and intact. No ulcerations noted. HEENT: Head: normal shape and size; Eyes: sclera white, no icterus, conjunctiva pink, PERRLA and EOMs intact; Ears: Tm's gray and intact, normal light reflex; Throat/Mouth: Teeth present, mucosa pink and moist, no exudate, lesions or ulcerations noted.  Neck:  Neck supple, trachea midline. No masses, lumps or thyromegaly present.  Cardiovascular: Normal rate and rhythm. S1,S2 noted.  Murmur noted. Trace nonpitting BLE edema. No carotid bruits noted. Pulmonary/Chest: Normal effort and positive vesicular breath sounds. No respiratory distress. No wheezes, rales or ronchi noted.  Abdomen: Soft and nontender. Normal bowel sounds. No distention or masses noted. Liver, spleen and kidneys non palpable. Musculoskeletal: Normal flexion, extension and rotation of the spine. Bony tenderness noted over the spine. Pain with palpation over the 4th metatarsal. Strength 5/5 BUE/BLE. No difficulty with gait.  Neurological: Alert and oriented. Cranial nerves II-XII grossly intact. Coordination normal.  Psychiatric: Mood and affect normal. Behavior is normal. Judgment and thought content normal.     BMET    Component Value Date/Time   NA 142 06/17/2017 1209   NA 143 01/07/2014   K 3.9 06/17/2017 1209   CL 105 06/17/2017 1209   CO2 30 06/17/2017 1209   GLUCOSE 103 (H) 06/17/2017 1209   BUN 21 06/17/2017 1209   CREATININE 0.95 06/17/2017 1209   CALCIUM 9.3 06/17/2017 1209   GFRNONAA >60 12/08/2016 1614   GFRAA >60 12/08/2016 1614    Lipid Panel     Component Value Date/Time   CHOL 181 02/04/2017 1007   TRIG 116.0 02/04/2017 1007   HDL 60.50 02/04/2017 1007   CHOLHDL 3  02/04/2017 1007   VLDL 23.2 02/04/2017 1007   LDLCALC 97 02/04/2017 1007    CBC    Component Value Date/Time   WBC 9.3 12/08/2016 1614   RBC 4.41 12/08/2016 1614   HGB 13.1 12/08/2016 1614    HGB 14.5 08/16/2016 1200   HCT 38.9 12/08/2016 1614   HCT 42.8 08/16/2016 1200   PLT 342 12/08/2016 1614   PLT 297 08/16/2016 1200   MCV 88.3 12/08/2016 1614   MCV 86 08/16/2016 1200   MCH 29.7 12/08/2016 1614   MCHC 33.6 12/08/2016 1614   RDW 14.9 (H) 12/08/2016 1614   RDW 13.5 08/16/2016 1200   LYMPHSABS 2.6 12/08/2016 1614   MONOABS 0.8 12/08/2016 1614   EOSABS 0.2 12/08/2016 1614   BASOSABS 0.0 12/08/2016 1614    Hgb A1C Lab Results  Component Value Date   HGBA1C 6.1 05/16/2017           Assessment & Plan:   Preventative Health Maintenance:  She declines flu, tetanus or pneumovax Mammogram and bone density ordered She does not need pap smears She declines colon cancer screening or Cologuard at this time Encouraged her to consume a balanced diet and exercise regimen Advised her to see an eye doctor and dentis annually Recent labs reviewed, scanned into chart  Right Foot Pain:  Xray right foot today May benefit from referral to podiatry  Low Back Pain:  Xray lumbar spine today Encouraged regular stretching, physical activity, core strengthening and exercise for weight loss  Will follow up after xrays, return precautions discussed Webb Silversmith, NP

## 2018-03-20 MED ORDER — GABAPENTIN 100 MG PO CAPS: 100.0000 mg | ORAL_CAPSULE | Freq: Three times a day (TID) | ORAL | 0 refills | Status: DC

## 2018-03-20 MED ORDER — FUROSEMIDE 20 MG PO TABS: 40.0000 mg | ORAL_TABLET | Freq: Every day | ORAL | 0 refills | Status: DC

## 2018-03-20 NOTE — Addendum Note (Signed)
Addended by: [name] on: 03/20/2018 03:59 PM   Modules accepted: Orders

## 2018-03-24 DIAGNOSIS — L719 Rosacea, unspecified: Secondary | ICD-10-CM | POA: Insufficient documentation

## 2018-03-24 NOTE — Assessment & Plan Note (Signed)
Continue Doxycycline She will continue to follow with dermatology

## 2018-03-24 NOTE — Assessment & Plan Note (Signed)
Reasonable control on Losartan and Lasix Reinforced DASH diet and exercise for weight loss

## 2018-03-24 NOTE — Assessment & Plan Note (Signed)
Encouraged her to consume a low fat diet Continue Simvastatin for now

## 2018-03-24 NOTE — Assessment & Plan Note (Signed)
Continue MTX, Diclofenac She will continue to follow with rheumatology

## 2018-03-24 NOTE — Assessment & Plan Note (Signed)
In remission She will continue to follow with GYN and oncology

## 2018-03-24 NOTE — Assessment & Plan Note (Addendum)
A1C today No microalbumin secondary to ARB therapy Encouraged her to consume a low carb diet and exercise for weight loss Continue Glipizide Encouraged yearly eye exam, offered referral, she declines Foot exam today She declines flu or pneumovax Will start Gabapentin for neuropathic pain

## 2018-03-24 NOTE — Assessment & Plan Note (Signed)
Stable on Losartan and Lasix Encouraged DASH diet, exercise for weight loss

## 2018-03-24 NOTE — Assessment & Plan Note (Signed)
Continue Omeprazole Discussed how avoiding triggers and weight loss could help improve reflux

## 2018-03-27 ENCOUNTER — Other Ambulatory Visit: Payer: Self-pay | Admitting: Internal Medicine

## 2018-03-29 ENCOUNTER — Encounter: Payer: Self-pay | Admitting: Internal Medicine

## 2018-03-29 DIAGNOSIS — G8929 Other chronic pain: Secondary | ICD-10-CM

## 2018-03-29 DIAGNOSIS — M545 Low back pain: Principal | ICD-10-CM

## 2018-03-30 ENCOUNTER — Ambulatory Visit (INDEPENDENT_AMBULATORY_CARE_PROVIDER_SITE_OTHER): Payer: 59 | Admitting: Internal Medicine

## 2018-03-30 ENCOUNTER — Encounter: Payer: Self-pay | Admitting: Internal Medicine

## 2018-03-30 VITALS — BP 140/90 | HR 81 | Ht 61.0 in | Wt 254.4 lb

## 2018-03-30 DIAGNOSIS — G4733 Obstructive sleep apnea (adult) (pediatric): Secondary | ICD-10-CM | POA: Diagnosis not present

## 2018-03-30 NOTE — Patient Instructions (Signed)
Referral for mask fitting

## 2018-03-30 NOTE — Progress Notes (Signed)
Name: Melissa Cabrera MRN: [id number] DOB: 1955/12/22     CONSULTATION DATE: 8.14.19 REFERRING MD : Rockey Situ  CHIEF COMPLAINT: SOB  STUDIES:     06/2017 CXR independently reviewed by Me No acute opacifications No effusions No pneumonia  Echocardiogram 07/24/2017 Left ventricle: The cavity size was normal. Systolic function was   normal. The estimated ejection fraction was in the range of 55%   to 60%. Wall motion was normal; there were no regional wall   motion abnormalities. Features are consistent with a pseudonormal   left ventricular filling pattern, with concomitant abnormal   relaxation and increased filling pressure (grade 2 diastolic   dysfunction).  Pulmonary function testing in office spirometry in the office today revealed No evidence of obstructive or restrictive lung disease Interpretation normal spirometry   HISTORY OF PRESENT ILLNESS: Patient dx with OSA AHI 15 She loves using mask however she has no upper teeth and has a hard time with masks  Sees Dr Rockey Situ for [facility] Tolerating lasix therapy   On MTX for Psoriatic arthritis She is NOT a candidate for lung cancer screening referral as she was told  90% compliance for days 37% compliance for >4 hrs AHI down to 1.7  Patient needs and benefits from  CPAP usage However, she needs mask fitting referral to assess  She really likes nasal pillows but her throat is very dry when she wakes up and she is miserable  She has increased humidity and it has not worked  PAST MEDICAL HISTORY :   has a past medical history of Allergy, Arthritis, Cancer ([place]), Chicken pox, Diabetes mellitus without complication ([place]), GERD (gastroesophageal reflux disease), Heart murmur, Hyperlipidemia, Hypertension, Psoriasis, Rheumatic fever, and Rosacea.  has a past surgical history that includes Mouth surgery; Dilation and curettage of uterus (N/A, 09/16/2016); Laparoscopic hysterectomy (Bilateral, 12/04/2016); Anterior and posterior repair  (N/A, 12/04/2016); and Abdominal hysterectomy. Prior to Admission medications   Medication Sig Start Date End Date Taking? Authorizing Provider  betamethasone dipropionate (DIPROLENE) 0.05 % ointment Apply 1 application topically 2 (two) times daily as needed. psoriasis     [provider]  calcium carbonate (TUMS EX) 750 MG chewable tablet Chew 750 mg by mouth daily.    [provider]  cetirizine (ZYRTEC) 10 MG tablet Take 10 mg by mouth at bedtime.     [provider]  Cholecalciferol (VITAMIN D3) 400 UNITS CAPS Take 400 Units by mouth daily.     [provider]  clotrimazole-betamethasone (LOTRISONE) cream APPLY TOPICALLY AS DIRECTED TWICE DAILY Patient taking differently: Apply 1 application topically 2 (two) times daily as needed (BACTERIAL INFECTION). APPLY TOPICALLY AS DIRECTED TWICE DAILY 10/29/16   [name], NP  diphenhydrAMINE (BENADRYL) 25 mg capsule Take 50 mg by mouth at bedtime.     [provider]  doxycycline (ORACEA) 40 MG capsule Take 40 mg by mouth every morning.    [provider]  fluocinonide ointment (LIDEX) 3.23 % Apply 1 application topically 2 (two) times daily as needed (PSORIASIS BEHIND EARS).     [provider]  fluticasone (FLONASE) 50 MCG/ACT nasal spray USE 1 SPRAY INTO BOTH  NOSTRILS AT BEDTIME. 07/04/17   [name], NP  folic acid (FOLVITE) 1 MG tablet Take 1 mg by mouth daily.    [provider]  furosemide (LASIX) 20 MG tablet TAKE 1 TABLET BY MOUTH  DAILY 10/16/17   [name], NP  glipiZIDE (GLUCOTROL XL) 2.5 MG 24 hr tablet TAKE  1 TABLET BY MOUTH  DAILY WITH BREAKFAST 07/04/17   [name], NP  ketoconazole (NIZORAL) 2 % shampoo Apply 1 application topically 2 (two) times a week.    [provider]  losartan (COZAAR) 50 MG tablet Take 1 tablet (50 mg total) by mouth daily. 09/16/17   Minna Merritts, MD  methotrexate (RHEUMATREX) 2.5 MG tablet Take 15 mg by  mouth every Friday. Caution:Chemotherapy. Protect from light.     [provider]  naproxen (NAPROSYN) 500 MG tablet TAKE 1 TABLET BY MOUTH  TWICE DAILY WITH A MEAL 07/04/17   [name], NP  nystatin ointment (MYCOSTATIN) Apply 1 application topically 2 (two) times daily.    [provider]  Omega-3 Fatty Acids (FISH OIL PO) Take 1 capsule by mouth 2 (two) times daily. 1040 mg of fish oil in each capsule    [provider]  omeprazole (PRILOSEC) 40 MG capsule TAKE 1 CAPSULE DAILY 10/16/17   [name], NP  simvastatin (ZOCOR) 10 MG tablet TAKE 1 TABLET BY MOUTH  DAILY 07/04/17   [name], NP  SOOLANTRA 1 % CREA APPLY TO THE FACE EVERY DAY 01/06/17   [provider]  vitamin C (ASCORBIC ACID) 500 MG tablet Take 500 mg by mouth daily.    [provider]   No Known Allergies    Review of Systems:  Gen:  Denies  fever, sweats, chills weigh loss  HEENT: Denies blurred vision, double vision, ear pain, eye pain, hearing loss, nose bleeds, sore throat Cardiac:  No dizziness, chest pain or heaviness, chest tightness,edema, No JVD Resp:   No cough, -sputum production, -shortness of breath,-wheezing, -hemoptysis,  Gi: Denies swallowing difficulty, stomach pain, nausea or vomiting, diarrhea, constipation, bowel incontinence Gu:  Denies bladder incontinence, burning urine Ext:   Denies Joint pain, stiffness or swelling Skin: Denies  skin rash, easy bruising or bleeding or hives Endoc:  Denies polyuria, polydipsia , polyphagia or weight change Psych:   Denies depression, insomnia or hallucinations  Other:  All other systems negative   BP 140/90 (BP Location: Left Arm, Cuff Size: Normal)   Pulse 81   Ht 5\' 1"  (1.549 m)   Wt 254 lb 6.4 oz (115.4 kg)   SpO2 95%   BMI 48.07 kg/m    Physical Examination:   GENERAL:NAD, no fevers, chills, no weakness no fatigue HEAD: Normocephalic, atraumatic.  EYES: Pupils equal, round, reactive to  light. Extraocular muscles intact. No scleral icterus.  MOUTH: Moist mucosal membrane. Dentition intact. No abscess noted.  EAR, NOSE, THROAT: Clear without exudates. No external lesions.  NECK: Supple. No thyromegaly. No nodules. No JVD.  PULMONARY: CTA B/L no wheezing, rhonchi, crackles CARDIOVASCULAR: S1 and S2. Regular rate and rhythm. No murmurs, rubs, or gallops. No edema. Pedal pulses 2+ bilaterally.  GASTROINTESTINAL: Soft, nontender, nondistended. No masses. Positive bowel sounds. No hepatosplenomegaly.  MUSCULOSKELETAL: No swelling, clubbing, or edema. Range of motion full in all extremities.  NEUROLOGIC: Cranial nerves II through XII are intact. No gross focal neurological deficits. Sensation intact. Reflexes intact.  SKIN: No ulceration, lesions, rashes, or cyanosis. Skin warm and dry. Turgor intact.  PSYCHIATRIC: Mood, affect within normal limits. The patient is awake, alert and oriented x 3. Insight, judgment intact.  ALL OTHER ROS ARE NEGATIVE         ASSESSMENT / PLAN: 62 year old pleasant morbidly obese white female with underlying sleep apnea with AHI of 15 with intermittent reactive airways disease related to  her obesity in the setting of deconditioned state with a grade 2 diastolic dysfunction  Intermittent wheezing and dry cough  most likely related to environmental exposure  recommend avoiding mold Albuterol 2 to 4 puffs every 4 hours as needed    Grade 2 diastolic cardiac dysfunction Continue Lasix as prescribed  follow-up with cardiology as scheduled   Excessive daytime sleepiness related to underlying sleep AHI is 59  with CPAP therapy AHI is down to 1.7 Patient is having a hard time with the mask as she has no teeth Recommend referral to mask fitting services in [place]    Obesity -recommend significant weight loss -recommend changing diet  Deconditioned state -Recommend increased daily activity and exercise      Patient  satisfied with  Plan of action and management. All questions answered Follow up in 6 months  Daiwik [place] Patricia Pesa, M.D.  [facility]  Medical Director [place] Director [facility] Cardio-Pulmonary Department

## 2018-03-31 NOTE — Addendum Note (Signed)
Addended by: [name] on: 03/31/2018 10:29 AM   Modules accepted: Orders

## 2018-04-03 ENCOUNTER — Encounter: Payer: Self-pay | Admitting: Internal Medicine

## 2018-04-06 MED ORDER — FUROSEMIDE 40 MG PO TABS: 40.0000 mg | ORAL_TABLET | Freq: Every day | ORAL | 0 refills | Status: DC

## 2018-04-10 ENCOUNTER — Other Ambulatory Visit: Payer: Self-pay | Admitting: Cardiovascular Disease

## 2018-04-15 ENCOUNTER — Other Ambulatory Visit: Payer: Self-pay | Admitting: Internal Medicine

## 2018-04-18 ENCOUNTER — Encounter: Payer: Self-pay | Admitting: Internal Medicine

## 2018-04-20 ENCOUNTER — Encounter: Payer: Self-pay | Admitting: Internal Medicine

## 2018-04-20 MED ORDER — LOSARTAN POTASSIUM 50 MG PO TABS: 50.0000 mg | ORAL_TABLET | Freq: Every day | ORAL | 2 refills | Status: DC

## 2018-04-20 NOTE — Addendum Note (Signed)
Addended by: Vanessa Ralphs on: 04/20/2018 12:12 PM   Modules accepted: Orders

## 2018-04-21 ENCOUNTER — Ambulatory Visit (INDEPENDENT_AMBULATORY_CARE_PROVIDER_SITE_OTHER): Payer: 59 | Admitting: Podiatry

## 2018-04-21 ENCOUNTER — Encounter: Payer: Self-pay | Admitting: Podiatry

## 2018-04-21 ENCOUNTER — Ambulatory Visit (INDEPENDENT_AMBULATORY_CARE_PROVIDER_SITE_OTHER): Payer: 59

## 2018-04-21 ENCOUNTER — Other Ambulatory Visit: Payer: Self-pay | Admitting: Podiatry

## 2018-04-21 VITALS — BP 135/73 | HR 68

## 2018-04-21 DIAGNOSIS — G609 Hereditary and idiopathic neuropathy, unspecified: Secondary | ICD-10-CM

## 2018-04-21 DIAGNOSIS — L603 Nail dystrophy: Secondary | ICD-10-CM

## 2018-04-21 DIAGNOSIS — M79672 Pain in left foot: Principal | ICD-10-CM

## 2018-04-21 DIAGNOSIS — M79671 Pain in right foot: Secondary | ICD-10-CM

## 2018-04-21 DIAGNOSIS — E0843 Diabetes 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 autonomic (poly)neuropathy: Secondary | ICD-10-CM | POA: Diagnosis not present

## 2018-04-21 MED ORDER — GABAPENTIN 300 MG PO CAPS: 600.0000 mg | ORAL_CAPSULE | Freq: Three times a day (TID) | ORAL | 0 refills | Status: DC

## 2018-04-21 NOTE — Patient Instructions (Signed)
For Psoriatic Nails...  Urea topical nail gel ([employer]) Biotin supplement (good for skin, hair, nails) Topical Vit E oil

## 2018-04-22 ENCOUNTER — Other Ambulatory Visit (HOSPITAL_BASED_OUTPATIENT_CLINIC_OR_DEPARTMENT_OTHER): Payer: 59

## 2018-04-22 MED ORDER — NONFORMULARY OR COMPOUNDED ITEM: 2 refills | Status: DC

## 2018-04-22 NOTE — Progress Notes (Signed)
   Subjective: 62 year old female presenting today as a new patient with a chief complaint of burning pain to the dorsum of the feet bilaterally that began 2-3 months ago. She also reports pain around the right great toenail. She has been taking Gabapentin prescribed by her PCP for treatment. There are no modifying factors noted. Patient is here for further evaluation and treatment.   Past Medical History:  Diagnosis Date  . Allergy   . Arthritis   . Cancer (HCC)    endometrial  . Chicken pox   . Diabetes mellitus without complication ([place])   . GERD (gastroesophageal reflux disease)   . Heart murmur    DUE TO RHEUMATIC FEVER PER PT  . Hyperlipidemia   . Hypertension   . Psoriasis   . Rheumatic fever   . Rosacea     Objective: Physical Exam General: The patient is alert and oriented x3 in no acute distress.  Dermatology: Hyperkeratotic, discolored, thickened, onychodystrophy of nails noted bilaterally. Skin is warm, dry and supple bilateral lower extremities. Negative for open lesions or macerations.  Vascular: Palpable pedal pulses bilaterally. No edema or erythema noted. Capillary refill within normal limits.  Neurological: Epicritic and protective threshold diminished bilaterally.   Musculoskeletal Exam: Range of motion within normal limits to all pedal and ankle joints bilateral. Muscle strength 5/5 in all groups bilateral.  Radiographic Exam:  Normal osseous mineralization. Joint spaces preserved. No fracture/dislocation/boney destruction.     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 X-Rays reviewed.  #2 Continue taking oral Gabapentin as directed by PCP.  #3 Prescription for [facility] Peripheral Neuropathy pain cream provided.  #4 Recommended urea topical nail gel, Biotin supplement and Vitamin E oil for nails.  #5 Return to clinic as needed.     Edrick Kins, DPM [facility]  Dr. Edrick Kins, [address]                                        [place], [place] [address]                Office [phone]  Fax [phone]

## 2018-04-23 ENCOUNTER — Other Ambulatory Visit: Payer: Self-pay | Admitting: Internal Medicine

## 2018-04-23 ENCOUNTER — Ambulatory Visit
Admission: RE | Admit: 2018-04-23 | Discharge: 2018-04-23 | Disposition: A | Discharge status: Home / Self Care | Payer: 59 | Source: Ambulatory Visit | Attending: Internal Medicine | Admitting: Internal Medicine

## 2018-04-23 DIAGNOSIS — M545 Low back pain, unspecified: Secondary | ICD-10-CM

## 2018-04-23 DIAGNOSIS — M5137 Other intervertebral disc degeneration, lumbosacral region: Secondary | ICD-10-CM | POA: Insufficient documentation

## 2018-04-23 DIAGNOSIS — M48061 Spinal stenosis, lumbar region without neurogenic claudication: Secondary | ICD-10-CM | POA: Insufficient documentation

## 2018-04-23 DIAGNOSIS — G8929 Other chronic pain: Secondary | ICD-10-CM | POA: Diagnosis present

## 2018-04-23 DIAGNOSIS — M5136 Other intervertebral disc degeneration, lumbar region: Secondary | ICD-10-CM | POA: Diagnosis not present

## 2018-04-27 ENCOUNTER — Ambulatory Visit (HOSPITAL_BASED_OUTPATIENT_CLINIC_OR_DEPARTMENT_OTHER): Payer: 59 | Attending: Internal Medicine | Admitting: Radiology

## 2018-04-27 DIAGNOSIS — G4733 Obstructive sleep apnea (adult) (pediatric): Secondary | ICD-10-CM

## 2018-05-11 ENCOUNTER — Encounter: Payer: Self-pay | Admitting: Internal Medicine

## 2018-05-15 ENCOUNTER — Encounter: Payer: Self-pay | Admitting: Internal Medicine

## 2018-05-19 ENCOUNTER — Ambulatory Visit
Admission: RE | Admit: 2018-05-19 | Discharge: 2018-05-19 | Disposition: A | Discharge status: Home / Self Care | Payer: 59 | Source: Ambulatory Visit | Attending: Internal Medicine | Admitting: Internal Medicine

## 2018-05-19 DIAGNOSIS — Z1239 Encounter for other screening for malignant neoplasm of breast: Secondary | ICD-10-CM | POA: Diagnosis present

## 2018-05-19 DIAGNOSIS — Z780 Asymptomatic menopausal state: Secondary | ICD-10-CM | POA: Diagnosis not present

## 2018-05-24 ENCOUNTER — Other Ambulatory Visit: Payer: Self-pay | Admitting: Internal Medicine

## 2018-05-25 ENCOUNTER — Encounter: Payer: Self-pay | Admitting: Internal Medicine

## 2018-05-25 ENCOUNTER — Ambulatory Visit (INDEPENDENT_AMBULATORY_CARE_PROVIDER_SITE_OTHER): Payer: 59 | Admitting: Internal Medicine

## 2018-05-25 VITALS — BP 124/76 | HR 76 | Temp 97.9°F | Wt 264.0 lb

## 2018-05-25 DIAGNOSIS — R609 Edema, unspecified: Secondary | ICD-10-CM

## 2018-05-25 MED ORDER — TORSEMIDE 20 MG PO TABS: 40.0000 mg | ORAL_TABLET | Freq: Every day | ORAL | 0 refills | Status: DC

## 2018-05-25 NOTE — Patient Instructions (Signed)

## 2018-05-25 NOTE — Progress Notes (Signed)
Subjective:    Patient ID: Melissa Cabrera, female    DOB: 1955/09/22, 63 y.o.   MRN: [id number]  HPI  Patient presents to the clinic today with complaints of bilateral lower extremity edema.  She reports this has been intermittent over the last 5 to 6 weeks but worse in the last 2 to 3 days.  She reports the swelling is not painful but rather bothersome.  She has not noticed any redness or warmth of the lower extremities.  She has failed HCTZ in the past.  She is currently taking Furosemide 40 mg daily with minimal improvement.  She has some mild shortness of breath but believes this is related to her weight.  She denies chest pain, chest tightness or chronic cough.  She was recently started on Gabapentin which could be a contributing factor.  She had an echocardiogram 07/2017 which did show evidence of grade 2 diastolic dysfunction.  She was referred to cardiology for further evaluation, however she reports they did not address her leg swelling.  Review of Systems      Past Medical History:  Diagnosis Date  . Allergy   . Arthritis   . Cancer (HCC)    endometrial  . Chicken pox   . Diabetes mellitus without complication ([place])   . GERD (gastroesophageal reflux disease)   . Heart murmur    DUE TO RHEUMATIC FEVER PER PT  . Hyperlipidemia   . Hypertension   . Psoriasis   . Rheumatic fever   . Rosacea     Current Outpatient Medications  Medication Sig Dispense Refill  . albuterol (PROVENTIL HFA;VENTOLIN HFA) 108 (90 Base) MCG/ACT inhaler Inhale 2 puffs into the lungs every 6 (six) hours as needed for wheezing or shortness of breath. 1 Inhaler 2  . betamethasone dipropionate (DIPROLENE) 0.05 % ointment Apply 1 application topically 2 (two) times daily as needed. psoriasis     . calcium carbonate (TUMS EX) 750 MG chewable tablet Chew 750 mg by mouth daily.    . cetirizine (ZYRTEC) 10 MG tablet Take 10 mg by mouth at bedtime.     . Cholecalciferol (VITAMIN D3) 400 UNITS CAPS Take 400  Units by mouth daily.     . clotrimazole-betamethasone (LOTRISONE) cream APPLY TOPICALLY AS DIRECTED TWICE DAILY (Patient taking differently: Apply 1 application topically 2 (two) times daily as needed (BACTERIAL INFECTION). APPLY TOPICALLY AS DIRECTED TWICE DAILY) 60 g 5  . diclofenac (VOLTAREN) 75 MG EC tablet TAKE 1 TABLET BY MOUTH TWICE DAILY WITH FOOD OR MILK  2  . diphenhydrAMINE (BENADRYL) 25 mg capsule Take 50 mg by mouth at bedtime.     Marland Kitchen doxycycline (ADOXA) 100 MG tablet Take 50 mg by mouth daily.    . fluocinonide ointment (LIDEX) 0.86 % Apply 1 application topically 2 (two) times daily as needed (PSORIASIS BEHIND EARS).     . fluticasone (FLONASE) 50 MCG/ACT nasal spray USE 1 SPRAY INTO BOTH  NOSTRILS AT BEDTIME. 16 g 11  . folic acid (FOLVITE) 1 MG tablet Take 1 mg by mouth daily.    . furosemide (LASIX) 40 MG tablet Take 1 tablet (40 mg total) by mouth daily. 90 tablet 0  . gabapentin (NEURONTIN) 300 MG capsule Take 2 capsules (600 mg total) by mouth 3 (three) times daily. 270 capsule 0  . glipiZIDE (GLUCOTROL XL) 2.5 MG 24 hr tablet TAKE 1 TABLET BY MOUTH  DAILY WITH BREAKFAST 90 tablet 0  . ketoconazole (NIZORAL) 2 % shampoo Apply 1 application  topically 2 (two) times a week.    . losartan (COZAAR) 50 MG tablet Take 1 tablet (50 mg total) by mouth daily. 90 tablet 2  . methotrexate (RHEUMATREX) 2.5 MG tablet Take 10 mg by mouth every Friday. Caution:Chemotherapy. Protect from light.     . NONFORMULARY OR COMPOUNDED ITEM See pharmacy note 120 each 2  . nystatin ointment (MYCOSTATIN) Apply 1 application topically 2 (two) times daily.    . Omega-3 Fatty Acids (FISH OIL PO) Take 1 capsule by mouth 2 (two) times daily. 1040 mg of fish oil in each capsule    . omeprazole (PRILOSEC) 40 MG capsule TAKE 1 CAPSULE BY MOUTH  DAILY 90 capsule 2  . simvastatin (ZOCOR) 10 MG tablet TAKE 1 TABLET BY MOUTH  DAILY 90 tablet 2  . SOOLANTRA 1 % CREA APPLY TO THE FACE EVERY DAY  6  . vitamin C  (ASCORBIC ACID) 500 MG tablet Take 1,000 mg by mouth daily.      No current facility-administered medications for this visit.     No Known Allergies  Family History  Problem Relation Age of Onset  . Cancer Mother        skin  . Dementia Mother   . Cancer Sister        skin and liver  . Stroke Maternal Grandmother   . Skin cancer Brother   . Diabetes Neg Hx   . Heart disease Neg Hx   . Breast cancer Neg Hx     Social History   Socioeconomic History  . Marital status: Married    Spouse name: Not on file  . Number of children: Not on file  . Years of education: Not on file  . Highest education level: Not on file  Occupational History  . Not on file  Social Needs  . Financial resource strain: Not on file  . Food insecurity:    Worry: Not on file    Inability: Not on file  . Transportation needs:    Medical: Not on file    Non-medical: Not on file  Tobacco Use  . Smoking status: Former Smoker    Packs/day: 4.00    Years: 16.00    Pack years: 64.00    Types: Cigarettes    Last attempt to quit: 09/12/1995    Years since quitting: 22.7  . Smokeless tobacco: Never Used  . Tobacco comment: quit in 1997  Substance and Sexual Activity  . Alcohol use: No    Alcohol/week: 0.0 standard drinks  . Drug use: No  . Sexual activity: Yes    Birth control/protection: None  Lifestyle  . Physical activity:    Days per week: Not on file    Minutes per session: Not on file  . Stress: Not on file  Relationships  . Social connections:    Talks on phone: Not on file    Gets together: Not on file    Attends religious service: Not on file    Active member of club or organization: Not on file    Attends meetings of clubs or organizations: Not on file    Relationship status: Not on file  . Intimate partner violence:    Fear of current or ex partner: Not on file    Emotionally abused: Not on file    Physically abused: Not on file    Forced sexual activity: Not on file  Other  Topics Concern  . Not on file  Social History Narrative  .  Not on file     Constitutional: Patient reports weight gain.  Denies fever, malaise, fatigue, headache.  Respiratory: Patient reports shortness of breath.  Denies difficulty breathing, cough or sputum production.   Cardiovascular: Patient reports bilateral lower extremity edema.  Denies chest pain, chest tightness, palpitations or swelling in the hands.  Skin: Denies redness, rashes, lesions or ulcercations.    No other specific complaints in a complete review of systems (except as listed in HPI above).  Objective:   Physical Exam   BP 124/76   Pulse 76   Temp 97.9 F (36.6 C) (Oral)   Wt 264 lb (119.7 kg)   SpO2 98%   BMI 49.88 kg/m  Wt Readings from Last 3 Encounters:  05/25/18 264 lb (119.7 kg)  03/30/18 254 lb 6.4 oz (115.4 kg)  03/17/18 253 lb (114.8 kg)    General: Appears her stated age, obese, in NAD. Skin: Warm, dry and intact. No redness or warmth noted. Cardiovascular: Normal rate and rhythm. S1,S2 noted.  No murmur, rubs or gallops noted.  2+ pitting edema of BLE noted.  Pedal pulses 2+ bilaterally. Pulmonary/Chest: Normal effort and positive vesicular breath sounds. No respiratory distress. No wheezes, rales or ronchi noted.  Musculoskeletal: No difficulty with gait.  Neurological: Alert and oriented.   BMET    Component Value Date/Time   NA 142 06/17/2017 1209   NA 143 01/07/2014   K 3.9 06/17/2017 1209   CL 105 06/17/2017 1209   CO2 30 06/17/2017 1209   GLUCOSE 103 (H) 06/17/2017 1209   BUN 21 06/17/2017 1209   CREATININE 0.95 06/17/2017 1209   CALCIUM 9.3 06/17/2017 1209   GFRNONAA >60 12/08/2016 1614   GFRAA >60 12/08/2016 1614    Lipid Panel     Component Value Date/Time   CHOL 181 02/04/2017 1007   TRIG 116.0 02/04/2017 1007   HDL 60.50 02/04/2017 1007   CHOLHDL 3 02/04/2017 1007   VLDL 23.2 02/04/2017 1007   LDLCALC 97 02/04/2017 1007    CBC    Component Value Date/Time     WBC 9.3 12/08/2016 1614   RBC 4.41 12/08/2016 1614   HGB 13.1 12/08/2016 1614   HGB 14.5 08/16/2016 1200   HCT 38.9 12/08/2016 1614   HCT 42.8 08/16/2016 1200   PLT 342 12/08/2016 1614   PLT 297 08/16/2016 1200   MCV 88.3 12/08/2016 1614   MCV 86 08/16/2016 1200   MCH 29.7 12/08/2016 1614   MCHC 33.6 12/08/2016 1614   RDW 14.9 (H) 12/08/2016 1614   RDW 13.5 08/16/2016 1200   LYMPHSABS 2.6 12/08/2016 1614   MONOABS 0.8 12/08/2016 1614   EOSABS 0.2 12/08/2016 1614   BASOSABS 0.0 12/08/2016 1614    Hgb A1C Lab Results  Component Value Date   HGBA1C 5.9 11/21/2017           Assessment & Plan:   Peripheral Edema:  Does not appear to be related to congestive heart failure No S3 or rales noted on exam ? chronic venous insufficiency Discussed low-sodium diet Encouraged elevation We will stop Furosemide and try Torsemide 40 mg 1 tab p.o. daily, Rx sent to pharmacy Rx provided for knee-high compression hose If no improvement, consider referral to vascular surgery  Return precautions discussed, advised her to update me via my chart in 2 weeks. Webb Silversmith, NP

## 2018-06-08 ENCOUNTER — Encounter: Payer: Self-pay | Admitting: Internal Medicine

## 2018-06-08 DIAGNOSIS — R609 Edema, unspecified: Secondary | ICD-10-CM

## 2018-06-08 DIAGNOSIS — Z5181 Encounter for therapeutic drug level monitoring: Secondary | ICD-10-CM

## 2018-06-09 MED ORDER — TORSEMIDE 20 MG PO TABS: 40.0000 mg | ORAL_TABLET | Freq: Every day | ORAL | 2 refills | Status: DC

## 2018-06-12 ENCOUNTER — Other Ambulatory Visit: Payer: Self-pay | Admitting: Internal Medicine

## 2018-06-16 ENCOUNTER — Other Ambulatory Visit (INDEPENDENT_AMBULATORY_CARE_PROVIDER_SITE_OTHER): Payer: 59

## 2018-06-16 ENCOUNTER — Ambulatory Visit: Payer: 59 | Admitting: Internal Medicine

## 2018-06-16 DIAGNOSIS — R609 Edema, unspecified: Secondary | ICD-10-CM

## 2018-06-16 DIAGNOSIS — Z5181 Encounter for therapeutic drug level monitoring: Secondary | ICD-10-CM | POA: Diagnosis not present

## 2018-06-16 DIAGNOSIS — N289 Disorder of kidney and ureter, unspecified: Secondary | ICD-10-CM

## 2018-06-16 LAB — BASIC METABOLIC PANEL
BUN: 19 mg/dL (ref 6–23)
CALCIUM: 9.1 mg/dL (ref 8.4–10.5)
CO2: 33 mEq/L — ABNORMAL HIGH (ref 19–32)
CREATININE: 1.05 mg/dL (ref 0.40–1.20)
Chloride: 99 mEq/L (ref 96–112)
GFR: 53.07 mL/min — AB (ref >60.00)
Glucose, Bld: 126 mg/dL — ABNORMAL HIGH (ref 70–99)
Potassium: 3.8 mEq/L (ref 3.5–5.1)
SODIUM: 140 meq/L (ref 135–145)

## 2018-06-17 ENCOUNTER — Encounter: Payer: Self-pay | Admitting: Internal Medicine

## 2018-06-18 NOTE — Addendum Note (Signed)
Addended by: Lurlean Nanny on: 06/18/2018 01:33 PM   Modules accepted: Orders

## 2018-06-19 ENCOUNTER — Other Ambulatory Visit: Payer: Self-pay | Admitting: Internal Medicine

## 2018-06-30 ENCOUNTER — Other Ambulatory Visit (INDEPENDENT_AMBULATORY_CARE_PROVIDER_SITE_OTHER): Payer: 59

## 2018-06-30 ENCOUNTER — Encounter: Payer: Self-pay | Admitting: Internal Medicine

## 2018-06-30 DIAGNOSIS — N289 Disorder of kidney and ureter, unspecified: Secondary | ICD-10-CM | POA: Diagnosis not present

## 2018-06-30 LAB — BASIC METABOLIC PANEL
BUN: 19 mg/dL (ref 6–23)
CHLORIDE: 102 meq/L (ref 96–112)
CO2: 32 mEq/L (ref 19–32)
Calcium: 9.2 mg/dL (ref 8.4–10.5)
Creatinine, Ser: 0.99 mg/dL (ref 0.40–1.20)
GFR: 56.79 mL/min — ABNORMAL LOW (ref >60.00)
Glucose, Bld: 124 mg/dL — ABNORMAL HIGH (ref 70–99)
POTASSIUM: 4.4 meq/L (ref 3.5–5.1)
Sodium: 140 mEq/L (ref 135–145)

## 2018-07-01 ENCOUNTER — Other Ambulatory Visit: Payer: 59

## 2018-07-02 ENCOUNTER — Ambulatory Visit: Payer: 59 | Admitting: Internal Medicine

## 2018-07-06 ENCOUNTER — Encounter: Payer: Self-pay | Admitting: Internal Medicine

## 2018-07-06 MED ORDER — DIAZEPAM 5 MG PO TABS: 5.0000 mg | ORAL_TABLET | Freq: Once | ORAL | 0 refills | Status: AC

## 2018-07-08 ENCOUNTER — Encounter: Payer: Self-pay | Admitting: Internal Medicine

## 2018-07-09 ENCOUNTER — Encounter: Payer: Self-pay | Admitting: Internal Medicine

## 2018-07-10 ENCOUNTER — Other Ambulatory Visit: Payer: Self-pay | Admitting: Internal Medicine

## 2018-07-10 MED ORDER — FUROSEMIDE 20 MG PO TABS: 20.0000 mg | ORAL_TABLET | Freq: Every day | ORAL | 2 refills | Status: DC

## 2018-07-13 ENCOUNTER — Encounter: Payer: Self-pay | Admitting: Internal Medicine

## 2018-07-13 IMAGING — DX DG FOOT COMPLETE 3+V*L*
3 series · 3 of 3 positions shown · non-contrast
Comparison: None.

CLINICAL DATA: Lateral left foot pain after hearing a popping
sound.

EXAM:
LEFT FOOT - COMPLETE 3+ VIEW

[foot ap]
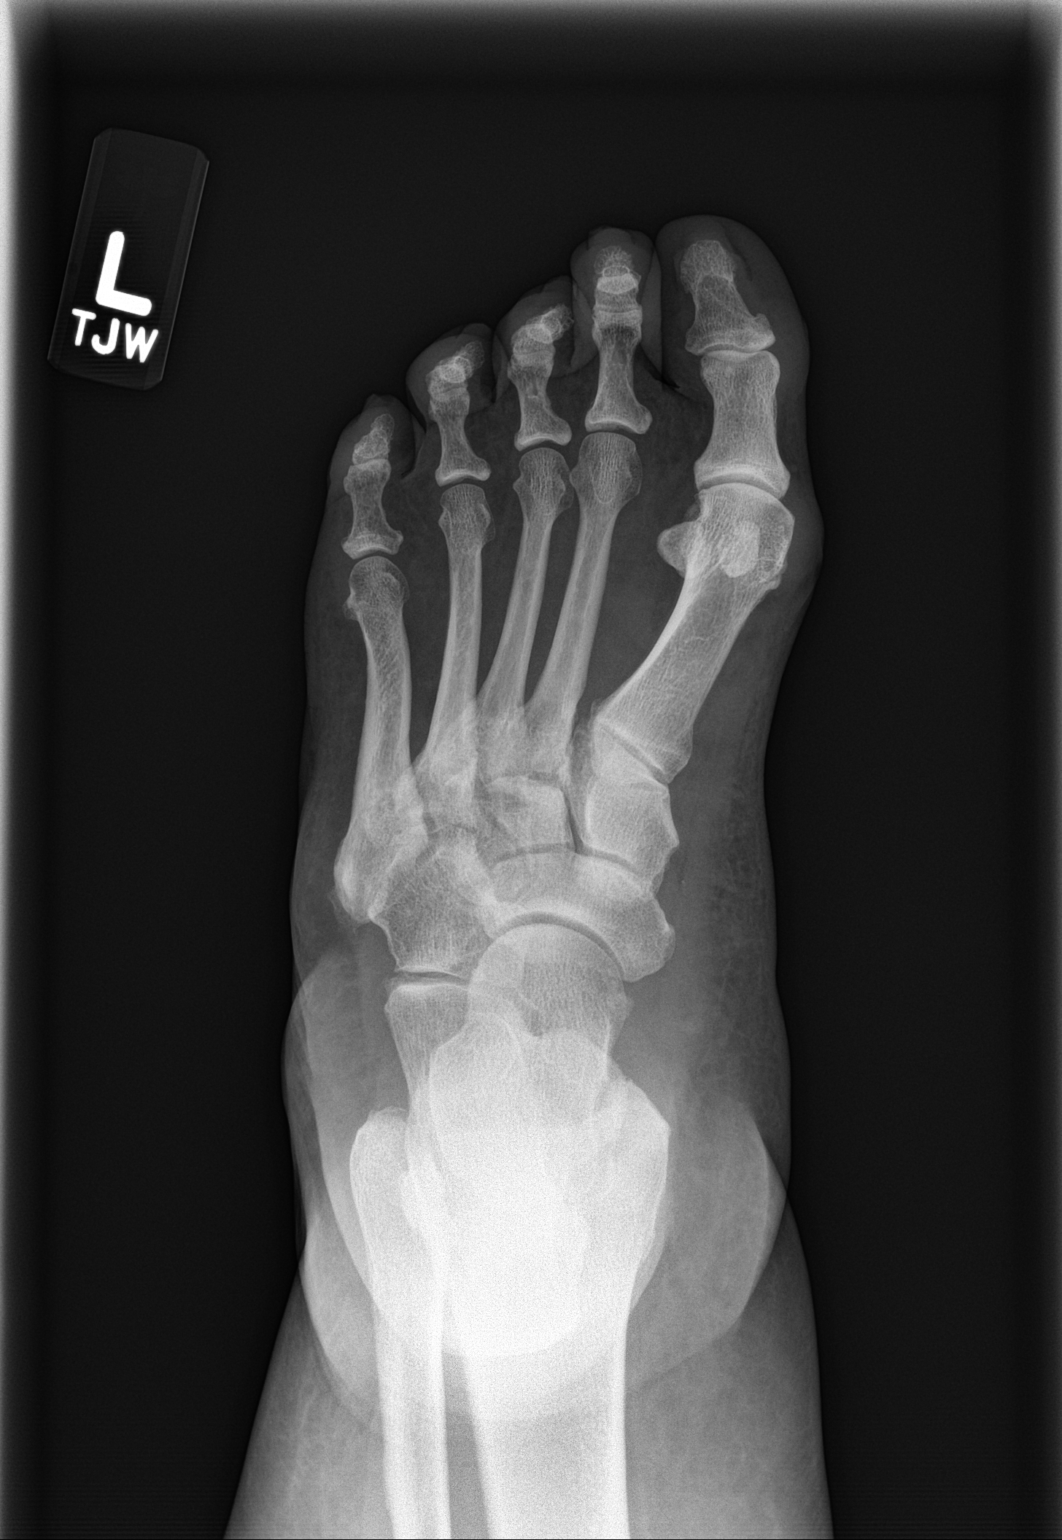

[foot obl]
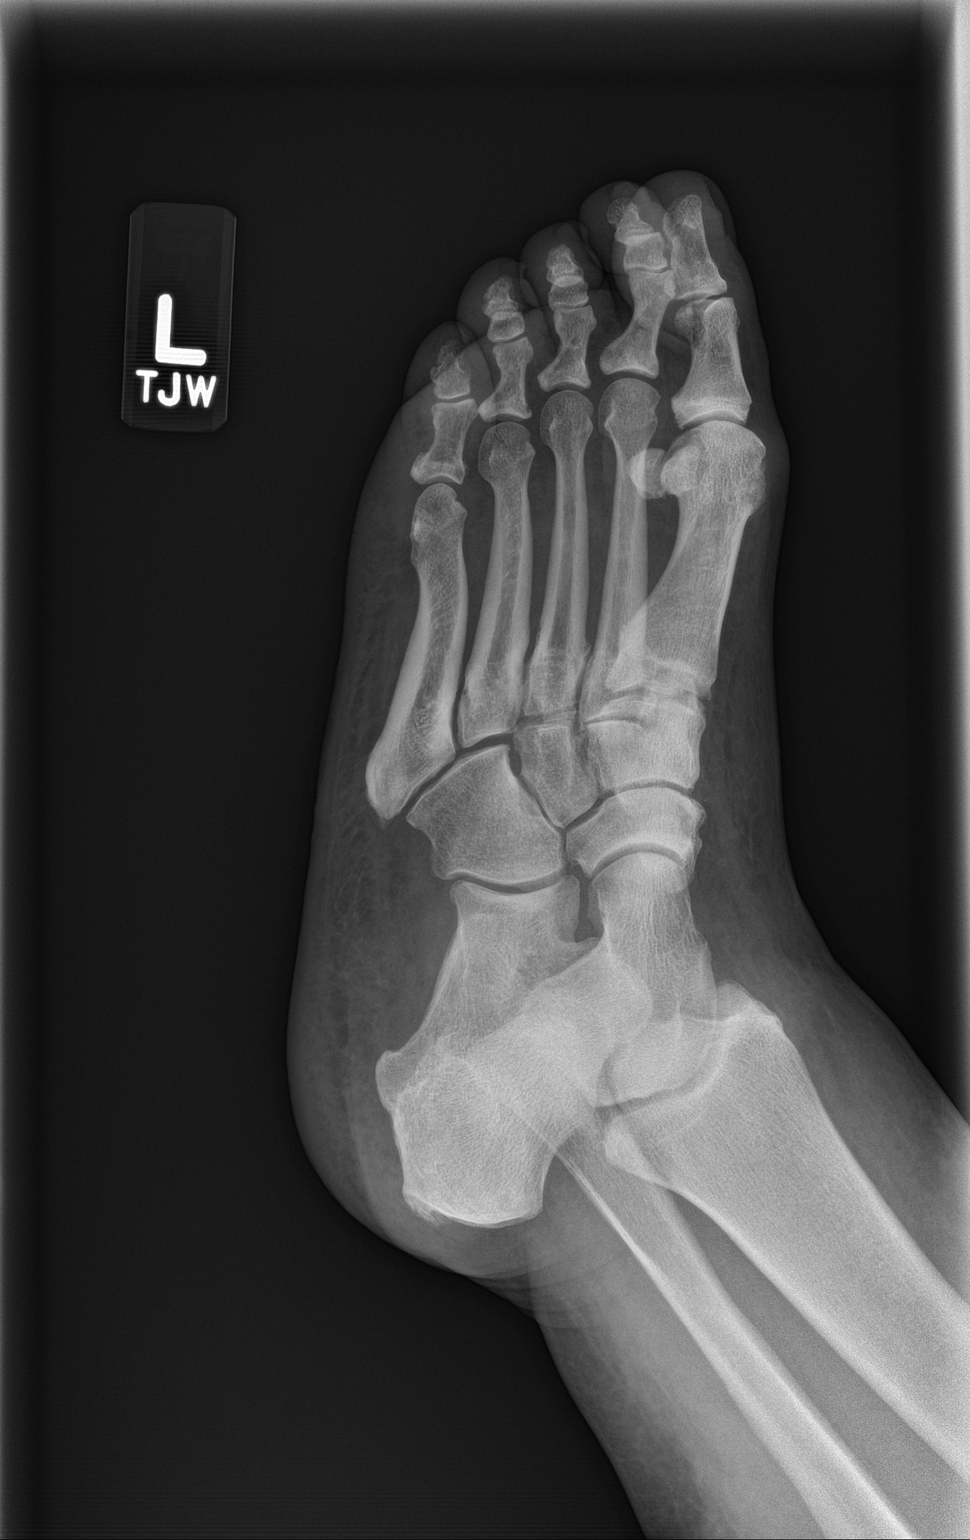

[foot lat]
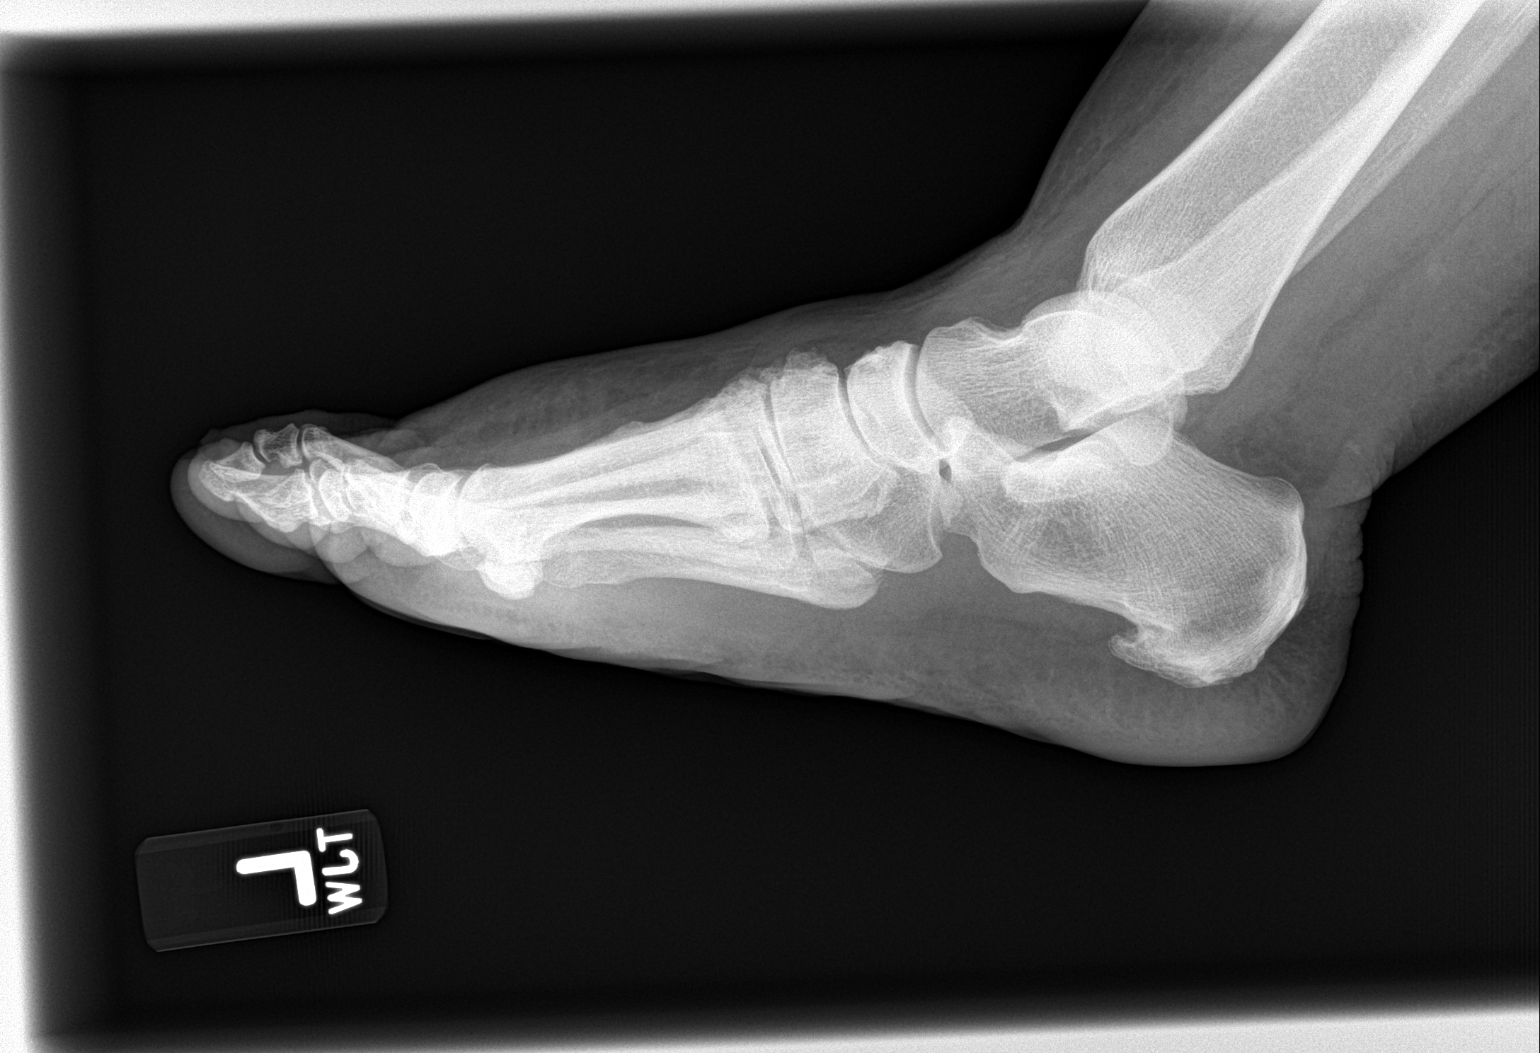

[3 of 3 positions shown; findings below may reference images not displayed]

FINDINGS: Diffuse subcutaneous edema involving the foot, ankle and distal
lower leg. Moderately large inferior calcaneal spur. Mild hallux
valgus and minimal degenerative changes at the first MTP joint. No
fracture or dislocation seen.
IMPRESSION: No fracture. Diffuse subcutaneous edema, mild hallux valgus and
minimal first MTP joint degenerative changes.

## 2018-07-14 ENCOUNTER — Encounter: Payer: Self-pay | Admitting: Internal Medicine

## 2018-07-14 MED ORDER — FUROSEMIDE 40 MG PO TABS: 40.0000 mg | ORAL_TABLET | Freq: Every day | ORAL | 0 refills | Status: DC

## 2018-08-11 ENCOUNTER — Other Ambulatory Visit: Payer: Self-pay | Admitting: Internal Medicine

## 2018-08-20 ENCOUNTER — Other Ambulatory Visit: Payer: Self-pay | Admitting: Internal Medicine

## 2018-08-24 ENCOUNTER — Encounter: Payer: Self-pay | Admitting: Internal Medicine

## 2018-08-27 ENCOUNTER — Encounter: Payer: Self-pay | Admitting: Internal Medicine

## 2018-09-05 ENCOUNTER — Encounter: Payer: Self-pay | Admitting: Internal Medicine

## 2018-09-06 MED ORDER — OMEPRAZOLE 40 MG PO CPDR: 40.0000 mg | DELAYED_RELEASE_CAPSULE | Freq: Every day | ORAL | 0 refills | Status: DC

## 2018-09-16 NOTE — Telephone Encounter (Signed)
Labs have been faxed.

## 2018-09-27 ENCOUNTER — Other Ambulatory Visit: Payer: Self-pay | Admitting: Internal Medicine

## 2018-11-18 ENCOUNTER — Encounter: Payer: Self-pay | Admitting: Internal Medicine

## 2018-11-20 ENCOUNTER — Encounter: Payer: Self-pay | Admitting: Internal Medicine

## 2018-11-20 MED ORDER — OMEPRAZOLE 40 MG PO CPDR: 40.0000 mg | DELAYED_RELEASE_CAPSULE | Freq: Every day | ORAL | 0 refills | Status: DC

## 2018-11-20 MED ORDER — GABAPENTIN 300 MG PO CAPS: ORAL_CAPSULE | ORAL | 0 refills | Status: DC

## 2018-11-20 MED ORDER — GLIPIZIDE ER 2.5 MG PO TB24: 2.5000 mg | ORAL_TABLET | Freq: Every day | ORAL | 0 refills | Status: DC

## 2018-11-20 NOTE — Telephone Encounter (Signed)
Glipizide and Omeprazole sent to [facility] but sending Gabapentin request to provider for review.

## 2018-11-23 ENCOUNTER — Other Ambulatory Visit: Payer: Self-pay | Admitting: *Deleted

## 2018-11-23 MED ORDER — LOSARTAN POTASSIUM 50 MG PO TABS: 50.0000 mg | ORAL_TABLET | Freq: Every day | ORAL | 0 refills | Status: DC

## 2018-11-25 ENCOUNTER — Other Ambulatory Visit: Payer: Self-pay

## 2018-11-25 MED ORDER — LOSARTAN POTASSIUM 50 MG PO TABS: 50.0000 mg | ORAL_TABLET | Freq: Every day | ORAL | 0 refills | Status: DC

## 2018-11-27 ENCOUNTER — Telehealth: Payer: Self-pay | Admitting: Cardiovascular Disease

## 2018-11-27 NOTE — Progress Notes (Signed)
Cardiology Office Note  Date:  11/30/2018   ID:  Melissa Cabrera, DOB May 28, 1955, MRN [id number]  PCP:  [name], NP   Chief Complaint  Patient presents with  . Other    Patient c/o waking up with legs swelled. Patient denies chest pain and SOB. Meds reviewed verbally with patient.     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 Previous on amlodipine but this was held  She reports improvement in her leg swelling on the Lasix Concerned about finding of grade 2 diastolic dysfunction on echocardiogram No regular exercise program Denies significant shortness of breath  Echo 07/24/2017  Left ventricle: The cavity size was normal. Systolic function was   normal. The estimated ejection fraction was in the range of 55%   to 60%. Wall motion was normal; there were no regional wall   motion abnormalities. Features are consistent with a pseudonormal   left ventricular filling pattern, with concomitant abnormal   relaxation and increased filling pressure (grade 2 diastolic   dysfunction). - Mitral valve: There was mild regurgitation. - Left atrium: The atrium was at the upper limits of normal in   size. - Right ventricle: Systolic function was normal. - Pulmonary arteries: Systolic pressure was midlly elevated. PA   peak pressure: 35 mm Hg (S).   PMH:   has a past medical history of Allergy, Arthritis, Cancer ([place]),  Chicken pox, Diabetes mellitus without complication ([place]), GERD (gastroesophageal reflux disease), Heart murmur, Hyperlipidemia, Hypertension, Psoriasis, Rheumatic fever, and Rosacea.  PSH:    Past Surgical History:  Procedure Laterality Date  . ABDOMINAL HYSTERECTOMY    . ANTERIOR AND POSTERIOR REPAIR N/A 12/04/2016   Procedure: ANTERIOR (CYSTOCELE) AND POSTERIOR REPAIR (RECTOCELE), TRANSOBTURATOR SLING PLACEMENT(ARIS);  Surgeon: Harlin Heys, MD;  Location: [facility] OR[facility];  Service: Gynecology;  Laterality: N/A;  . DILATION AND CURETTAGE OF UTERUS N/A 09/16/2016   Procedure: DILATATION AND CURETTAGE;  Surgeon: Harlin Heys, MD;  Location: [facility];  Service: Gynecology;  Laterality: N/A;  . LAPAROSCOPIC HYSTERECTOMY Bilateral 12/04/2016   Procedure: HYSTERECTOMY TOTAL LAPAROSCOPIC WITH BILATERAL SALPINGO OOPHERECTOMY;  Surgeon: Harlin Heys, MD;  Location: [facility];  Service: Gynecology;  Laterality: Bilateral;  . MOUTH SURGERY      Current Outpatient Medications  Medication Sig Dispense Refill  . albuterol (PROVENTIL HFA;VENTOLIN HFA) 108 (90 Base) MCG/ACT inhaler Inhale 2 puffs into the lungs every 6 (six) hours as needed for wheezing or shortness of breath. 1 Inhaler 2  . betamethasone dipropionate (DIPROLENE) 0.05 % ointment Apply 1 application topically 2 (two) times daily as needed. psoriasis     . calcium carbonate (TUMS EX) 750 MG chewable tablet Chew 750 mg by mouth daily.    . cetirizine (ZYRTEC) 10 MG tablet Take 10 mg by mouth at bedtime.     . Cholecalciferol (VITAMIN D3) 400 UNITS CAPS Take 400 Units by mouth daily.     Marland Kitchen  clotrimazole-betamethasone (LOTRISONE) cream APPLY TOPICALLY AS DIRECTED TWICE DAILY (Patient taking differently: Apply 1 application topically 2 (two) times daily as needed (BACTERIAL INFECTION). APPLY TOPICALLY AS DIRECTED TWICE DAILY) 60 g 5  . diphenhydrAMINE (BENADRYL) 25 mg capsule Take 50 mg by mouth at bedtime.     Marland Kitchen doxycycline (ADOXA) 100 MG  tablet Take 50 mg by mouth daily.    . fluocinonide ointment (LIDEX) 2.02 % Apply 1 application topically 2 (two) times daily as needed (PSORIASIS BEHIND EARS).     . fluticasone (FLONASE) 50 MCG/ACT nasal spray USE 1 SPRAY INTO BOTH  NOSTRILS AT BEDTIME. 16 g 11  . folic acid (FOLVITE) 1 MG tablet Take 1 mg by mouth daily.    . furosemide (LASIX) 40 MG tablet TAKE 1 TABLET BY MOUTH  DAILY 90 tablet 0  . gabapentin (NEURONTIN) 300 MG capsule TAKE 2 CAPSULES BY MOUTH 3  TIMES DAILY 540 capsule 0  . glipiZIDE (GLUCOTROL XL) 2.5 MG 24 hr tablet Take 1 tablet (2.5 mg total) by mouth daily with breakfast. 90 tablet 0  . ketoconazole (NIZORAL) 2 % shampoo Apply 1 application topically 2 (two) times a week.    . losartan (COZAAR) 50 MG tablet Take 1 tablet (50 mg total) by mouth daily. 90 tablet 0  . methotrexate (RHEUMATREX) 2.5 MG tablet Take 10 mg by mouth every Friday. Caution:Chemotherapy. Protect from light.     . NONFORMULARY OR COMPOUNDED ITEM See pharmacy note 120 each 2  . nystatin ointment (MYCOSTATIN) Apply 1 application topically 2 (two) times daily.    . Omega-3 Fatty Acids (FISH OIL PO) Take 1 capsule by mouth 2 (two) times daily. 1040 mg of fish oil in each capsule    . omeprazole (PRILOSEC) 40 MG capsule Take 1 capsule (40 mg total) by mouth daily. 90 capsule 0  . predniSONE (DELTASONE) 5 MG tablet TAKE 1 TABLET BY MOUTH ONCE DAILY FOR 30 DAYS    . simvastatin (ZOCOR) 10 MG tablet TAKE 1 TABLET BY MOUTH  DAILY 90 tablet 2  . SOOLANTRA 1 % CREA APPLY TO THE FACE EVERY DAY  6  . vitamin C (ASCORBIC ACID) 500 MG tablet Take 1,000 mg by mouth daily.      No current facility-administered medications for this visit.      Allergies:   Patient has no known allergies.   Social History:  The patient  reports that she quit smoking about 23 years ago. Her smoking use included cigarettes. She has a 64.00 pack-year smoking history. She has never used smokeless tobacco. She reports that she does  not drink alcohol or use drugs.   Family History:   family history includes Cancer in her mother and sister; Dementia in her mother; Skin cancer in her brother; Stroke in her maternal grandmother.    Review of Systems: Review of Systems  Constitutional: Negative.   Respiratory: Positive for cough.   Cardiovascular: Positive for leg swelling.  Gastrointestinal: Negative.   Musculoskeletal: Negative.   Neurological: Negative.   Psychiatric/Behavioral: Negative.   All other systems reviewed and are negative.   PHYSICAL EXAM: VS:  BP 138/70 (BP Location: Left Arm, Patient Position: Sitting, Cuff Size: Normal)   Pulse 78   Ht 5' (1.524 m)   Wt 237 lb (107.5 kg)   BMI 46.29 kg/m  , BMI Body mass index is 46.29 kg/m. Constitutional:  oriented to person, place, and time. No distress.  HENT:  Head: Grossly normal Eyes:  no discharge.  No scleral icterus.  Neck: No JVD, no carotid bruits  Cardiovascular: Regular rate and rhythm, no murmurs appreciated Pulmonary/Chest: Clear to auscultation bilaterally, no wheezes or rails Abdominal: Soft.  no distension.  no tenderness.  Musculoskeletal: Normal range of motion Neurological:  normal muscle tone. Coordination normal. No atrophy Skin: Skin warm and dry Psychiatric: normal affect, pleasant   Recent Labs: 06/30/2018: BUN 19; Creatinine, Ser 0.99; Potassium 4.4; Sodium 140    Lipid Panel Lab Results  Component Value Date   CHOL 181 02/04/2017   HDL 60.50 02/04/2017   LDLCALC 97 02/04/2017   TRIG 116.0 02/04/2017      Wt Readings from Last 3 Encounters:  11/30/18 237 lb (107.5 kg)  05/25/18 264 lb (119.7 kg)  03/30/18 254 lb 6.4 oz (115.4 kg)      ASSESSMENT AND PLAN:  Type 2 diabetes mellitus without complication, without long-term current use of insulin ([place]) We have encouraged continued exercise, careful diet management in an effort to lose weight.  Severe obesity (BMI >= 40) (HCC) We have encouraged continued  exercise, careful diet management in an effort to lose weight.  Pure hypercholesterolemia  low-dose simvastatin Numbers reviewed from 2019  Lower extremity edema Stable on current meds, Suggested compression hose  Essential hypertension Blood pressure is well controlled on today's visit. No changes made to the medications.  Diastolic dysfunction - Plan: EKG 12-Lead Stable, no SOB, fluids statis stable  Cough Concerning for bronchospastic cough resolved  Disposition:   F/U  As needed   Total encounter time more than 25 minutes  Greater than 50% was spent in counseling and coordination of care with the patient   No orders of the defined types were placed in this encounter.    Signed, Esmond Plants, M.D., Ph.D. 11/30/2018  [place], [place]

## 2018-11-27 NOTE — Telephone Encounter (Signed)

## 2018-11-30 ENCOUNTER — Encounter: Payer: Self-pay | Admitting: Cardiovascular Disease

## 2018-11-30 ENCOUNTER — Other Ambulatory Visit: Payer: Self-pay

## 2018-11-30 ENCOUNTER — Ambulatory Visit (INDEPENDENT_AMBULATORY_CARE_PROVIDER_SITE_OTHER): Payer: BC Managed Care – PPO | Admitting: Cardiovascular Disease

## 2018-11-30 VITALS — BP 138/70 | HR 78 | Ht 60.0 in | Wt 237.0 lb

## 2018-11-30 DIAGNOSIS — R0602 Shortness of breath: Secondary | ICD-10-CM

## 2018-11-30 DIAGNOSIS — E7800 Pure hypercholesterolemia, unspecified: Secondary | ICD-10-CM

## 2018-11-30 DIAGNOSIS — I10 Essential (primary) hypertension: Secondary | ICD-10-CM | POA: Diagnosis not present

## 2018-11-30 DIAGNOSIS — E119 Type 2 diabetes mellitus without complications: Secondary | ICD-10-CM

## 2018-11-30 DIAGNOSIS — R600 Localized edema: Secondary | ICD-10-CM

## 2018-11-30 NOTE — Patient Instructions (Signed)

## 2018-12-02 ENCOUNTER — Encounter: Payer: Self-pay | Admitting: Internal Medicine

## 2018-12-02 MED ORDER — FLUTICASONE PROPIONATE 50 MCG/ACT NA SUSP: NASAL | 0 refills | Status: DC

## 2018-12-04 ENCOUNTER — Encounter: Payer: Self-pay | Admitting: Cardiovascular Disease

## 2018-12-04 DIAGNOSIS — L4050 Arthropathic psoriasis, unspecified: Secondary | ICD-10-CM | POA: Diagnosis not present

## 2018-12-04 DIAGNOSIS — Z6841 Body Mass Index (BMI) 40.0 and over, adult: Secondary | ICD-10-CM | POA: Diagnosis not present

## 2018-12-04 DIAGNOSIS — E785 Hyperlipidemia, unspecified: Secondary | ICD-10-CM | POA: Diagnosis not present

## 2018-12-04 DIAGNOSIS — M150 Primary generalized (osteo)arthritis: Secondary | ICD-10-CM | POA: Diagnosis not present

## 2018-12-04 DIAGNOSIS — E119 Type 2 diabetes mellitus without complications: Secondary | ICD-10-CM | POA: Diagnosis not present

## 2018-12-04 DIAGNOSIS — L409 Psoriasis, unspecified: Secondary | ICD-10-CM | POA: Diagnosis not present

## 2018-12-08 ENCOUNTER — Encounter: Payer: Self-pay | Admitting: Cardiovascular Disease

## 2019-01-21 ENCOUNTER — Other Ambulatory Visit: Payer: Self-pay

## 2019-01-21 ENCOUNTER — Telehealth: Payer: Self-pay

## 2019-01-21 DIAGNOSIS — R6889 Other general symptoms and signs: Secondary | ICD-10-CM | POA: Diagnosis not present

## 2019-01-21 DIAGNOSIS — Z20822 Contact with and (suspected) exposure to covid-19: Secondary | ICD-10-CM

## 2019-01-21 NOTE — Telephone Encounter (Signed)
Agree with advice given

## 2019-01-21 NOTE — Telephone Encounter (Signed)
Pt left a message on VM stating she had some Covid symptoms questions.  Spoke to pt. For a week she has not felt well. Started to have a cough, headaches, body aches. No fever. Feels a little better today. She asked what her options would be. I told her she would need to go to an ER or UC or go to [facility] or [facility] for testing before 3:30 M-F. She is aware if she goes for testing that she and her family need to quarantine until the results come back. It is a good chance that the results will not in before the weekend.   She is going to [facility] for testing.

## 2019-01-22 ENCOUNTER — Telehealth: Payer: Self-pay

## 2019-01-22 LAB — NOVEL CORONAVIRUS, NAA: SARS-CoV-2, NAA: DETECTED — AB

## 2019-01-22 NOTE — Telephone Encounter (Signed)
Pt left v/m; pt request cb to discuss covid test results; per lab tab covid was detected;Please advise. Pt request cb  Today because she is worried. Avie Echevaria NP and Melanie CMA out of office.

## 2019-01-24 ENCOUNTER — Other Ambulatory Visit: Payer: Self-pay | Admitting: Family Medicine

## 2019-01-24 DIAGNOSIS — U071 COVID-19: Secondary | ICD-10-CM

## 2019-01-24 NOTE — Telephone Encounter (Signed)
I did not note this request for call back until 01/24/2019. I sent pt Mychart message with covid info and sign her up for Mychart covid companion.  Please make sure she is on respiratory call back list for [facility] patients and call her on Monday 9/14 to check on her.

## 2019-01-25 ENCOUNTER — Encounter (INDEPENDENT_AMBULATORY_CARE_PROVIDER_SITE_OTHER): Payer: Self-pay

## 2019-01-27 ENCOUNTER — Encounter (INDEPENDENT_AMBULATORY_CARE_PROVIDER_SITE_OTHER): Payer: Self-pay

## 2019-01-28 ENCOUNTER — Ambulatory Visit (INDEPENDENT_AMBULATORY_CARE_PROVIDER_SITE_OTHER): Payer: BC Managed Care – PPO | Admitting: Family Medicine

## 2019-01-28 ENCOUNTER — Telehealth: Payer: Self-pay

## 2019-01-28 ENCOUNTER — Encounter (INDEPENDENT_AMBULATORY_CARE_PROVIDER_SITE_OTHER): Payer: Self-pay

## 2019-01-28 ENCOUNTER — Encounter: Payer: Self-pay | Admitting: Family Medicine

## 2019-01-28 VITALS — BP 128/78 | HR 83 | Temp 99.8°F | Wt 235.0 lb

## 2019-01-28 DIAGNOSIS — R509 Fever, unspecified: Secondary | ICD-10-CM | POA: Diagnosis not present

## 2019-01-28 DIAGNOSIS — U071 COVID-19: Secondary | ICD-10-CM | POA: Diagnosis not present

## 2019-01-28 MED ORDER — AMOXICILLIN 500 MG PO CAPS: 1000.0000 mg | ORAL_CAPSULE | Freq: Two times a day (BID) | ORAL | 0 refills | Status: DC

## 2019-01-28 NOTE — Progress Notes (Signed)
VIRTUAL VISIT Due to national recommendations of social distancing due to [place] 19, a virtual visit is felt to be most appropriate for this patient at this time.   I connected with the patient on 01/28/19 at  2:40 PM EDT by virtual telehealth platform and verified that I am speaking with the correct person using two identifiers.   Interactive audio and video telecommunications were attempted between this provider and patient, however failed, due to patient having technical difficulties OR patient did not have access to video capability.  We continued and completed visit with audio only.   I discussed the limitations, risks, security and privacy concerns of performing an evaluation and management service by  virtual telehealth platform and the availability of in person appointments. I also discussed with the patient that there may be a patient responsible charge related to this service. The patient expressed understanding and agreed to proceed.  Patient location: Home Provider Location: [facility] Participants: Eliezer Lofts and [name]   Chief Complaint  Patient presents with  . Fever    still running a fever. Wanted to discuss what to do now.    History of Present Illness:   63 year old female patient of Webb Silversmith with recent positive COVID19 test on 9/10.  She reports she had new onset  fatigue, achiness on 01/15/2019. This was followed by worsening fatigue, headache and hot/chills, no fever. No initial cough, no congestion, no ST. Since then she had intermittent cough, mild.  4 days ago new onset fever...99-100.71F, intermittent.  No SOB, no chest pain. No productive cough.  Right ear pain if presses on it, stiff neck.  No face pain, no sinus pressure   She is not interested in going to  Urgent care at this time.  COVID 19 screen No recent travel or known exposure to COVID19 The patient denies respiratory symptoms of COVID 19 at this time.  The importance of social  distancing was discussed today.   Review of Systems  Constitutional: Positive for chills, fever and malaise/fatigue.  HENT: Positive for ear pain. Negative for ear discharge and nosebleeds.   Respiratory: Negative for cough, sputum production, shortness of breath and wheezing.       Past Medical History:  Diagnosis Date  . Allergy   . Arthritis   . Cancer (HCC)    endometrial  . Chicken pox   . Diabetes mellitus without complication ([place])   . GERD (gastroesophageal reflux disease)   . Heart murmur    DUE TO RHEUMATIC FEVER PER PT  . Hyperlipidemia   . Hypertension   . Psoriasis   . Rheumatic fever   . Rosacea     reports that she quit smoking about 23 years ago. Her smoking use included cigarettes. She has a 64.00 pack-year smoking history. She has never used smokeless tobacco. She reports that she does not drink alcohol or use drugs.   Current Outpatient Medications:  .  albuterol (PROVENTIL HFA;VENTOLIN HFA) 108 (90 Base) MCG/ACT inhaler, Inhale 2 puffs into the lungs every 6 (six) hours as needed for wheezing or shortness of breath., Disp: 1 Inhaler, Rfl: 2 .  betamethasone dipropionate (DIPROLENE) 0.05 % ointment, Apply 1 application topically 2 (two) times daily as needed. psoriasis , Disp: , Rfl:  .  calcium carbonate (TUMS EX) 750 MG chewable tablet, Chew 750 mg by mouth daily., Disp: , Rfl:  .  cetirizine (ZYRTEC) 10 MG tablet, Take 10 mg by mouth at bedtime. , Disp: ,  Rfl:  .  Cholecalciferol (VITAMIN D3) 400 UNITS CAPS, Take 400 Units by mouth daily. , Disp: , Rfl:  .  clotrimazole-betamethasone (LOTRISONE) cream, APPLY TOPICALLY AS DIRECTED TWICE DAILY (Patient taking differently: Apply 1 application topically 2 (two) times daily as needed (BACTERIAL INFECTION). APPLY TOPICALLY AS DIRECTED TWICE DAILY), Disp: 60 g, Rfl: 5 .  diphenhydrAMINE (BENADRYL) 25 mg capsule, Take 50 mg by mouth at bedtime. , Disp: , Rfl:  .  doxycycline (ADOXA) 100 MG tablet, Take 50 mg by mouth  daily., Disp: , Rfl:  .  fluocinonide ointment (LIDEX) AB-123456789 %, Apply 1 application topically 2 (two) times daily as needed (PSORIASIS BEHIND EARS). , Disp: , Rfl:  .  fluticasone (FLONASE) 50 MCG/ACT nasal spray, USE 1 SPRAY INTO BOTH  NOSTRILS AT BEDTIME., Disp: 48 g, Rfl: 0 .  folic acid (FOLVITE) 1 MG tablet, Take 1 mg by mouth daily., Disp: , Rfl:  .  furosemide (LASIX) 40 MG tablet, TAKE 1 TABLET BY MOUTH  DAILY, Disp: 90 tablet, Rfl: 0 .  gabapentin (NEURONTIN) 300 MG capsule, TAKE 2 CAPSULES BY MOUTH 3  TIMES DAILY, Disp: 540 capsule, Rfl: 0 .  glipiZIDE (GLUCOTROL XL) 2.5 MG 24 hr tablet, Take 1 tablet (2.5 mg total) by mouth daily with breakfast., Disp: 90 tablet, Rfl: 0 .  ketoconazole (NIZORAL) 2 % shampoo, Apply 1 application topically 2 (two) times a week., Disp: , Rfl:  .  losartan (COZAAR) 50 MG tablet, Take 1 tablet (50 mg total) by mouth daily., Disp: 90 tablet, Rfl: 0 .  methotrexate (RHEUMATREX) 2.5 MG tablet, Take 10 mg by mouth every Friday. Caution:Chemotherapy. Protect from light. , Disp: , Rfl:  .  NONFORMULARY OR COMPOUNDED ITEM, See pharmacy note, Disp: 120 each, Rfl: 2 .  nystatin ointment (MYCOSTATIN), Apply 1 application topically 2 (two) times daily., Disp: , Rfl:  .  Omega-3 Fatty Acids (FISH OIL PO), Take 1 capsule by mouth 2 (two) times daily. 1040 mg of fish oil in each capsule, Disp: , Rfl:  .  omeprazole (PRILOSEC) 40 MG capsule, Take 1 capsule (40 mg total) by mouth daily., Disp: 90 capsule, Rfl: 0 .  simvastatin (ZOCOR) 10 MG tablet, TAKE 1 TABLET BY MOUTH  DAILY, Disp: 90 tablet, Rfl: 2 .  SOOLANTRA 1 % CREA, APPLY TO THE FACE EVERY DAY, Disp: , Rfl: 6 .  vitamin C (ASCORBIC ACID) 500 MG tablet, Take 1,000 mg by mouth daily. , Disp: , Rfl:    Observations/Objective: Blood pressure 128/78, pulse 83, temperature 99.8 F (37.7 C), weight 235 lb (106.6 kg).  Physical Exam  Physical Exam Constitutional:      General: The patient is not in acute  distress. Pulmonary:     Effort: Pulmonary effort is normal. No respiratory distress.  Neurological:     Mental Status: The patient is alert and oriented to person, place, and time.  Psychiatric:        Mood and Affect: Mood normal.        Behavior: Behavior normal.  Assessment and Plan   COVID-19 virus infection Current persistent mild symptoms may be due to continued coronavirus. Discussed this possibility in detail.Marland Kitchen enncouraged her to have in person eval with urgent care or ER if continuing to worsen.  Fever Low grade temperature developing later in illness... some what concerning for bacterial superinfeciton.. has ear pain, minimal sinus symptoms and no shortness of breath. Doubt PNA or serior infeciton.  Will cover with amox for possible ear  infection but she will proceed to urgent care for in person eval if not improving.  she will follow up early next week with virtual visit.   I discussed the assessment and treatment plan with the patient. The patient was provided an opportunity to ask questions and all were answered. The patient agreed with the plan and demonstrated an understanding of the instructions.   The patient was advised to call back or seek an in-person evaluation if the symptoms worsen or if the condition fails to improve as anticipated.     Eliezer Lofts, MD

## 2019-01-28 NOTE — Assessment & Plan Note (Signed)
Low grade temperature developing later in illness... some what concerning for bacterial superinfeciton.. has ear pain, minimal sinus symptoms and no shortness of breath. Doubt PNA or serior infeciton.  Will cover with amox for possible ear infection but she will proceed to urgent care for in person eval if not improving.  she will follow up early next week with virtual visit.

## 2019-01-28 NOTE — Assessment & Plan Note (Signed)
Current persistent mild symptoms may be due to continued coronavirus. Discussed this possibility in detail.Marland Kitchen enncouraged her to have in person eval with urgent care or ER if continuing to worsen.

## 2019-01-28 NOTE — Telephone Encounter (Signed)
Pt left v/m just now that having problems with her phone and she was supposed to be on virtual visit with Dr Diona Browner. I tried to call pt back and got v/m that she could not take my call right now. FYI to Dr Diona Browner.

## 2019-01-29 ENCOUNTER — Encounter (INDEPENDENT_AMBULATORY_CARE_PROVIDER_SITE_OTHER): Payer: Self-pay

## 2019-01-30 ENCOUNTER — Encounter (INDEPENDENT_AMBULATORY_CARE_PROVIDER_SITE_OTHER): Payer: Self-pay

## 2019-01-31 ENCOUNTER — Telehealth: Payer: Self-pay | Admitting: *Deleted

## 2019-01-31 ENCOUNTER — Encounter (INDEPENDENT_AMBULATORY_CARE_PROVIDER_SITE_OTHER): Payer: Self-pay

## 2019-01-31 NOTE — Telephone Encounter (Signed)
Responding to BPA generated by [phone] questionnaire. Patient had one episode of loose stool during the night, none since. No blood or mucous noted. Denies abdominal pain/N/V. Encouraged a bland diet and increased fluids today. If more than 5-6 episodes or pain with stools please call back. May take imodium OTC as directed by the box. Stated she understood.

## 2019-02-01 ENCOUNTER — Encounter (INDEPENDENT_AMBULATORY_CARE_PROVIDER_SITE_OTHER): Payer: Self-pay

## 2019-02-01 ENCOUNTER — Telehealth: Payer: Self-pay

## 2019-02-01 ENCOUNTER — Ambulatory Visit: Payer: BC Managed Care – PPO | Admitting: Internal Medicine

## 2019-02-01 NOTE — Telephone Encounter (Signed)
Attempted to contact patient regarding questionnaire but no answer. Vm left for patient to contact us at [phone].

## 2019-02-01 NOTE — Telephone Encounter (Signed)
Upon transfer of call, patient was not on the line. TC, left VM to return call.

## 2019-02-02 ENCOUNTER — Encounter: Payer: Self-pay | Admitting: Internal Medicine

## 2019-02-02 ENCOUNTER — Encounter (INDEPENDENT_AMBULATORY_CARE_PROVIDER_SITE_OTHER): Payer: Self-pay

## 2019-02-02 ENCOUNTER — Ambulatory Visit (INDEPENDENT_AMBULATORY_CARE_PROVIDER_SITE_OTHER): Payer: BC Managed Care – PPO | Admitting: Internal Medicine

## 2019-02-02 DIAGNOSIS — U071 COVID-19: Secondary | ICD-10-CM

## 2019-02-02 NOTE — Progress Notes (Signed)
Virtual Visit via Video Note  I connected with Melissa Cabrera on 02/02/19 at  3:00 PM EDT by a video enabled telemedicine application and verified that I am speaking with the correct person using two identifiers.  Location: Patient: Home  Provider: Office   I discussed the limitations of evaluation and management by telemedicine and the availability of in person appointments. The patient expressed understanding and agreed to proceed.  History of Present Illness:  Pt reports recent diagnosis of COVID 19 infection. She was diagnosed on 9/11, started having symptoms on 9/4. She reports persistent fevers < 100.7, despite around the clock Tylenol. She reports fatigue, malaise, nasal congestion, cough and shortness of breath. She did a virtual visit with Dr. Diona Browner 9/17. She was prescribed Amoxicillin for possible ear/sinus involvement, which she reports she has not had any improvement with. She was also given Albuterol but she has not used this. She is concerned because symptoms have last so long. She thinks her husband also has COVID even though he tested negative.   Past Medical History:  Diagnosis Date  . Allergy   . Arthritis   . Cancer (HCC)    endometrial  . Chicken pox   . Diabetes mellitus without complication ([place])   . GERD (gastroesophageal reflux disease)   . Heart murmur    DUE TO RHEUMATIC FEVER PER PT  . Hyperlipidemia   . Hypertension   . Psoriasis   . Rheumatic fever   . Rosacea     Current Outpatient Medications  Medication Sig Dispense Refill  . albuterol (PROVENTIL HFA;VENTOLIN HFA) 108 (90 Base) MCG/ACT inhaler Inhale 2 puffs into the lungs every 6 (six) hours as needed for wheezing or shortness of breath. 1 Inhaler 2  . amoxicillin (AMOXIL) 500 MG capsule Take 2 capsules (1,000 mg total) by mouth 2 (two) times daily. 40 capsule 0  . betamethasone dipropionate (DIPROLENE) 0.05 % ointment Apply 1 application topically 2 (two) times daily as needed. psoriasis     .  calcium carbonate (TUMS EX) 750 MG chewable tablet Chew 750 mg by mouth daily.    . cetirizine (ZYRTEC) 10 MG tablet Take 10 mg by mouth at bedtime.     . Cholecalciferol (VITAMIN D3) 400 UNITS CAPS Take 400 Units by mouth daily.     . clotrimazole-betamethasone (LOTRISONE) cream APPLY TOPICALLY AS DIRECTED TWICE DAILY (Patient taking differently: Apply 1 application topically 2 (two) times daily as needed (BACTERIAL INFECTION). APPLY TOPICALLY AS DIRECTED TWICE DAILY) 60 g 5  . diphenhydrAMINE (BENADRYL) 25 mg capsule Take 50 mg by mouth at bedtime.     Marland Kitchen doxycycline (ADOXA) 100 MG tablet Take 50 mg by mouth daily.    . fluocinonide ointment (LIDEX) AB-123456789 % Apply 1 application topically 2 (two) times daily as needed (PSORIASIS BEHIND EARS).     . fluticasone (FLONASE) 50 MCG/ACT nasal spray USE 1 SPRAY INTO BOTH  NOSTRILS AT BEDTIME. 48 g 0  . folic acid (FOLVITE) 1 MG tablet Take 1 mg by mouth daily.    . furosemide (LASIX) 40 MG tablet TAKE 1 TABLET BY MOUTH  DAILY 90 tablet 0  . gabapentin (NEURONTIN) 300 MG capsule TAKE 2 CAPSULES BY MOUTH 3  TIMES DAILY 540 capsule 0  . glipiZIDE (GLUCOTROL XL) 2.5 MG 24 hr tablet Take 1 tablet (2.5 mg total) by mouth daily with breakfast. 90 tablet 0  . ketoconazole (NIZORAL) 2 % shampoo Apply 1 application topically 2 (two) times a week.    Marland Kitchen  losartan (COZAAR) 50 MG tablet Take 1 tablet (50 mg total) by mouth daily. 90 tablet 0  . methotrexate (RHEUMATREX) 2.5 MG tablet Take 10 mg by mouth every Friday. Caution:Chemotherapy. Protect from light.     . NONFORMULARY OR COMPOUNDED ITEM See pharmacy note 120 each 2  . nystatin ointment (MYCOSTATIN) Apply 1 application topically 2 (two) times daily.    . Omega-3 Fatty Acids (FISH OIL PO) Take 1 capsule by mouth 2 (two) times daily. 1040 mg of fish oil in each capsule    . omeprazole (PRILOSEC) 40 MG capsule Take 1 capsule (40 mg total) by mouth daily. 90 capsule 0  . simvastatin (ZOCOR) 10 MG tablet TAKE 1 TABLET  BY MOUTH  DAILY 90 tablet 2  . SOOLANTRA 1 % CREA APPLY TO THE FACE EVERY DAY  6  . vitamin C (ASCORBIC ACID) 500 MG tablet Take 1,000 mg by mouth daily.      No current facility-administered medications for this visit.     No Known Allergies  Family History  Problem Relation Age of Onset  . Cancer Mother        skin  . Dementia Mother   . Cancer Sister        skin and liver  . Stroke Maternal Grandmother   . Skin cancer Brother   . Diabetes Neg Hx   . Heart disease Neg Hx   . Breast cancer Neg Hx     Social History   Socioeconomic History  . Marital status: Married    Spouse name: Not on file  . Number of children: Not on file  . Years of education: Not on file  . Highest education level: Not on file  Occupational History  . Not on file  Social Needs  . Financial resource strain: Not on file  . Food insecurity    Worry: Not on file    Inability: Not on file  . Transportation needs    Medical: Not on file    Non-medical: Not on file  Tobacco Use  . Smoking status: Former Smoker    Packs/day: 4.00    Years: 16.00    Pack years: 64.00    Types: Cigarettes    Quit date: 09/12/1995    Years since quitting: 23.4  . Smokeless tobacco: Never Used  . Tobacco comment: quit in 1997  Substance and Sexual Activity  . Alcohol use: No    Alcohol/week: 0.0 standard drinks  . Drug use: No  . Sexual activity: Yes    Birth control/protection: None  Lifestyle  . Physical activity    Days per week: Not on file    Minutes per session: Not on file  . Stress: Not on file  Relationships  . Social Herbalist on phone: Not on file    Gets together: Not on file    Attends religious service: Not on file    Active member of club or organization: Not on file    Attends meetings of clubs or organizations: Not on file    Relationship status: Not on file  . Intimate partner violence    Fear of current or ex partner: Not on file    Emotionally abused: Not on file     Physically abused: Not on file    Forced sexual activity: Not on file  Other Topics Concern  . Not on file  Social History Narrative  . Not on file     Constitutional: Pt  reports fever, malaise, fatigue. Denies headache or abrupt weight changes.  HEENT: Pt reports nasal congestion. Denies eye pain, eye redness, ear pain, ringing in the ears, wax buildup, runny nose, bloody nose, or sore throat. Respiratory: Pt reports cough and shortness of breath with exertion. Denies difficulty breathing.   Cardiovascular: Denies chest pain, chest tightness, palpitations or swelling in the hands or feet.  Gastrointestinal: Denies abdominal pain, bloating, constipation, diarrhea or blood in the stool.   No other specific complaints in a complete review of systems (except as listed in HPI above).   Observations/Objective:  Wt Readings from Last 3 Encounters:  01/28/19 235 lb (106.6 kg)  11/30/18 237 lb (107.5 kg)  05/25/18 264 lb (119.7 kg)    Pulmonary/Chest: Intermittent dry cough noted. No audible wheezing or respiratory distress. Neurological: Alert and oriented.   BMET    Component Value Date/Time   NA 140 06/30/2018 1208   NA 143 01/07/2014   K 4.4 06/30/2018 1208   CL 102 06/30/2018 1208   CO2 32 06/30/2018 1208   GLUCOSE 124 (H) 06/30/2018 1208   BUN 19 06/30/2018 1208   CREATININE 0.99 06/30/2018 1208   CALCIUM 9.2 06/30/2018 1208   GFRNONAA >60 12/08/2016 1614   GFRAA >60 12/08/2016 1614    Lipid Panel     Component Value Date/Time   CHOL 181 02/04/2017 1007   TRIG 116.0 02/04/2017 1007   HDL 60.50 02/04/2017 1007   CHOLHDL 3 02/04/2017 1007   VLDL 23.2 02/04/2017 1007   LDLCALC 97 02/04/2017 1007    CBC    Component Value Date/Time   WBC 9.3 12/08/2016 1614   RBC 4.41 12/08/2016 1614   HGB 13.1 12/08/2016 1614   HGB 14.5 08/16/2016 1200   HCT 38.9 12/08/2016 1614   HCT 42.8 08/16/2016 1200   PLT 342 12/08/2016 1614   PLT 297 08/16/2016 1200   MCV 88.3  12/08/2016 1614   MCV 86 08/16/2016 1200   MCH 29.7 12/08/2016 1614   MCHC 33.6 12/08/2016 1614   RDW 14.9 (H) 12/08/2016 1614   RDW 13.5 08/16/2016 1200   LYMPHSABS 2.6 12/08/2016 1614   MONOABS 0.8 12/08/2016 1614   EOSABS 0.2 12/08/2016 1614   BASOSABS 0.0 12/08/2016 1614    Hgb A1C Lab Results  Component Value Date   HGBA1C 5.9 11/21/2017       Assessment and Plan:  COVID 19:  Continues to be symptomatic Encouraged rest, fluids Continue Tylenol for fever and body aches Advised Zyrtec and Flonase OTC Advised Delsym as needed for cough Encouraged her to use her Albuterol inhaler ER precautions discussed Will continue to monitor  Follow Up Instructions:    I discussed the assessment and treatment plan with the patient. The patient was provided an opportunity to ask questions and all were answered. The patient agreed with the plan and demonstrated an understanding of the instructions.   The patient was advised to call back or seek an in-person evaluation if the symptoms worsen or if the condition fails to improve as anticipated.  I provided 7:55 minutes of non-face-to-face time during this encounter.   Webb Silversmith, NP

## 2019-02-03 ENCOUNTER — Encounter (INDEPENDENT_AMBULATORY_CARE_PROVIDER_SITE_OTHER): Payer: Self-pay

## 2019-02-03 ENCOUNTER — Encounter: Payer: Self-pay | Admitting: Internal Medicine

## 2019-02-03 NOTE — Patient Instructions (Signed)
COVID-19: How to Protect Yourself and Others Know how it spreads  There is currently no vaccine to prevent coronavirus disease 2019 (COVID-19).  The best way to prevent illness is to avoid being exposed to this virus.  The virus is thought to spread mainly from person-to-person. ? Between people who are in close contact with one another (within about 6 feet). ? Through respiratory droplets produced when an infected person coughs, sneezes or talks. ? These droplets can land in the mouths or noses of people who are nearby or possibly be inhaled into the lungs. ? Some recent studies have suggested that COVID-19 may be spread by people who are not showing symptoms. Everyone should Clean your hands often  Wash your hands often with soap and water for at least 20 seconds especially after you have been in a public place, or after blowing your nose, coughing, or sneezing.  If soap and water are not readily available, use a hand sanitizer that contains at least 60% alcohol. Cover all surfaces of your hands and rub them together until they feel dry.  Avoid touching your eyes, nose, and mouth with unwashed hands. Avoid close contact  Stay home if you are sick.  Avoid close contact with people who are sick.  Put distance between yourself and other people. ? Remember that some people without symptoms may be able to spread virus. ? This is especially important for people who are at higher risk of getting very sick.www.cdc.gov/coronavirus/2019-ncov/need-extra-precautions/people-at-higher-risk.html Cover your mouth and nose with a cloth face cover when around others  You could spread COVID-19 to others even if you do not feel sick.  Everyone should wear a cloth face cover when they have to go out in public, for example to the grocery store or to pick up other necessities. ? Cloth face coverings should not be placed on young children under age 2, anyone who has trouble breathing, or is unconscious,  incapacitated or otherwise unable to remove the mask without assistance.  The cloth face cover is meant to protect other people in case you are infected.  Do NOT use a facemask meant for a healthcare worker.  Continue to keep about 6 feet between yourself and others. The cloth face cover is not a substitute for social distancing. Cover coughs and sneezes  If you are in a private setting and do not have on your cloth face covering, remember to always cover your mouth and nose with a tissue when you cough or sneeze or use the inside of your elbow.  Throw used tissues in the trash.  Immediately wash your hands with soap and water for at least 20 seconds. If soap and water are not readily available, clean your hands with a hand sanitizer that contains at least 60% alcohol. Clean and disinfect  Clean AND disinfect frequently touched surfaces daily. This includes tables, doorknobs, light switches, countertops, handles, desks, phones, keyboards, toilets, faucets, and sinks. www.cdc.gov/coronavirus/2019-ncov/prevent-getting-sick/disinfecting-your-home.html  If surfaces are dirty, clean them: Use detergent or soap and water prior to disinfection.  Then, use a household disinfectant. You can see a list of EPA-registered household disinfectants here. cdc.gov/coronavirus 09/15/2018 This information is not intended to replace advice given to you by your health care provider. Make sure you discuss any questions you have with your health care provider. Document Released: 08/25/2018 Document Revised: 09/23/2018 Document Reviewed: 08/25/2018 Elsevier Patient Education  2020 [employer] Inc.  

## 2019-02-04 ENCOUNTER — Encounter (INDEPENDENT_AMBULATORY_CARE_PROVIDER_SITE_OTHER): Payer: Self-pay

## 2019-02-05 ENCOUNTER — Encounter (INDEPENDENT_AMBULATORY_CARE_PROVIDER_SITE_OTHER): Payer: Self-pay

## 2019-02-12 ENCOUNTER — Other Ambulatory Visit: Payer: Self-pay | Admitting: Internal Medicine

## 2019-02-12 MED ORDER — FUROSEMIDE 40 MG PO TABS: 40.0000 mg | ORAL_TABLET | Freq: Every day | ORAL | 0 refills | Status: DC

## 2019-02-15 ENCOUNTER — Encounter: Payer: Self-pay | Admitting: Internal Medicine

## 2019-03-02 DIAGNOSIS — L718 Other rosacea: Secondary | ICD-10-CM | POA: Diagnosis not present

## 2019-03-02 DIAGNOSIS — X32XXXA Exposure to sunlight, initial encounter: Secondary | ICD-10-CM | POA: Diagnosis not present

## 2019-03-02 DIAGNOSIS — L570 Actinic keratosis: Secondary | ICD-10-CM | POA: Diagnosis not present

## 2019-03-02 DIAGNOSIS — L400 Psoriasis vulgaris: Secondary | ICD-10-CM | POA: Diagnosis not present

## 2019-03-03 ENCOUNTER — Other Ambulatory Visit: Payer: Self-pay | Admitting: Internal Medicine

## 2019-03-05 ENCOUNTER — Encounter: Payer: Self-pay | Admitting: Internal Medicine

## 2019-03-06 MED ORDER — SIMVASTATIN 10 MG PO TABS: 10.0000 mg | ORAL_TABLET | Freq: Every day | ORAL | 0 refills | Status: DC

## 2019-03-08 DIAGNOSIS — L409 Psoriasis, unspecified: Secondary | ICD-10-CM | POA: Diagnosis not present

## 2019-03-08 DIAGNOSIS — M150 Primary generalized (osteo)arthritis: Secondary | ICD-10-CM | POA: Diagnosis not present

## 2019-03-08 DIAGNOSIS — I5189 Other ill-defined heart diseases: Secondary | ICD-10-CM | POA: Diagnosis not present

## 2019-03-08 DIAGNOSIS — L4050 Arthropathic psoriasis, unspecified: Secondary | ICD-10-CM | POA: Diagnosis not present

## 2019-04-22 ENCOUNTER — Telehealth: Payer: Self-pay | Admitting: Internal Medicine

## 2019-04-22 NOTE — Telephone Encounter (Signed)
Pt reports worsening swelling in right leg x 1-2 weeks. Pt takes Lasix 40mg  tab daily.  Pt states that she is urinating normal.  NO chest pain or SOB noted. Pt is elevating leg as much as she can throughout the day.  Back of knee hurting a lot - about 4 inches above and below knee joint.  Pt states that she started [place] 30mg  BID around 03/08/2019 Pt states that she did not notice much swelling while on the medication. Pt states that she was off [place] for about 1 week d/t issues with the pharmacy and she noticed her swelling increased while off. Pt has been back on [place] x 3 days and her swelling is not getting any better. Not sure if the [place] was helping her swelling or if its a coincidence.  Pt aware that I will send this to [facility] for further recommendations.

## 2019-04-23 ENCOUNTER — Telehealth: Payer: Self-pay

## 2019-04-23 NOTE — Telephone Encounter (Signed)
She needs to schedule an appt in office for further evaluation.

## 2019-04-23 NOTE — Telephone Encounter (Signed)
Patient called checking on her previous message. I relayed the information to the patient. Appointment made for Monday. ER precautions given.  When I did COVID screening for appointment she said that she has her chronic allergies symptoms which is runny nose off and on. She still has loss of smell some since having COVID in September. No other symptoms.  I scheduled patient for in office visit on Monday for evaluation. Please let patient know if you do not think she can come in. Thank you.

## 2019-04-23 NOTE — Telephone Encounter (Signed)
error 

## 2019-04-24 ENCOUNTER — Other Ambulatory Visit: Payer: Self-pay | Admitting: Cardiovascular Disease

## 2019-04-24 ENCOUNTER — Other Ambulatory Visit: Payer: Self-pay | Admitting: Internal Medicine

## 2019-04-26 ENCOUNTER — Encounter: Payer: Self-pay | Admitting: Internal Medicine

## 2019-04-26 ENCOUNTER — Other Ambulatory Visit: Payer: Self-pay

## 2019-04-26 ENCOUNTER — Ambulatory Visit (INDEPENDENT_AMBULATORY_CARE_PROVIDER_SITE_OTHER): Payer: BC Managed Care – PPO | Admitting: Internal Medicine

## 2019-04-26 VITALS — BP 128/84 | HR 84 | Temp 98.0°F | Wt 241.0 lb

## 2019-04-26 DIAGNOSIS — I5032 Chronic diastolic (congestive) heart failure: Secondary | ICD-10-CM | POA: Diagnosis not present

## 2019-04-26 DIAGNOSIS — R040 Epistaxis: Secondary | ICD-10-CM | POA: Diagnosis not present

## 2019-04-26 DIAGNOSIS — J3489 Other specified disorders of nose and nasal sinuses: Secondary | ICD-10-CM

## 2019-04-26 DIAGNOSIS — R609 Edema, unspecified: Secondary | ICD-10-CM

## 2019-04-26 LAB — BASIC METABOLIC PANEL
BUN: 11 mg/dL (ref 6–23)
CO2: 31 mEq/L (ref 19–32)
Calcium: 9.6 mg/dL (ref 8.4–10.5)
Chloride: 103 mEq/L (ref 96–112)
Creatinine, Ser: 0.9 mg/dL (ref 0.40–1.20)
GFR: 63.23 mL/min (ref >60.00)
Glucose, Bld: 102 mg/dL — ABNORMAL HIGH (ref 70–99)
Potassium: 3.8 mEq/L (ref 3.5–5.1)
Sodium: 140 mEq/L (ref 135–145)

## 2019-04-26 LAB — BRAIN NATRIURETIC PEPTIDE: Pro B Natriuretic peptide (BNP): 21 pg/mL (ref 0.0–100.0)

## 2019-04-26 NOTE — Progress Notes (Signed)
Subjective:    Patient ID: Melissa Cabrera, female    DOB: [birth year], 63 y.o.   MRN: [id number]  HPI  Pt presents to the clinic today with c/o bilateral leg swelling. This has been an ongoing issue. She feels like the right is always worse than the left. She denies pain but reports it feels tight. She denies any new or worsening numbness and tingling of the lower extremities. She is have some shortness of breath, but feels like this is r/t recent COVID diagnosis. She denies chest pain or chest tightness. She is taking Furosemide 40 mg daily and elevating her legs as much as possible. She does not add salt to her diet. She does have a history of CHF, echo from 07/2017 reviewed.  She also c/o dry nose with frequent bleeding. She has been using Nasal Saline with minimal relief.   Review of Systems      Past Medical History:  Diagnosis Date  . Allergy   . Arthritis   . Cancer (HCC)    endometrial  . Chicken pox   . Diabetes mellitus without complication ([place])   . GERD (gastroesophageal reflux disease)   . Heart murmur    DUE TO RHEUMATIC FEVER PER PT  . Hyperlipidemia   . Hypertension   . Psoriasis   . Rheumatic fever   . Rosacea     Current Outpatient Medications  Medication Sig Dispense Refill  . albuterol (PROVENTIL HFA;VENTOLIN HFA) 108 (90 Base) MCG/ACT inhaler Inhale 2 puffs into the lungs every 6 (six) hours as needed for wheezing or shortness of breath. 1 Inhaler 2  . amoxicillin (AMOXIL) 500 MG capsule Take 2 capsules (1,000 mg total) by mouth 2 (two) times daily. 40 capsule 0  . Apremilast (OTEZLA) 30 MG TABS Take 1 tablet by mouth 2 (two) times daily.     . betamethasone dipropionate (DIPROLENE) 0.05 % ointment Apply 1 application topically 2 (two) times daily as needed. psoriasis     . calcium carbonate (TUMS EX) 750 MG chewable tablet Chew 750 mg by mouth daily.    . cetirizine (ZYRTEC) 10 MG tablet Take 10 mg by mouth at bedtime.     . Cholecalciferol (VITAMIN D3)  400 UNITS CAPS Take 400 Units by mouth daily.     . clotrimazole-betamethasone (LOTRISONE) cream APPLY TOPICALLY AS DIRECTED TWICE DAILY (Patient taking differently: Apply 1 application topically 2 (two) times daily as needed (BACTERIAL INFECTION). APPLY TOPICALLY AS DIRECTED TWICE DAILY) 60 g 5  . diphenhydrAMINE (BENADRYL) 25 mg capsule Take 50 mg by mouth at bedtime.     Marland Kitchen doxycycline (ADOXA) 100 MG tablet Take 50 mg by mouth daily.    . fluocinonide ointment (LIDEX) [id number] % Apply 1 application topically 2 (two) times daily as needed (PSORIASIS BEHIND EARS).     . fluticasone (FLONASE) 50 MCG/ACT nasal spray USE 1 SPRAY INTO BOTH  NOSTRILS AT BEDTIME. 48 g 0  . folic acid (FOLVITE) 1 MG tablet Take 1 mg by mouth daily.    . furosemide (LASIX) 40 MG tablet Take 1 tablet (40 mg total) by mouth daily. 90 tablet 0  . gabapentin (NEURONTIN) 300 MG capsule TAKE 2 CAPSULES THREE TIMES A DAY 540 capsule 0  . glipiZIDE (GLUCOTROL XL) 2.5 MG 24 hr tablet TAKE 1 TABLET DAILY WITH BREAKFAST 90 tablet 0  . ketoconazole (NIZORAL) 2 % shampoo Apply 1 application topically 2 (two) times a week.    . losartan (COZAAR) 50 MG tablet Take  1 tablet (50 mg total) by mouth daily. 90 tablet 0  . methotrexate (RHEUMATREX) 2.5 MG tablet Take 10 mg by mouth every Friday. Caution:Chemotherapy. Protect from light.     . NONFORMULARY OR COMPOUNDED ITEM See pharmacy note 120 each 2  . nystatin ointment (MYCOSTATIN) Apply 1 application topically 2 (two) times daily.    . Omega-3 Fatty Acids (FISH OIL PO) Take 1 capsule by mouth 2 (two) times daily. 1040 mg of fish oil in each capsule    . omeprazole (PRILOSEC) 40 MG capsule Take 1 capsule (40 mg total) by mouth daily. 90 capsule 0  . simvastatin (ZOCOR) 10 MG tablet Take 1 tablet (10 mg total) by mouth daily. 90 tablet 0  . SOOLANTRA 1 % CREA APPLY TO THE FACE EVERY DAY  6  . vitamin C (ASCORBIC ACID) 500 MG tablet Take 1,000 mg by mouth daily.      No current  facility-administered medications for this visit.    No Known Allergies  Family History  Problem Relation Age of Onset  . Cancer Mother        skin  . Dementia Mother   . Cancer Sister        skin and liver  . Stroke Maternal Grandmother   . Skin cancer Brother   . Diabetes Neg Hx   . Heart disease Neg Hx   . Breast cancer Neg Hx     Social History   Socioeconomic History  . Marital status: Married    Spouse name: Not on file  . Number of children: Not on file  . Years of education: Not on file  . Highest education level: Not on file  Occupational History  . Not on file  Tobacco Use  . Smoking status: Former Smoker    Packs/day: 4.00    Years: 16.00    Pack years: 64.00    Types: Cigarettes    Quit date: 09/12/1995    Years since quitting: 23.6  . Smokeless tobacco: Never Used  . Tobacco comment: quit in 1997  Substance and Sexual Activity  . Alcohol use: No    Alcohol/week: 0.0 standard drinks  . Drug use: No  . Sexual activity: Yes    Birth control/protection: None  Other Topics Concern  . Not on file  Social History Narrative  . Not on file   Social Determinants of Health   Financial Resource Strain:   . Difficulty of Paying Living Expenses: Not on file  Food Insecurity:   . Worried About Charity fundraiser in the Last Year: Not on file  . Ran Out of Food in the Last Year: Not on file  Transportation Needs:   . Lack of Transportation (Medical): Not on file  . Lack of Transportation (Non-Medical): Not on file  Physical Activity:   . Days of Exercise per Week: Not on file  . Minutes of Exercise per Session: Not on file  Stress:   . Feeling of Stress : Not on file  Social Connections:   . Frequency of Communication with Friends and Family: Not on file  . Frequency of Social Gatherings with Friends and Family: Not on file  . Attends Religious Services: Not on file  . Active Member of Clubs or Organizations: Not on file  . Attends Theatre manager Meetings: Not on file  . Marital Status: Not on file  Intimate Partner Violence:   . Fear of Current or Ex-Partner: Not on file  .  Emotionally Abused: Not on file  . Physically Abused: Not on file  . Sexually Abused: Not on file     Constitutional: Denies fever, malaise, fatigue, headache or abrupt weight changes.  HEENT: Pt report dry nose, bleeding. Denies runny nose, nasal congestion. Respiratory: Pt reports intermittent shortness of breath. Denies difficulty breathing, cough or sputum production.   Cardiovascular: Pt reports leg swelling. Denies chest pain, chest tightness, palpitations or swelling in the hands.  Musculoskeletal: Denies decrease in range of motion, difficulty with gait, muscle pain or joint pain and swelling.  Skin: Denies redness, rashes, lesions or ulcercations.   No other specific complaints in a complete review of systems (except as listed in HPI above).  Objective:   Physical Exam   BP 128/84   Pulse 84   Temp 98 F (36.7 C) (Temporal)   Wt 241 lb (109.3 kg)   SpO2 97%   BMI 47.07 kg/m   Wt Readings from Last 3 Encounters:  01/28/19 235 lb (106.6 kg)  11/30/18 237 lb (107.5 kg)  05/25/18 264 lb (119.7 kg)    General: Appears her stated age, obese, in NAD. HEENT: Head: normal shape and size. Nose: mucosa red, dry and irritated. Bloody scab noted in the right nostril. No pus noted. Skin: Warm, dry and intact. No redness, weeping or open wounds noted. Cardiovascular: Normal rate and rhythm. S1,S2 noted.  No murmur, rubs or gallops noted. 1+ non pitting LLE, 2+ non pitting RLE edema.  Pulmonary/Chest: Normal effort and positive vesicular breath sounds. No respiratory distress. No wheezes, rales or ronchi noted.  Musculoskeletal: Gait slow and steady. Neurological: Alert and oriented. Sensation intact to BLE.   BMET    Component Value Date/Time   NA 140 06/30/2018 1208   NA 143 01/07/2014 0000   K 4.4 06/30/2018 1208   CL 102  06/30/2018 1208   CO2 32 06/30/2018 1208   GLUCOSE 124 (H) 06/30/2018 1208   BUN 19 06/30/2018 1208   CREATININE 0.99 06/30/2018 1208   CALCIUM 9.2 06/30/2018 1208   GFRNONAA >60 12/08/2016 1614   GFRAA >60 12/08/2016 1614    Lipid Panel     Component Value Date/Time   CHOL 181 02/04/2017 1007   TRIG 116.0 02/04/2017 1007   HDL 60.50 02/04/2017 1007   CHOLHDL 3 02/04/2017 1007   VLDL 23.2 02/04/2017 1007   LDLCALC 97 02/04/2017 1007    CBC    Component Value Date/Time   WBC 9.3 12/08/2016 1614   RBC 4.41 12/08/2016 1614   HGB 13.1 12/08/2016 1614   HGB 14.5 08/16/2016 1200   HCT 38.9 12/08/2016 1614   HCT 42.8 08/16/2016 1200   PLT 342 12/08/2016 1614   PLT 297 08/16/2016 1200   MCV 88.3 12/08/2016 1614   MCV 86 08/16/2016 1200   MCH 29.7 12/08/2016 1614   MCHC 33.6 12/08/2016 1614   RDW 14.9 (H) 12/08/2016 1614   RDW 13.5 08/16/2016 1200   LYMPHSABS 2.6 12/08/2016 1614   MONOABS 0.8 12/08/2016 1614   EOSABS 0.2 12/08/2016 1614   BASOSABS 0.0 12/08/2016 1614    Hgb A1C Lab Results  Component Value Date   HGBA1C 5.9 11/21/2017           Assessment & Plan:  Peripheral Edema, CHF- Diastolic:  BMET, BNP and D dimer today Encouraged low salt diet, exercise for weight loss Continue elevation Consider increased Furosemide or adding evening dose vs Metolazone 3 x week pending labs She had a bump in her  creatinine with Torsemide, so we stopped that Consider ultrasound, RLE if D dimer elevated  Dry Nose, Nose Bleeds:  Continue Nasal Saline Can place a little Vaseline inside the nose Get a cool mist humidifier and use at night  Will follow up after labs, return precautions discussed  Webb Silversmith, NP This visit occurred during the SARS-CoV-2 public health emergency.  Safety protocols were in place, including screening questions prior to the visit, additional usage of staff PPE, and extensive cleaning of exam room while observing appropriate contact time  as indicated for disinfecting solutions.

## 2019-04-26 NOTE — Patient Instructions (Signed)
Peripheral Edema  Peripheral edema is swelling that is caused by a buildup of fluid. Peripheral edema most often affects the lower legs, ankles, and feet. It can also develop in the arms, hands, and face. The area of the body that has peripheral edema will look swollen. It may also feel heavy or warm. Your clothes may start to feel tight. Pressing on the area may make a temporary dent in your skin. You may not be able to move your swollen arm or leg as much as usual. There are many causes of peripheral edema. It can happen because of a complication of other conditions such as congestive heart failure, kidney disease, or a problem with your blood circulation. It also can be a side effect of certain medicines or because of an infection. It often happens to women during pregnancy. Sometimes, the cause is not known. Follow these instructions at home: Managing pain, stiffness, and swelling   Raise (elevate) your legs while you are sitting or lying down.  Move around often to prevent stiffness and to lessen swelling.  Do not sit or stand for long periods of time.  Wear support stockings as told by your health care provider. Medicines  Take over-the-counter and prescription medicines only as told by your health care provider.  Your health care provider may prescribe medicine to help your body get rid of excess water (diuretic). General instructions  Pay attention to any changes in your symptoms.  Follow instructions from your health care provider about limiting salt (sodium) in your diet. Sometimes, eating less salt may reduce swelling.  Moisturize skin daily to help prevent skin from cracking and draining.  Keep all follow-up visits as told by your health care provider. This is important. Contact a health care provider if you have:  A fever.  Edema that starts suddenly or is getting worse, especially if you are pregnant or have a medical condition.  Swelling in only one leg.  Increased  swelling, redness, or pain in one or both of your legs.  Drainage or sores at the area where you have edema. Get help right away if you:  Develop shortness of breath, especially when you are lying down.  Have pain in your chest or abdomen.  Feel weak.  Feel faint. Summary  Peripheral edema is swelling that is caused by a buildup of fluid. Peripheral edema most often affects the lower legs, ankles, and feet.  Move around often to prevent stiffness and to lessen swelling. Do not sit or stand for long periods of time.  Pay attention to any changes in your symptoms.  Contact a health care provider if you have edema that starts suddenly or is getting worse, especially if you are pregnant or have a medical condition.  Get help right away if you develop shortness of breath, especially when lying down. This information is not intended to replace advice given to you by your health care provider. Make sure you discuss any questions you have with your health care provider. Document Released: 06/06/2004 Document Revised: 01/21/2018 Document Reviewed: 01/21/2018 Elsevier Patient Education  2020 [employer].

## 2019-04-27 ENCOUNTER — Other Ambulatory Visit: Payer: Self-pay | Admitting: Internal Medicine

## 2019-04-27 ENCOUNTER — Other Ambulatory Visit: Payer: Self-pay

## 2019-04-27 ENCOUNTER — Telehealth: Payer: Self-pay

## 2019-04-27 ENCOUNTER — Ambulatory Visit
Admission: RE | Admit: 2019-04-27 | Discharge: 2019-04-27 | Disposition: A | Discharge status: Home / Self Care | Payer: BC Managed Care – PPO | Source: Ambulatory Visit | Attending: Internal Medicine | Admitting: Internal Medicine

## 2019-04-27 DIAGNOSIS — M7989 Other specified soft tissue disorders: Secondary | ICD-10-CM | POA: Diagnosis not present

## 2019-04-27 DIAGNOSIS — I82431 Acute embolism and thrombosis of right popliteal vein: Secondary | ICD-10-CM | POA: Diagnosis not present

## 2019-04-27 LAB — D-DIMER, QUANTITATIVE: D-Dimer, Quant: 4.77 mcg/mL FEU — ABNORMAL HIGH (ref <0.50)

## 2019-04-27 MED ORDER — RIVAROXABAN 15 MG PO TABS: 15.0000 mg | ORAL_TABLET | Freq: Two times a day (BID) | ORAL | 0 refills | Status: DC

## 2019-04-27 MED ORDER — RIVAROXABAN 20 MG PO TABS: 20.0000 mg | ORAL_TABLET | Freq: Every day | ORAL | 1 refills | Status: DC

## 2019-04-27 NOTE — Telephone Encounter (Signed)
Pt notified by provider. Started on Xarelto. Hematology referral placed

## 2019-04-27 NOTE — Addendum Note (Signed)
Addended by: [name] on: 04/27/2019 08:33 AM   Modules accepted: Orders

## 2019-04-27 NOTE — Telephone Encounter (Signed)
Jennifer with [facility] [place] has called report + DVT. Pt is still at [facility].

## 2019-04-30 ENCOUNTER — Inpatient Hospital Stay: Payer: BC Managed Care – PPO

## 2019-04-30 ENCOUNTER — Encounter: Payer: Self-pay | Admitting: Oncology

## 2019-04-30 ENCOUNTER — Other Ambulatory Visit: Payer: Self-pay

## 2019-04-30 ENCOUNTER — Inpatient Hospital Stay: Payer: BC Managed Care – PPO | Attending: Oncology | Admitting: Oncology

## 2019-04-30 VITALS — BP 152/82 | HR 78 | Temp 97.4°F | Resp 16 | Wt 244.3 lb

## 2019-04-30 DIAGNOSIS — I82431 Acute embolism and thrombosis of right popliteal vein: Secondary | ICD-10-CM

## 2019-04-30 DIAGNOSIS — Z129 Encounter for screening for malignant neoplasm, site unspecified: Secondary | ICD-10-CM

## 2019-04-30 DIAGNOSIS — Z87891 Personal history of nicotine dependence: Secondary | ICD-10-CM

## 2019-04-30 DIAGNOSIS — I82411 Acute embolism and thrombosis of right femoral vein: Secondary | ICD-10-CM | POA: Diagnosis not present

## 2019-04-30 LAB — CBC WITH DIFFERENTIAL/PLATELET
Abs Immature Granulocytes: 0.02 10*3/uL (ref 0.00–0.07)
Basophils Absolute: 0 10*3/uL (ref 0.0–0.1)
Basophils Relative: 1 %
Eosinophils Absolute: 0.2 10*3/uL (ref 0.0–0.5)
Eosinophils Relative: 4 %
HCT: 38.8 % (ref 36.0–46.0)
Hemoglobin: 12.3 g/dL (ref 12.0–15.0)
Immature Granulocytes: 0 %
Lymphocytes Relative: 33 %
Lymphs Abs: 2.1 10*3/uL (ref 0.7–4.0)
MCH: 28.5 pg (ref 26.0–34.0)
MCHC: 31.7 g/dL (ref 30.0–36.0)
MCV: 90 fL (ref 80.0–100.0)
Monocytes Absolute: 0.6 10*3/uL (ref 0.1–1.0)
Monocytes Relative: 10 %
Neutro Abs: 3.4 10*3/uL (ref 1.7–7.7)
Neutrophils Relative %: 52 %
Platelets: 313 10*3/uL (ref 150–400)
RBC: 4.31 MIL/uL (ref 3.87–5.11)
RDW: 15 % (ref 11.5–15.5)
WBC: 6.4 10*3/uL (ref 4.0–10.5)
nRBC: 0 % (ref 0.0–0.2)

## 2019-04-30 LAB — COMPREHENSIVE METABOLIC PANEL
ALT: 16 U/L (ref 0–44)
AST: 19 U/L (ref 15–41)
Albumin: 4 g/dL (ref 3.5–5.0)
Alkaline Phosphatase: 78 U/L (ref 38–126)
Anion gap: 9 (ref 5–15)
BUN: 10 mg/dL (ref 8–23)
CO2: 26 mmol/L (ref 22–32)
Calcium: 9.6 mg/dL (ref 8.9–10.3)
Chloride: 105 mmol/L (ref 98–111)
Creatinine, Ser: 0.84 mg/dL (ref 0.44–1.00)
GFR calc Af Amer: >60 mL/min (ref >60)
GFR calc non Af Amer: >60 mL/min (ref >60)
Glucose, Bld: 121 mg/dL — ABNORMAL HIGH (ref 70–99)
Potassium: 4.2 mmol/L (ref 3.5–5.1)
Sodium: 140 mmol/L (ref 135–145)
Total Bilirubin: 0.6 mg/dL (ref 0.3–1.2)
Total Protein: 7.8 g/dL (ref 6.5–8.1)

## 2019-04-30 NOTE — Progress Notes (Signed)
Hematology/Oncology Consult note [facility] [phone] [phone]   Patient Care Team: [name], NP as PCP - General (Internal Medicine) Clent Jacks, RN as Registered Nurse  REFERRING PROVIDER: [name], NP  CHIEF COMPLAINTS/REASON FOR VISIT:  Evaluation of DVT  HISTORY OF PRESENTING ILLNESS:   Melissa Cabrera is a  63 y.o.  female with PMH listed below was seen in consultation at the request of  [name], NP  for evaluation of DVT Patient recently presented to primary care provider's office complaining about bilateral leg swelling.  Patient has chronic lower extremity edema,-she has a history of CHF .  Patient noticed right side worse in the left side.  Feels tight.  Patient has chronic shortness of breath, which is worse after recent Covid 19 infection.  At that time she had fatigue, headache, body aches.  Patient also takes furosemide 40 mg daily. Denies any hemoptysis or chest pain. 04/27/2019 venous Doppler ultrasound of right lower extremity showed acute right femoral-popliteal DVT and Baker's cyst.  Patient was started on anticoagulation with Xarelto.  Currently she is taking 50 mg twice daily.  Reports tolerating well.  She feels that her right lower extremity swelling has significantly improved.  Previously she had difficulties putting on her pants due to the right lower extremity swelling.  None she has no difficulties.  Age-appropriate cancer screening, she is over due for colonoscopy.  Review of Systems  Constitutional: Positive for fatigue. Negative for appetite change, chills and fever.  HENT:   Negative for hearing loss and voice change.   Eyes: Negative for eye problems.  Respiratory: Negative for chest tightness and cough.        Chronic shortness of breath  Cardiovascular: Positive for leg swelling. Negative for chest pain.  Gastrointestinal: Negative for abdominal distention, abdominal pain and  blood in stool.  Endocrine: Negative for hot flashes.  Genitourinary: Negative for difficulty urinating and frequency.   Musculoskeletal: Negative for arthralgias.  Skin: Negative for itching and rash.  Neurological: Negative for extremity weakness.  Hematological: Negative for adenopathy.  Psychiatric/Behavioral: Negative for confusion.    MEDICAL HISTORY:  Past Medical History:  Diagnosis Date  . Allergy   . Arthritis   . Cancer (HCC)    endometrial  . Chicken pox   . Diabetes mellitus without complication ([place])   . GERD (gastroesophageal reflux disease)   . Heart murmur    DUE TO RHEUMATIC FEVER PER PT  . Hyperlipidemia   . Hypertension   . Psoriasis   . Rheumatic fever   . Rosacea     SURGICAL HISTORY: Past Surgical History:  Procedure Laterality Date  . ABDOMINAL HYSTERECTOMY    . ANTERIOR AND POSTERIOR REPAIR N/A 12/04/2016   Procedure: ANTERIOR (CYSTOCELE) AND POSTERIOR REPAIR (RECTOCELE), TRANSOBTURATOR SLING PLACEMENT(ARIS);  Surgeon: Harlin Heys, MD;  Location: [facility] ORS;  Service: Gynecology;  Laterality: N/A;  . DILATION AND CURETTAGE OF UTERUS N/A 09/16/2016   Procedure: DILATATION AND CURETTAGE;  Surgeon: Harlin Heys, MD;  Location: [facility] ORS;  Service: Gynecology;  Laterality: N/A;  . LAPAROSCOPIC HYSTERECTOMY Bilateral 12/04/2016   Procedure: HYSTERECTOMY TOTAL LAPAROSCOPIC WITH BILATERAL SALPINGO OOPHERECTOMY;  Surgeon: Harlin Heys, MD;  Location: [facility] ORS;  Service: Gynecology;  Laterality: Bilateral;  . MOUTH SURGERY      SOCIAL HISTORY: Social History   Socioeconomic History  . Marital status: Married    Spouse name: Not on file  . Number of children: Not  on file  . Years of education: Not on file  . Highest education level: Not on file  Occupational History  . Not on file  Tobacco Use  . Smoking status: Former Smoker    Packs/day: 4.00    Years: 16.00    Pack years: 64.00    Types: Cigarettes    Quit date: 09/12/1995     Years since quitting: 23.6  . Smokeless tobacco: Never Used  . Tobacco comment: quit in 1997  Substance and Sexual Activity  . Alcohol use: No    Alcohol/week: 0.0 standard drinks  . Drug use: No  . Sexual activity: Yes    Birth control/protection: None  Other Topics Concern  . Not on file  Social History Narrative  . Not on file   Social Determinants of Health   Financial Resource Strain:   . Difficulty of Paying Living Expenses: Not on file  Food Insecurity:   . Worried About Charity fundraiser in the Last Year: Not on file  . Ran Out of Food in the Last Year: Not on file  Transportation Needs:   . Lack of Transportation (Medical): Not on file  . Lack of Transportation (Non-Medical): Not on file  Physical Activity:   . Days of Exercise per Week: Not on file  . Minutes of Exercise per Session: Not on file  Stress:   . Feeling of Stress : Not on file  Social Connections:   . Frequency of Communication with Friends and Family: Not on file  . Frequency of Social Gatherings with Friends and Family: Not on file  . Attends Religious Services: Not on file  . Active Member of Clubs or Organizations: Not on file  . Attends Archivist Meetings: Not on file  . Marital Status: Not on file  Intimate Partner Violence:   . Fear of Current or Ex-Partner: Not on file  . Emotionally Abused: Not on file  . Physically Abused: Not on file  . Sexually Abused: Not on file    FAMILY HISTORY: Family History  Problem Relation Age of Onset  . Cancer Mother        skin  . Dementia Mother   . Cancer Sister        skin and liver  . Stroke Maternal Grandmother   . Skin cancer Brother   . Diabetes Neg Hx   . Heart disease Neg Hx   . Breast cancer Neg Hx     ALLERGIES:  has No Known Allergies.  MEDICATIONS:  Current Outpatient Medications  Medication Sig Dispense Refill  . albuterol (PROVENTIL HFA;VENTOLIN HFA) 108 (90 Base) MCG/ACT inhaler Inhale 2 puffs into the lungs  every 6 (six) hours as needed for wheezing or shortness of breath. 1 Inhaler 2  . Apremilast (OTEZLA) 30 MG TABS Take 1 tablet by mouth 2 (two) times daily.     . betamethasone dipropionate (DIPROLENE) 0.05 % ointment Apply 1 application topically 2 (two) times daily as needed. psoriasis     . calcium carbonate (TUMS EX) 750 MG chewable tablet Chew 750 mg by mouth daily.    . cetirizine (ZYRTEC) 10 MG tablet Take 10 mg by mouth at bedtime.     . Cholecalciferol (VITAMIN D3) 400 UNITS CAPS Take 400 Units by mouth daily.     . clotrimazole-betamethasone (LOTRISONE) cream APPLY TOPICALLY AS DIRECTED TWICE DAILY (Patient taking differently: Apply 1 application topically 2 (two) times daily as needed (BACTERIAL INFECTION). APPLY TOPICALLY AS DIRECTED TWICE  DAILY) 60 g 5  . diphenhydrAMINE (BENADRYL) 25 mg capsule Take 50 mg by mouth at bedtime.     Marland Kitchen doxycycline (ADOXA) 100 MG tablet Take 50 mg by mouth daily.    . fluocinonide ointment (LIDEX) [id number] % Apply 1 application topically 2 (two) times daily as needed (PSORIASIS BEHIND EARS).     . fluticasone (FLONASE) 50 MCG/ACT nasal spray USE 1 SPRAY INTO BOTH  NOSTRILS AT BEDTIME. 48 g 0  . folic acid (FOLVITE) 1 MG tablet Take 1 mg by mouth daily.    . furosemide (LASIX) 40 MG tablet TAKE 1 TABLET DAILY 90 tablet 0  . gabapentin (NEURONTIN) 300 MG capsule TAKE 2 CAPSULES THREE TIMES A DAY 540 capsule 0  . glipiZIDE (GLUCOTROL XL) 2.5 MG 24 hr tablet TAKE 1 TABLET DAILY WITH BREAKFAST 90 tablet 0  . ketoconazole (NIZORAL) 2 % shampoo Apply 1 application topically 2 (two) times a week.    . losartan (COZAAR) 50 MG tablet Take 1 tablet (50 mg total) by mouth daily. 90 tablet 2  . methotrexate (RHEUMATREX) 2.5 MG tablet Take 10 mg by mouth every Friday. Caution:Chemotherapy. Protect from light.     . NONFORMULARY OR COMPOUNDED ITEM See pharmacy note 120 each 2  . nystatin ointment (MYCOSTATIN) Apply 1 application topically 2 (two) times daily.    . Omega-3  Fatty Acids (FISH OIL PO) Take 1 capsule by mouth 2 (two) times daily. 1040 mg of fish oil in each capsule    . omeprazole (PRILOSEC) 40 MG capsule Take 1 capsule (40 mg total) by mouth daily. 90 capsule 0  . Rivaroxaban (XARELTO) 15 MG TABS tablet Take 1 tablet (15 mg total) by mouth 2 (two) times daily with a meal. 42 tablet 0  . simvastatin (ZOCOR) 10 MG tablet Take 1 tablet (10 mg total) by mouth daily. 90 tablet 0  . SOOLANTRA 1 % CREA APPLY TO THE FACE EVERY DAY  6  . vitamin C (ASCORBIC ACID) 500 MG tablet Take 1,000 mg by mouth daily.     . rivaroxaban (XARELTO) 20 MG TABS tablet Take 1 tablet (20 mg total) by mouth daily with supper. (Patient not taking: Reported on 04/30/2019) 90 tablet 1   No current facility-administered medications for this visit.     PHYSICAL EXAMINATION: ECOG PERFORMANCE STATUS: 1 - Symptomatic but completely ambulatory Vitals:   04/30/19 1120  BP: (!) 152/82  Pulse: 78  Resp: 16  Temp: (!) 97.4 F (36.3 C)  SpO2: 96%   Filed Weights   04/30/19 1120  Weight: 244 lb 4.8 oz (110.8 kg)    Physical Exam Constitutional:      General: She is not in acute distress.    Appearance: She is obese.  HENT:     Head: Normocephalic and atraumatic.  Eyes:     General: No scleral icterus.    Pupils: Pupils are equal, round, and reactive to light.  Cardiovascular:     Rate and Rhythm: Normal rate and regular rhythm.     Heart sounds: Normal heart sounds.  Pulmonary:     Effort: Pulmonary effort is normal. No respiratory distress.     Breath sounds: No wheezing.  Abdominal:     General: Bowel sounds are normal. There is no distension.     Palpations: Abdomen is soft. There is no mass.     Tenderness: There is no abdominal tenderness.  Musculoskeletal:        General: Swelling present. No deformity.  Normal range of motion.     Cervical back: Normal range of motion and neck supple.     Comments: Bilateral lower extremity edema, right>left  Skin:     General: Skin is warm and dry.     Findings: No erythema or rash.  Neurological:     Mental Status: She is alert and oriented to person, place, and time.     Cranial Nerves: No cranial nerve deficit.     Coordination: Coordination normal.  Psychiatric:        Behavior: Behavior normal.        Thought Content: Thought content normal.     LABORATORY DATA:  I have reviewed the data as listed Lab Results  Component Value Date   WBC 6.4 04/30/2019   HGB 12.3 04/30/2019   HCT 38.8 04/30/2019   MCV 90.0 04/30/2019   PLT 313 04/30/2019   Recent Labs    06/30/18 1208 04/26/19 1030 04/30/19 1203  NA 140 140 140  K 4.4 3.8 4.2  CL 102 103 105  CO2 32 31 26  GLUCOSE 124* 102* 121*  BUN 19 11 10   CREATININE 0.99 0.90 0.84  CALCIUM 9.2 9.6 9.6  GFRNONAA  --   --  >60  GFRAA  --   --  >60  PROT  --   --  7.8  ALBUMIN  --   --  4.0  AST  --   --  19  ALT  --   --  16  ALKPHOS  --   --  78  BILITOT  --   --  0.6   Iron/TIBC/Ferritin/ %Sat No results found for: IRON, TIBC, FERRITIN, IRONPCTSAT    RADIOGRAPHIC STUDIES: I have personally reviewed the radiological images as listed and agreed with the findings in the report.  US Venous Img Lower Unilateral Right  Result Date: 04/27/2019 CLINICAL DATA:  Right leg swelling, elevated D-dimer EXAM: RIGHT LOWER EXTREMITY VENOUS DOPPLER ULTRASOUND TECHNIQUE: Gray-scale sonography with graded compression, as well as color Doppler and duplex ultrasound were performed to evaluate the lower extremity deep venous systems from the level of the common femoral vein and including the common femoral, femoral, profunda femoral, popliteal and calf veins including the posterior tibial, peroneal and gastrocnemius veins when visible. The superficial great saphenous vein was also interrogated. Spectral Doppler was utilized to evaluate flow at rest and with distal augmentation maneuvers in the common femoral, femoral and popliteal veins. COMPARISON:  None.  FINDINGS: Contralateral Common Femoral Vein: Respiratory phasicity is normal and symmetric with the symptomatic side. No evidence of thrombus. Normal compressibility. Common Femoral Vein: No evidence of thrombus. Normal compressibility, respiratory phasicity and response to augmentation. Saphenofemoral Junction: No evidence of thrombus. Normal compressibility and flow on color Doppler imaging. Profunda Femoral Vein: No evidence of thrombus. Normal compressibility and flow on color Doppler imaging. Femoral Vein: Right distal femoral vein demonstrates hypoechoic intraluminal thrombus. Vessel is noncompressible. Thrombus is nearly occlusive. Some phasic flow demonstrated. Popliteal Vein: Similar hypoechoic thrombus. Vessel is noncompressible. Thrombus nearly occlusive. Calf Veins: Popliteal thrombus does appear to extend into the tibial veins. Peroneal veins not well demonstrated. Other Findings: Popliteal fossa Baker's cyst measures 5.1 x 1.1 x 2.5 cm. IMPRESSION: Positive exam for right distal femoropopliteal DVT extending into the tibial calf veins as above. 5.1 cm right Baker's cyst. These results will be called to the ordering clinician or representative by the Radiologist Assistant, and communication documented in the PACS or zVision Dashboard. Electronically Signed  By: Eugenie Filler M.D.   On: 04/27/2019 14:36      ASSESSMENT & PLAN:  1. Acute deep vein thrombosis (DVT) of popliteal vein of right lower extremity (HCC)   2. Former smoker   3. Cancer screening    Images were independently reviewed by me and discussed with patient.   Reviewed patient's previous medical history. She has no obvious triggering factors/immobilization factors. She did have Covid 19 infection 2-1/2 months prior to the onset of acute DVT, probably not directly related to the event. I think this is an unprovoked event.  Patient does have incidental lifestyle, obesity which can contribute to hypercoagulable state. I recommend  long-term anticoagulation.  Patient has been started on Xarelto and currently is on 50 mg twice daily.  Her symptoms has improved.  I discussed that it may take 4 to 6 weeks for her extremity to return to close to her baseline.  She may have lower extremity insufficiency as well. I will check prothrombin gene mutation and factor V Leiden mutation I will hold other hypercoagulable work-up at this point during the acute setting.  Age-appropriate cancer screening, patient is overdue for colonoscopy.  She desires bleeding events or change of bowel habits.  Recommend patient to start colon cancer screening. She is a former smoker, 64-pack-year smoking history.  Will qualify for lung cancer screening program. She has CT scan this year.  I will refer her to lung cancer screening program in the future.  Orders Placed This Encounter  Procedures  . CBC with Differential    Standing Status:   Future    Number of Occurrences:   1    Standing Expiration Date:   04/29/2020  . Comprehensive metabolic panel    Standing Status:   Future    Number of Occurrences:   1    Standing Expiration Date:   04/29/2020  . Prothrombin gene mutation    Standing Status:   Future    Number of Occurrences:   1    Standing Expiration Date:   04/29/2020  . Factor 5 leiden    Standing Status:   Future    Number of Occurrences:   1    Standing Expiration Date:   04/29/2020  . CBC with Differential    Standing Status:   Future    Standing Expiration Date:   04/29/2020  . Comprehensive metabolic panel    Standing Status:   Future    Standing Expiration Date:   04/29/2020    All questions were answered. The patient knows to call the clinic with any problems questions or concerns.  cc [name], NP    Return of visit: 3 months. Thank you for this kind referral and the opportunity to participate in the care of this patient. A copy of today's note is routed to referring provider    Earlie Server, MD, PhD Hematology  Oncology [facility] at [facility] Pager- [phone] 04/30/2019

## 2019-05-04 LAB — FACTOR 5 LEIDEN

## 2019-05-05 LAB — PROTHROMBIN GENE MUTATION

## 2019-05-13 ENCOUNTER — Other Ambulatory Visit: Payer: Self-pay | Admitting: Internal Medicine

## 2019-06-08 DIAGNOSIS — M150 Primary generalized (osteo)arthritis: Secondary | ICD-10-CM | POA: Diagnosis not present

## 2019-06-08 DIAGNOSIS — L409 Psoriasis, unspecified: Secondary | ICD-10-CM | POA: Diagnosis not present

## 2019-06-08 DIAGNOSIS — L4050 Arthropathic psoriasis, unspecified: Secondary | ICD-10-CM | POA: Diagnosis not present

## 2019-06-08 DIAGNOSIS — I5189 Other ill-defined heart diseases: Secondary | ICD-10-CM | POA: Diagnosis not present

## 2019-06-14 ENCOUNTER — Encounter: Payer: Self-pay | Admitting: Oncology

## 2019-07-22 ENCOUNTER — Telehealth: Payer: Self-pay | Admitting: Oncology

## 2019-07-22 NOTE — Telephone Encounter (Signed)
Patient phoned and stated that she did not have insurance and was currently working on Arts development officer or Medicaid. Patient cancelled appts at the [facility] scheduled for 07-27-19 and will phone back at a later time to reschedule.

## 2019-07-27 ENCOUNTER — Inpatient Hospital Stay: Payer: Self-pay

## 2019-07-27 ENCOUNTER — Inpatient Hospital Stay: Payer: Self-pay | Admitting: Oncology

## 2019-08-24 ENCOUNTER — Encounter: Payer: Self-pay | Admitting: Internal Medicine

## 2019-08-24 DIAGNOSIS — I82431 Acute embolism and thrombosis of right popliteal vein: Secondary | ICD-10-CM

## 2019-08-24 NOTE — Telephone Encounter (Signed)
Ok to refill meds. Can you print forms and place in my inbox?

## 2019-08-25 MED ORDER — SIMVASTATIN 10 MG PO TABS: 10.0000 mg | ORAL_TABLET | Freq: Every day | ORAL | 0 refills | Status: DC

## 2019-08-25 MED ORDER — GLIPIZIDE ER 2.5 MG PO TB24: 2.5000 mg | ORAL_TABLET | Freq: Every day | ORAL | 0 refills | Status: DC

## 2019-08-25 MED ORDER — LOSARTAN POTASSIUM 50 MG PO TABS: 50.0000 mg | ORAL_TABLET | Freq: Every day | ORAL | 0 refills | Status: DC

## 2019-08-25 MED ORDER — FUROSEMIDE 40 MG PO TABS: 40.0000 mg | ORAL_TABLET | Freq: Every day | ORAL | 0 refills | Status: DC

## 2019-08-25 NOTE — Telephone Encounter (Signed)
Done, placed in MYD box 

## 2019-08-25 NOTE — Telephone Encounter (Signed)
Forms placed in your box

## 2019-08-30 ENCOUNTER — Encounter: Payer: Self-pay | Admitting: Internal Medicine

## 2019-08-30 MED ORDER — RIVAROXABAN 20 MG PO TABS: 20.0000 mg | ORAL_TABLET | Freq: Every day | ORAL | 0 refills | Status: DC

## 2019-08-30 MED ORDER — RIVAROXABAN 20 MG PO TABS: 20.0000 mg | ORAL_TABLET | Freq: Every day | ORAL | 2 refills | Status: DC

## 2019-08-30 NOTE — Telephone Encounter (Signed)
PPW faxed to [employer] patient assistance

## 2019-10-06 ENCOUNTER — Telehealth: Payer: Self-pay | Admitting: Internal Medicine

## 2019-10-06 DIAGNOSIS — I82431 Acute embolism and thrombosis of right popliteal vein: Secondary | ICD-10-CM

## 2019-10-06 MED ORDER — RIVAROXABAN 20 MG PO TABS: 20.0000 mg | ORAL_TABLET | Freq: Every day | ORAL | 1 refills | Status: DC

## 2019-10-06 NOTE — Telephone Encounter (Signed)
Patient is requesting a call back  She stated that she is needing a weeks worth of medication sent to her pharmacy She is still waiting to hear if she has been approved by Wynetta Emery and [employer]etta Emery to see if she is approved to receive assistance for this medication   Patient stated she has 1 tablet left

## 2019-10-06 NOTE — Addendum Note (Signed)
Addended by: Lurlean Nanny on: 10/06/2019 11:09 AM   Modules accepted: Orders

## 2019-10-18 MED ORDER — RIVAROXABAN 20 MG PO TABS: 20.0000 mg | ORAL_TABLET | Freq: Every day | ORAL | 1 refills | Status: DC

## 2019-10-18 NOTE — Addendum Note (Signed)
Addended by: Lurlean Nanny on: 10/18/2019 03:12 PM   Modules accepted: Orders

## 2019-10-18 NOTE — Telephone Encounter (Signed)
received notification from [place] patient assistance that pt was approved... Rx sent through e-scribe  pt is aware

## 2019-11-28 ENCOUNTER — Other Ambulatory Visit: Payer: Self-pay | Admitting: Internal Medicine

## 2019-12-09 ENCOUNTER — Encounter: Payer: Self-pay | Admitting: Internal Medicine

## 2019-12-21 ENCOUNTER — Ambulatory Visit: Payer: Self-pay | Admitting: Internal Medicine

## 2019-12-21 ENCOUNTER — Encounter: Payer: Self-pay | Admitting: Internal Medicine

## 2019-12-21 ENCOUNTER — Other Ambulatory Visit: Payer: Self-pay

## 2019-12-21 VITALS — BP 142/84 | HR 84 | Temp 97.4°F | Wt 266.0 lb

## 2019-12-21 DIAGNOSIS — L719 Rosacea, unspecified: Secondary | ICD-10-CM

## 2019-12-21 DIAGNOSIS — E119 Type 2 diabetes mellitus without complications: Secondary | ICD-10-CM

## 2019-12-21 DIAGNOSIS — L4050 Arthropathic psoriasis, unspecified: Secondary | ICD-10-CM

## 2019-12-21 DIAGNOSIS — I10 Essential (primary) hypertension: Secondary | ICD-10-CM

## 2019-12-21 DIAGNOSIS — C541 Malignant neoplasm of endometrium: Secondary | ICD-10-CM

## 2019-12-21 DIAGNOSIS — E0843 Diabetes 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 autonomic (poly)neuropathy: Secondary | ICD-10-CM | POA: Insufficient documentation

## 2019-12-21 DIAGNOSIS — K219 Gastro-esophageal reflux disease without esophagitis: Secondary | ICD-10-CM

## 2019-12-21 DIAGNOSIS — I5032 Chronic diastolic (congestive) heart failure: Secondary | ICD-10-CM

## 2019-12-21 DIAGNOSIS — Z86718 Personal history of other venous thrombosis and embolism: Secondary | ICD-10-CM | POA: Insufficient documentation

## 2019-12-21 DIAGNOSIS — E7800 Pure hypercholesterolemia, unspecified: Secondary | ICD-10-CM

## 2019-12-21 LAB — LIPID PANEL
Cholesterol: 170 mg/dL (ref 0–200)
HDL: 53.1 mg/dL (ref >39.00)
LDL Cholesterol: 78 mg/dL (ref 0–99)
NonHDL: 117.39
Total CHOL/HDL Ratio: 3
Triglycerides: 195 mg/dL — ABNORMAL HIGH (ref 0.0–149.0)
VLDL: 39 mg/dL (ref 0.0–40.0)

## 2019-12-21 LAB — CBC
HCT: 37.1 % (ref 36.0–46.0)
Hemoglobin: 12.2 g/dL (ref 12.0–15.0)
MCHC: 33 g/dL (ref 30.0–36.0)
MCV: 89.6 fl (ref 78.0–100.0)
Platelets: 336 10*3/uL (ref 150.0–400.0)
RBC: 4.14 Mil/uL (ref 3.87–5.11)
RDW: 16.5 % — ABNORMAL HIGH (ref 11.5–15.5)
WBC: 8.2 10*3/uL (ref 4.0–10.5)

## 2019-12-21 LAB — COMPREHENSIVE METABOLIC PANEL
ALT: 20 U/L (ref 0–35)
AST: 20 U/L (ref 0–37)
Albumin: 4 g/dL (ref 3.5–5.2)
Alkaline Phosphatase: 82 U/L (ref 39–117)
BUN: 14 mg/dL (ref 6–23)
CO2: 33 mEq/L — ABNORMAL HIGH (ref 19–32)
Calcium: 9.4 mg/dL (ref 8.4–10.5)
Chloride: 102 mEq/L (ref 96–112)
Creatinine, Ser: 1.04 mg/dL (ref 0.40–1.20)
GFR: 53.4 mL/min — ABNORMAL LOW (ref >60.00)
Glucose, Bld: 191 mg/dL — ABNORMAL HIGH (ref 70–99)
Potassium: 4.4 mEq/L (ref 3.5–5.1)
Sodium: 140 mEq/L (ref 135–145)
Total Bilirubin: 0.4 mg/dL (ref 0.2–1.2)
Total Protein: 6.9 g/dL (ref 6.0–8.3)

## 2019-12-21 LAB — HEMOGLOBIN A1C: Hgb A1c MFr Bld: 7.5 % — ABNORMAL HIGH (ref 4.6–6.5)

## 2019-12-21 NOTE — Assessment & Plan Note (Signed)
Lifelong Xarelto CBC today

## 2019-12-21 NOTE — Patient Instructions (Signed)
Carbohydrate Counting for Diabetes Mellitus, Adult  Carbohydrate counting is a method of keeping track of how many carbohydrates you eat. Eating carbohydrates naturally increases the amount of sugar (glucose) in the blood. Counting how many carbohydrates you eat helps keep your blood glucose within normal limits, which helps you manage your diabetes (diabetes mellitus). It is important to know how many carbohydrates you can safely have in each meal. This is different for every person. A diet and nutrition specialist (registered dietitian) can help you make a meal plan and calculate how many carbohydrates you should have at each meal and snack. Carbohydrates are found in the following foods:  Grains, such as breads and cereals.  Dried beans and soy products.  Starchy vegetables, such as potatoes, peas, and corn.  Fruit and fruit juices.  Milk and yogurt.  Sweets and snack foods, such as cake, cookies, candy, chips, and soft drinks. How do I count carbohydrates? There are two ways to count carbohydrates in food. You can use either of the methods or a combination of both. Reading "Nutrition Facts" on packaged food The "Nutrition Facts" list is included on the labels of almost all packaged foods and beverages in the [place] It includes:  The serving size.  Information about nutrients in each serving, including the grams (g) of carbohydrate per serving. To use the "Nutrition Facts":  Decide how many servings you will have.  Multiply the number of servings by the number of carbohydrates per serving.  The resulting number is the total amount of carbohydrates that you will be having. Learning standard serving sizes of other foods When you eat carbohydrate foods that are not packaged or do not include "Nutrition Facts" on the label, you need to measure the servings in order to count the amount of carbohydrates:  Measure the foods that you will eat with a food scale or measuring cup, if  needed.  Decide how many standard-size servings you will eat.  Multiply the number of servings by 15. Most carbohydrate-rich foods have about 15 g of carbohydrates per serving. ? For example, if you eat 8 oz (170 g) of strawberries, you will have eaten 2 servings and 30 g of carbohydrates (2 servings x 15 g = 30 g).  For foods that have more than one food mixed, such as soups and casseroles, you must count the carbohydrates in each food that is included. The following list contains standard serving sizes of common carbohydrate-rich foods. Each of these servings has about 15 g of carbohydrates:   hamburger bun or  English muffin.   oz (15 mL) syrup.   oz (14 g) jelly.  1 slice of bread.  1 six-inch tortilla.  3 oz (85 g) cooked rice or pasta.  4 oz (113 g) cooked dried beans.  4 oz (113 g) starchy vegetable, such as peas, corn, or potatoes.  4 oz (113 g) hot cereal.  4 oz (113 g) mashed potatoes or  of a large baked potato.  4 oz (113 g) canned or frozen fruit.  4 oz (120 mL) fruit juice.  4-6 crackers.  6 chicken nuggets.  6 oz (170 g) unsweetened dry cereal.  6 oz (170 g) plain fat-free yogurt or yogurt sweetened with artificial sweeteners.  8 oz (240 mL) milk.  8 oz (170 g) fresh fruit or one small piece of fruit.  24 oz (680 g) popped popcorn. Example of carbohydrate counting Sample meal  3 oz (85 g) chicken breast.  6 oz (170 g)   brown rice.  4 oz (113 g) corn.  8 oz (240 mL) milk.  8 oz (170 g) strawberries with sugar-free whipped topping. Carbohydrate calculation 1. Identify the foods that contain carbohydrates: ? Rice. ? Corn. ? Milk. ? Strawberries. 2. Calculate how many servings you have of each food: ? 2 servings rice. ? 1 serving corn. ? 1 serving milk. ? 1 serving strawberries. 3. Multiply each number of servings by 15 g: ? 2 servings rice x 15 g = 30 g. ? 1 serving corn x 15 g = 15 g. ? 1 serving milk x 15 g = 15 g. ? 1  serving strawberries x 15 g = 15 g. 4. Add together all of the amounts to find the total grams of carbohydrates eaten: ? 30 g + 15 g + 15 g + 15 g = 75 g of carbohydrates total. Summary  Carbohydrate counting is a method of keeping track of how many carbohydrates you eat.  Eating carbohydrates naturally increases the amount of sugar (glucose) in the blood.  Counting how many carbohydrates you eat helps keep your blood glucose within normal limits, which helps you manage your diabetes.  A diet and nutrition specialist (registered dietitian) can help you make a meal plan and calculate how many carbohydrates you should have at each meal and snack. This information is not intended to replace advice given to you by your health care provider. Make sure you discuss any questions you have with your health care provider. Document Revised: 11/21/2016 Document Reviewed: 10/11/2015 Elsevier Patient Education  2020 Elsevier Inc.  

## 2019-12-21 NOTE — Assessment & Plan Note (Signed)
Elevated today but she was rushing around Continue Losartan and Furosemide Encouraged DASH diet and exercise for weight loss C met today We will monitor

## 2019-12-21 NOTE — Assessment & Plan Note (Addendum)
A1c today No urine microalbumin secondary to ARB therapy Encouraged her to consume a low carb diet and exercise for weight loss Encourage routine foot exams Encourage routine eye exams Continue Glipizide and Gabapentin

## 2019-12-21 NOTE — Assessment & Plan Note (Signed)
Continue Losartan and Furosemide Encouraged DASH diet and exercise for weight loss She will continue to follow with cardiology

## 2019-12-21 NOTE — Assessment & Plan Note (Signed)
C met and lipid profile today °Encouraged her to consume a low fat diet °Continue Simvastatin °

## 2019-12-21 NOTE — Assessment & Plan Note (Signed)
Continue Methotrexate, Gabapentin and Otezla C met today She will continue to follow with rheumatology 

## 2019-12-21 NOTE — Progress Notes (Signed)
Subjective:    Patient ID: Melissa Cabrera, female    DOB: 01/12/1956, 64 y.o.   MRN: [id number]  HPI  Patient presents the clinic today for follow-up of chronic conditions.  CHF, Diastolic: She reports persistent lower extremity edema and shortness of breath.  She is taking Losartan and Furosemide as prescribed.  Echo from 07/2017 reviewed.  She follows with cardiology.  HTN: Her BP today is 142/84.  She is taking Losartan and Furosemide as prescribed.  ECG from 11/2018 reviewed.  History of Endometrial Cancer: Status post hysterectomy.  She follows with Dr. Amalia Hailey and Dr. Fransisca Connors.  GERD: She denies breakthrough on Omeprazole.  There is no upper GI on file.  HLD: Her last LDL was 97.  She denies myalgias on Simvastatin.  She does not consume a low-fat diet.  Psoriatic Arthritis: Managed with Methotrexate, Gabapentin and Otezla.  She follows with rheumatology  DM 2: Her last A1c was 5.9, 11/2017. She is taking Glipizide as prescribed.  She is not monitoring her sugars.  She checks her feet routinely.  She is taking Gabapentin for neuropathic pain but reports is not that effective.  Her last eye exam was.  She declines all vaccines.  [place].  Managed with doxycycline.  She follows with dermatology.  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 She has seen hematology in the past.  Review of Systems      Past Medical History:  Diagnosis Date  . Allergy   . Arthritis   . Cancer (HCC)    endometrial  . Chicken pox   . Diabetes mellitus without complication ([place])   . GERD (gastroesophageal reflux disease)   . Heart murmur    DUE TO RHEUMATIC FEVER PER PT  . Hyperlipidemia   . Hypertension   . Psoriasis   . Rheumatic fever   . Rosacea     Current Outpatient Medications  Medication Sig Dispense Refill  . albuterol (PROVENTIL HFA;VENTOLIN HFA) 108 (90 Base) MCG/ACT inhaler Inhale 2 puffs into the lungs every 6 (six) hours as needed for wheezing or shortness of breath. 1 Inhaler  2  . Apremilast (OTEZLA) 30 MG TABS Take 1 tablet by mouth 2 (two) times daily.     . betamethasone dipropionate (DIPROLENE) 0.05 % ointment Apply 1 application topically 2 (two) times daily as needed. psoriasis     . calcium carbonate (TUMS EX) 750 MG chewable tablet Chew 750 mg by mouth daily.    . cetirizine (ZYRTEC) 10 MG tablet Take 10 mg by mouth at bedtime.     . Cholecalciferol (VITAMIN D3) 400 UNITS CAPS Take 400 Units by mouth daily.     . clotrimazole-betamethasone (LOTRISONE) cream APPLY TOPICALLY AS DIRECTED TWICE DAILY (Patient taking differently: Apply 1 application topically 2 (two) times daily as needed (BACTERIAL INFECTION). APPLY TOPICALLY AS DIRECTED TWICE DAILY) 60 g 5  . diphenhydrAMINE (BENADRYL) 25 mg capsule Take 50 mg by mouth at bedtime.     Marland Kitchen doxycycline (ADOXA) 100 MG tablet Take 50 mg by mouth daily.    . fluocinonide ointment (LIDEX) 1.66 % Apply 1 application topically 2 (two) times daily as needed (PSORIASIS BEHIND EARS).     . fluticasone (FLONASE) 50 MCG/ACT nasal spray USE 1 SPRAY INTO BOTH  NOSTRILS AT BEDTIME. 48 g 0  . folic acid (FOLVITE) 1 MG tablet Take 1 mg by mouth daily.    . furosemide (LASIX) 40 MG tablet Take 1 tablet by mouth once daily 90 tablet 0  .  gabapentin (NEURONTIN) 300 MG capsule TAKE 2 CAPSULES THREE TIMES A DAY 540 capsule 0  . glipiZIDE (GLUCOTROL XL) 2.5 MG 24 hr tablet Take 1 tablet (2.5 mg total) by mouth daily with breakfast. 90 tablet 0  . ketoconazole (NIZORAL) 2 % shampoo Apply 1 application topically 2 (two) times a week.    . losartan (COZAAR) 50 MG tablet Take 1 tablet (50 mg total) by mouth daily. 90 tablet 0  . methotrexate (RHEUMATREX) 2.5 MG tablet Take 10 mg by mouth every Friday. Caution:Chemotherapy. Protect from light.     . NONFORMULARY OR COMPOUNDED ITEM See pharmacy note 120 each 2  . nystatin ointment (MYCOSTATIN) Apply 1 application topically 2 (two) times daily.    . Omega-3 Fatty Acids (FISH OIL PO) Take 1  capsule by mouth 2 (two) times daily. 1040 mg of fish oil in each capsule    . omeprazole (PRILOSEC) 40 MG capsule Take 1 capsule (40 mg total) by mouth daily. 90 capsule 0  . rivaroxaban (XARELTO) 20 MG TABS tablet Take 1 tablet (20 mg total) by mouth daily with supper. 30 tablet 1  . simvastatin (ZOCOR) 10 MG tablet Take 1 tablet (10 mg total) by mouth daily. 90 tablet 0  . SOOLANTRA 1 % CREA APPLY TO THE FACE EVERY DAY  6  . vitamin C (ASCORBIC ACID) 500 MG tablet Take 1,000 mg by mouth daily.      No current facility-administered medications for this visit.    No Known Allergies  Family History  Problem Relation Age of Onset  . Cancer Mother        skin  . Dementia Mother   . Cancer Sister        skin and liver  . Stroke Maternal Grandmother   . Skin cancer Brother   . Diabetes Neg Hx   . Heart disease Neg Hx   . Breast cancer Neg Hx     Social History   Socioeconomic History  . Marital status: Married    Spouse name: Not on file  . Number of children: Not on file  . Years of education: Not on file  . Highest education level: Not on file  Occupational History  . Not on file  Tobacco Use  . Smoking status: Former Smoker    Packs/day: 4.00    Years: 16.00    Pack years: 64.00    Types: Cigarettes    Quit date: 09/12/1995    Years since quitting: 24.2  . Smokeless tobacco: Never Used  . Tobacco comment: quit in 1997  Vaping Use  . Vaping Use: Never used  Substance and Sexual Activity  . Alcohol use: No    Alcohol/week: 0.0 standard drinks  . Drug use: No  . Sexual activity: Yes    Birth control/protection: None  Other Topics Concern  . Not on file  Social History Narrative  . Not on file   Social Determinants of Health   Financial Resource Strain:   . Difficulty of Paying Living Expenses:   Food Insecurity:   . Worried About Charity fundraiser in the Last Year:   . Arboriculturist in the Last Year:   Transportation Needs:   . Film/video editor  (Medical):   Marland Kitchen Lack of Transportation (Non-Medical):   Physical Activity:   . Days of Exercise per Week:   . Minutes of Exercise per Session:   Stress:   . Feeling of Stress :   Social  Connections:   . Frequency of Communication with Friends and Family:   . Frequency of Social Gatherings with Friends and Family:   . Attends Religious Services:   . Active Member of Clubs or Organizations:   . Attends Archivist Meetings:   Marland Kitchen Marital Status:   Intimate Partner Violence:   . Fear of Current or Ex-Partner:   . Emotionally Abused:   Marland Kitchen Physically Abused:   . Sexually Abused:      Constitutional: Denies fever, malaise, fatigue, headache or abrupt weight changes.  HEENT: Denies eye pain, eye redness, ear pain, ringing in the ears, wax buildup, runny nose, nasal congestion, bloody nose, or sore throat. Respiratory: Pt reports shortness of breath. Denies difficulty breathing, cough or sputum production.   Cardiovascular: Pt reports swelling in legs. Denies chest pain, chest tightness, palpitations or swelling in the hands.  Gastrointestinal: Denies abdominal pain, bloating, constipation, diarrhea or blood in the stool.  GU: Denies urgency, frequency, pain with urination, burning sensation, blood in urine, odor or discharge. Musculoskeletal: Pt reports pain in bilateral feet. Denies decrease in range of motion, difficulty with gait, muscle pain or joint pain.  Skin: Pt reports rash of face, bruising of arms. Denies redness, rashes, lesions or ulcercations.  Neurological: Denies dizziness, difficulty with memory, difficulty with speech or problems with balance and coordination.  Psych: Denies anxiety, depression, SI/HI.  No other specific complaints in a complete review of systems (except as listed in HPI above).  Objective:   Physical Exam  BP (!) 142/84   Pulse 84   Temp (!) 97.4 F (36.3 C) (Temporal)   Wt 266 lb (120.7 kg)   SpO2 98%   BMI 51.95 kg/m   Wt Readings  from Last 3 Encounters:  04/30/19 244 lb 4.8 oz (110.8 kg)  04/26/19 241 lb (109.3 kg)  01/28/19 235 lb (106.6 kg)    General: Appears her stated age, obese, in NAD. Skin: Warm, dry and intact. No ulcerations noted. HEENT: Head: normal shape and size; Eyes: sclera white, no icterus, conjunctiva pink, PERRLA and EOMs intact;  Cardiovascular: Normal rate and rhythm. S1,S2 noted.  No murmur, rubs or gallops noted. No JVD or BLE edema. No carotid bruits noted. Pulmonary/Chest: Normal effort and positive vesicular breath sounds. No respiratory distress. No wheezes, rales or ronchi noted.  Abdomen: Soft and nontender. Normal bowel sounds.  Musculoskeletal:  No difficulty with gait.  Neurological: Alert and oriented.  Psychiatric: Mood and affect normal. Behavior is normal. Judgment and thought content normal.     BMET    Component Value Date/Time   NA 140 04/30/2019 1203   NA 143 01/07/2014 0000   K 4.2 04/30/2019 1203   CL 105 04/30/2019 1203   CO2 26 04/30/2019 1203   GLUCOSE 121 (H) 04/30/2019 1203   BUN 10 04/30/2019 1203   CREATININE 0.84 04/30/2019 1203   CALCIUM 9.6 04/30/2019 1203   GFRNONAA >60 04/30/2019 1203   GFRAA >60 04/30/2019 1203    Lipid Panel     Component Value Date/Time   CHOL 181 02/04/2017 1007   TRIG 116.0 02/04/2017 1007   HDL 60.50 02/04/2017 1007   CHOLHDL 3 02/04/2017 1007   VLDL 23.2 02/04/2017 1007   LDLCALC 97 02/04/2017 1007    CBC    Component Value Date/Time   WBC 6.4 04/30/2019 1203   RBC 4.31 04/30/2019 1203   HGB 12.3 04/30/2019 1203   HGB 14.5 08/16/2016 1200   HCT 38.8 04/30/2019 1203  HCT 42.8 08/16/2016 1200   PLT 313 04/30/2019 1203   PLT 297 08/16/2016 1200   MCV 90.0 04/30/2019 1203   MCV 86 08/16/2016 1200   MCH 28.5 04/30/2019 1203   MCHC 31.7 04/30/2019 1203   RDW 15.0 04/30/2019 1203   RDW 13.5 08/16/2016 1200   LYMPHSABS 2.1 04/30/2019 1203   MONOABS 0.6 04/30/2019 1203   EOSABS 0.2 04/30/2019 1203   BASOSABS  0.0 04/30/2019 1203    Hgb A1C Lab Results  Component Value Date   HGBA1C 5.9 11/21/2017            Assessment & Plan:    Webb Silversmith, NP This visit occurred during the SARS-CoV-2 public health emergency.  Safety protocols were in place, including screening questions prior to the visit, additional usage of staff PPE, and extensive cleaning of exam room while observing appropriate contact time as indicated for disinfecting solutions.

## 2019-12-21 NOTE — Assessment & Plan Note (Signed)
In remission She will continue to follow with GYN

## 2019-12-21 NOTE — Assessment & Plan Note (Signed)
Continue Doxycycline She will continue to follow with Derm

## 2019-12-21 NOTE — Assessment & Plan Note (Signed)
CBC and C met today Continue Omeprazole 

## 2019-12-24 ENCOUNTER — Encounter: Payer: Self-pay | Admitting: Internal Medicine

## 2019-12-24 MED ORDER — GABAPENTIN 300 MG PO CAPS: 600.0000 mg | ORAL_CAPSULE | Freq: Three times a day (TID) | ORAL | 0 refills | Status: DC

## 2019-12-24 MED ORDER — LOSARTAN POTASSIUM 50 MG PO TABS: 50.0000 mg | ORAL_TABLET | Freq: Every day | ORAL | 0 refills | Status: DC

## 2019-12-24 MED ORDER — GLIPIZIDE ER 5 MG PO TB24: 5.0000 mg | ORAL_TABLET | Freq: Every day | ORAL | 0 refills | Status: DC

## 2019-12-25 ENCOUNTER — Encounter: Payer: Self-pay | Admitting: Internal Medicine

## 2019-12-26 MED ORDER — SIMVASTATIN 10 MG PO TABS: 10.0000 mg | ORAL_TABLET | Freq: Every day | ORAL | 0 refills | Status: DC

## 2020-01-03 ENCOUNTER — Other Ambulatory Visit: Payer: Self-pay | Admitting: Internal Medicine

## 2020-01-03 DIAGNOSIS — I82431 Acute embolism and thrombosis of right popliteal vein: Secondary | ICD-10-CM

## 2020-02-29 ENCOUNTER — Other Ambulatory Visit: Payer: Self-pay | Admitting: Internal Medicine

## 2020-03-22 ENCOUNTER — Other Ambulatory Visit: Payer: Self-pay | Admitting: Internal Medicine

## 2020-03-29 ENCOUNTER — Other Ambulatory Visit: Payer: Self-pay | Admitting: Internal Medicine

## 2020-04-23 IMAGING — MG DIGITAL SCREENING BILATERAL MAMMOGRAM WITH TOMO AND CAD
8 series · 8 of 24 positions shown · non-contrast
Comparison: Previous exam(s).

CLINICAL DATA: Screening.

EXAM:
DIGITAL SCREENING BILATERAL MAMMOGRAM WITH TOMO AND CAD

[R CC synth-2D]
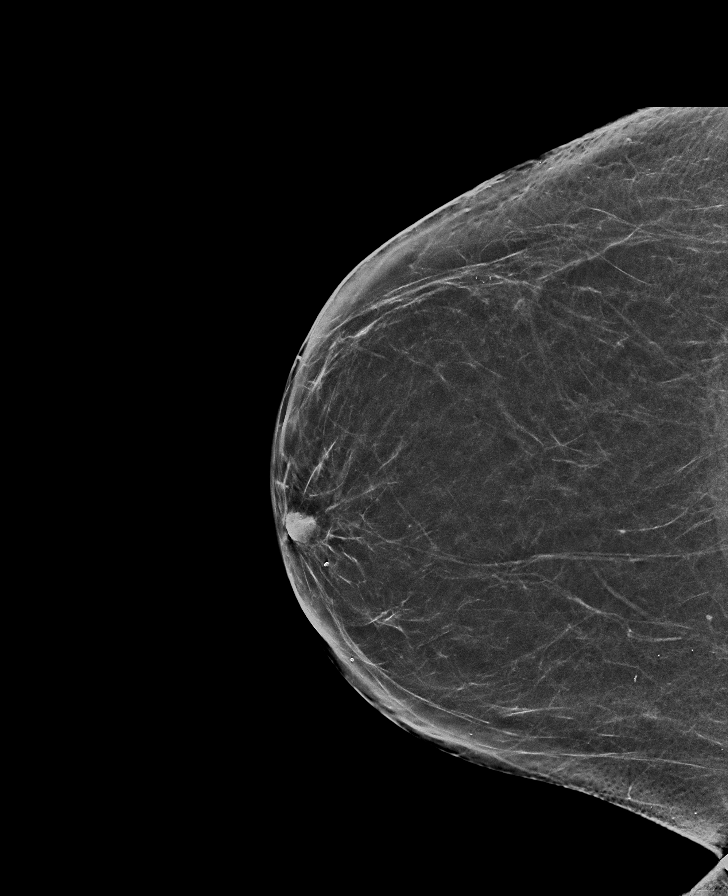

[L MLO synth-2D]
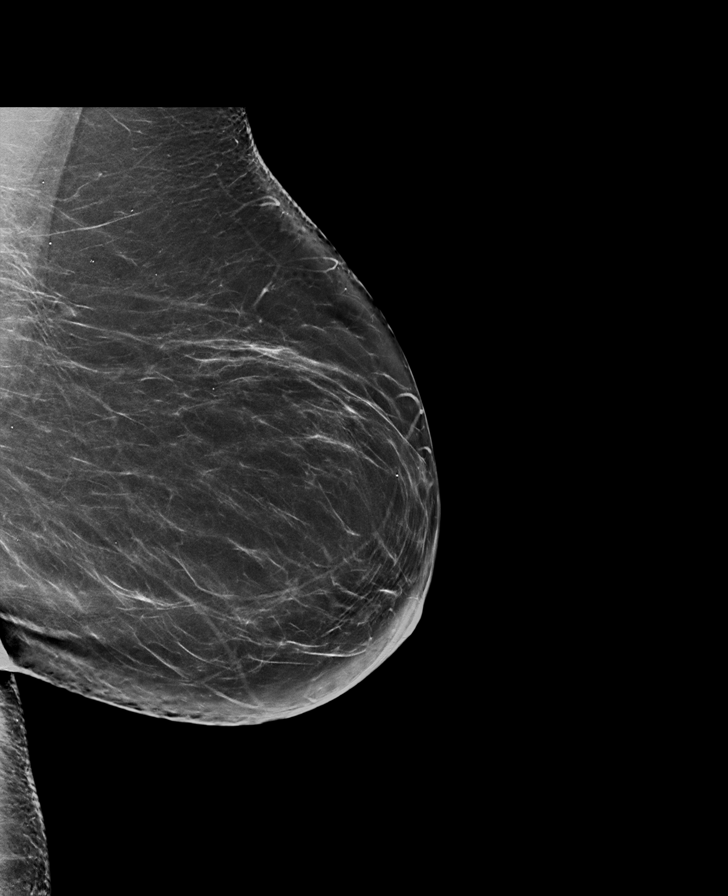

[L CC synth-2D]
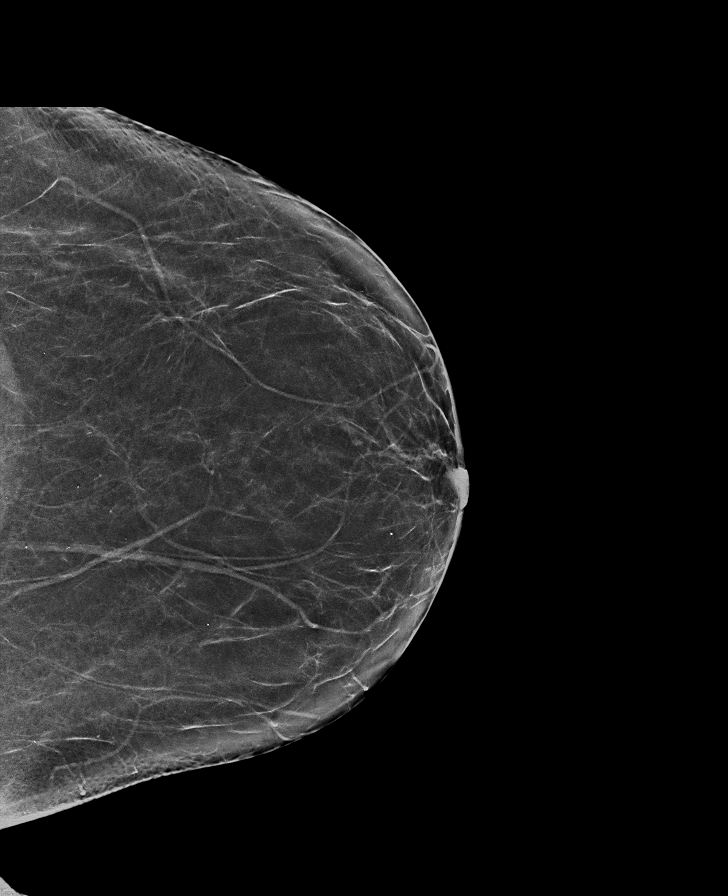

[R MLO synth-2D]
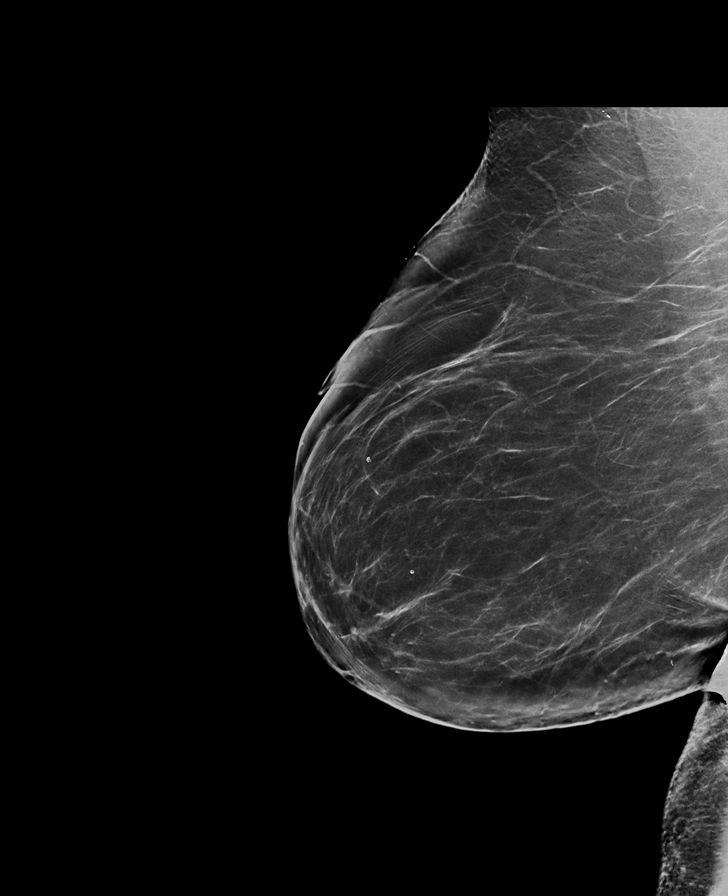

[R CC tomo · tomo slice 33/66.0]
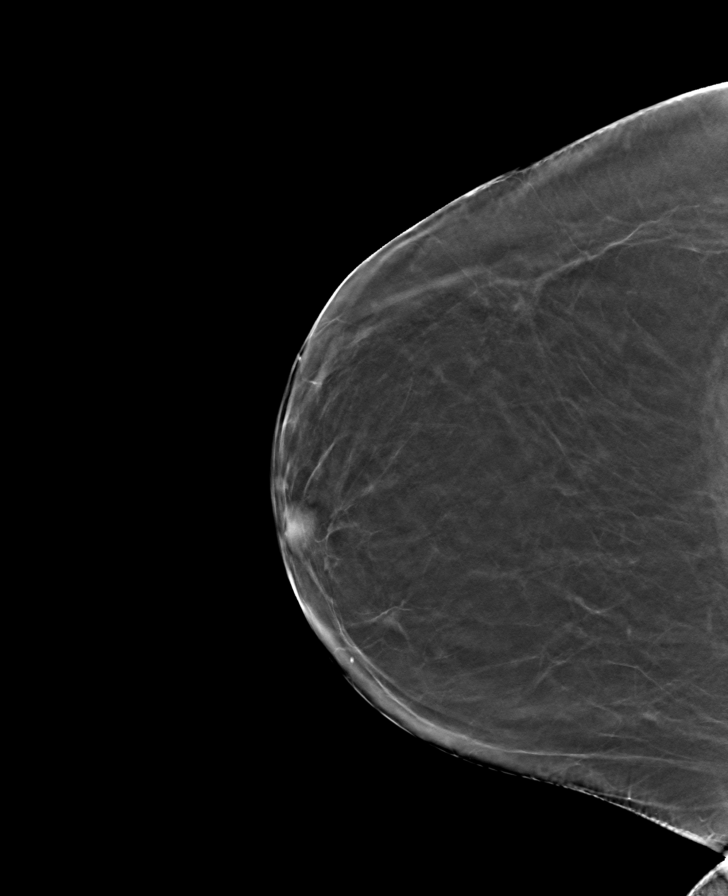

[R MLO tomo · tomo slice 42/83.0]
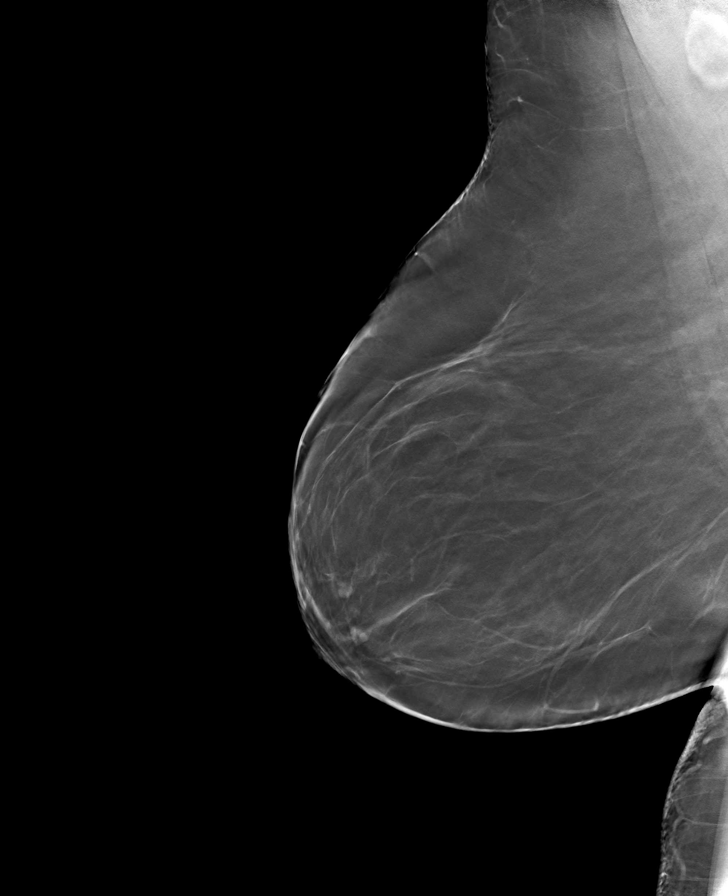

[L CC tomo · tomo slice 36/71.0]
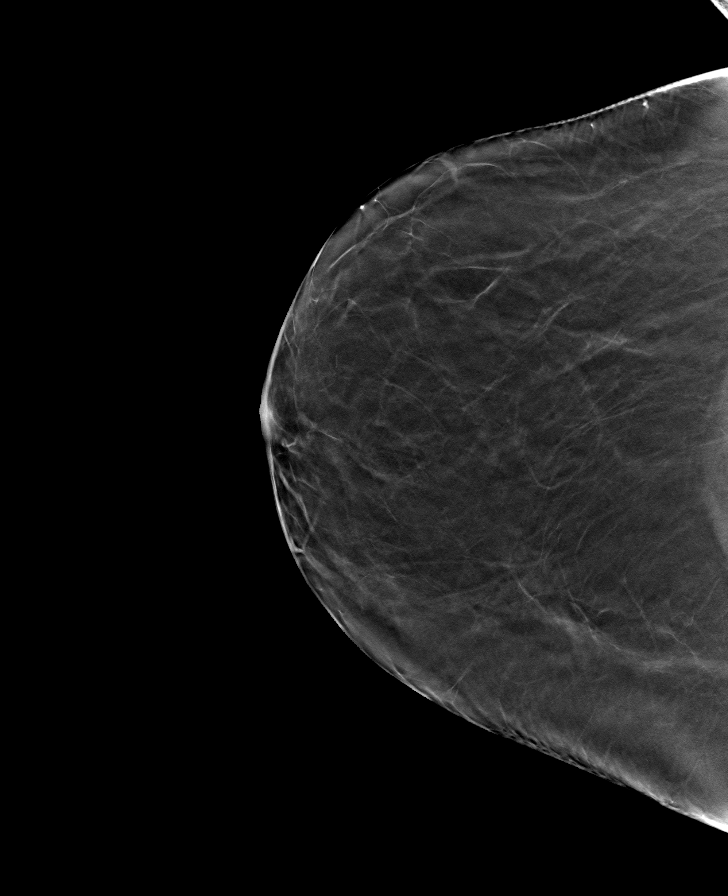

[L MLO tomo · tomo slice 43/84.0]
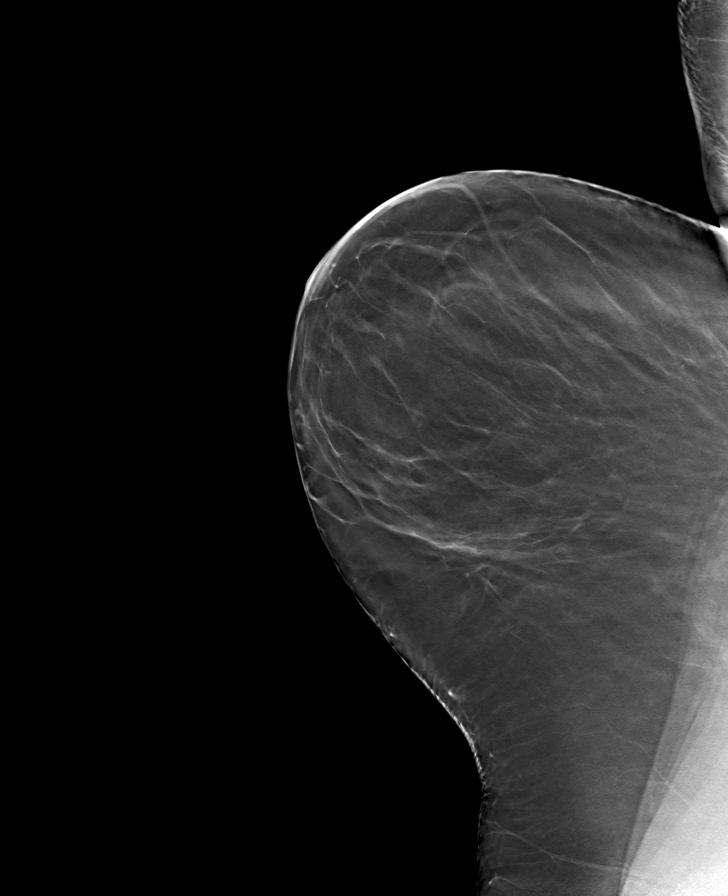

[8 of 24 positions shown; findings below may reference images not displayed]

ACR Breast Density Category b: There are scattered areas of
fibroglandular density.
FINDINGS: There are no findings suspicious for malignancy. Images were
processed with CAD.
IMPRESSION: No mammographic evidence of malignancy. A result letter of this
screening mammogram will be mailed directly to the patient.

RECOMMENDATION:
Screening mammogram in one year. (Code:[id number])

BI-RADS CATEGORY  1: Negative.

## 2020-06-21 ENCOUNTER — Other Ambulatory Visit: Payer: Self-pay | Admitting: Internal Medicine

## 2020-06-28 ENCOUNTER — Other Ambulatory Visit: Payer: Self-pay | Admitting: Internal Medicine

## 2020-06-28 DIAGNOSIS — I82431 Acute embolism and thrombosis of right popliteal vein: Secondary | ICD-10-CM

## 2020-08-21 ENCOUNTER — Encounter: Payer: Self-pay | Admitting: Internal Medicine

## 2020-08-21 MED ORDER — DIAZEPAM 2 MG PO TABS: 2.0000 mg | ORAL_TABLET | Freq: Four times a day (QID) | ORAL | 0 refills | Status: DC | PRN

## 2020-09-25 ENCOUNTER — Other Ambulatory Visit: Payer: Self-pay | Admitting: Internal Medicine

## 2020-09-27 ENCOUNTER — Other Ambulatory Visit: Payer: Self-pay

## 2020-09-27 ENCOUNTER — Telehealth: Payer: Self-pay

## 2020-09-27 ENCOUNTER — Ambulatory Visit: Payer: Self-pay | Admitting: Internal Medicine

## 2020-09-27 ENCOUNTER — Encounter: Payer: Self-pay | Admitting: Internal Medicine

## 2020-09-27 VITALS — BP 117/52 | HR 75 | Temp 97.2°F | Resp 18 | Ht 60.0 in | Wt 262.2 lb

## 2020-09-27 DIAGNOSIS — K219 Gastro-esophageal reflux disease without esophagitis: Secondary | ICD-10-CM

## 2020-09-27 DIAGNOSIS — L4050 Arthropathic psoriasis, unspecified: Secondary | ICD-10-CM

## 2020-09-27 DIAGNOSIS — I5032 Chronic diastolic (congestive) heart failure: Secondary | ICD-10-CM

## 2020-09-27 DIAGNOSIS — Z86718 Personal history of other venous thrombosis and embolism: Secondary | ICD-10-CM

## 2020-09-27 DIAGNOSIS — E0843 Diabetes 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 autonomic (poly)neuropathy: Secondary | ICD-10-CM

## 2020-09-27 DIAGNOSIS — E7800 Pure hypercholesterolemia, unspecified: Secondary | ICD-10-CM

## 2020-09-27 LAB — POCT GLYCOSYLATED HEMOGLOBIN (HGB A1C): Hemoglobin A1C: 7.9 % — AB (ref 4.0–5.6)

## 2020-09-27 MED ORDER — SIMVASTATIN 10 MG PO TABS: 10.0000 mg | ORAL_TABLET | Freq: Every day | ORAL | 1 refills | Status: DC

## 2020-09-27 MED ORDER — GABAPENTIN 300 MG PO CAPS: 2.0000 | ORAL_CAPSULE | Freq: Three times a day (TID) | ORAL | 1 refills | Status: DC

## 2020-09-27 MED ORDER — LOSARTAN POTASSIUM 50 MG PO TABS: 1.0000 | ORAL_TABLET | Freq: Every day | ORAL | 1 refills | Status: DC

## 2020-09-27 MED ORDER — GLIPIZIDE ER 5 MG PO TB24: 5.0000 mg | ORAL_TABLET | Freq: Every day | ORAL | 1 refills | Status: DC

## 2020-09-27 NOTE — Assessment & Plan Note (Signed)
Continue Omeprazole Avoid foods that trigger reflux Encouraged weight loss as this can help reduce reflux symptoms Will check CBC and c-Met at [facility] wellness annual exam

## 2020-09-27 NOTE — Assessment & Plan Note (Signed)
Continue Losartan and Furosemide Reinforced DASH diet and exercise weight loss Monitor daily weights Will not check be met at this time due to lack of insurance 

## 2020-09-27 NOTE — Assessment & Plan Note (Signed)
Continue Xarelto 

## 2020-09-27 NOTE — Assessment & Plan Note (Signed)
We will check c-Met and lipid profile with Medicare annual wellness exam Encouraged her to consume a low-fat diet Continue Simvastatin

## 2020-09-27 NOTE — Progress Notes (Signed)
Subjective:    Patient ID: Melissa Cabrera, female    DOB: 12-10-1955, 65 y.o.   MRN: [id number]  HPI  Patient presents the clinic today for follow-up of chronic conditions.  CHF, Diastolic: She reports persistent lower extremity edema and shortness of breath.  She is taking Losartan and Furosemide as prescribed.  Echo from 11/2018 reviewed.  She no longer follows with cardiology.  HTN: Her BP today is 117/52.  She is taking Losartan and Furosemide as prescribed.  ECG from 11/2018 reviewed.  GERD: She denies breakthrough on Omeprazole.  There is no upper GI on file.  HLD: Her last LDL was 78, triglycerides 195, 12/2019.  She denies myalgias on Simvastatin.  She does not consume a low-fat diet.  Psoriatic Arthritis: Managed with Methotrexate, Gabapentin, Tramadol and Otezla.  She follows with rheumatology.  DM2 with Peripheral Neuropathy: Her last A1c was 7.5%, 12/2019.  She is taking Glipizide as prescribed.  She is not monitoring her sugars.  She checks her feet routinely.  She is taking Gabapentin for neuropathic pain.  Her last eye exam was more than 1 year ago.  Flu never.  Pneumovax never.  COVID never.  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  She is not currently following with hematology.  Review of Systems      Past Medical History:  Diagnosis Date  . Allergy   . Arthritis   . Cancer (HCC)    endometrial  . Chicken pox   . Diabetes mellitus without complication ([place])   . GERD (gastroesophageal reflux disease)   . Heart murmur    DUE TO RHEUMATIC FEVER PER PT  . Hyperlipidemia   . Hypertension   . Psoriasis   . Rheumatic fever   . Rosacea     Current Outpatient Medications  Medication Sig Dispense Refill  . Apremilast (OTEZLA) 30 MG TABS Take 1 tablet by mouth 2 (two) times daily.     . betamethasone dipropionate (DIPROLENE) 0.05 % ointment Apply 1 application topically 2 (two) times daily as needed. psoriasis    . calcium carbonate (TUMS EX) 750 MG  chewable tablet Chew 750 mg by mouth daily.    . cetirizine (ZYRTEC) 10 MG tablet Take 10 mg by mouth at bedtime.     . clotrimazole-betamethasone (LOTRISONE) cream APPLY TOPICALLY AS DIRECTED TWICE DAILY (Patient taking differently: Apply 1 application topically 2 (two) times daily as needed (BACTERIAL INFECTION). APPLY TOPICALLY AS DIRECTED TWICE DAILY) 60 g 5  . diphenhydrAMINE (BENADRYL) 25 mg capsule Take 50 mg by mouth at bedtime.     Marland Kitchen doxycycline (ADOXA) 100 MG tablet Take 50 mg by mouth daily.    . fluocinonide ointment (LIDEX) 2.70 % Apply 1 application topically 2 (two) times daily as needed (PSORIASIS BEHIND EARS).     . folic acid (FOLVITE) 1 MG tablet Take 1 mg by mouth daily.    . furosemide (LASIX) 40 MG tablet Take 1 tablet by mouth once daily 90 tablet 0  . gabapentin (NEURONTIN) 300 MG capsule TAKE 2 CAPSULES BY MOUTH THREE TIMES DAILY 540 capsule 0  . glipiZIDE (GLUCOTROL XL) 5 MG 24 hr tablet Take 1 tablet by mouth once daily with breakfast 90 tablet 0  . losartan (COZAAR) 50 MG tablet Take 1 tablet by mouth once daily 90 tablet 0  . methotrexate (RHEUMATREX) 2.5 MG tablet Take 10 mg by mouth every Friday. Caution:Chemotherapy. Protect from light.    . NONFORMULARY OR COMPOUNDED ITEM See pharmacy note 120  each 2  . nystatin ointment (MYCOSTATIN) Apply 1 application topically 2 (two) times daily.    . Omega-3 Fatty Acids (FISH OIL PO) Take 1 capsule by mouth 2 (two) times daily. 1040 mg of fish oil in each capsule    . omeprazole (PRILOSEC) 40 MG capsule Take 1 capsule (40 mg total) by mouth daily. 90 capsule 0  . simvastatin (ZOCOR) 10 MG tablet Take 1 tablet by mouth once daily 90 tablet 0  . SOOLANTRA 1 % CREA APPLY TO THE FACE EVERY DAY  6  . vitamin C (ASCORBIC ACID) 500 MG tablet Take 1,000 mg by mouth daily.     Alveda Reasons 20 MG TABS tablet TAKE 1 TABLET BY MOUTH ONCE DAILY WITH SUPPER 90 tablet 0  . albuterol (PROVENTIL HFA;VENTOLIN HFA) 108 (90 Base) MCG/ACT inhaler  Inhale 2 puffs into the lungs every 6 (six) hours as needed for wheezing or shortness of breath. (Patient not taking: Reported on 09/27/2020) 1 Inhaler 2   No current facility-administered medications for this visit.    No Known Allergies  Family History  Problem Relation Age of Onset  . Cancer Mother        skin  . Dementia Mother   . Cancer Sister        skin and liver  . Stroke Maternal Grandmother   . Skin cancer Brother   . Diabetes Neg Hx   . Heart disease Neg Hx   . Breast cancer Neg Hx     Social History   Socioeconomic History  . Marital status: Married    Spouse name: Not on file  . Number of children: Not on file  . Years of education: Not on file  . Highest education level: Not on file  Occupational History  . Not on file  Tobacco Use  . Smoking status: Former Smoker    Packs/day: 4.00    Years: 16.00    Pack years: 64.00    Types: Cigarettes    Quit date: 09/12/1995    Years since quitting: 25.0  . Smokeless tobacco: Never Used  . Tobacco comment: quit in 1997  Vaping Use  . Vaping Use: Never used  Substance and Sexual Activity  . Alcohol use: No    Alcohol/week: 0.0 standard drinks  . Drug use: No  . Sexual activity: Yes    Birth control/protection: None  Other Topics Concern  . Not on file  Social History Narrative  . Not on file   Social Determinants of Health   Financial Resource Strain: Not on file  Food Insecurity: Not on file  Transportation Needs: Not on file  Physical Activity: Not on file  Stress: Not on file  Social Connections: Not on file  Intimate Partner Violence: Not on file     Constitutional: Denies fever, malaise, fatigue, headache or abrupt weight changes.  HEENT: Denies eye pain, eye redness, ear pain, ringing in the ears, wax buildup, runny nose, nasal congestion, bloody nose, or sore throat. Respiratory: Pt reports intermittent shortness of breath.  Denies difficulty breathing, cough or sputum production.    Cardiovascular: Patient reports swelling in her legs denies chest pain, chest tightness, palpitations or swelling in the handst.  Gastrointestinal: Denies abdominal pain, bloating, constipation, diarrhea or blood in the stool.  GU: Denies urgency, frequency, pain with urination, burning sensation, blood in urine, odor or discharge. Musculoskeletal: Patient reports chronic joint pain.  Denies decrease in range of motion, difficulty with gait, muscle pain or joint  swelling.  Skin: Denies redness, rashes, lesions or ulcercations.  Neurological: Patient reports neuropathic pain in feet.  Denies dizziness, difficulty with memory, difficulty with speech or problems with balance and coordination.  Psych: Denies anxiety, depression, SI/HI.  No other specific complaints in a complete review of systems (except as listed in HPI above).  Objective:   Physical Exam   BP (!) 117/52 (BP Location: Right Arm, Patient Position: Sitting, Cuff Size: Large)   Pulse 75   Temp (!) 97.2 F (36.2 C) (Temporal)   Resp 18   Ht 5' (1.524 m)   Wt 262 lb 3.2 oz (118.9 kg)   BMI 51.21 kg/m  Wt Readings from Last 3 Encounters:  09/27/20 262 lb 3.2 oz (118.9 kg)  12/21/19 266 lb (120.7 kg)  04/30/19 244 lb 4.8 oz (110.8 kg)    General: Appears her stated age, obese, in NAD. Skin: Warm, dry and intact.  Senile purpura noted of bilateral upper extremities. HEENT: Head: normal shape and size; Eyes: sclera white and EOMs intact;  Cardiovascular: Normal rate and rhythm. S1,S2 noted.  No murmur, rubs or gallops noted.  1+ nonpitting BLE edema. No carotid bruits noted. Pulmonary/Chest: Normal effort and positive vesicular breath sounds. No respiratory distress. No wheezes, rales or ronchi noted.  Musculoskeletal:  No difficulty with gait.  Neurological: Alert and oriented.  Psychiatric: Mood and affect normal. Behavior is normal. Judgment and thought content normal.    BMET    Component Value Date/Time   NA 140  12/21/2019 1149   NA 143 01/07/2014 0000   K 4.4 12/21/2019 1149   CL 102 12/21/2019 1149   CO2 33 (H) 12/21/2019 1149   GLUCOSE 191 (H) 12/21/2019 1149   BUN 14 12/21/2019 1149   CREATININE 1.04 12/21/2019 1149   CALCIUM 9.4 12/21/2019 1149   GFRNONAA >60 04/30/2019 1203   GFRAA >60 04/30/2019 1203    Lipid Panel     Component Value Date/Time   CHOL 170 12/21/2019 1149   TRIG 195.0 (H) 12/21/2019 1149   HDL 53.10 12/21/2019 1149   CHOLHDL 3 12/21/2019 1149   VLDL 39.0 12/21/2019 1149   LDLCALC 78 12/21/2019 1149    CBC    Component Value Date/Time   WBC 8.2 12/21/2019 1149   RBC 4.14 12/21/2019 1149   HGB 12.2 12/21/2019 1149   HGB 14.5 08/16/2016 1200   HCT 37.1 12/21/2019 1149   HCT 42.8 08/16/2016 1200   PLT 336.0 12/21/2019 1149   PLT 297 08/16/2016 1200   MCV 89.6 12/21/2019 1149   MCV 86 08/16/2016 1200   MCH 28.5 04/30/2019 1203   MCHC 33.0 12/21/2019 1149   RDW 16.5 (H) 12/21/2019 1149   RDW 13.5 08/16/2016 1200   LYMPHSABS 2.1 04/30/2019 1203   MONOABS 0.6 04/30/2019 1203   EOSABS 0.2 04/30/2019 1203   BASOSABS 0.0 04/30/2019 1203    Hgb A1C Lab Results  Component Value Date   HGBA1C 7.5 (H) 12/21/2019          Assessment & Plan:

## 2020-09-27 NOTE — Assessment & Plan Note (Signed)
Continue Methotrexate, Gabapentin, Tramadol and Otezla She will continue to see rheumatology, will follow

## 2020-09-27 NOTE — Telephone Encounter (Signed)
Refilled at encounter

## 2020-09-27 NOTE — Patient Instructions (Signed)

## 2020-09-27 NOTE — Telephone Encounter (Signed)
Pt is requesting refill on all pt medication

## 2020-09-27 NOTE — Assessment & Plan Note (Signed)
POCT A1c today No urine microalbumin secondary to ARB therapy Encouraged her to consume a low-carb diet and exercise weight loss Continue Glipizide and Gabapentin She has not gotten an eye exam due to lack of insurance Encourage routine foot exams She declines all vaccines

## 2020-09-29 ENCOUNTER — Ambulatory Visit: Payer: Self-pay | Admitting: Pharmacist

## 2020-09-29 ENCOUNTER — Other Ambulatory Visit: Payer: Self-pay | Admitting: Internal Medicine

## 2020-09-29 ENCOUNTER — Other Ambulatory Visit: Payer: Self-pay

## 2020-09-29 ENCOUNTER — Telehealth: Payer: Self-pay

## 2020-09-29 DIAGNOSIS — L4050 Arthropathic psoriasis, unspecified: Secondary | ICD-10-CM

## 2020-09-29 DIAGNOSIS — I5032 Chronic diastolic (congestive) heart failure: Secondary | ICD-10-CM

## 2020-09-29 DIAGNOSIS — Z86718 Personal history of other venous thrombosis and embolism: Secondary | ICD-10-CM

## 2020-09-29 DIAGNOSIS — E7800 Pure hypercholesterolemia, unspecified: Secondary | ICD-10-CM

## 2020-09-29 DIAGNOSIS — E0843 Diabetes 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 autonomic (poly)neuropathy: Secondary | ICD-10-CM

## 2020-09-29 DIAGNOSIS — K219 Gastro-esophageal reflux disease without esophagitis: Secondary | ICD-10-CM

## 2020-09-29 DIAGNOSIS — C541 Malignant neoplasm of endometrium: Secondary | ICD-10-CM

## 2020-09-29 DIAGNOSIS — L719 Rosacea, unspecified: Secondary | ICD-10-CM

## 2020-09-29 DIAGNOSIS — I10 Essential (primary) hypertension: Secondary | ICD-10-CM

## 2020-09-29 NOTE — Chronic Care Management (AMB) (Signed)
Care Management   Pharmacy Note  09/29/2020 Name: Melissa Cabrera MRN: [id number] DOB: 02-23-1956  Subjective: Melissa Cabrera is a 65 y.o. year old female who is a primary care patient of [name], NP. The Care Management team was consulted for assistance with care management and care coordination needs.    Engaged with patient by telephone for initial visit in response to provider referral for pharmacy case management and/or care coordination services.   The patient was given information about Care Management services today including:  1. Care Management services includes personalized support from designated clinical staff supervised by the patient's primary care provider, including individualized plan of care and coordination with other care providers. 2. 24/7 contact phone numbers for assistance for urgent and routine care needs. 3. The patient may stop case management services at any time by phone call to the office staff.  Patient agreed to services and consent obtained.  Assessment:  Review of patient status, including review of consultants reports, laboratory and other test data, was performed as part of comprehensive evaluation and provision of chronic care management services.   SDOH (Social Determinants of Health) assessments and interventions performed:  none  Objective:  Lab Results  Component Value Date   CREATININE 1.04 12/21/2019   CREATININE 0.84 04/30/2019   CREATININE 0.90 04/26/2019   Care Plan  No Known Allergies  Medications Reviewed Today    Reviewed by [name], NP (Nurse Practitioner) on 09/27/20 at [place] List Status: <None>  Medication Order Taking? Sig Documenting Provider Last Dose Status Informant  albuterol (PROVENTIL HFA;VENTOLIN HFA) 108 (90 Base) MCG/ACT inhaler [id number] No Inhale 2 puffs into the lungs every 6 (six) hours as needed for wheezing or shortness of breath.  Patient not taking: Reported on 09/27/2020   Flora Lipps, MD  Not Taking Active   Apremilast (OTEZLA) 30 MG TABS [id number] Yes Take 1 tablet by mouth 2 (two) times daily.  Rosita Kea, PA-C Taking Active   betamethasone dipropionate (DIPROLENE) 0.05 % ointment [id number] Yes Apply 1 application topically 2 (two) times daily as needed. psoriasis [provider] Taking Active Self  calcium carbonate (TUMS EX) 750 MG chewable tablet [id number] Yes Chew 750 mg by mouth daily. [provider] Taking Active Self  cetirizine (ZYRTEC) 10 MG tablet [id number] Yes Take 10 mg by mouth at bedtime.  [provider] Taking Active Self  clotrimazole-betamethasone (LOTRISONE) cream [id number] Yes APPLY TOPICALLY AS DIRECTED TWICE DAILY  Patient taking differently: Apply 1 application topically 2 (two) times daily as needed (BACTERIAL INFECTION). APPLY TOPICALLY AS DIRECTED TWICE DAILY   Baity, Coralie Keens, NP Taking Active Self  diphenhydrAMINE (BENADRYL) 25 mg capsule [id number] Yes Take 50 mg by mouth at bedtime.  [provider] Taking Active Self  doxycycline (ADOXA) 100 MG tablet [id number] Yes Take 50 mg by mouth daily. [provider] Taking Active   fluocinonide ointment (LIDEX) 0.05 % [id number] Yes Apply 1 application topically 2 (two) times daily as needed (PSORIASIS BEHIND EARS).  [provider] Taking Active Self  folic acid (FOLVITE) 1 MG tablet [id number] Yes Take 1 mg by mouth daily. [provider] Taking Active Self  furosemide (LASIX) 40 MG tablet [id number] Yes Take 1 tablet by mouth once daily [name], NP Taking Active   gabapentin (NEURONTIN) 300 MG capsule [id number] Yes TAKE 2 CAPSULES BY MOUTH THREE TIMES DAILY Baity, Coralie Keens, NP Taking Active   glipiZIDE (GLUCOTROL XL) 5 MG 24 hr tablet  [id number] Yes Take 1 tablet by mouth once daily with breakfast [name], NP Taking Active   losartan (COZAAR) 50 MG tablet [id number] Yes Take 1 tablet by mouth once daily [name], NP Taking  Active   methotrexate (RHEUMATREX) 2.5 MG tablet [id number] Yes Take 10 mg by mouth every Friday. Caution:Chemotherapy. Protect from light. [provider] Taking Active Self           Med Note Channing Mutters, JENNIFER   Thu Nov 21, 2016 11:23 AM) PT AWARE TO STOP PRIOR TO PROCEDURE   NONFORMULARY OR COMPOUNDED ITEM [id number] Yes See pharmacy note Edrick Kins, DPM Taking Active   nystatin ointment (MYCOSTATIN) [id number] Yes Apply 1 application topically 2 (two) times daily. [provider] Taking Active   Omega-3 Fatty Acids (FISH OIL PO) [id number] Yes Take 1 capsule by mouth 2 (two) times daily. 1040 mg of fish oil in each capsule [provider] Taking Active Self  omeprazole (PRILOSEC) 40 MG capsule [id number] Yes Take 1 capsule (40 mg total) by mouth daily. [name], NP Taking Active   simvastatin (ZOCOR) 10 MG tablet [id number] Yes Take 1 tablet by mouth once daily [name], NP Taking Active   SOOLANTRA 1 % CREA [id number] Yes APPLY TO THE [facility] DAY [provider] Taking Active   vitamin C (ASCORBIC ACID) 500 MG tablet [id number] Yes Take 1,000 mg by mouth daily.  [provider] Taking Active   XARELTO 20 MG TABS tablet [id number] Yes TAKE 1 TABLET BY MOUTH ONCE DAILY WITH SUPPER [name], NP Taking Active           Patient Active Problem List   Diagnosis Date Noted  . Chronic diastolic congestive heart failure ([place]) 12/21/2019  . Diabetes 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 ([place]) 12/21/2019  . History of DVT (deep vein thrombosis) 12/21/2019  . Rosacea 03/24/2018  . Psoriatic arthritis ([place]) 02/04/2017  . Endometrial cancer ([place]) 12/04/2016  . Gastroesophageal reflux disease without esophagitis 04/12/2014  . Seasonal allergies 04/12/2014  . Essential hypertension 04/12/2014  . HLD (hyperlipidemia) 04/12/2014    Conditions to be addressed/monitored: Medication Assistance  Care Plan  : PharmD - Medication Assistance  Updates made by Vella Raring, RPH-CPP since 09/29/2020 12:00 AM    Problem: Disease Progression     Long-Range Goal: Disease Progression Prevented or Minimized   Start Date: 09/29/2020  Expected End Date: 12/28/2020  This Visit's Progress: On track  Priority: High  Note:   Current Barriers:  . Unable to independently afford treatment regimen o Patient currently without prescription coverage o Patient currently enrolled in [place] Patient Assistance program for Xarelto through 10/17/2020  Pharmacist Clinical Goal(s):  Marland Kitchen Over the next 90 days, patient will verbalize ability to afford treatment regimen through collaboration with PharmD and provider.   Interventions: . 1:1 collaboration with [name], NP regarding development and update of comprehensive plan of care as evidenced by provider attestation and co-signature . Inter-disciplinary care team collaboration (see longitudinal plan of care) . Patient declines to complete medication review today. Will offer again during next outreach. . Declines other needs/concerns related to medications today.  Medication Assistance: . Patient reports copayment for Xarelto is unaffordable as does not have prescription drug coverage o Reports she is currently enrolled in the [place] Patient Assistance program for Xarelto. From review of letter scanned into chart, patient currently eligible through 10/17/2020. o Reports she  has brought paperwork for re-application into office. . Will collaborate with [facility] CPhT Susy Frizzle for assistance to patient with reapplication for Xarelto patient assistance program . Collaborate with PCP to request paperwork received from patient in office, as well as provider portion, be faxed to [place]. . Patient denies further medication assistance needs. . Provide patient with contact information for Medication Management Clinic  Patient Goals/Self-Care  Activities . Over the next 90 days, patient will:  - collaborate with provider on medication access solutions  Follow Up Plan: Telephone follow up appointment with care management team member scheduled for: 5/27 at 8:30 am      Follow Up:  Patient agrees to Care Plan and Follow-up.  Harlow Asa, PharmD, [place], CPP Clinical Pharmacist [facility] [phone]

## 2020-09-29 NOTE — Patient Instructions (Addendum)
Thank you allowing the Care Management Team to be a part of your care! It was a pleasure speaking with you today!     Visit Information  Goals Addressed            This Visit's Progress   . Pharmacy Goals       Patient will work with CM Pharmacist and provider to address medication access needs/reapply for patient assistance program.       Ms. Cavness was given information about Care Management services today including:  1. Care Management services include personalized support from designated clinical staff supervised by her physician, including individualized plan of care and coordination with other care providers 2. 24/7 contact phone numbers for assistance for urgent and routine care needs. 3. The patient may stop CCM services at any time (effective at the end of the month) by phone call to the office staff.  Patient agreed to services and verbal consent obtained.   The patient verbalized understanding of instructions, educational materials, and care plan provided today and declined offer to receive copy of patient instructions, educational materials, and care plan.   Telephone follow up appointment with care management team member scheduled for: 5/27 at 8:30 am  Harlow Asa, PharmD, [place], [place] [phone]

## 2020-09-29 NOTE — Chronic Care Management (AMB) (Signed)
  Care Management   Note  09/29/2020 Name: Melissa Cabrera MRN: [id number] DOB: 10-06-1955  Melissa Cabrera is a 65 y.o. year old female who is a primary care patient of [name], NP. I reached out to Melissa Cabrera by phone today in response to a referral sent by Melissa Cabrera health plan.    Melissa Cabrera was given information about care management services today including:  1. Care management services include personalized support from designated clinical staff supervised by her physician, including individualized plan of care and coordination with other care providers 2. 24/7 contact phone numbers for assistance for urgent and routine care needs. 3. The patient may stop care management services at any time by phone call to the office staff.  Patient did not agree to enrollment in care management services and does not wish to consider at this time.  Follow up plan: Patient declinesengagement by the care management team. Appropriate care team members and provider have been notified via electronic communication.   Melissa Cabrera, Melissa Cabrera, Melissa Cabrera, Melissa Cabrera [id number] Direct Dial: [phone] [email or web address] Website: [email or web address]

## 2020-10-06 ENCOUNTER — Ambulatory Visit: Payer: Self-pay | Admitting: Pharmacist

## 2020-10-06 ENCOUNTER — Telehealth: Payer: Self-pay | Admitting: Pharmacist

## 2020-10-06 DIAGNOSIS — I82431 Acute embolism and thrombosis of right popliteal vein: Secondary | ICD-10-CM

## 2020-10-06 DIAGNOSIS — I10 Essential (primary) hypertension: Secondary | ICD-10-CM

## 2020-10-06 DIAGNOSIS — Z86718 Personal history of other venous thrombosis and embolism: Secondary | ICD-10-CM

## 2020-10-06 DIAGNOSIS — E7800 Pure hypercholesterolemia, unspecified: Secondary | ICD-10-CM

## 2020-10-06 DIAGNOSIS — E0843 Diabetes 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 autonomic (poly)neuropathy: Secondary | ICD-10-CM

## 2020-10-06 MED ORDER — RIVAROXABAN 20 MG PO TABS: 20.0000 mg | ORAL_TABLET | Freq: Every day | ORAL | 1 refills | Status: DC

## 2020-10-06 NOTE — Patient Instructions (Signed)
Thank you allowing the Care Management Team to be a part of your care! It was a pleasure speaking with you today!     Care Management Team    Noreene Larsson RN, MSN, CCM Nurse Care Coordinator  [phone]   Harlow Asa PharmD  Clinical Pharmacist  [phone]     Visit Information  Goals Addressed            This Visit's Progress   . Pharmacy Goals        Patient will work with CM Pharmacist and provider to address medication access needs/reapply for patient assistance program.       The patient verbalized understanding of instructions, educational materials, and care plan provided today and declined offer to receive copy of patient instructions, educational materials, and care plan.   Telephone follow up appointment with care management team member scheduled for: 11/08/2020 at 8:30 AM  Harlow Asa, PharmD, [place], [place] [phone]

## 2020-10-06 NOTE — Telephone Encounter (Signed)
Patient requesting refill of Xarelto be sent to [facility].  Thank you,  Grayland Ormond

## 2020-10-06 NOTE — Chronic Care Management (AMB) (Signed)
Care Management   Pharmacy Note  10/06/2020 Name: Melissa Cabrera MRN: [id number] DOB: 02/08/1956  Subjective: Melissa Cabrera is a 65 y.o. year old female who is a primary care patient of [name], NP. The Care Management team was consulted for assistance with care management and care coordination needs.    Engaged with patient by telephone for follow up visit in response to provider referral for pharmacy case management and/or care coordination services.   The patient was given information about Care Management services today including:  1. Care Management services includes personalized support from designated clinical staff supervised by the patient's primary care provider, including individualized plan of care and coordination with other care providers. 2. 24/7 contact phone numbers for assistance for urgent and routine care needs. 3. The patient may stop case management services at any time by phone call to the office staff.  Patient agreed to services and consent obtained.  Assessment:  Review of patient status, including review of consultants reports, laboratory and other test data, was performed as part of comprehensive evaluation and provision of chronic care management services.   SDOH (Social Determinants of Health) assessments and interventions performed:  none  Objective:  Lab Results  Component Value Date   CREATININE 1.04 12/21/2019   CREATININE 0.84 04/30/2019   CREATININE 0.90 04/26/2019    Lab Results  Component Value Date   HGBA1C 7.9 (A) 09/27/2020       Component Value Date/Time   CHOL 170 12/21/2019 1149   TRIG 195.0 (H) 12/21/2019 1149   HDL 53.10 12/21/2019 1149   CHOLHDL 3 12/21/2019 1149   VLDL 39.0 12/21/2019 1149   LDLCALC 78 12/21/2019 1149   LDLDIRECT 142.0 05/16/2014 0844    BP Readings from Last 3 Encounters:  09/27/20 (!) 117/52  12/21/19 (!) 142/84  04/30/19 (!) 152/82    Care Plan  No Known Allergies  Medications Reviewed  Today    Reviewed by Vella Raring, RPH-CPP (Pharmacist) on 10/06/20 at [phone]  Med List Status: <None>  Medication Order Taking? Sig Documenting Provider Last Dose Status Informant  Apremilast (OTEZLA) 30 MG TABS [id number] Yes Take 1 tablet by mouth 2 (two) times daily.  Rosita Kea, PA-C Taking Active   betamethasone dipropionate (DIPROLENE) 0.05 % ointment [id number] Yes Apply 1 application topically 2 (two) times daily as needed. psoriasis [provider] Taking Active Self  calcium carbonate (TUMS EX) 750 MG chewable tablet [id number] Yes Chew 750 mg by mouth daily. [provider] Taking Active Self  cetirizine (ZYRTEC) 10 MG tablet [id number] Yes Take 10 mg by mouth at bedtime.  [provider] Taking Active Self  Cholecalciferol 25 MCG (1000 UT) tablet [id number] Yes Take 1,000 Units by mouth daily. [provider] Taking Active   clotrimazole-betamethasone (LOTRISONE) cream [id number] Yes APPLY TOPICALLY AS DIRECTED TWICE DAILY  Patient taking differently: Apply 1 application topically 2 (two) times daily as needed (BACTERIAL INFECTION). APPLY TOPICALLY AS DIRECTED TWICE DAILY   Baity, Coralie Keens, NP Taking Active Self  diphenhydrAMINE (BENADRYL) 25 mg capsule [id number] Yes Take 50 mg by mouth at bedtime.  [provider] Taking Active Self  doxycycline (ADOXA) 100 MG tablet [id number] Yes Take 50 mg by mouth daily. [provider] Taking Active   fluocinonide ointment (LIDEX) 0.05 % [id number] Yes Apply 1 application topically 2 (two) times daily as needed (PSORIASIS BEHIND EARS).  [provider] Taking Active Self  folic acid (FOLVITE) 1 MG tablet [id number] Yes Take 1  mg by mouth daily. [provider] Taking Active Self  furosemide (LASIX) 40 MG tablet [id number] Yes Take 1 tablet by mouth once daily [name], NP Taking Active   gabapentin (NEURONTIN) 300 MG capsule [id number] Yes Take 2 capsules (600 mg total) by  mouth 3 (three) times daily. [name], NP Taking Active   glipiZIDE (GLUCOTROL XL) 5 MG 24 hr tablet [id number] Yes Take 1 tablet (5 mg total) by mouth daily with breakfast. [name], NP Taking Active   losartan (COZAAR) 50 MG tablet [id number] Yes Take 1 tablet by mouth once daily Dutch Quint B, FNP Taking Active   losartan (COZAAR) 50 MG tablet [id number] Yes Take 1 tablet (50 mg total) by mouth daily. [name], NP Taking Active   methotrexate (RHEUMATREX) 2.5 MG tablet [id number] Yes Take 25 mg by mouth every Friday. Caution:Chemotherapy. Protect from light. [provider] Taking Active Self           Med Note Channing Mutters, JENNIFER   Thu Nov 21, 2016 11:23 AM) PT AWARE TO STOP PRIOR TO PROCEDURE   nystatin ointment (MYCOSTATIN) [id number] Yes Apply 1 application topically 2 (two) times daily. [provider] Taking Active   Omega-3 Fatty Acids (FISH OIL PO) [id number] Yes Take 1 capsule by mouth in the morning. 1040 mg of fish oil in each capsule [provider] Taking Active Self  omeprazole (PRILOSEC) 40 MG capsule [id number] Yes Take 1 capsule (40 mg total) by mouth daily.  Patient taking differently: Take 20 mg by mouth 2 (two) times daily.   [name], NP Taking Active   predniSONE (DELTASONE) 5 MG tablet [id number] Yes Take 5 mg by mouth daily. [provider] Taking Active   simvastatin (ZOCOR) 10 MG tablet [id number] Yes Take 1 tablet (10 mg total) by mouth daily. [name], NP Taking Active   SOOLANTRA 1 % CREA [id number] Yes APPLY TO THE [facility] DAY [provider] Taking Active   traMADol (ULTRAM) 50 MG tablet [id number] Yes Take 50 mg by mouth daily as needed. [provider] Taking Active   vitamin C (ASCORBIC ACID) 500 MG tablet [id number] Yes Take 1,000 mg by mouth daily.  [provider] Taking Active   XARELTO 20 MG TABS tablet [id number] Yes TAKE 1 TABLET BY MOUTH ONCE DAILY WITH SUPPER [name], NP Taking Active           Patient Active Problem List   Diagnosis Date Noted  . Chronic diastolic congestive heart failure ([place]) 12/21/2019  . Diabetes 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 ([place]) 12/21/2019  . History of DVT (deep vein thrombosis) 12/21/2019  . Rosacea 03/24/2018  . Psoriatic arthritis ([place]) 02/04/2017  . Endometrial cancer ([place]) 12/04/2016  . Gastroesophageal reflux disease without esophagitis 04/12/2014  . Seasonal allergies 04/12/2014  . Essential hypertension 04/12/2014  . HLD (hyperlipidemia) 04/12/2014    Conditions to be addressed/monitored: History of DVT, T2DM, HLD, HTN  Care Plan : PharmD - Medication Assistance  Updates made by Vella Raring, RPH-CPP since 10/06/2020 12:00 AM    Problem: Disease Progression     Long-Range Goal: Disease Progression Prevented or Minimized   Start Date: 09/29/2020  Expected End Date: 12/28/2020  This Visit's Progress: On track  Recent Progress: On track  Priority: High  Note:   Current Barriers:  . Unable to independently afford treatment regimen o Patient currently without prescription coverage o Patient currently enrolled  in [place] Patient Assistance program for Xarelto through 10/17/2020  Pharmacist Clinical Goal(s):  Marland Kitchen Over the next 90 days, patient will verbalize ability to afford treatment regimen through collaboration with PharmD and provider.   Interventions: . 1:1 collaboration with [name], NP regarding development and update of comprehensive plan of care as evidenced by provider attestation and co-signature . Inter-disciplinary care team collaboration (see longitudinal plan of care) . Comprehensive medication review performed; medication list updated in electronic medical record o Reports follows with [place] and Rheumatologist manages her methotrexate, folic acid Otezla, gabapentin and tramadol - Counsel on potential  interaction between omeprazole and methotrexate, as Proton Pump Inhibitors (PPI) may impact the serum concentration of methotrexate . Reports she has been taking current doses of both omeprazole and methotrexate long-term, Rheumatologist aware she is taking PPI and Rheumatologist draws labs every 3 months. o Reports takes doxycycline as prescribed by Dermatologist - Counsel patient on separating administration of antacids from doxycycline o Counsel on monitoring for s/s of bleeding with Xarelto and encourage follow up with providers for new/worsening medical concerns o Counsel on risk of dizziness/sedation with tramadol, diphenhydramine, cetirizine and gabapentin. - Patient denies dizziness/sedation - Reports uses tramadol only as needed - Reports takes both diphenhydramine and cetirizine as recommended by dermatologist for ezcema . Reports had severe itching when tried to stop diphenhydramine in the past.  . Reports will continue to follow up with Dermatologist . Reports her husband manages her medications and fills weekly pillbox for patient.  Medication Assistance: . Copayment for Xarelto is unaffordable as does not have prescription drug coverage o Currently enrolled in the [place] Patient Assistance program for Xarelto. From review of letter scanned into chart, patient currently eligible through 10/17/2020. Marland Kitchen Collaborate with Garland Simcox for assistance to patient with reapplication for Xarelto patient assistance program.  o Today reports received patient's application from office, but awaiting signature from provider on provider portion . Identify patient due for refill of Xarelto, as reports last received 90 day supply from program on 07/03/2020 o Send request for Xarelto renewal to office clinical team as current Rx out of refills o Counsel patient to have refilled through local pharmacy using card from patient assistance program prior to end of supply and end of current  enrollment . Patient denies further medication assistance needs. . Have provided patient with contact information for Medication Management Clinic  Hyperlipidemia: . Current treatment: o Simvastatin 10 mg QHS o Omega-3 supplement daily . Reports plans to follow up with provider as recommended for labs at upcoming AWV  Hypertension: . Current treatment: o Losartan 50 mg daily o Furosemide 40 mg daily . Reports has home upper arm BP monitor, but denies routinely monitoring at home  T2DM: . Uncontrolled based on latest A1C; current treatment: o Glipizide ER 5 mg daily with breakfast o Note patient on prednisone 5 mg daily from Rheumatology . Note has not yet received annual eye exam due to lack of insurance   Patient Goals/Self-Care Activities . Over the next 90 days, patient will:  - collaborate with provider on medication access solutions  Follow Up Plan: Telephone follow up appointment with care management team member scheduled for: 11/08/2020 at 8:30 AM      Medication Assistance: currently enrolled in [place] Patient Assistance program for Xarelto through 10/17/2020  Follow Up:  Patient agrees to Care Plan and Follow-up.  Harlow Asa, PharmD, [place], CPP Clinical Pharmacist [facility]  [place] [phone]

## 2020-10-10 ENCOUNTER — Telehealth: Payer: Self-pay | Admitting: Pharmacy Technician

## 2020-10-10 DIAGNOSIS — Z596 Low income: Secondary | ICD-10-CM

## 2020-10-10 NOTE — Progress Notes (Signed)
[facility])                                            [place] Team    10/10/2020  Melissa Cabrera 1956/04/17 [id number]                                      Medication Assistance Referral  Referral From: [facility] Embedded RPh Dorthula Perfect   Medication/Company: [employer] / [employer] Patient application portion:  N/A Embedded PharmD had it sent from clinic Provider application portion:  N/A Embedded PharmD had it sent from clinic to [facility], [place] Provider address/fax verified via: Office website   Received both patient and provider portion(s) of patient assistance application(s) for Xarelto. Faxed completed application and required documents into [employer].    Azha Constantin P. Lasheba Stevens, [place]  [phone]

## 2020-10-20 ENCOUNTER — Telehealth: Payer: Self-pay | Admitting: Pharmacy Technician

## 2020-10-20 DIAGNOSIS — Z596 Low income: Secondary | ICD-10-CM

## 2020-10-20 NOTE — Progress Notes (Signed)
[facility])                                            [place] Team    10/20/2020  Maritsa Hunsucker [birth year] [id number]  Care coordination call placed to [employer] in regards to Xarelto application.  Spoke to [place] who informed patient was APPROVED 10/10/20-10/10/21.  She informed the billing information is as follows for the pharmacy card: BIN [id number] ID [id number] Group [id number]  She informed the patient would be receiving a letter with the above information. Patient takes the letter/card to the pharmacy and have them bill for the Xarelto and the copay should come out as $0.00.  Successful outreach placed to patient, HIPAA verified. Informed patient of the above information. She verbalized understanding.  Veroncia Jezek P. Sharniece Gibbon, [place]  [phone]

## 2020-11-08 ENCOUNTER — Ambulatory Visit: Payer: Self-pay | Admitting: Pharmacist

## 2020-11-08 DIAGNOSIS — I10 Essential (primary) hypertension: Secondary | ICD-10-CM

## 2020-11-08 DIAGNOSIS — Z86718 Personal history of other venous thrombosis and embolism: Secondary | ICD-10-CM

## 2020-11-08 DIAGNOSIS — E0843 Diabetes 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 autonomic (poly)neuropathy: Secondary | ICD-10-CM

## 2020-11-08 NOTE — Chronic Care Management (AMB) (Signed)
Care Management   Pharmacy Note  11/08/2020 Name: Melissa Cabrera MRN: [id number] DOB: 16-Feb-1956  Subjective: Melissa Cabrera is a 65 y.o. year old female who is a primary care patient of [name], NP. The Care Management team was consulted for assistance with care management and care coordination needs.    Engaged with patient by telephone for follow up visit in response to provider referral for pharmacy case management and/or care coordination services.   The patient was given information about Care Management services today including:  Care Management services includes personalized support from designated clinical staff supervised by the patient's primary care provider, including individualized plan of care and coordination with other care providers. 24/7 contact phone numbers for assistance for urgent and routine care needs. The patient may stop case management services at any time by phone call to the office staff.  Patient agreed to services and consent obtained.  Assessment:  Review of patient status, including review of consultants reports, laboratory and other test data, was performed as part of comprehensive evaluation and provision of chronic care management services.   SDOH (Social Determinants of Health) assessments and interventions performed:  SDOH Interventions    Flowsheet Row Most Recent Value  SDOH Interventions   SDOH Interventions for the Following Domains Physical Activity  Physical Activity Interventions [employer]      yes  Objective:  Lab Results  Component Value Date   CREATININE 1.04 12/21/2019   CREATININE 0.84 04/30/2019   CREATININE 0.90 04/26/2019    Lab Results  Component Value Date   HGBA1C 7.9 (A) 09/27/2020       Component Value Date/Time   CHOL 170 12/21/2019 1149   TRIG 195.0 (H) 12/21/2019 1149   HDL 53.10 12/21/2019 1149   CHOLHDL 3 12/21/2019 1149   VLDL 39.0 12/21/2019 1149   LDLCALC 78 12/21/2019 1149    BP Readings  from Last 3 Encounters:  09/27/20 (!) 117/52  12/21/19 (!) 142/84  04/30/19 (!) 152/82    Care Plan  No Known Allergies  Medications Reviewed Today     Reviewed by Vella Raring, RPH-CPP (Pharmacist) on 10/06/20 at [phone]  Med List Status: <None>   Medication Order Taking? Sig Documenting Provider Last Dose Status Informant  Apremilast (OTEZLA) 30 MG TABS [id number] Yes Take 1 tablet by mouth 2 (two) times daily.  Rosita Kea, PA-C Taking Active   betamethasone dipropionate (DIPROLENE) 0.05 % ointment [id number] Yes Apply 1 application topically 2 (two) times daily as needed. psoriasis [provider] Taking Active Self  calcium carbonate (TUMS EX) 750 MG chewable tablet [id number] Yes Chew 750 mg by mouth daily. [provider] Taking Active Self  cetirizine (ZYRTEC) 10 MG tablet [id number] Yes Take 10 mg by mouth at bedtime.  [provider] Taking Active Self  Cholecalciferol 25 MCG (1000 UT) tablet [id number] Yes Take 1,000 Units by mouth daily. [provider] Taking Active   clotrimazole-betamethasone (LOTRISONE) cream [id number] Yes APPLY TOPICALLY AS DIRECTED TWICE DAILY  Patient taking differently: Apply 1 application topically 2 (two) times daily as needed (BACTERIAL INFECTION). APPLY TOPICALLY AS DIRECTED TWICE DAILY   Baity, Coralie Keens, NP Taking Active Self  diphenhydrAMINE (BENADRYL) 25 mg capsule [id number] Yes Take 50 mg by mouth at bedtime.  [provider] Taking Active Self  doxycycline (ADOXA) 100 MG tablet [id number] Yes Take 50 mg by mouth daily. [provider] Taking Active   fluocinonide ointment (LIDEX) 0.05 % [id number] Yes Apply 1 application topically  2 (two) times daily as needed (PSORIASIS BEHIND EARS).  [provider] Taking Active Self  folic acid (FOLVITE) 1 MG tablet [id number] Yes Take 1 mg by mouth daily. [provider] Taking Active Self  furosemide (LASIX) 40 MG tablet [id number] Yes  Take 1 tablet by mouth once daily [name], NP Taking Active   gabapentin (NEURONTIN) 300 MG capsule [id number] Yes Take 2 capsules (600 mg total) by mouth 3 (three) times daily. [name], NP Taking Active   glipiZIDE (GLUCOTROL XL) 5 MG 24 hr tablet [id number] Yes Take 1 tablet (5 mg total) by mouth daily with breakfast. [name], NP Taking Active   losartan (COZAAR) 50 MG tablet [id number] Yes Take 1 tablet by mouth once daily Dutch Quint B, FNP Taking Active   losartan (COZAAR) 50 MG tablet [id number] Yes Take 1 tablet (50 mg total) by mouth daily. [name], NP Taking Active   methotrexate (RHEUMATREX) 2.5 MG tablet [id number] Yes Take 25 mg by mouth every Friday. Caution:Chemotherapy. Protect from light. [provider] Taking Active Self           Med Note Channing Mutters, JENNIFER   Thu Nov 21, 2016 11:23 AM) PT AWARE TO STOP PRIOR TO PROCEDURE   nystatin ointment (MYCOSTATIN) [id number] Yes Apply 1 application topically 2 (two) times daily. [provider] Taking Active   Omega-3 Fatty Acids (FISH OIL PO) [id number] Yes Take 1 capsule by mouth in the morning. 1040 mg of fish oil in each capsule [provider] Taking Active Self  omeprazole (PRILOSEC) 40 MG capsule [id number] Yes Take 1 capsule (40 mg total) by mouth daily.  Patient taking differently: Take 20 mg by mouth 2 (two) times daily.   [name], NP Taking Active   predniSONE (DELTASONE) 5 MG tablet [id number] Yes Take 5 mg by mouth daily. [provider] Taking Active   simvastatin (ZOCOR) 10 MG tablet [id number] Yes Take 1 tablet (10 mg total) by mouth daily. [name], NP Taking Active   SOOLANTRA 1 % CREA [id number] Yes APPLY TO THE [facility] DAY [provider] Taking Active   traMADol (ULTRAM) 50 MG tablet [id number] Yes Take 50 mg by mouth daily as needed. [provider] Taking Active   vitamin C (ASCORBIC ACID) 500 MG tablet [id number] Yes Take 1,000  mg by mouth daily.  [provider] Taking Active   XARELTO 20 MG TABS tablet [id number] Yes TAKE 1 TABLET BY MOUTH ONCE DAILY WITH SUPPER [name], NP Taking Active             Patient Active Problem List   Diagnosis Date Noted   Chronic diastolic congestive heart failure ([place]) 12/21/2019   Diabetes 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 ([place]) 12/21/2019   History of DVT (deep vein thrombosis) 12/21/2019   Rosacea 03/24/2018   Psoriatic arthritis ([place]) 02/04/2017   Endometrial cancer ([place]) 12/04/2016   Gastroesophageal reflux disease without esophagitis 04/12/2014   Seasonal allergies 04/12/2014   Essential hypertension 04/12/2014   HLD (hyperlipidemia) 04/12/2014    Conditions to be addressed/monitored: T2DM, history of DVT, HLD, HTN  Care Plan : PharmD - Medication Assistance  Updates made by Vella Raring, RPH-CPP since 11/08/2020 12:00 AM     Problem: Disease Progression      Long-Range Goal: Disease Progression Prevented or Minimized   Start Date: 09/29/2020  Expected End Date: 12/28/2020  This Visit's Progress: On track  Recent Progress: On track  Priority: High  Note:   Current Barriers:  Unable to independently afford treatment regimen Patient currently without prescription coverage Patient currently enrolled in [place] Patient Assistance program for Xarelto through 10/10/2021 Fear of injections  Pharmacist Clinical Goal(s):  Over the next 90 days, patient will verbalize ability to afford treatment regimen through collaboration with PharmD and provider.   Interventions: 1:1 collaboration with [name], NP regarding development and update of comprehensive plan of care as evidenced by provider attestation and co-signature Inter-disciplinary care team collaboration (see longitudinal plan of care) Reports her husband manages her medications and fills weekly pillbox for patient. Today patient  expresses frustration regarding how her fear of injections has limited her medication management options for treatment of psoriatic arthritis Reports received lots of injections as a child and as an adult faints with injections Patient expresses interest in addressing this barrier and possibly having injection administered in office Patient agrees to discuss options with Rheumatologist at next visit  Medication Assistance: Received message from [place] patient is APPROVED for re-enrollment in [place] Patient Assistance program for Xarelto and patient has received letter from company with new billing information to provide to pharmacy Patient denies further medication assistance needs at this time Have provided patient with contact information for [facility]  T2DM: Uncontrolled based on latest A1C; current treatment: Glipizide ER 5 mg daily with breakfast Note patient on prednisone 5 mg daily from Rheumatology Reports has a glucometer, but does not currently monitor home blood sugar Counsel on strategies for reducing discomfort with checking blood sugar Current dietary patterns: breakfast: frosted mini wheats or Cheerios with unsweet almond milk; lunch: Chicken noodle soup and crackers for lunch; supper: Stouffers spaghetti meal or oodles noodles; drinks: almond milk (in cereal), skim milk, water and coffee Counsel patient on factors impacting blood sugar, including diet, exercise and medications Discuss medication management options for T2DM. Patient denies having tried in the past and interested in trying metformin to help with blood sugar control Counsel patient on importance of having well-balanced meals and limiting carbohydate portion sizes Exercise: reports that she is not currently exercising due pain in lower body/psoriatic arthritis/other medical concerns Reports she would like to/enjoys swimming, but does not go to pool as is uncomfortable in/not  willing to wear traditional bathing suit to pool, preferring more coverage Contact [employer] today and am advised patient may wear more bike shorts or nylon leggings in the pool - requirement is that clothing is made out of bathing suit material, not cotton or denim Advise patient who states she will follow up with the [facility] Patient interested in having a floor pedal exerciser to use while sitting at home. Let patient know I will see if this is available from a community resource Note has not yet received annual eye exam due to lack of insurance  Hyperlipidemia: Current treatment: Simvastatin 10 mg QHS Omega-3 supplement daily Reported plans to follow up with provider as recommended for labs at upcoming AWV  Hypertension: Current treatment: Losartan 50 mg daily Furosemide 40 mg daily Reports has home upper arm BP monitor, but denies routinely monitoring at home Encourage patient to keep a log of results when checks BP at home and to bring this record with her to medical appointments   Patient Goals/Self-Care Activities Over the next 90 days, patient will:  - collaborate with provider on medication access solutions  Follow Up Plan: Telephone follow up appointment with care management  team member scheduled for: 9/26 at 8:30 am      Medication Assistance:  enrolled in [place] Patient Assistance program for Xarelto through 10/10/2021  Follow Up:  Patient agrees to Care Plan and Follow-up.  Harlow Asa, PharmD, [place], CPP Clinical Pharmacist [facility] [phone]

## 2020-11-08 NOTE — Patient Instructions (Signed)
Thank you allowing the Care Management Team to be a part of your care! It was a pleasure speaking with you today!     Care Management Team    Noreene Larsson RN, MSN, CCM Nurse Care Coordinator  [phone]   Harlow Asa PharmD  Clinical Pharmacist  [phone]    Visit Information   Goals Addressed             This Visit's Progress    Pharmacy Goals       Patient goals:  Our goal A1c is less than 7%. This corresponds with fasting sugars less than 130 and 2 hour after meal sugars less than 180. Please keep a log when you check your blood sugar  Our goal bad cholesterol, or LDL, is less than 70 . This is why it is important to continue taking your simvastatin.  Please check your home blood pressure, keep a log of the results and bring this with you to your medical appointments.   Feel free to call me with any questions or concerns. I look forward to our next call!  Harlow Asa, PharmD, Para [place], CPP Clinical Pharmacist [facility] [phone]         The patient verbalized understanding of instructions, educational materials, and care plan provided today and declined offer to receive copy of patient instructions, educational materials, and care plan.   Telephone follow up appointment with care management team member scheduled for: 9/26 at 8:30 am

## 2020-11-09 ENCOUNTER — Other Ambulatory Visit: Payer: Self-pay | Admitting: Internal Medicine

## 2020-11-09 MED ORDER — METFORMIN HCL 500 MG PO TABS
ORAL_TABLET | ORAL | 0 refills | Status: DC
Start: 2020-11-09 — End: 2021-01-23

## 2020-11-10 ENCOUNTER — Ambulatory Visit: Payer: Self-pay | Admitting: Pharmacist

## 2020-11-10 DIAGNOSIS — E0843 Diabetes 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 autonomic (poly)neuropathy: Secondary | ICD-10-CM

## 2020-11-10 NOTE — Patient Instructions (Signed)
Thank you allowing the Care Management Team to be a part of your care! It was a pleasure speaking with you today!     Care Management Team    Noreene Larsson RN, MSN, CCM Nurse Care Coordinator  [phone]   Harlow Asa PharmD  Clinical Pharmacist  [phone]     Visit Information   Goals Addressed             This Visit's Progress    Pharmacy Goals       Our goal A1c is less than 7%. This corresponds with fasting sugars less than 130 and 2 hour after meal sugars less than 180. Please keep a log when you check your blood sugar  Our goal bad cholesterol, or LDL, is less than 70 . This is why it is important to continue taking your simvastatin.  Please check your home blood pressure, keep a log of the results and bring this with you to your medical appointments.  Feel free to call me with any questions or concerns. I look forward to our next call!  Harlow Asa, PharmD, Para [place], CPP Clinical Pharmacist [facility] [phone]         The patient verbalized understanding of instructions, educational materials, and care plan provided today and declined offer to receive copy of patient instructions, educational materials, and care plan.   Telephone follow up appointment with care management team member scheduled for: 7/8 at 8:30 am

## 2020-11-10 NOTE — Chronic Care Management (AMB) (Signed)
Care Management   Pharmacy Note  11/10/2020 Name: Melissa Cabrera MRN: [id number] DOB: July 09, 1955  Subjective: Melissa Cabrera is a 65 y.o. year old female who is a primary care patient of [name], NP. The Care Management team was consulted for assistance with care management and care coordination needs.    Engaged with patient by telephone for follow up visit in response to provider referral for pharmacy case management and/or care coordination services.   The patient was given information about Care Management services today including:  Care Management services includes personalized support from designated clinical staff supervised by the patient's primary care provider, including individualized plan of care and coordination with other care providers. 24/7 contact phone numbers for assistance for urgent and routine care needs. The patient may stop case management services at any time by phone call to the office staff.  Patient agreed to services and consent obtained.  Assessment:  Review of patient status, including review of consultants reports, laboratory and other test data, was performed as part of comprehensive evaluation and provision of chronic care management services.   SDOH (Social Determinants of Health) assessments and interventions performed:  none  Objective:  Lab Results  Component Value Date   CREATININE 1.04 12/21/2019   CREATININE 0.84 04/30/2019   CREATININE 0.90 04/26/2019    Lab Results  Component Value Date   HGBA1C 7.9 (A) 09/27/2020    BP Readings from Last 3 Encounters:  09/27/20 (!) 117/52  12/21/19 (!) 142/84  04/30/19 (!) 152/82    Care Plan  No Known Allergies  Medications Reviewed Today     Reviewed by Vella Raring, RPH-CPP (Pharmacist) on 10/06/20 at [phone]  Med List Status: <None>   Medication Order Taking? Sig Documenting Provider Last Dose Status Informant  Apremilast (OTEZLA) 30 MG TABS [id number] Yes Take 1 tablet by mouth  2 (two) times daily.  Rosita Kea, PA-C Taking Active   betamethasone dipropionate (DIPROLENE) 0.05 % ointment [id number] Yes Apply 1 application topically 2 (two) times daily as needed. psoriasis [provider] Taking Active Self  calcium carbonate (TUMS EX) 750 MG chewable tablet [id number] Yes Chew 750 mg by mouth daily. [provider] Taking Active Self  cetirizine (ZYRTEC) 10 MG tablet [id number] Yes Take 10 mg by mouth at bedtime.  [provider] Taking Active Self  Cholecalciferol 25 MCG (1000 UT) tablet [id number] Yes Take 1,000 Units by mouth daily. [provider] Taking Active   clotrimazole-betamethasone (LOTRISONE) cream [id number] Yes APPLY TOPICALLY AS DIRECTED TWICE DAILY  Patient taking differently: Apply 1 application topically 2 (two) times daily as needed (BACTERIAL INFECTION). APPLY TOPICALLY AS DIRECTED TWICE DAILY   Baity, Coralie Keens, NP Taking Active Self  diphenhydrAMINE (BENADRYL) 25 mg capsule [id number] Yes Take 50 mg by mouth at bedtime.  [provider] Taking Active Self  doxycycline (ADOXA) 100 MG tablet [id number] Yes Take 50 mg by mouth daily. [provider] Taking Active   fluocinonide ointment (LIDEX) 0.05 % [id number] Yes Apply 1 application topically 2 (two) times daily as needed (PSORIASIS BEHIND EARS).  [provider] Taking Active Self  folic acid (FOLVITE) 1 MG tablet [id number] Yes Take 1 mg by mouth daily. [provider] Taking Active Self  furosemide (LASIX) 40 MG tablet [id number] Yes Take 1 tablet by mouth once daily [name], NP Taking Active   gabapentin (NEURONTIN) 300 MG capsule [id number] Yes Take 2 capsules (600 mg total) by mouth 3 (three) times daily. Baity,  Coralie Keens, NP Taking Active   glipiZIDE (GLUCOTROL XL) 5 MG 24 hr tablet [id number] Yes Take 1 tablet (5 mg total) by mouth daily with breakfast. [name], NP Taking Active   losartan (COZAAR) 50 MG tablet [id number]  Yes Take 1 tablet by mouth once daily Dutch Quint B, FNP Taking Active   losartan (COZAAR) 50 MG tablet [id number] Yes Take 1 tablet (50 mg total) by mouth daily. [name], NP Taking Active   methotrexate (RHEUMATREX) 2.5 MG tablet [id number] Yes Take 25 mg by mouth every Friday. Caution:Chemotherapy. Protect from light. [provider] Taking Active Self           Med Note Channing Mutters, JENNIFER   Thu Nov 21, 2016 11:23 AM) PT AWARE TO STOP PRIOR TO PROCEDURE   nystatin ointment (MYCOSTATIN) [id number] Yes Apply 1 application topically 2 (two) times daily. [provider] Taking Active   Omega-3 Fatty Acids (FISH OIL PO) [id number] Yes Take 1 capsule by mouth in the morning. 1040 mg of fish oil in each capsule [provider] Taking Active Self  omeprazole (PRILOSEC) 40 MG capsule [id number] Yes Take 1 capsule (40 mg total) by mouth daily.  Patient taking differently: Take 20 mg by mouth 2 (two) times daily.   [name], NP Taking Active   predniSONE (DELTASONE) 5 MG tablet [id number] Yes Take 5 mg by mouth daily. [provider] Taking Active   simvastatin (ZOCOR) 10 MG tablet [id number] Yes Take 1 tablet (10 mg total) by mouth daily. [name], NP Taking Active   SOOLANTRA 1 % CREA [id number] Yes APPLY TO THE [facility] DAY [provider] Taking Active   traMADol (ULTRAM) 50 MG tablet [id number] Yes Take 50 mg by mouth daily as needed. [provider] Taking Active   vitamin C (ASCORBIC ACID) 500 MG tablet [id number] Yes Take 1,000 mg by mouth daily.  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 ([place])  12/21/2019   History of DVT (deep vein thrombosis) 12/21/2019   Rosacea 03/24/2018   Psoriatic arthritis ([place]) 02/04/2017   Endometrial cancer ([place]) 12/04/2016   Gastroesophageal reflux disease without esophagitis 04/12/2014   Seasonal allergies 04/12/2014   Essential hypertension 04/12/2014   HLD (hyperlipidemia) 04/12/2014    Conditions to be addressed/monitored: HTN, HLD, and DMII  Care Plan : PharmD - Medication Management/Assistance  Updates made by Vella Raring, RPH-CPP since 11/10/2020 12:00 AM     Problem: Disease Progression      Long-Range Goal: Disease Progression Prevented or Minimized   Start Date: 09/29/2020  Expected End Date: 12/28/2020  This Visit's Progress: On track  Recent Progress: On track  Priority: High  Note:   Current Barriers:  Unable to independently afford treatment regimen Patient currently without prescription coverage Patient APPROVED for re-enrolled in [place] Patient Assistance program for Xarelto through 10/10/2021 Fear of injections  Pharmacist Clinical Goal(s):  Over the next 90 days, patient will verbalize ability to afford treatment regimen Over the next 90 days, patient will achieve control of blood sugar control as evidenced by A1C through collaboration with PharmD  and provider.   Interventions: 1:1 collaboration with [name], NP regarding development and update of comprehensive plan of care as evidenced by provider attestation and co-signature Inter-disciplinary care team collaboration (see longitudinal plan of care) Reports her husband manages her medications and fills weekly pillbox for patient.  T2DM: Uncontrolled based on latest A1C; current treatment: Glipizide ER 5 mg daily with breakfast Note patient on prednisone 5 mg daily from Rheumatology Reports has a glucometer, but denies monitoring home blood sugar Have counseled on strategies for reducing discomfort with checking blood sugar Collaborate with  PCP regarding diabetes medication management. Provider agrees to addition of metformin and sends Rx to pharmacy for metformin 500 mg daily with breakfast for 14 days, THEN 500 mg BID with a meal for 14 days Follow up with patient today who is agreeable to this plan Counsel patient on starting metformin administration, common side effects and mechanism of action. Counsel on importance of taking with meal and taking consistently to reduce risk of GI side effects Have counseled on factors impacting blood sugar, including diet, exercise and medications Counsel patient on importance of having well-balanced meals and limiting carbohydate portion sizes Encourage patient to follow up with [employer] patient to restart monitoring home blood sugar and to keep log of the results Have counseled on s/s of hypoglycemia Will mail patient home blood sugar log (and home BP log) as requested  Patient Goals/Self-Care Activities Over the next 90 days, patient will:  - collaborate with provider on medication access solutions  Follow Up Plan: Telephone follow up appointment with care management team member scheduled for: 7/8 at 8:30 am       Follow Up:  Patient agrees to Care Plan and Follow-up.  Harlow Asa, PharmD, Para [place], CPP Clinical Pharmacist [facility] [phone]

## 2020-11-17 ENCOUNTER — Ambulatory Visit: Payer: Self-pay | Admitting: Pharmacist

## 2020-11-17 DIAGNOSIS — E0843 Diabetes 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 autonomic (poly)neuropathy: Secondary | ICD-10-CM

## 2020-11-17 NOTE — Chronic Care Management (AMB) (Signed)
Care Management   Pharmacy Note  11/17/2020 Name: Melissa Cabrera MRN: [id number] DOB: 1955-08-19  Subjective: Melissa Cabrera is a 65 y.o. year old female who is a primary care patient of [name], NP. The Care Management team was consulted for assistance with care management and care coordination needs.    Engaged with patient by telephone for follow up visit in response to provider referral for pharmacy case management and/or care coordination services.   The patient was given information about Care Management services today including:  Care Management services includes personalized support from designated clinical staff supervised by the patient's primary care provider, including individualized plan of care and coordination with other care providers. 24/7 contact phone numbers for assistance for urgent and routine care needs. The patient may stop case management services at any time by phone call to the office staff.  Patient agreed to services and consent obtained.  Assessment:  Review of patient status, including review of consultants reports, laboratory and other test data, was performed as part of comprehensive evaluation and provision of chronic care management services.   SDOH (Social Determinants of Health) assessments and interventions performed:  none  Objective:  Lab Results  Component Value Date   CREATININE 1.04 12/21/2019   CREATININE 0.84 04/30/2019   CREATININE 0.90 04/26/2019    Lab Results  Component Value Date   HGBA1C 7.9 (A) 09/27/2020       Component Value Date/Time   CHOL 170 12/21/2019 1149   TRIG 195.0 (H) 12/21/2019 1149   HDL 53.10 12/21/2019 1149   CHOLHDL 3 12/21/2019 1149   VLDL 39.0 12/21/2019 1149   LDLCALC 78 12/21/2019 1149   LDLDIRECT 142.0 05/16/2014 0844     BP Readings from Last 3 Encounters:  09/27/20 (!) 117/52  12/21/19 (!) 142/84  04/30/19 (!) 152/82    Care Plan  No Known Allergies  Medications Reviewed Today      Reviewed by Vella Raring, RPH-CPP (Pharmacist) on 10/06/20 at [phone]  Med List Status: <None>   Medication Order Taking? Sig Documenting Provider Last Dose Status Informant  Apremilast (OTEZLA) 30 MG TABS [id number] Yes Take 1 tablet by mouth 2 (two) times daily.  Rosita Kea, PA-C Taking Active   betamethasone dipropionate (DIPROLENE) 0.05 % ointment [id number] Yes Apply 1 application topically 2 (two) times daily as needed. psoriasis [provider] Taking Active Self  calcium carbonate (TUMS EX) 750 MG chewable tablet [id number] Yes Chew 750 mg by mouth daily. [provider] Taking Active Self  cetirizine (ZYRTEC) 10 MG tablet [id number] Yes Take 10 mg by mouth at bedtime.  [provider] Taking Active Self  Cholecalciferol 25 MCG (1000 UT) tablet [id number] Yes Take 1,000 Units by mouth daily. [provider] Taking Active   clotrimazole-betamethasone (LOTRISONE) cream [id number] Yes APPLY TOPICALLY AS DIRECTED TWICE DAILY  Patient taking differently: Apply 1 application topically 2 (two) times daily as needed (BACTERIAL INFECTION). APPLY TOPICALLY AS DIRECTED TWICE DAILY   Baity, Coralie Keens, NP Taking Active Self  diphenhydrAMINE (BENADRYL) 25 mg capsule [id number] Yes Take 50 mg by mouth at bedtime.  [provider] Taking Active Self  doxycycline (ADOXA) 100 MG tablet [id number] Yes Take 50 mg by mouth daily. [provider] Taking Active   fluocinonide ointment (LIDEX) 0.05 % [id number] Yes Apply 1 application topically 2 (two) times daily as needed (PSORIASIS BEHIND EARS).  [provider] Taking Active Self  folic acid (FOLVITE) 1 MG tablet [id number] Yes Take 1  mg by mouth daily. [provider] Taking Active Self  furosemide (LASIX) 40 MG tablet [id number] Yes Take 1 tablet by mouth once daily [name], NP Taking Active   gabapentin (NEURONTIN) 300 MG capsule [id number] Yes Take 2 capsules (600 mg total) by  mouth 3 (three) times daily. [name], NP Taking Active   glipiZIDE (GLUCOTROL XL) 5 MG 24 hr tablet [id number] Yes Take 1 tablet (5 mg total) by mouth daily with breakfast. [name], NP Taking Active   losartan (COZAAR) 50 MG tablet [id number] Yes Take 1 tablet by mouth once daily Dutch Quint B, FNP Taking Active   losartan (COZAAR) 50 MG tablet [id number] Yes Take 1 tablet (50 mg total) by mouth daily. [name], NP Taking Active   methotrexate (RHEUMATREX) 2.5 MG tablet [id number] Yes Take 25 mg by mouth every Friday. Caution:Chemotherapy. Protect from light. [provider] Taking Active Self           Med Note Channing Mutters, JENNIFER   Thu Nov 21, 2016 11:23 AM) PT AWARE TO STOP PRIOR TO PROCEDURE   nystatin ointment (MYCOSTATIN) [id number] Yes Apply 1 application topically 2 (two) times daily. [provider] Taking Active   Omega-3 Fatty Acids (FISH OIL PO) [id number] Yes Take 1 capsule by mouth in the morning. 1040 mg of fish oil in each capsule [provider] Taking Active Self  omeprazole (PRILOSEC) 40 MG capsule [id number] Yes Take 1 capsule (40 mg total) by mouth daily.  Patient taking differently: Take 20 mg by mouth 2 (two) times daily.   [name], NP Taking Active   predniSONE (DELTASONE) 5 MG tablet [id number] Yes Take 5 mg by mouth daily. [provider] Taking Active   simvastatin (ZOCOR) 10 MG tablet [id number] Yes Take 1 tablet (10 mg total) by mouth daily. [name], NP Taking Active   SOOLANTRA 1 % CREA [id number] Yes APPLY TO THE [facility] DAY [provider] Taking Active   traMADol (ULTRAM) 50 MG tablet [id number] Yes Take 50 mg by mouth daily as needed. [provider] Taking Active   vitamin C (ASCORBIC ACID) 500 MG tablet [id number] Yes Take 1,000 mg by mouth daily.  [provider] Taking Active   XARELTO 20 MG TABS tablet [id number] Yes TAKE 1 TABLET BY MOUTH ONCE DAILY WITH SUPPER [name], NP Taking Active             Patient Active Problem List   Diagnosis Date Noted   Chronic diastolic congestive heart failure ([place]) 12/21/2019   Diabetes 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 ([place]) 12/21/2019   History of DVT (deep vein thrombosis) 12/21/2019   Rosacea 03/24/2018   Psoriatic arthritis ([place]) 02/04/2017   Endometrial cancer ([place]) 12/04/2016   Gastroesophageal reflux disease without esophagitis 04/12/2014   Seasonal allergies 04/12/2014   Essential hypertension 04/12/2014   HLD (hyperlipidemia) 04/12/2014    Conditions to be addressed/monitored: T2DM, history of DVT, HLD, HTN  Care Plan : PharmD - Medication Management/Assistance  Updates made by Vella Raring, RPH-CPP since 11/17/2020 12:00 AM     Problem: Disease Progression      Long-Range Goal: Disease Progression Prevented or Minimized   Start Date: 09/29/2020  Expected End Date: 12/28/2020  This Visit's Progress: On track  Recent Progress: On track  Priority: High  Note:   Current Barriers:  Unable to independently afford treatment regimen Patient currently without prescription coverage Patient APPROVED  for re-enrolled in [employer] Patient Assistance program for Xarelto through 10/10/2021 Fear of injections  Pharmacist Clinical Goal(s):  Over the next 90 days, patient will verbalize ability to afford treatment regimen Over the next 90 days, patient will achieve control of blood sugar control as evidenced by A1C through collaboration with PharmD and provider.   Interventions: 1:1 collaboration with [name], NP regarding development and update of comprehensive plan of care as evidenced by provider attestation and co-signature Inter-disciplinary care team collaboration (see longitudinal plan of care) Reports her husband helps manage her medications and fills weekly pillbox for patient.  T2DM: Uncontrolled based on latest A1C; current  treatment: Glipizide ER 5 mg daily with breakfast Metformin 500 mg daily with breakfast (started on 7/2) Reports may have had a little stomach upset with start, but now tolerating current dose well Confirms will continue on metformin 500 mg daily with breakfast for another week, then increase to metformin 500 mg twice daily with meals Note patient on prednisone 5 mg daily from Rheumatology Reports obtained new glucometer, Contour Next, and restarted monitoring Reports recent blood sugar readings:  Before Breakfast After Breakfast After Lunch Before Supper After Supper Notes  2 - July 170 244*  147  *Cereal and bananas  3 - July 146 292  184    4 - July 131 214  147 134   5 - July 129 185  115    6 - July 118  117     7 - July 134       8 - July 112       Average 134 233  148     Reports made significant changes to her diet and is using her blood sugar readings as feedback to help determine impact of dietary choices on blood sugar Current dietary patterns: breakfast: 2 eggs and 1 piece whole grain toast + low fat cottage cheese and bacon; lunch and supper: chicken breast and frozen vegetables and cottage cheese Patient interested in having further information on meal planning with diabetes Discuss factors impacting blood sugar, including diet, exercise and medications Counsel patient on importance of having well-balanced meals and limiting carbohydate portion sizes Again encourage patient to follow up with [facility] Denies s/s of hypoglycemia Will mail patient diabetes meal planning educational resources as requested   Patient Goals/Self-Care Activities Over the next 90 days, patient will:  - collaborate with provider on medication access solutions  Follow Up Plan: Telephone follow up appointment with care management team member scheduled for: 7/29 at 8:30 am       Follow Up:  Patient agrees to Care Plan and Follow-up.  Harlow Asa, PharmD, Para March, CPP Clinical  Pharmacist [facility] [phone]

## 2020-11-17 NOTE — Patient Instructions (Signed)
Visit Information  PATIENT GOALS:  Goals Addressed             This Visit's Progress    Pharmacy Goals       Our goal A1c is less than 7%. This corresponds with fasting sugars less than 130 and 2 hour after meal sugars less than 180. Please keep a log when you check your blood sugar  Our goal bad cholesterol, or LDL, is less than 70 . This is why it is important to continue taking your simvastatin.  Please check your home blood pressure, keep a log of the results and bring this with you to your medical appointments.  Feel free to call me with any questions or concerns. I look forward to our next call!   Harlow Asa, PharmD, Para March, CPP Clinical Pharmacist [facility] [phone]         The patient verbalized understanding of instructions, educational materials, and care plan provided today and declined offer to receive copy of patient instructions, educational materials, and care plan.   Telephone follow up appointment with care management team member scheduled for: 7/29 at 8:30 am

## 2020-12-05 ENCOUNTER — Other Ambulatory Visit: Payer: Self-pay | Admitting: Internal Medicine

## 2020-12-05 NOTE — Telephone Encounter (Signed)
Requested Prescriptions  Pending Prescriptions Disp Refills  . furosemide (LASIX) 40 MG tablet [Pharmacy Med Name: Furosemide 40 MG Oral Tablet] 90 tablet 0    Sig: Take 1 tablet by mouth once daily     There is no refill protocol information for this order

## 2020-12-08 ENCOUNTER — Ambulatory Visit: Payer: Self-pay | Admitting: Pharmacist

## 2020-12-08 DIAGNOSIS — I10 Essential (primary) hypertension: Secondary | ICD-10-CM

## 2020-12-08 DIAGNOSIS — E0843 Diabetes 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 autonomic (poly)neuropathy: Secondary | ICD-10-CM

## 2020-12-08 NOTE — Chronic Care Management (AMB) (Deleted)
Chronic Care Management Pharmacy Note  12/08/2020 Name:  Melissa Cabrera MRN:  [id number] DOB:  22-May-1955   Subjective: Melissa Cabrera is an 65 y.o. year old female who is a primary patient of [name], NP.  The CCM team was consulted for assistance with disease management and care coordination needs.    Engaged with patient by telephone for follow up visit in response to provider referral for pharmacy case management and/or care coordination services.   Consent to Services:  The patient was given information about Chronic Care Management services, agreed to services, and gave verbal consent prior to initiation of services.  Please see initial visit note for detailed documentation.   Patient Care Team: [name], NP as PCP - General (Internal Medicine) Clent Jacks, RN as Registered Nurse Bralen Wiltgen, Virl Diamond, RPH-CPP as Pharmacist  Recent office visits: None  Hospital visits: None in previous 6 months  Objective:  Lab Results  Component Value Date   CREATININE 1.04 12/21/2019   CREATININE 0.84 04/30/2019   CREATININE 0.90 04/26/2019    Lab Results  Component Value Date   HGBA1C 7.9 (A) 09/27/2020   Last diabetic Eye exam: No results found for: HMDIABEYEEXA  Last diabetic Foot exam: No results found for: HMDIABFOOTEX      Component Value Date/Time   CHOL 170 12/21/2019 1149   TRIG 195.0 (H) 12/21/2019 1149   HDL 53.10 12/21/2019 1149   CHOLHDL 3 12/21/2019 1149   VLDL 39.0 12/21/2019 1149   LDLCALC 78 12/21/2019 1149   LDLDIRECT 142.0 05/16/2014 0844    Hepatic Function Latest Ref Rng & Units 12/21/2019 04/30/2019 02/04/2017  Total Protein 6.0 - 8.3 g/dL 6.9 7.8 7.0  Albumin 3.5 - 5.2 g/dL 4.0 4.0 4.0  AST 0 - 37 U/L _0 ALT 0 - 35 U/L _1 Alk Phosphatase 39 - 117 U/L 82 78 81  Total Bilirubin 0.2 - 1.2 mg/dL 0.4 0.6 0.3    Social History   Tobacco Use  Smoking Status Former   Packs/day: 4.00   Years: 16.00   Pack years:  64.00   Types: Cigarettes   Quit date: 09/12/1995   Years since quitting: 25.2  Smokeless Tobacco Never  Tobacco Comments   quit in 1997   BP Readings from Last 3 Encounters:  09/27/20 (!) 117/52  12/21/19 (!) 142/84  04/30/19 (!) 152/82   Pulse Readings from Last 3 Encounters:  09/27/20 75  12/21/19 84  04/30/19 78   Wt Readings from Last 3 Encounters:  09/27/20 262 lb 3.2 oz (118.9 kg)  12/21/19 266 lb (120.7 kg)  04/30/19 244 lb 4.8 oz (110.8 kg)    Assessment: Review of patient past medical history, allergies, medications, health status, including review of consultants reports, laboratory and other test data, was performed as part of comprehensive evaluation and provision of chronic care management services.   SDOH:  (Social Determinants of Health) assessments and interventions performed: none   CCM Care Plan  No Known Allergies  Medications Reviewed Today     Reviewed by Vella Raring, RPH-CPP (Pharmacist) on 10/06/20 at [phone]  Med List Status: <None>   Medication Order Taking? Sig Documenting Provider Last Dose Status Informant  Apremilast (OTEZLA) 30 MG TABS [id number] Yes Take 1 tablet by mouth 2 (two) times daily.  Rosita Kea, PA-C Taking Active   betamethasone dipropionate (DIPROLENE) 0.05 % ointment [id number] Yes Apply 1 application topically 2 (two) times daily as  needed. psoriasis [provider] Taking Active Self  calcium carbonate (TUMS EX) 750 MG chewable tablet [id number] Yes Chew 750 mg by mouth daily. [provider] Taking Active Self  cetirizine (ZYRTEC) 10 MG tablet [id number] Yes Take 10 mg by mouth at bedtime.  [provider] Taking Active Self  Cholecalciferol 25 MCG (1000 UT) tablet [id number] Yes Take 1,000 Units by mouth daily. [provider] Taking Active   clotrimazole-betamethasone (LOTRISONE) cream [id number] Yes APPLY TOPICALLY AS DIRECTED TWICE DAILY  Patient taking differently: Apply 1 application  topically 2 (two) times daily as needed (BACTERIAL INFECTION). APPLY TOPICALLY AS DIRECTED TWICE DAILY   Baity, Coralie Keens, NP Taking Active Self  diphenhydrAMINE (BENADRYL) 25 mg capsule [id number] Yes Take 50 mg by mouth at bedtime.  [provider] Taking Active Self  doxycycline (ADOXA) 100 MG tablet [id number] Yes Take 50 mg by mouth daily. [provider] Taking Active   fluocinonide ointment (LIDEX) 0.05 % [id number] Yes Apply 1 application topically 2 (two) times daily as needed (PSORIASIS BEHIND EARS).  [provider] Taking Active Self  folic acid (FOLVITE) 1 MG tablet [id number] Yes Take 1 mg by mouth daily. [provider] Taking Active Self  furosemide (LASIX) 40 MG tablet [id number] Yes Take 1 tablet by mouth once daily [name], NP Taking Active   gabapentin (NEURONTIN) 300 MG capsule [id number] Yes Take 2 capsules (600 mg total) by mouth 3 (three) times daily. [name], NP Taking Active   glipiZIDE (GLUCOTROL XL) 5 MG 24 hr tablet [id number] Yes Take 1 tablet (5 mg total) by mouth daily with breakfast. [name], NP Taking Active   losartan (COZAAR) 50 MG tablet [id number] Yes Take 1 tablet by mouth once daily Dutch Quint B, FNP Taking Active   losartan (COZAAR) 50 MG tablet [id number] Yes Take 1 tablet (50 mg total) by mouth daily. [name], NP Taking Active   methotrexate (RHEUMATREX) 2.5 MG tablet [id number] Yes Take 25 mg by mouth every Friday. Caution:Chemotherapy. Protect from light. [provider] Taking Active Self           Med Note Channing Mutters, JENNIFER   Thu Nov 21, 2016 11:23 AM) PT AWARE TO STOP PRIOR TO PROCEDURE   nystatin ointment (MYCOSTATIN) [id number] Yes Apply 1 application topically 2 (two) times daily. [provider] Taking Active   Omega-3 Fatty Acids (FISH OIL PO) [id number] Yes Take 1 capsule by mouth in the morning. 1040 mg of fish oil in each capsule [provider] Taking Active  Self  omeprazole (PRILOSEC) 40 MG capsule [id number] Yes Take 1 capsule (40 mg total) by mouth daily.  Patient taking differently: Take 20 mg by mouth 2 (two) times daily.   [name], NP Taking Active   predniSONE (DELTASONE) 5 MG tablet [id number] Yes Take 5 mg by mouth daily. [provider] Taking Active   simvastatin (ZOCOR) 10 MG tablet [id number] Yes Take 1 tablet (10 mg total) by mouth daily. [name], NP Taking Active   SOOLANTRA 1 % CREA [id number] Yes APPLY TO THE [facility] DAY [provider] Taking Active   traMADol (ULTRAM) 50 MG tablet [id number] Yes Take 50 mg by mouth daily as needed. [provider] Taking Active   vitamin C (ASCORBIC ACID) 500 MG tablet [id number] Yes Take 1,000 mg by mouth daily.  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  Conditions to be addressed/monitored: HTN, HLD, and DMII  Care Plan : PharmD - Medication Management/Assistance  Updates made by Vella Raring, RPH-CPP since 12/08/2020 12:00 AM     Problem: Disease Progression      Long-Range Goal: Disease Progression Prevented or Minimized   Start Date: 09/29/2020  Expected End Date: 12/28/2020  This Visit's Progress: On track   Recent Progress: On track  Priority: High  Note:   Current Barriers:  Unable to independently afford treatment regimen Patient currently without prescription coverage Patient APPROVED for re-enrolled in [place] Patient Assistance program for Xarelto through 10/10/2021 Fear of injections  Pharmacist Clinical Goal(s):  Over the next 90 days, patient will verbalize ability to afford treatment regimen Over the next 90 days, patient will achieve control of blood sugar control as evidenced by A1C through collaboration with PharmD and provider.   Interventions: 1:1 collaboration with [name], NP regarding development and update of comprehensive plan of care as evidenced by provider attestation and co-signature Inter-disciplinary care team collaboration (see longitudinal plan of care) Reports her husband helps manage her medications and fills weekly pillbox for patient.  T2DM: Uncontrolled based on latest A1C; current treatment: Glipizide ER 5 mg daily with breakfast Metformin 500 mg twice daily with breakfast and supper (increased on 7/15) Reports tolerating well. Denies diarrhea; reports mild constipation recently, but managing with lifestyle changes Note patient on prednisone 5 mg daily from Rheumatology Reports recent blood sugar readings:  Before Breakfast After Breakfast After Lunch Before Supper After Supper Notes  23 - July 118 217  131    24 - July 116 210   159   25 - July 110       26 - July 120   133    27 - July 104   81* 119 *Reports sweating, felt funny with lower blood sugar readings  28 - July 101   80*    29 - July 90       Average 108        Reports continues significant changes to her diet and is using her blood sugar readings as feedback to help determine impact of dietary choices on blood sugar Stopped processed foods, limiting carbohydrates and having balanced meals Discuss factors impacting blood sugar, including diet, exercise and  medications Reports has actively worked on weight loss and down ~15 lbs (home weight: 247 lbs) Reports has noticed some improvement in joint pain with weight loss and hoping to walk more.  Plans to start with walking in driveway as able to tolerate Also considering follow up with [facility] for option of swimming Will collaborate with PCP regarding diabetes medication management to reduce risk of hypoglycemia  Hypertension: Current treatment: Losartan 50 mg daily Furosemide 40 mg daily Reports recent home readings: 7/22: 139/76,  HR 64 7/23: 122/77, HR 78 7/26: 118/70, HR 71 7/27: 129/77, HR 73 7/28: 121/73, HR 74 Encourage patient to keep a log of results when checks BP at home 1-2 times/week and to bring this record with her to medical appointments  Hyperlipidemia: Current treatment: Simvastatin 10 mg QHS Omega-3 supplement daily Reported plans to follow up with provider as recommended for labs at upcoming AWV  Patient Goals/Self-Care Activities Over the next 90 days, patient will:  - collaborate with provider on medication access solutions  Follow Up Plan: Telephone follow up appointment with care management team member scheduled for:       Medication Assistance: {MEDASSISTANCEINFO:25044}  Patient's preferred pharmacy is:  [facility] [address] ([address]), [place] ([place]) [place] [address] Phone: [phone] Fax: [phone]  Uses pill box? {Yes or If no, why not?:20788} Pt endorses ***% compliance  Follow Up:  {FOLLOWUP:24991}  Plan: {CM FOLLOW UP PLAN:25073}  SIG***

## 2020-12-08 NOTE — Chronic Care Management (AMB) (Signed)
Care Management   Pharmacy Note  12/08/2020 Name: Melissa Cabrera MRN: [id number] DOB: 1955-12-23  Subjective: Melissa Cabrera is a 65 y.o. year old female who is a primary care patient of [name], NP. The Care Management team was consulted for assistance with care management and care coordination needs.    Engaged with patient by telephone for follow up visit in response to provider referral for pharmacy case management and/or care coordination services.   The patient was given information about Care Management services today including:  Care Management services includes personalized support from designated clinical staff supervised by the patient's primary care provider, including individualized plan of care and coordination with other care providers. 24/7 contact phone numbers for assistance for urgent and routine care needs. The patient may stop case management services at any time by phone call to the office staff.  Patient agreed to services and consent obtained.  Assessment:  Review of patient status, including review of consultants reports, laboratory and other test data, was performed as part of comprehensive evaluation and provision of chronic care management services.   SDOH (Social Determinants of Health) assessments and interventions performed:  none  Objective:  Lab Results  Component Value Date   CREATININE 1.04 12/21/2019   CREATININE 0.84 04/30/2019   CREATININE 0.90 04/26/2019    Lab Results  Component Value Date   HGBA1C 7.9 (A) 09/27/2020       Component Value Date/Time   CHOL 170 12/21/2019 1149   TRIG 195.0 (H) 12/21/2019 1149   HDL 53.10 12/21/2019 1149   CHOLHDL 3 12/21/2019 1149   VLDL 39.0 12/21/2019 1149   LDLCALC 78 12/21/2019 1149   LDLDIRECT 142.0 05/16/2014 0844   BP Readings from Last 3 Encounters:  09/27/20 (!) 117/52  12/21/19 (!) 142/84  04/30/19 (!) 152/82    Care Plan  No Known Allergies  Medications Reviewed Today      Reviewed by Vella Raring, RPH-CPP (Pharmacist) on 10/06/20 at [phone]  Med List Status: <None>   Medication Order Taking? Sig Documenting Provider Last Dose Status Informant  Apremilast (OTEZLA) 30 MG TABS [id number] Yes Take 1 tablet by mouth 2 (two) times daily.  Rosita Kea, PA-C Taking Active   betamethasone dipropionate (DIPROLENE) 0.05 % ointment [id number] Yes Apply 1 application topically 2 (two) times daily as needed. psoriasis [provider] Taking Active Self  calcium carbonate (TUMS EX) 750 MG chewable tablet [id number] Yes Chew 750 mg by mouth daily. [provider] Taking Active Self  cetirizine (ZYRTEC) 10 MG tablet [id number] Yes Take 10 mg by mouth at bedtime.  [provider] Taking Active Self  Cholecalciferol 25 MCG (1000 UT) tablet [id number] Yes Take 1,000 Units by mouth daily. [provider] Taking Active   clotrimazole-betamethasone (LOTRISONE) cream [id number] Yes APPLY TOPICALLY AS DIRECTED TWICE DAILY  Patient taking differently: Apply 1 application topically 2 (two) times daily as needed (BACTERIAL INFECTION). APPLY TOPICALLY AS DIRECTED TWICE DAILY   Baity, Coralie Keens, NP Taking Active Self  diphenhydrAMINE (BENADRYL) 25 mg capsule [id number] Yes Take 50 mg by mouth at bedtime.  [provider] Taking Active Self  doxycycline (ADOXA) 100 MG tablet [id number] Yes Take 50 mg by mouth daily. [provider] Taking Active   fluocinonide ointment (LIDEX) 0.05 % [id number] Yes Apply 1 application topically 2 (two) times daily as needed (PSORIASIS BEHIND EARS).  [provider] Taking Active Self  folic acid (FOLVITE) 1 MG tablet [id number] Yes Take 1 mg by  mouth daily. [provider] Taking Active Self  furosemide (LASIX) 40 MG tablet [id number] Yes Take 1 tablet by mouth once daily [name], NP Taking Active   gabapentin (NEURONTIN) 300 MG capsule [id number] Yes Take 2 capsules (600 mg total) by mouth 3  (three) times daily. [name], NP Taking Active   glipiZIDE (GLUCOTROL XL) 5 MG 24 hr tablet [id number] Yes Take 1 tablet (5 mg total) by mouth daily with breakfast. [name], NP Taking Active   losartan (COZAAR) 50 MG tablet [id number] Yes Take 1 tablet by mouth once daily Dutch Quint B, FNP Taking Active   losartan (COZAAR) 50 MG tablet [id number] Yes Take 1 tablet (50 mg total) by mouth daily. [name], NP Taking Active   methotrexate (RHEUMATREX) 2.5 MG tablet [id number] Yes Take 25 mg by mouth every Friday. Caution:Chemotherapy. Protect from light. [provider] Taking Active Self           Med Note Channing Mutters, JENNIFER   Thu Nov 21, 2016 11:23 AM) PT AWARE TO STOP PRIOR TO PROCEDURE   nystatin ointment (MYCOSTATIN) 0000000 Yes Apply 1 application topically 2 (two) times daily. [provider] Taking Active   Omega-3 Fatty Acids (FISH OIL PO) [id number] Yes Take 1 capsule by mouth in the morning. 1040 mg of fish oil in each capsule [provider] Taking Active Self  omeprazole (PRILOSEC) 40 MG capsule [id number] Yes Take 1 capsule (40 mg total) by mouth daily.  Patient taking differently: Take 20 mg by mouth 2 (two) times daily.   [name], NP Taking Active   predniSONE (DELTASONE) 5 MG tablet [id number] Yes Take 5 mg by mouth daily. [provider] Taking Active   simvastatin (ZOCOR) 10 MG tablet [id number] Yes Take 1 tablet (10 mg total) by mouth daily. [name], NP Taking Active   SOOLANTRA 1 % CREA [id number] Yes APPLY TO THE [facility] DAY [provider] Taking Active   traMADol (ULTRAM) 50 MG tablet [id number] Yes Take 50 mg by mouth daily as needed. [provider] Taking Active   vitamin C (ASCORBIC ACID) 500 MG tablet [id number] Yes Take 1,000 mg by mouth daily.  [provider] Taking Active   XARELTO 20 MG TABS tablet [id number] Yes TAKE 1 TABLET BY MOUTH ONCE DAILY WITH SUPPER [name],  NP Taking Active             Patient Active Problem List   Diagnosis Date Noted   Chronic diastolic congestive heart failure ([place]) 12/21/2019   Diabetes 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 ([place]) 12/21/2019   History of DVT (deep vein thrombosis) 12/21/2019   Rosacea 03/24/2018   Psoriatic arthritis ([place]) 02/04/2017   Endometrial cancer ([place]) 12/04/2016   Gastroesophageal reflux disease without esophagitis 04/12/2014   Seasonal allergies 04/12/2014   Essential hypertension 04/12/2014   HLD (hyperlipidemia) 04/12/2014    Conditions to be addressed/monitored: T2DM, history of DVT, HLD, HTN  Care Plan : PharmD - Medication Management/Assistance  Updates made by Vella Raring, RPH-CPP since 12/08/2020 12:00 AM     Problem: Disease Progression      Long-Range Goal: Disease Progression Prevented or Minimized   Start Date: 09/29/2020  Expected End Date: 12/28/2020  This Visit's Progress: On track  Recent Progress: On track  Priority: High  Note:   Current Barriers:  Unable to independently afford treatment regimen Patient currently without prescription coverage Patient APPROVED for re-enrolled  in [place] Patient Assistance program for Xarelto through 10/10/2021 Fear of injections  Pharmacist Clinical Goal(s):  Over the next 90 days, patient will verbalize ability to afford treatment regimen Over the next 90 days, patient will achieve control of blood sugar control as evidenced by A1C through collaboration with PharmD and provider.   Interventions: 1:1 collaboration with [name], NP regarding development and update of comprehensive plan of care as evidenced by provider attestation and co-signature Inter-disciplinary care team collaboration (see longitudinal plan of care) Reports her husband helps manage her medications and fills weekly pillbox for patient.  T2DM: Uncontrolled based on latest A1C; current  treatment: Glipizide ER 5 mg daily with breakfast Metformin 500 mg twice daily with breakfast and supper (increased on 7/15) Reports tolerating well. Denies diarrhea; reports mild constipation recently, but managing with lifestyle changes Note patient on prednisone 5 mg daily from Rheumatology Reports recent blood sugar readings:  Before Breakfast After Breakfast After Lunch Before Supper After Supper Notes  23 - July 118 217  131    24 - July 116 210   159   25 - July 110       26 - July 120   133    27 - July 104   81* 119 *Reports sweating, felt funny with lower blood sugar readings  28 - July 101   80*    29 - July 90       Average 108        Reports continues significant changes to her diet and is using her blood sugar readings as feedback to help determine impact of dietary choices on blood sugar Stopped processed foods, limiting carbohydrates and having balanced meals Discuss factors impacting blood sugar, including diet, exercise and medications Reports has actively worked on weight loss and down ~15 lbs (home weight: 247 lbs) Reports has noticed some improvement in joint pain with weight loss and hoping to walk more.  Plans to start with walking in driveway as able to tolerate Also considering follow up with [employer] for option of swimming Consult with Webb Silversmith, NP regarding diabetes medication management to reduce risk of hypoglycemia Recommend patient stop glipizide and increase metformin 500 mg to 2 tablets (1000 mg) with breakfast and 1 tablet (500 mg) with supper for 2 weeks, then increase to 2 tablets twice with meals if able to tolerate (recommend consider change to ER dosage form if patient experiences GI side effects) Provider agrees to recommendation  Follow up with patient. Patient verbalizes understanding of plan via teachback to: STOP glipizide Increase metformin 500 mg to 2 tablets (1000 mg) with breakfast and 1 tablet (500 mg) with supper Follow up with CM  [facility] in 1 week  Hypertension: Current treatment: Losartan 50 mg daily Furosemide 40 mg daily Reports recent home readings: 7/22: 139/76, HR 64 7/23: 122/77, HR 78 7/26: 118/70, HR 71 7/27: 129/77, HR 73 7/28: 121/73, HR 74 Encourage patient to keep a log of results when checks BP at home 1-2 times/week and to bring this record with her to medical appointments  Hyperlipidemia: Current treatment: Simvastatin 10 mg QHS Omega-3 supplement daily Reported plans to follow up with provider as recommended for labs at upcoming AWV  Patient Goals/Self-Care Activities Over the next 90 days, patient will:  - collaborate with provider on medication access solutions  Follow Up Plan: Telephone follow up appointment with care management team member scheduled for: 8/5 at 10:45 am     Follow Up:  Patient agrees to Care Plan and Follow-up.  Wallace Cullens, PharmD, Para [place], CPP Clinical Pharmacist [facility] [phone]

## 2020-12-08 NOTE — Patient Instructions (Signed)
Thank you allowing the Care Management Team to be a part of your care! It was a pleasure speaking with you today!     Care Management Team    Noreene Larsson RN, MSN, CCM Nurse Care Coordinator  [phone]   Harlow Asa PharmD  Clinical Pharmacist  [phone]   Christa See, MSW, LCSW Clinical Social Worker [phone]   Visit Information   Goals Addressed             This Visit's Progress    Pharmacy Goals       Our goal A1c is less than 7%. This corresponds with fasting sugars less than 130 and 2 hour after meal sugars less than 180. Please keep a log when you check your blood sugar  Our goal bad cholesterol, or LDL, is less than 70 . This is why it is important to continue taking your simvastatin.  Please check your home blood pressure, keep a log of the results and bring this with you to your medical appointments.  Feel free to call me with any questions or concerns. I look forward to our next call!  Harlow Asa, PharmD, Para [place], CPP Clinical Pharmacist [facility] [phone]        The patient verbalized understanding of instructions, educational materials, and care plan provided today and declined offer to receive copy of patient instructions, educational materials, and care plan.   Telephone follow up appointment with care management team member scheduled for:8/5 at 10:45 am

## 2020-12-15 ENCOUNTER — Ambulatory Visit: Payer: Self-pay | Admitting: Pharmacist

## 2020-12-15 DIAGNOSIS — I10 Essential (primary) hypertension: Secondary | ICD-10-CM

## 2020-12-15 DIAGNOSIS — E0843 Diabetes 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 autonomic (poly)neuropathy: Secondary | ICD-10-CM

## 2020-12-15 NOTE — Chronic Care Management (AMB) (Signed)
Care Management   Pharmacy Note  12/15/2020 Name: Melissa Cabrera MRN: [id number] DOB: [birth year]  Subjective: Melissa Cabrera is a 65 y.o. year old female who is a primary care patient of [name], NP. The Care Management team was consulted for assistance with care management and care coordination needs.    Engaged with patient by telephone for follow up visit in response to provider referral for pharmacy case management and/or care coordination services.   The patient was given information about Care Management services today including:  Care Management services includes personalized support from designated clinical staff supervised by the patient's primary care provider, including individualized plan of care and coordination with other care providers. 24/7 contact phone numbers for assistance for urgent and routine care needs. The patient may stop case management services at any time by phone call to the office staff.  Patient agreed to services and consent obtained.  Assessment:  Review of patient status, including review of consultants reports, laboratory and other test data, was performed as part of comprehensive evaluation and provision of chronic care management services.   SDOH (Social Determinants of Health) assessments and interventions performed:  none  Objective:  Lab Results  Component Value Date   CREATININE 1.04 12/21/2019   CREATININE 0.84 04/30/2019   CREATININE 0.90 04/26/2019    Lab Results  Component Value Date   HGBA1C 7.9 (A) 09/27/2020       Component Value Date/Time   CHOL 170 12/21/2019 1149   TRIG 195.0 (H) 12/21/2019 1149   HDL 53.10 12/21/2019 1149   CHOLHDL 3 12/21/2019 1149   VLDL 39.0 12/21/2019 1149   LDLCALC 78 12/21/2019 1149   LDLDIRECT 142.0 05/16/2014 0844    BP Readings from Last 3 Encounters:  09/27/20 (!) 117/52  12/21/19 (!) 142/84  04/30/19 (!) 152/82    Care Plan  No Known Allergies  Medications Reviewed Today      Reviewed by Vella Raring, RPH-CPP (Pharmacist) on 10/06/20 at [phone]  Med List Status: <None>   Medication Order Taking? Sig Documenting Provider Last Dose Status Informant  Apremilast (OTEZLA) 30 MG TABS [id number] Yes Take 1 tablet by mouth 2 (two) times daily.  Rosita Kea, PA-C Taking Active   betamethasone dipropionate (DIPROLENE) 0.05 % ointment [id number] Yes Apply 1 application topically 2 (two) times daily as needed. psoriasis [provider] Taking Active Self  calcium carbonate (TUMS EX) 750 MG chewable tablet [id number] Yes Chew 750 mg by mouth daily. [provider] Taking Active Self  cetirizine (ZYRTEC) 10 MG tablet [id number] Yes Take 10 mg by mouth at bedtime.  [provider] Taking Active Self  Cholecalciferol 25 MCG (1000 UT) tablet [id number] Yes Take 1,000 Units by mouth daily. [provider] Taking Active   clotrimazole-betamethasone (LOTRISONE) cream [id number] Yes APPLY TOPICALLY AS DIRECTED TWICE DAILY  Patient taking differently: Apply 1 application topically 2 (two) times daily as needed (BACTERIAL INFECTION). APPLY TOPICALLY AS DIRECTED TWICE DAILY   Baity, Coralie Keens, NP Taking Active Self  diphenhydrAMINE (BENADRYL) 25 mg capsule [id number] Yes Take 50 mg by mouth at bedtime.  [provider] Taking Active Self  doxycycline (ADOXA) 100 MG tablet [id number] Yes Take 50 mg by mouth daily. [provider] Taking Active   fluocinonide ointment (LIDEX) 0.05 % [id number] Yes Apply 1 application topically 2 (two) times daily as needed (PSORIASIS BEHIND EARS).  [provider] Taking Active Self  folic acid (FOLVITE) 1 MG tablet [id number] Yes Take 1 mg  by mouth daily. [provider] Taking Active Self  furosemide (LASIX) 40 MG tablet [id number] Yes Take 1 tablet by mouth once daily [name], NP Taking Active   gabapentin (NEURONTIN) 300 MG capsule [id number] Yes Take 2 capsules (600 mg total) by mouth 3  (three) times daily. [name], NP Taking Active   glipiZIDE (GLUCOTROL XL) 5 MG 24 hr tablet [id number] Yes Take 1 tablet (5 mg total) by mouth daily with breakfast. [name], NP Taking Active   losartan (COZAAR) 50 MG tablet [id number] Yes Take 1 tablet by mouth once daily Dutch Quint B, FNP Taking Active   losartan (COZAAR) 50 MG tablet [id number] Yes Take 1 tablet (50 mg total) by mouth daily. [name], NP Taking Active   methotrexate (RHEUMATREX) 2.5 MG tablet [id number] Yes Take 25 mg by mouth every Friday. Caution:Chemotherapy. Protect from light. [provider] Taking Active Self           Med Note Channing Mutters, JENNIFER   Thu Nov 21, 2016 11:23 AM) PT AWARE TO STOP PRIOR TO PROCEDURE   nystatin ointment (MYCOSTATIN) 0000000 Yes Apply 1 application topically 2 (two) times daily. [provider] Taking Active   Omega-3 Fatty Acids (FISH OIL PO) [id number] Yes Take 1 capsule by mouth in the morning. 1040 mg of fish oil in each capsule [provider] Taking Active Self  omeprazole (PRILOSEC) 40 MG capsule [id number] Yes Take 1 capsule (40 mg total) by mouth daily.  Patient taking differently: Take 20 mg by mouth 2 (two) times daily.   [name], NP Taking Active   predniSONE (DELTASONE) 5 MG tablet [id number] Yes Take 5 mg by mouth daily. [provider] Taking Active   simvastatin (ZOCOR) 10 MG tablet [id number] Yes Take 1 tablet (10 mg total) by mouth daily. [name], NP Taking Active   SOOLANTRA 1 % CREA [id number] Yes APPLY TO THE [facility] DAY [provider] Taking Active   traMADol (ULTRAM) 50 MG tablet [id number] Yes Take 50 mg by mouth daily as needed. [provider] Taking Active   vitamin C (ASCORBIC ACID) 500 MG tablet [id number] Yes Take 1,000 mg by mouth daily.  [provider] Taking Active   XARELTO 20 MG TABS tablet [id number] Yes TAKE 1 TABLET BY MOUTH ONCE DAILY WITH SUPPER [name],  NP Taking Active             Patient Active Problem List   Diagnosis Date Noted   Chronic diastolic congestive heart failure ([place]) 12/21/2019   Diabetes 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 ([place]) 12/21/2019   History of DVT (deep vein thrombosis) 12/21/2019   Rosacea 03/24/2018   Psoriatic arthritis ([place]) 02/04/2017   Endometrial cancer ([place]) 12/04/2016   Gastroesophageal reflux disease without esophagitis 04/12/2014   Seasonal allergies 04/12/2014   Essential hypertension 04/12/2014   HLD (hyperlipidemia) 04/12/2014    Conditions to be addressed/monitored: T2DM, history of DVT, HLD, HTN  Care Plan : PharmD - Medication Management/Assistance  Updates made by Rennis Petty, RPH-CPP since 12/15/2020 12:00 AM     Problem: Disease Progression      Long-Range Goal: Disease Progression Prevented or Minimized   Start Date: 09/29/2020  Expected End Date: 12/28/2020  This Visit's Progress: On track  Recent Progress: On track  Priority: High  Note:   Current Barriers:  Unable to independently afford treatment regimen Patient currently without prescription coverage Patient APPROVED for  re-enrolled in [employer] Patient Assistance program for Xarelto through 10/10/2021 Fear of injections  Pharmacist Clinical Goal(s):  Over the next 90 days, patient will verbalize ability to afford treatment regimen Over the next 90 days, patient will achieve control of blood sugar control as evidenced by A1C through collaboration with PharmD and provider.   Interventions: 1:1 collaboration with [name], NP regarding development and update of comprehensive plan of care as evidenced by provider attestation and co-signature Inter-disciplinary care team collaboration (see longitudinal plan of care) Reports her husband helps manage her medications and fills weekly pillbox for patient. Describes still sometimes feeling heavy and tired Denies  related to blood pressure or blood sugar Reports staying hydrated Reports eating regular well-balanced meals throughout the day Encourage patient to schedule follow up visit with PCP  T2DM: Uncontrolled based on latest A1C; current treatment: Metformin 500 mg - 2 tablets (1000 mg) with breakfast and 1 tablet (500 mg) with supper  Reports tolerating well Note patient on prednisone 5 mg daily from Rheumatology Confirms stopped taking glipizide ER Reports recent blood sugar readings:  Before Breakfast After Breakfast Before Supper  30 - July 103  131  31 - July 109 178   1 - August 105    2 - August 104    3 - August 112    4 - August 104    5 - August 106    Average 106     Reports continues significant changes to her diet and is using her blood sugar readings as feedback to help determine impact of dietary choices on blood sugar Stopped processed foods, limiting carbohydrates and having balanced meals Discuss factors impacting blood sugar, including diet, exercise and medications Reports continues to work on weight loss and recently obtained a new scale to track her weight at home Reports has noticed some improvement in joint pain with weight loss and hoping to walk more.  Plans to start with walking in driveway as able to tolerate Also have discussed option to follow up with [employer] for option of swimming   Hypertension: Current treatment: Losartan 50 mg daily Furosemide 40 mg daily Denies checking home BP this week Encourage patient to keep a log of results when checks BP at home 1-2 times/week and to bring this record with her to medical appointments  Hyperlipidemia: Current treatment: Simvastatin 10 mg QHS Omega-3 supplement daily Reported plans to follow up with provider as recommended for labs at upcoming AWV  Patient Goals/Self-Care Activities Over the next 90 days, patient will:  - collaborate with provider on medication access solutions  Follow Up Plan: Telephone  follow up appointment with care management team member scheduled for: 9/7 at 8:30 am      Follow Up:  Patient agrees to Care Plan and Follow-up.  Wallace Cullens, PharmD, Para [place], CPP Clinical Pharmacist [facility] [phone]

## 2020-12-15 NOTE — Patient Instructions (Signed)
Visit Information  PATIENT GOALS:  Goals Addressed             This Visit's Progress    Pharmacy Goals       Our goal A1c is less than 7%. This corresponds with fasting sugars less than 130 and 2 hour after meal sugars less than 180. Please keep a log when you check your blood sugar  Our goal bad cholesterol, or LDL, is less than 70 . This is why it is important to continue taking your simvastatin.  Please check your home blood pressure, keep a log of the results and bring this with you to your medical appointments.  Feel free to call me with any questions or concerns. I look forward to our next call!  Wallace Cullens, PharmD, Para March, CPP Clinical Pharmacist [facility] [phone]        The patient verbalized understanding of instructions, educational materials, and care plan provided today and declined offer to receive copy of patient instructions, educational materials, and care plan.   Telephone follow up appointment with care management team member scheduled for: 9/7 at 8:30 am

## 2021-01-17 ENCOUNTER — Ambulatory Visit: Payer: Medicaid Other | Admitting: Pharmacist

## 2021-01-17 DIAGNOSIS — I10 Essential (primary) hypertension: Secondary | ICD-10-CM

## 2021-01-17 DIAGNOSIS — E0843 Diabetes 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 autonomic (poly)neuropathy: Secondary | ICD-10-CM

## 2021-01-17 NOTE — Patient Instructions (Signed)
Thank you allowing the Care Management Team to be a part of your care! It was a pleasure speaking with you today!     Care Management Team    Noreene Larsson RN, MSN, CCM Nurse Care Coordinator  [phone]   Wallace Cullens PharmD  Clinical Pharmacist  [phone]   Christa See, MSW, LCSW Clinical Social Worker [phone]   Visit Information   Goals Addressed             This Visit's Progress    Pharmacy Goals       Our goal A1c is less than 7%. This corresponds with fasting sugars less than 130 and 2 hour after meal sugars less than 180. Please keep a log when you check your blood sugar  Our goal bad cholesterol, or LDL, is less than 70 . This is why it is important to continue taking your simvastatin.  Please check your home blood pressure, keep a log of the results and bring this with you to your medical appointments.  Feel free to call me with any questions or concerns. I look forward to our next call!   Wallace Cullens, PharmD, Para March, CPP Clinical Pharmacist [facility] [phone]        The patient verbalized understanding of instructions, educational materials, and care plan provided today and declined offer to receive copy of patient instructions, educational materials, and care plan.   Telephone follow up appointment with care management team member scheduled for: 10/17 at 8:30 am

## 2021-01-17 NOTE — Chronic Care Management (AMB) (Signed)
Care Management   Pharmacy Note  01/17/2021 Name: Melissa Cabrera MRN: [id number] DOB: 12/27/1955  Subjective: Melissa Cabrera is a 65 y.o. year old female who is a primary care patient of [name], NP. The Care Management team was consulted for assistance with care management and care coordination needs.    Engaged with patient by telephone for follow up visit in response to provider referral for pharmacy case management and/or care coordination services.   The patient was given information about Care Management services today including:  Care Management services includes personalized support from designated clinical staff supervised by the patient's primary care provider, including individualized plan of care and coordination with other care providers. 24/7 contact phone numbers for assistance for urgent and routine care needs. The patient may stop case management services at any time by phone call to the office staff.  Patient agreed to services and consent obtained.  Assessment:  Review of patient status, including review of consultants reports, laboratory and other test data, was performed as part of comprehensive evaluation and provision of chronic care management services.   SDOH (Social Determinants of Health) assessments and interventions performed:  none  Objective:  Lab Results  Component Value Date   CREATININE 1.04 12/21/2019   CREATININE 0.84 04/30/2019   CREATININE 0.90 04/26/2019    Lab Results  Component Value Date   HGBA1C 7.9 (A) 09/27/2020       Component Value Date/Time   CHOL 170 12/21/2019 1149   TRIG 195.0 (H) 12/21/2019 1149   HDL 53.10 12/21/2019 1149   CHOLHDL 3 12/21/2019 1149   VLDL 39.0 12/21/2019 1149   LDLCALC 78 12/21/2019 1149   BP Readings from Last 3 Encounters:  09/27/20 (!) 117/52  12/21/19 (!) 142/84  04/30/19 (!) 152/82    Care Plan  No Known Allergies  Medications Reviewed Today     Reviewed by Vella Raring,  RPH-CPP (Pharmacist) on 10/06/20 at [phone]  Med List Status: <None>   Medication Order Taking? Sig Documenting Provider Last Dose Status Informant  Apremilast (OTEZLA) 30 MG TABS [id number] Yes Take 1 tablet by mouth 2 (two) times daily.  Rosita Kea, PA-C Taking Active   betamethasone dipropionate (DIPROLENE) 0.05 % ointment [id number] Yes Apply 1 application topically 2 (two) times daily as needed. psoriasis [provider] Taking Active Self  calcium carbonate (TUMS EX) 750 MG chewable tablet [id number] Yes Chew 750 mg by mouth daily. [provider] Taking Active Self  cetirizine (ZYRTEC) 10 MG tablet [id number] Yes Take 10 mg by mouth at bedtime.  [provider] Taking Active Self  Cholecalciferol 25 MCG (1000 UT) tablet [id number] Yes Take 1,000 Units by mouth daily. [provider] Taking Active   clotrimazole-betamethasone (LOTRISONE) cream [id number] Yes APPLY TOPICALLY AS DIRECTED TWICE DAILY  Patient taking differently: Apply 1 application topically 2 (two) times daily as needed (BACTERIAL INFECTION). APPLY TOPICALLY AS DIRECTED TWICE DAILY   Baity, Coralie Keens, NP Taking Active Self  diphenhydrAMINE (BENADRYL) 25 mg capsule [id number] Yes Take 50 mg by mouth at bedtime.  [provider] Taking Active Self  doxycycline (ADOXA) 100 MG tablet [id number] Yes Take 50 mg by mouth daily. [provider] Taking Active   fluocinonide ointment (LIDEX) 0.05 % [id number] Yes Apply 1 application topically 2 (two) times daily as needed (PSORIASIS BEHIND EARS).  [provider] Taking Active Self  folic acid (FOLVITE) 1 MG tablet [id number] Yes Take 1 mg by mouth daily. [provider] Taking  Active Self  furosemide (LASIX) 40 MG tablet [id number] Yes Take 1 tablet by mouth once daily [name], NP Taking Active   gabapentin (NEURONTIN) 300 MG capsule [id number] Yes Take 2 capsules (600 mg total) by mouth 3 (three) times daily. [name], NP Taking Active   glipiZIDE (GLUCOTROL XL) 5 MG 24 hr tablet [id number] Yes Take 1 tablet (5 mg total) by mouth daily with breakfast. [name], NP Taking Active   losartan (COZAAR) 50 MG tablet [id number] Yes Take 1 tablet by mouth once daily Dutch Quint B, FNP Taking Active   losartan (COZAAR) 50 MG tablet [id number] Yes Take 1 tablet (50 mg total) by mouth daily. [name], NP Taking Active   methotrexate (RHEUMATREX) 2.5 MG tablet [id number] Yes Take 25 mg by mouth every Friday. Caution:Chemotherapy. Protect from light. [provider] Taking Active Self           Med Note Channing Mutters, JENNIFER   Thu Nov 21, 2016 11:23 AM) PT AWARE TO STOP PRIOR TO PROCEDURE   nystatin ointment (MYCOSTATIN) 0000000 Yes Apply 1 application topically 2 (two) times daily. [provider] Taking Active   Omega-3 Fatty Acids (FISH OIL PO) [id number] Yes Take 1 capsule by mouth in the morning. 1040 mg of fish oil in each capsule [provider] Taking Active Self  omeprazole (PRILOSEC) 40 MG capsule [id number] Yes Take 1 capsule (40 mg total) by mouth daily.  Patient taking differently: Take 20 mg by mouth 2 (two) times daily.   [name], NP Taking Active   predniSONE (DELTASONE) 5 MG tablet [id number] Yes Take 5 mg by mouth daily. [provider] Taking Active   simvastatin (ZOCOR) 10 MG tablet [id number] Yes Take 1 tablet (10 mg total) by mouth daily. [name], NP Taking Active   SOOLANTRA 1 % CREA [id number] Yes APPLY TO THE [facility] DAY [provider] Taking Active   traMADol (ULTRAM) 50 MG tablet [id number] Yes Take 50 mg by mouth daily as needed. [provider] Taking Active   vitamin C (ASCORBIC ACID) 500 MG tablet [id number] Yes Take 1,000 mg by mouth daily.  [provider] Taking Active   XARELTO 20 MG TABS tablet [id number] Yes TAKE 1 TABLET BY MOUTH ONCE DAILY WITH SUPPER [name], NP Taking Active              Patient Active Problem List   Diagnosis Date Noted   Chronic diastolic congestive heart failure ([place]) 12/21/2019   Diabetes 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 ([place]) 12/21/2019   History of DVT (deep vein thrombosis) 12/21/2019   Rosacea 03/24/2018   Psoriatic arthritis ([place]) 02/04/2017   Endometrial cancer ([place]) 12/04/2016   Gastroesophageal reflux disease without esophagitis 04/12/2014   Seasonal allergies 04/12/2014   Essential hypertension 04/12/2014   HLD (hyperlipidemia) 04/12/2014    Conditions to be addressed/monitored: T2DM, history of DVT, HLD, HTN  Care Plan : PharmD - Medication Management/Assistance  Updates made by Rennis Petty, RPH-CPP since 01/17/2021 12:00 AM     Problem: Disease Progression      Long-Range Goal: Disease Progression Prevented or Minimized   Start Date: 09/29/2020  Expected End Date: 12/28/2020  This Visit's Progress: On track  Recent Progress: On track  Priority: High  Note:   Current Barriers:  Unable to independently afford treatment regimen Patient currently without prescription coverage Patient APPROVED for re-enrolled in [place] Patient Assistance  program for Xarelto through 10/10/2021 Fear of injections  Pharmacist Clinical Goal(s):  Over the next 90 days, patient will verbalize ability to afford treatment regimen Over the next 90 days, patient will achieve control of blood sugar control as evidenced by A1C through collaboration with PharmD and provider.   Interventions: 1:1 collaboration with [name], NP regarding development and update of comprehensive plan of care as evidenced by provider attestation and co-signature Inter-disciplinary care team collaboration (see longitudinal plan of care) Reports her husband helps manage her medications and fills weekly pillbox for patient. Reports feeling better, not having same "heavy" feeling as in the past  T2DM: Uncontrolled  based on latest A1C; current treatment: Metformin 500 mg - 2 tablets (1000 mg) with breakfast and 1 tablet (500 mg) with supper  Note patient on prednisone 5 mg daily from Rheumatology Reports recent blood sugar readings:  Before Breakfast After Breakfast Before Supper  1 - September 106    2 - September 108    3 - September 114    4 - September 116    5 - September 110  121  6 - September 115    7 - September 119    Average 112     Reports continues significant changes to her diet and using her blood sugar readings as feedback to help determine impact of dietary choices on blood sugar Stopped processed foods, limiting carbohydrates and having balanced meals Discuss factors impacting blood sugar, including diet, exercise and medications Congratulate patient as she reports has continued to work on weight loss and current weight ~233 lbs (previously 262 lbs in office on 5/18) Reports notices improvement in joint pain with weight loss and able to walk more Also have discussed option to follow up with [employer] for option of swimming Will mail patient additional blood sugar log pages as requested  Hypertension: Current treatment: Losartan 50 mg daily Furosemide 40 mg daily Denies checking home BP recently Denies symptoms of hypotension or hypertension Reports limiting salt/sodium in her diet Encourage patient to keep a log of results when checks BP at home 1-2 times/week and to bring this record with her to medical appointments  Hyperlipidemia: Current treatment: Simvastatin 10 mg QHS Omega-3 supplement daily Reported plans to follow up with provider as recommended for labs at upcoming AW[facility]  Patient Goals/Self-Care Activities Over the next 90 days, patient will:  - collaborate with provider on medication access solutions  Follow Up Plan: Telephone follow up appointment with care management team member scheduled for: 10/17 at 8:30 am       Follow Up:  Patient agrees to Care Plan  and Follow-up.  Wallace Cullens, PharmD, Para March, CPP Clinical Pharmacist [facility] [phone]

## 2021-01-19 ENCOUNTER — Other Ambulatory Visit: Payer: Self-pay | Admitting: Internal Medicine

## 2021-01-19 NOTE — Telephone Encounter (Signed)
Requested medication (s) are due for refill today: Yes  Requested medication (s) are on the active medication list: Yes  Last refill:  11/09/20  Future visit scheduled: Yes  Notes to clinic:  Unable to refill per protocol due to failed labs, no updated results per protocol      Requested Prescriptions  Pending Prescriptions Disp Refills   metFORMIN (GLUCOPHAGE) 500 MG tablet [Pharmacy Med Name: metFORMIN HCl 500 MG Oral Tablet] 180 tablet 0    Sig: TAKE 1 TABLET BY MOUTH ONCE DAILY WITH BREAKFAST FOR 14 DAYS, THEN INCREASE TO 1 TABLET TWICE DAILY WITH A MEAL FOR 14 DAYS     Endocrinology:  Diabetes - Biguanides Failed - 01/19/2021 12:39 PM      Failed - Cr in normal range and within 360 days    Creatinine, Ser  Date Value Ref Range Status  12/21/2019 1.04 0.40 - 1.20 mg/dL Final   Creatinine,U  Date Value Ref Range Status  10/29/2016 153.4 mg/dL Final          Failed - eGFR in normal range and within 360 days    GFR calc Af Amer  Date Value Ref Range Status  04/30/2019 >60 >60 mL/min Final   GFR calc non Af Amer  Date Value Ref Range Status  04/30/2019 >60 >60 mL/min Final   GFR  Date Value Ref Range Status  12/21/2019 53.40 (L) >60.00 mL/min Final          Passed - HBA1C is between 0 and 7.9 and within 180 days    Hemoglobin A1C  Date Value Ref Range Status  09/27/2020 7.9 (A) 4.0 - 5.6 % Final   Hgb A1c MFr Bld  Date Value Ref Range Status  12/21/2019 7.5 (H) 4.6 - 6.5 % Final    Comment:    Glycemic Control Guidelines for People with Diabetes:Non Diabetic:  <6%Goal of Therapy: <7%Additional Action Suggested:  >8%           Passed - Valid encounter within last 6 months    Recent Outpatient Visits           3 months ago Diabetes 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

## 2021-01-23 ENCOUNTER — Other Ambulatory Visit: Payer: Self-pay | Admitting: Internal Medicine

## 2021-01-23 MED ORDER — METFORMIN HCL 500 MG PO TABS
500.0000 mg | ORAL_TABLET | Freq: Two times a day (BID) | ORAL | 0 refills | Status: DC
Start: 2021-01-23 — End: 2021-01-25

## 2021-01-25 ENCOUNTER — Encounter: Payer: Self-pay | Admitting: Internal Medicine

## 2021-02-05 ENCOUNTER — Telehealth: Payer: Self-pay

## 2021-02-26 ENCOUNTER — Ambulatory Visit: Payer: Medicaid Other | Admitting: Pharmacist

## 2021-02-26 DIAGNOSIS — E0843 Diabetes 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 autonomic (poly)neuropathy: Secondary | ICD-10-CM

## 2021-02-26 DIAGNOSIS — I10 Essential (primary) hypertension: Secondary | ICD-10-CM

## 2021-02-26 NOTE — Patient Instructions (Signed)
Thank you allowing the Care Management Team to be a part of your care! It was a pleasure speaking with you today!     Care Management Team    Noreene Larsson RN, MSN, CCM Nurse Care Coordinator  [phone]   Wallace Cullens, PharmD  Clinical Pharmacist  [phone]   Christa See, MSW, LCSW Clinical Social Worker [phone]   Visit Information   Goals Addressed             This Visit's Progress    Pharmacy Goals       Our goal A1c is less than 7%. This corresponds with fasting sugars less than 130 and 2 hour after meal sugars less than 180. Please keep a log when you check your blood sugar  Our goal bad cholesterol, or LDL, is less than 70 . This is why it is important to continue taking your simvastatin.  Please check your home blood pressure, keep a log of the results and bring this with you to your medical appointments.  Feel free to call me with any questions or concerns. I look forward to our next call!  Wallace Cullens, PharmD, Para March, CPP Clinical Pharmacist [facility] [phone]        The patient verbalized understanding of instructions, educational materials, and care plan provided today and declined offer to receive copy of patient instructions, educational materials, and care plan.   Telephone follow up appointment with care management team member scheduled for: 12/12 at 8:30 am

## 2021-02-26 NOTE — Chronic Care Management (AMB) (Signed)
Care Management   Pharmacy Note  02/26/2021 Name: Melissa Cabrera MRN: [id number] DOB: Nov 23, 1955  Subjective: Melissa Cabrera is a 65 y.o. year old female who is a primary care patient of [name], NP. The Care Management team was consulted for assistance with care management and care coordination needs.    Engaged with patient by telephone for follow up visit in response to provider referral for pharmacy case management and/or care coordination services.   The patient was given information about Care Management services today including:  Care Management services includes personalized support from designated clinical staff supervised by the patient's primary care provider, including individualized plan of care and coordination with other care providers. 24/7 contact phone numbers for assistance for urgent and routine care needs. The patient may stop case management services at any time by phone call to the office staff.  Patient agreed to services and consent obtained.  Assessment:  Review of patient status, including review of consultants reports, laboratory and other test data, was performed as part of comprehensive evaluation and provision of chronic care management services.   SDOH (Social Determinants of Health) assessments and interventions performed: yes SDOH Interventions    Flowsheet Row Most Recent Value  SDOH Interventions   SDOH Interventions for the Following Domains Physical Activity  Physical Activity Interventions Other (Comments)  [Encourage patient to increase physical activity]        Objective:  Lab Results  Component Value Date   CREATININE 1.04 12/21/2019   CREATININE 0.84 04/30/2019   CREATININE 0.90 04/26/2019    Lab Results  Component Value Date   HGBA1C 7.9 (A) 09/27/2020       Component Value Date/Time   CHOL 170 12/21/2019 1149   TRIG 195.0 (H) 12/21/2019 1149   HDL 53.10 12/21/2019 1149   CHOLHDL 3 12/21/2019 1149   VLDL 39.0  12/21/2019 1149   LDLCALC 78 12/21/2019 1149   LDLDIRECT 142.0 05/16/2014 0844     BP Readings from Last 3 Encounters:  09/27/20 (!) 117/52  12/21/19 (!) 142/84  04/30/19 (!) 152/82    Care Plan  No Known Allergies  Medications Reviewed Today     Reviewed by Vella Raring, RPH-CPP (Pharmacist) on 10/06/20 at [phone]  Med List Status: <None>   Medication Order Taking? Sig Documenting Provider Last Dose Status Informant  Apremilast (OTEZLA) 30 MG TABS [id number] Yes Take 1 tablet by mouth 2 (two) times daily.  Rosita Kea, PA-C Taking Active   betamethasone dipropionate (DIPROLENE) 0.05 % ointment [id number] Yes Apply 1 application topically 2 (two) times daily as needed. psoriasis [provider] Taking Active Self  calcium carbonate (TUMS EX) 750 MG chewable tablet [id number] Yes Chew 750 mg by mouth daily. [provider] Taking Active Self  cetirizine (ZYRTEC) 10 MG tablet [id number] Yes Take 10 mg by mouth at bedtime.  [provider] Taking Active Self  Cholecalciferol 25 MCG (1000 UT) tablet [id number] Yes Take 1,000 Units by mouth daily. [provider] Taking Active   clotrimazole-betamethasone (LOTRISONE) cream [id number] Yes APPLY TOPICALLY AS DIRECTED TWICE DAILY  Patient taking differently: Apply 1 application topically 2 (two) times daily as needed (BACTERIAL INFECTION). APPLY TOPICALLY AS DIRECTED TWICE DAILY   Baity, Coralie Keens, NP Taking Active Self  diphenhydrAMINE (BENADRYL) 25 mg capsule [id number] Yes Take 50 mg by mouth at bedtime.  [provider] Taking Active Self  doxycycline (ADOXA) 100 MG tablet [id number] Yes Take 50 mg by mouth daily. [provider] Taking  Active   fluocinonide ointment (LIDEX) 0.05 % [id number] Yes Apply 1 application topically 2 (two) times daily as needed (PSORIASIS BEHIND EARS).  [provider] Taking Active Self  folic acid (FOLVITE) 1 MG tablet [id number] Yes Take 1 mg by mouth  daily. [provider] Taking Active Self  furosemide (LASIX) 40 MG tablet [id number] Yes Take 1 tablet by mouth once daily [name], NP Taking Active   gabapentin (NEURONTIN) 300 MG capsule [id number] Yes Take 2 capsules (600 mg total) by mouth 3 (three) times daily. [name], NP Taking Active   glipiZIDE (GLUCOTROL XL) 5 MG 24 hr tablet [id number] Yes Take 1 tablet (5 mg total) by mouth daily with breakfast. [name], NP Taking Active   losartan (COZAAR) 50 MG tablet [id number] Yes Take 1 tablet by mouth once daily Dutch Quint B, FNP Taking Active   losartan (COZAAR) 50 MG tablet [id number] Yes Take 1 tablet (50 mg total) by mouth daily. [name], NP Taking Active   methotrexate (RHEUMATREX) 2.5 MG tablet [id number] Yes Take 25 mg by mouth every Friday. Caution:Chemotherapy. Protect from light. [provider] Taking Active Self           Med Note Channing Mutters, JENNIFER   Thu Nov 21, 2016 11:23 AM) PT AWARE TO STOP PRIOR TO PROCEDURE   nystatin ointment (MYCOSTATIN) [id number] Yes Apply 1 application topically 2 (two) times daily. [provider] Taking Active   Omega-3 Fatty Acids (FISH OIL PO) [id number] Yes Take 1 capsule by mouth in the morning. 1040 mg of fish oil in each capsule [provider] Taking Active Self  omeprazole (PRILOSEC) 40 MG capsule [id number] Yes Take 1 capsule (40 mg total) by mouth daily.  Patient taking differently: Take 20 mg by mouth 2 (two) times daily.   [name], NP Taking Active   predniSONE (DELTASONE) 5 MG tablet [id number] Yes Take 5 mg by mouth daily. [provider] Taking Active   simvastatin (ZOCOR) 10 MG tablet [id number] Yes Take 1 tablet (10 mg total) by mouth daily. [name], NP Taking Active   SOOLANTRA 1 % CREA [id number] Yes APPLY TO THE [facility] DAY [provider] Taking Active   traMADol (ULTRAM) 50 MG tablet [id number] Yes Take 50 mg by mouth daily as needed.  [provider] Taking Active   vitamin C (ASCORBIC ACID) 500 MG tablet [id number] Yes Take 1,000 mg by mouth daily.  [provider] Taking Active   XARELTO 20 MG TABS tablet [id number] Yes TAKE 1 TABLET BY MOUTH ONCE DAILY WITH SUPPER [name], NP Taking Active             Patient Active Problem List   Diagnosis Date Noted   Chronic diastolic congestive heart failure ([place]) 12/21/2019   Diabetes 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 ([place]) 12/21/2019   History of DVT (deep vein thrombosis) 12/21/2019   Rosacea 03/24/2018   Psoriatic arthritis ([place]) 02/04/2017   Endometrial cancer ([employer]) 12/04/2016   Gastroesophageal reflux disease without esophagitis 04/12/2014   Seasonal allergies 04/12/2014   Essential hypertension 04/12/2014   HLD (hyperlipidemia) 04/12/2014    Conditions to be addressed/monitored: History of DVT, T2DM, HLD, HTN  Care Plan : PharmD - Medication Management/Assistance  Updates made by Rennis Petty, RPH-CPP since 02/26/2021 12:00 AM     Problem: Disease Progression      Long-Range Goal: Disease Progression Prevented or Minimized   Start  Date: 09/29/2020  Expected End Date: 12/28/2020  This Visit's Progress: On track  Recent Progress: On track  Priority: High  Note:   Current Barriers:  Unable to independently afford treatment regimen Patient currently without prescription coverage Patient APPROVED for re-enrolled in [place] Patient Assistance program for Xarelto through 10/10/2021 Fear of injections  Pharmacist Clinical Goal(s):  Over the next 90 days, patient will verbalize ability to afford treatment regimen Over the next 90 days, patient will achieve control of blood sugar control as evidenced by A1C through collaboration with PharmD and provider.   Interventions: 1:1 collaboration with [name], NP regarding development and update of comprehensive plan of care as  evidenced by provider attestation and co-signature Inter-disciplinary care team collaboration (see longitudinal plan of care) Reports her husband helps manage her medications and fills weekly pillbox for patient. Patient looking at signing up for Medicare and asks about resources for deciding on a plan Provide patient with contact information for the Seniors? Health Insurance Information Program [facility]) Reports recently having chronic dry mouth. Has been using sugar-free gum to help with saliva production Note patient has reported takes both diphenhydramine and cetirizine daily as recommended by dermatologist for ezcema Reports has follow up appointment with dermatologist at the end of this month. Encourage patient to discuss recent chronic dry mouth and current antihistamine regimen with dermatologist at upcoming appointment States not interested in receiving influenza vaccine  T2DM: Uncontrolled based on latest A1C; current treatment: Metformin 500 mg - 2 tablets (1000 mg) with breakfast and 1 tablet (500 mg) with supper  Note patient on prednisone 5 mg daily from Rheumatology Reports recent blood sugar readings:  Before Breakfast Notes  11 - October 138   12 - October 159* *Apple sauce snack evening prior  13 - October 127   14 - October 124   15 - October 118   16 - October 149* *Different eating habits on vacation  17 - October 128   Average 135     Reports continues significant changes to her diet and using her blood sugar readings as feedback to help determine impact of dietary choices on blood sugar Stopped processed foods, limiting carbohydrates and having balanced meals Discuss factors impacting blood sugar, including diet, exercise and medications Note patient previously reported weight loss of ~30 lbs since May. Reports currently maintaining this weight loss Exercise: Patient considering getting back to going to the gym [employer]). Plans to ask daugher Tia to go with  her. Also discuss walking around home Patient considering obtaining elliptical machine to use at home Patient requests assistance with receiving updated Rx for metformin to reflect current dosing. Note Rx updated by PCP on 9/15, but Rx did not transmit to pharmacy. Will collaborate with PCP Eye exam: States will get this setup as soon as has Medicare coverage  Hypertension: Current treatment: Losartan 50 mg daily Furosemide 40 mg daily Reports last checked home BP: 9/7: 124/79, HR 76 9/8: 121/78, HR 81 9/16 126/71, HR 75 Denies symptoms of hypotension or hypertension Have discussed limiting salt/sodium in her diet Encourage patient to keep a log of results when checks BP at home 1-2 times/week and to bring this record with her to medical appointments  Hyperlipidemia: Current treatment: Simvastatin 10 mg QHS Omega-3 supplement daily Reported plans to follow up with provider as recommended for labs at upcoming AWV  Patient Goals/Self-Care Activities Over the next 90 days, patient will:  - collaborate with provider on medication access solutions  Follow Up Plan: Telephone follow up appointment with care management team member scheduled for: 12/12 at 8:30 am      Follow Up:  Patient agrees to Care Plan and Follow-up.  Wallace Cullens, PharmD, Para [place], CPP Clinical Pharmacist [facility] [phone]

## 2021-02-28 ENCOUNTER — Other Ambulatory Visit: Payer: Self-pay

## 2021-02-28 MED ORDER — METFORMIN HCL 500 MG PO TABS: ORAL_TABLET | ORAL | 0 refills | Status: DC

## 2021-03-05 ENCOUNTER — Other Ambulatory Visit: Payer: Self-pay | Admitting: Internal Medicine

## 2021-03-06 NOTE — Telephone Encounter (Signed)
Requested medication (s) are due for refill today: yes  Requested medication (s) are on the active medication list: yes  Last refill:  12/05/20 #90  Future visit scheduled: yes  Notes to clinic:  overdue lab work   Requested Prescriptions  Pending Prescriptions Disp Refills   furosemide (LASIX) 40 MG tablet [Pharmacy Med Name: Furosemide 40 MG Oral Tablet] 90 tablet 0    Sig: Take 1 tablet by mouth once daily     Cardiovascular:  Diuretics - Loop Failed - 03/06/2021  9:28 AM      Failed - K in normal range and within 360 days    Potassium  Date Value Ref Range Status  12/21/2019 4.4 3.5 - 5.1 mEq/L Final          Failed - Ca in normal range and within 360 days    Calcium  Date Value Ref Range Status  12/21/2019 9.4 8.4 - 10.5 mg/dL Final          Failed - Na in normal range and within 360 days    Sodium  Date Value Ref Range Status  12/21/2019 140 135 - 145 mEq/L Final  01/07/2014 143 137 - 147 mmol/L Final          Failed - Cr in normal range and within 360 days    Creatinine, Ser  Date Value Ref Range Status  12/21/2019 1.04 0.40 - 1.20 mg/dL Final   Creatinine,U  Date Value Ref Range Status  10/29/2016 153.4 mg/dL Final          Passed - Last BP in normal range    BP Readings from Last 1 Encounters:  09/27/20 (!) 117/52          Passed - Valid encounter within last 6 months    Recent Outpatient Visits           5 months ago 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

## 2021-04-02 ENCOUNTER — Encounter: Payer: Self-pay | Admitting: Internal Medicine

## 2021-04-10 ENCOUNTER — Other Ambulatory Visit: Payer: Self-pay

## 2021-04-10 ENCOUNTER — Encounter: Payer: Self-pay | Admitting: Internal Medicine

## 2021-04-10 ENCOUNTER — Ambulatory Visit (INDEPENDENT_AMBULATORY_CARE_PROVIDER_SITE_OTHER): Payer: Medicare Other | Admitting: Internal Medicine

## 2021-04-10 VITALS — BP 126/68 | HR 87 | Temp 97.8°F | Resp 18 | Ht 60.0 in | Wt 224.0 lb

## 2021-04-10 DIAGNOSIS — Z1211 Encounter for screening for malignant neoplasm of colon: Secondary | ICD-10-CM | POA: Diagnosis not present

## 2021-04-10 DIAGNOSIS — E0843 Diabetes 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 autonomic (poly)neuropathy: Secondary | ICD-10-CM

## 2021-04-10 DIAGNOSIS — Z0000 Encounter for general adult medical examination without abnormal findings: Secondary | ICD-10-CM | POA: Diagnosis not present

## 2021-04-10 DIAGNOSIS — Z1231 Encounter for screening mammogram for malignant neoplasm of breast: Secondary | ICD-10-CM

## 2021-04-10 NOTE — Progress Notes (Signed)
HPI:  Patient presents the clinic today for her subsequent annual Medicare wellness exam.  Past Medical History:  Diagnosis Date   Allergy    Arthritis    Cancer ([place])    endometrial   Chicken pox    Diabetes mellitus without complication (HCC)    GERD (gastroesophageal reflux disease)    Heart murmur    DUE TO RHEUMATIC FEVER PER PT   Hyperlipidemia    Hypertension    Psoriasis    Rheumatic fever    Rosacea     Current Outpatient Medications  Medication Sig Dispense Refill   furosemide (LASIX) 40 MG tablet Take 1 tablet by mouth once daily 90 tablet 2   acetaminophen (TYLENOL) 650 MG CR tablet Take 1,300 mg by mouth at bedtime.     Apremilast (OTEZLA) 30 MG TABS Take 1 tablet by mouth 2 (two) times daily.      betamethasone dipropionate (DIPROLENE) 0.05 % ointment Apply 1 application topically 2 (two) times daily as needed. psoriasis     calcium carbonate (TUMS EX) 750 MG chewable tablet Chew 750 mg by mouth daily.     cetirizine (ZYRTEC) 10 MG tablet Take 10 mg by mouth at bedtime.      Cholecalciferol 25 MCG (1000 UT) tablet Take 1,000 Units by mouth daily.     clotrimazole-betamethasone (LOTRISONE) cream APPLY TOPICALLY AS DIRECTED TWICE DAILY (Patient taking differently: Apply 1 application topically 2 (two) times daily as needed (BACTERIAL INFECTION). APPLY TOPICALLY AS DIRECTED TWICE DAILY) 60 g 5   diphenhydrAMINE (BENADRYL) 25 mg capsule Take 50 mg by mouth at bedtime.      doxycycline (ADOXA) 100 MG tablet Take 50 mg by mouth daily.     fluocinonide ointment (LIDEX) 1.06 % Apply 1 application topically 2 (two) times daily as needed (PSORIASIS BEHIND EARS).      folic acid (FOLVITE) 1 MG tablet Take 1 mg by mouth daily.     gabapentin (NEURONTIN) 300 MG capsule Take 2 capsules (600 mg total) by mouth 3 (three) times daily. 540 capsule 1   losartan (COZAAR) 50 MG tablet Take 1 tablet (50 mg total) by mouth daily. 90 tablet 1   metFORMIN (GLUCOPHAGE) 500 MG tablet Take 2  tabs PO in am and 1 tab PO in pm 270 tablet 0   methotrexate (RHEUMATREX) 2.5 MG tablet Take 25 mg by mouth every Friday. Caution:Chemotherapy. Protect from light.     nystatin ointment (MYCOSTATIN) Apply 1 application topically 2 (two) times daily.     Omega-3 Fatty Acids (FISH OIL PO) Take 1 capsule by mouth in the morning. 1040 mg of fish oil in each capsule     omeprazole (PRILOSEC) 40 MG capsule Take 1 capsule (40 mg total) by mouth daily. (Patient taking differently: Take 20 mg by mouth 2 (two) times daily.) 90 capsule 0   predniSONE (DELTASONE) 5 MG tablet Take 5 mg by mouth daily.     rivaroxaban (XARELTO) 20 MG TABS tablet Take 1 tablet (20 mg total) by mouth daily with supper. 90 tablet 1   simvastatin (ZOCOR) 10 MG tablet Take 1 tablet (10 mg total) by mouth daily. 90 tablet 1   SOOLANTRA 1 % CREA APPLY TO THE FACE EVERY DAY  6   traMADol (ULTRAM) 50 MG tablet Take 50 mg by mouth daily as needed.     vitamin C (ASCORBIC ACID) 500 MG tablet Take 1,000 mg by mouth daily.      Zinc 25 MG TABS  Take by mouth.     No current facility-administered medications for this visit.    No Known Allergies  Family History  Problem Relation Age of Onset   Cancer Mother        skin   Dementia Mother    Cancer Sister        skin and liver   Stroke Maternal Grandmother    Skin cancer Brother    Diabetes Neg Hx    Heart disease Neg Hx    Breast cancer Neg Hx     Social History   Socioeconomic History   Marital status: Married    Spouse name: Not on file   Number of children: Not on file   Years of education: Not on file   Highest education level: Not on file  Occupational History   Not on file  Tobacco Use   Smoking status: Former    Packs/day: 4.00    Years: 16.00    Pack years: 64.00    Types: Cigarettes    Quit date: 09/12/1995    Years since quitting: 25.5   Smokeless tobacco: Never   Tobacco comments:    quit in 1997  Vaping Use   Vaping Use: Never used  Substance and  Sexual Activity   Alcohol use: No    Alcohol/week: 0.0 standard drinks   Drug use: No   Sexual activity: Yes    Birth control/protection: None  Other Topics Concern   Not on file  Social History Narrative   Not on file   Social Determinants of Health   Financial Resource Strain: Not on file  Food Insecurity: Not on file  Transportation Needs: Not on file  Physical Activity: Not on file  Stress: Not on file  Social Connections: Not on file  Intimate Partner Violence: Not on file    Hospitiliaztions: None  Health Maintenance:    Flu: never  Tetanus: unsure  Pneumovax: never  Prevnar: never  Covid: never  Shingrix: never  Mammogram: 05/2018  Pap Smear: hysterectomy  Bone Density: 05/2018  Colon Screening: never  Eye Doctor: as needed  Dental Exam: as needed   Providers:   PCP: Webb Silversmith, NP  Rheumatologist: Dr. Trudie Reed  Podiatry: Dr. Amalia Hailey  Cardiologist: Dr. Rockey Situ  Neurosurgery: Dr. Cari Caraway  GYN: Dr. Amalia Hailey   I have personally reviewed and have noted:  1. The patient's medical and social history 2. Their use of alcohol, tobacco or illicit drugs 3. Their current medications and supplements 4. The patient's functional ability including ADL's, fall risks, home safety risks and hearing or visual impairment. 5. Diet and physical activities 6. Evidence for depression or mood disorder  Subjective:   Review of Systems:   Constitutional: Denies fever, malaise, fatigue, headache or abrupt weight changes.  HEENT: Denies eye pain, eye redness, ear pain, ringing in the ears, wax buildup, runny nose, nasal congestion, bloody nose, or sore throat. Respiratory: Denies difficulty breathing, shortness of breath, cough or sputum production.   Cardiovascular: Denies chest pain, chest tightness, palpitations or swelling in the hands or feet.  Gastrointestinal: Denies abdominal pain, bloating, constipation, diarrhea or blood in the stool.  GU: Denies urgency, frequency,  pain with urination, burning sensation, blood in urine, odor or discharge. Musculoskeletal: Patient reports joint pain.  Denies decrease in range of motion, difficulty with gait, muscle pain or joint swelling.  Skin: Denies redness, rashes, lesions or ulcercations.  Neurological: Denies dizziness, difficulty with memory, difficulty with speech or problems with balance and  coordination.  Psych: Patient reports feeling down.  Denies anxiety, SI/HI.  No other specific complaints in a complete review of systems (except as listed in HPI above).  Objective:  PE:   BP 126/68 (BP Location: Right Arm, Patient Position: Sitting, Cuff Size: Large)   Pulse 87   Temp 97.8 F (36.6 C) (Temporal)   Resp 18   Ht 5' (1.524 m)   Wt 224 lb (101.6 kg)   SpO2 99%   BMI 43.75 kg/m   Wt Readings from Last 3 Encounters:  09/27/20 262 lb 3.2 oz (118.9 kg)  12/21/19 266 lb (120.7 kg)  04/30/19 244 lb 4.8 oz (110.8 kg)    General: Appears her stated age, obese, in NAD. Skin: Warm, dry and intact. No rashes or ulcerations noted. HEENT: Head: normal shape and size; Eyes: sclera white and EOMs intact;  Neck: Neck supple, trachea midline. No masses, lumps or thyromegaly present.  Cardiovascular: Normal rate and rhythm. S1,S2 noted.  No murmur, rubs or gallops noted. No JVD or BLE edema. No carotid bruits noted. Pulmonary/Chest: Normal effort and positive vesicular breath sounds. No respiratory distress. No wheezes, rales or ronchi noted.  Abdomen: Soft and nontender. Normal bowel sounds.  Musculoskeletal: Strength 5/5 BUE/BLE. No difficulty with gait. Neurological: Alert and oriented. Cranial nerves II-XII grossly intact. Coordination normal.  Psychiatric: Mood and affect mildly flat. Behavior is normal. Judgment and thought content normal.     BMET    Component Value Date/Time   NA 140 12/21/2019 1149   NA 143 01/07/2014 0000   K 4.4 12/21/2019 1149   CL 102 12/21/2019 1149   CO2 33 (H) 12/21/2019  1149   GLUCOSE 191 (H) 12/21/2019 1149   BUN 14 12/21/2019 1149   CREATININE 1.04 12/21/2019 1149   CALCIUM 9.4 12/21/2019 1149   GFRNONAA >60 04/30/2019 1203   GFRAA >60 04/30/2019 1203    Lipid Panel     Component Value Date/Time   CHOL 170 12/21/2019 1149   TRIG 195.0 (H) 12/21/2019 1149   HDL 53.10 12/21/2019 1149   CHOLHDL 3 12/21/2019 1149   VLDL 39.0 12/21/2019 1149   LDLCALC 78 12/21/2019 1149    CBC    Component Value Date/Time   WBC 8.2 12/21/2019 1149   RBC 4.14 12/21/2019 1149   HGB 12.2 12/21/2019 1149   HGB 14.5 08/16/2016 1200   HCT 37.1 12/21/2019 1149   HCT 42.8 08/16/2016 1200   PLT 336.0 12/21/2019 1149   PLT 297 08/16/2016 1200   MCV 89.6 12/21/2019 1149   MCV 86 08/16/2016 1200   MCH 28.5 04/30/2019 1203   MCHC 33.0 12/21/2019 1149   RDW 16.5 (H) 12/21/2019 1149   RDW 13.5 08/16/2016 1200   LYMPHSABS 2.1 04/30/2019 1203   MONOABS 0.6 04/30/2019 1203   EOSABS 0.2 04/30/2019 1203   BASOSABS 0.0 04/30/2019 1203    Hgb A1C Lab Results  Component Value Date   HGBA1C 7.9 (A) 09/27/2020      Assessment and Plan:   Medicare Annual Wellness Visit:  Diet: She does eat some meat. She consumes some fruits and veggies. She tries to avoid fried foods. She drinks mostly water. Physical activity:  None Depression/mood screen: Negative, PHQ 9 score of 0 Hearing: Intact to whispered voice Visual acuity: Grossly normal ADLs: Capable Fall risk: None Home safety: Good Cognitive evaluation: Intact to orientation, naming, recall and repetition EOL planning: No adv directives, full code/ I agree  Preventative Medicine: She declines flu, tetanus, Pneumovax,  Prevnar, Shingrix and COVID vaccines.  She no longer needs to screen for cervical cancer.  Mammogram ordered, she will call to schedule.  Bone density UTD.  Referral to GI placed for screening colonoscopy.  Encouraged her to consume a balanced diet and exercise regimen.  Advised her to see an eye  doctor and dentist annually.  Will check CBC, c-Met, lipid and A1c today.  Due dates for screening exam given to patient as part of her AVS.   Next appointment: 6 months, follow-up chronic conditions   Webb Silversmith, NP This visit occurred during the SARS-CoV-2 public health emergency.  Safety protocols were in place, including screening questions prior to the visit, additional usage of staff PPE, and extensive cleaning of exam room while observing appropriate contact time as indicated for disinfecting solutions.

## 2021-04-11 DIAGNOSIS — Z8542 Personal history of malignant neoplasm of other parts of uterus: Secondary | ICD-10-CM | POA: Insufficient documentation

## 2021-04-11 DIAGNOSIS — E66813 Obesity, class 3: Secondary | ICD-10-CM | POA: Insufficient documentation

## 2021-04-11 LAB — COMPLETE METABOLIC PANEL WITH GFR
AG Ratio: 1.9 (calc) (ref 1.0–2.5)
ALT: 18 U/L (ref 6–29)
AST: 18 U/L (ref 10–35)
Albumin: 4.1 g/dL (ref 3.6–5.1)
Alkaline phosphatase (APISO): 79 U/L (ref 37–153)
BUN: 14 mg/dL (ref 7–25)
CO2: 29 mmol/L (ref 20–32)
Calcium: 9.8 mg/dL (ref 8.6–10.4)
Chloride: 102 mmol/L (ref 98–110)
Creat: 0.93 mg/dL (ref 0.50–1.05)
Globulin: 2.2 g/dL (calc) (ref 1.9–3.7)
Glucose, Bld: 138 mg/dL — ABNORMAL HIGH (ref 65–99)
Potassium: 4.1 mmol/L (ref 3.5–5.3)
Sodium: 140 mmol/L (ref 135–146)
Total Bilirubin: 0.4 mg/dL (ref 0.2–1.2)
Total Protein: 6.3 g/dL (ref 6.1–8.1)
eGFR: 68 mL/min/{1.73_m2} (ref >=60)

## 2021-04-11 LAB — CBC
HCT: 40.7 % (ref 35.0–45.0)
Hemoglobin: 13.3 g/dL (ref 11.7–15.5)
MCH: 30.2 pg (ref 27.0–33.0)
MCHC: 32.7 g/dL (ref 32.0–36.0)
MCV: 92.3 fL (ref 80.0–100.0)
MPV: 8.9 fL (ref 7.5–12.5)
Platelets: 356 10*3/uL (ref 140–400)
RBC: 4.41 10*6/uL (ref 3.80–5.10)
RDW: 14.1 % (ref 11.0–15.0)
WBC: 7.9 10*3/uL (ref 3.8–10.8)

## 2021-04-11 LAB — HEMOGLOBIN A1C
Hgb A1c MFr Bld: 7.1 % of total Hgb — ABNORMAL HIGH (ref <5.7)
Mean Plasma Glucose: 157 mg/dL
eAG (mmol/L): 8.7 mmol/L

## 2021-04-11 LAB — LIPID PANEL
Cholesterol: 161 mg/dL (ref <200)
HDL: 55 mg/dL (ref >=50)
LDL Cholesterol (Calc): 74 mg/dL (calc)
Non-HDL Cholesterol (Calc): 106 mg/dL (calc) (ref <130)
Total CHOL/HDL Ratio: 2.9 (calc) (ref <5.0)
Triglycerides: 233 mg/dL — ABNORMAL HIGH (ref <150)

## 2021-04-11 NOTE — Assessment & Plan Note (Signed)
Encourage diet and exercise for weight loss 

## 2021-04-11 NOTE — Patient Instructions (Signed)
Health Maintenance for Postmenopausal Women ?Menopause is a normal process in which your ability to get pregnant comes to an end. This process happens slowly over many months or years, usually between the ages of 48 and 55. Menopause is complete when you have missed your menstrual period for 12 months. ?It is important to talk with your health care provider about some of the most common conditions that affect women after menopause (postmenopausal women). These include heart disease, cancer, and bone loss (osteoporosis). Adopting a healthy lifestyle and getting preventive care can help to promote your health and wellness. The actions you take can also lower your chances of developing some of these common conditions. ?What are the signs and symptoms of menopause? ?During menopause, you may have the following symptoms: ?Hot flashes. These can be moderate or severe. ?Night sweats. ?Decrease in sex drive. ?Mood swings. ?Headaches. ?Tiredness (fatigue). ?Irritability. ?Memory problems. ?Problems falling asleep or staying asleep. ?Talk with your health care provider about treatment options for your symptoms. ?Do I need hormone replacement therapy? ?Hormone replacement therapy is effective in treating symptoms that are caused by menopause, such as hot flashes and night sweats. ?Hormone replacement carries certain risks, especially as you become older. If you are thinking about using estrogen or estrogen with progestin, discuss the benefits and risks with your health care provider. ?How can I reduce my risk for heart disease and stroke? ?The risk of heart disease, heart attack, and stroke increases as you age. One of the causes may be a change in the body's hormones during menopause. This can affect how your body uses dietary fats, triglycerides, and cholesterol. Heart attack and stroke are medical emergencies. There are many things that you can do to help prevent heart disease and stroke. ?Watch your blood pressure ?High  blood pressure causes heart disease and increases the risk of stroke. This is more likely to develop in people who have high blood pressure readings or are overweight. ?Have your blood pressure checked: ?Every 3-5 years if you are 18-39 years of age. ?Every year if you are 40 years old or older. ?Eat a healthy diet ? ?Eat a diet that includes plenty of vegetables, fruits, low-fat dairy products, and lean protein. ?Do not eat a lot of foods that are high in solid fats, added sugars, or sodium. ?Get regular exercise ?Get regular exercise. This is one of the most important things you can do for your health. Most adults should: ?Try to exercise for at least 150 minutes each week. The exercise should increase your heart rate and make you sweat (moderate-intensity exercise). ?Try to do strengthening exercises at least twice each week. Do these in addition to the moderate-intensity exercise. ?Spend less time sitting. Even light physical activity can be beneficial. ?Other tips ?Work with your health care provider to achieve or maintain a healthy weight. ?Do not use any products that contain nicotine or tobacco. These products include cigarettes, chewing tobacco, and vaping devices, such as e-cigarettes. If you need help quitting, ask your health care provider. ?Know your numbers. Ask your health care provider to check your cholesterol and your blood sugar (glucose). Continue to have your blood tested as directed by your health care provider. ?Do I need screening for cancer? ?Depending on your health history and family history, you may need to have cancer screenings at different stages of your life. This may include screening for: ?Breast cancer. ?Cervical cancer. ?Lung cancer. ?Colorectal cancer. ?What is my risk for osteoporosis? ?After menopause, you may be   at increased risk for osteoporosis. Osteoporosis is a condition in which bone destruction happens more quickly than new bone creation. To help prevent osteoporosis or  the bone fractures that can happen because of osteoporosis, you may take the following actions: ?If you are 19-50 years old, get at least 1,000 mg of calcium and at least 600 international units (IU) of vitamin D per day. ?If you are older than age 50 but younger than age 70, get at least 1,200 mg of calcium and at least 600 international units (IU) of vitamin D per day. ?If you are older than age 70, get at least 1,200 mg of calcium and at least 800 international units (IU) of vitamin D per day. ?Smoking and drinking excessive alcohol increase the risk of osteoporosis. Eat foods that are rich in calcium and vitamin D, and do weight-bearing exercises several times each week as directed by your health care provider. ?How does menopause affect my mental health? ?Depression may occur at any age, but it is more common as you become older. Common symptoms of depression include: ?Feeling depressed. ?Changes in sleep patterns. ?Changes in appetite or eating patterns. ?Feeling an overall lack of motivation or enjoyment of activities that you previously enjoyed. ?Frequent crying spells. ?Talk with your health care provider if you think that you are experiencing any of these symptoms. ?General instructions ?See your health care provider for regular wellness exams and vaccines. This may include: ?Scheduling regular health, dental, and eye exams. ?Getting and maintaining your vaccines. These include: ?Influenza vaccine. Get this vaccine each year before the flu season begins. ?Pneumonia vaccine. ?Shingles vaccine. ?Tetanus, diphtheria, and pertussis (Tdap) booster vaccine. ?Your health care provider may also recommend other immunizations. ?Tell your health care provider if you have ever been abused or do not feel safe at home. ?Summary ?Menopause is a normal process in which your ability to get pregnant comes to an end. ?This condition causes hot flashes, night sweats, decreased interest in sex, mood swings, headaches, or lack  of sleep. ?Treatment for this condition may include hormone replacement therapy. ?Take actions to keep yourself healthy, including exercising regularly, eating a healthy diet, watching your weight, and checking your blood pressure and blood sugar levels. ?Get screened for cancer and depression. Make sure that you are up to date with all your vaccines. ?This information is not intended to replace advice given to you by your health care provider. Make sure you discuss any questions you have with your health care provider. ?Document Revised: 09/18/2020 Document Reviewed: 09/18/2020 ?Elsevier Patient Education ? 2022 Elsevier Inc. ? ?

## 2021-04-16 ENCOUNTER — Other Ambulatory Visit: Payer: Self-pay

## 2021-04-16 DIAGNOSIS — Z1211 Encounter for screening for malignant neoplasm of colon: Secondary | ICD-10-CM

## 2021-04-16 MED ORDER — NA SULFATE-K SULFATE-MG SULF 17.5-3.13-1.6 GM/177ML PO SOLN: 1.0000 | Freq: Once | ORAL | 0 refills | Status: AC

## 2021-04-16 NOTE — Progress Notes (Signed)
Blood thinner clearance °

## 2021-04-16 NOTE — Progress Notes (Signed)
Gastroenterology Pre-Procedure Review  Request Date: 05/15/21 Requesting Physician: Dr. Allen Norris  PATIENT REVIEW QUESTIONS: The patient responded to the following health history questions as indicated:    1. Are you having any GI issues? no 2. Do you have a personal history of Polyps? no 3. Do you have a family history of Colon Cancer or Polyps? yes (Mother- polyps) 4. 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 Joint replacements in the past 12 months?no 6. Major health problems in the past 3 months?no 7. Any artificial heart valves, MVP, or defibrillator?no    MEDICATIONS & ALLERGIES:    Patient reports the following regarding taking any anticoagulation/antiplatelet therapy:   Plavix, Coumadin, Eliquis, Xarelto, Lovenox, Pradaxa, Brilinta, or Effient? yes (Xarelto 20 mg) Aspirin? no  Patient confirms/reports the following medications:  Current Outpatient Medications  Medication Sig Dispense Refill   acetaminophen (TYLENOL) 650 MG CR tablet Take 1,300 mg by mouth daily.     Apremilast (OTEZLA) 30 MG TABS Take 1 tablet by mouth 2 (two) times daily.      betamethasone dipropionate (DIPROLENE) 0.05 % ointment Apply 1 application topically 2 (two) times daily as needed. psoriasis     cetirizine (ZYRTEC) 10 MG tablet Take 10 mg by mouth at bedtime.      Cholecalciferol 25 MCG (1000 UT) tablet Take 1,000 Units by mouth daily.     clotrimazole-betamethasone (LOTRISONE) cream APPLY TOPICALLY AS DIRECTED TWICE DAILY (Patient taking differently: Apply 1 application topically 2 (two) times daily as needed (BACTERIAL INFECTION). APPLY TOPICALLY AS DIRECTED TWICE DAILY) 60 g 5   diphenhydrAMINE (BENADRYL) 25 mg capsule Take 50 mg by mouth at bedtime.      doxycycline (ADOXA) 100 MG tablet Take 50 mg by mouth daily.     fluocinonide ointment (LIDEX) 6.30 % Apply 1 application topically 2 (two) times daily as needed (PSORIASIS BEHIND EARS).      folic acid (FOLVITE) 1 MG tablet Take 1 mg by mouth daily.      furosemide (LASIX) 40 MG tablet Take 1 tablet by mouth once daily 90 tablet 2   gabapentin (NEURONTIN) 300 MG capsule Take 2 capsules (600 mg total) by mouth 3 (three) times daily. 540 capsule 1   losartan (COZAAR) 50 MG tablet Take 1 tablet (50 mg total) by mouth daily. 90 tablet 1   metFORMIN (GLUCOPHAGE) 500 MG tablet Take 2 tabs PO in am and 1 tab PO in pm 270 tablet 0   methotrexate (RHEUMATREX) 2.5 MG tablet Take 25 mg by mouth every Friday. Caution:Chemotherapy. Protect from light.     nystatin ointment (MYCOSTATIN) Apply 1 application topically 2 (two) times daily.     Omega-3 Fatty Acids (FISH OIL PO) Take 1 capsule by mouth in the morning. 1040 mg of fish oil in each capsule     omeprazole (PRILOSEC) 40 MG capsule Take 1 capsule (40 mg total) by mouth daily. (Patient taking differently: Take 20 mg by mouth 2 (two) times daily.) 90 capsule 0   predniSONE (DELTASONE) 5 MG tablet Take 5 mg by mouth daily.     rivaroxaban (XARELTO) 20 MG TABS tablet Take 1 tablet (20 mg total) by mouth daily with supper. 90 tablet 1   simvastatin (ZOCOR) 10 MG tablet Take 1 tablet (10 mg total) by mouth daily. 90 tablet 1   SOOLANTRA 1 % CREA APPLY TO THE FACE EVERY DAY  6   vitamin C (ASCORBIC ACID) 500 MG tablet Take 1,000 mg by mouth daily.  Zinc 25 MG TABS Take by mouth.     No current facility-administered medications for this visit.    Patient confirms/reports the following allergies:  No Known Allergies  No orders of the defined types were placed in this encounter.   AUTHORIZATION INFORMATION Primary Insurance: 1D#: Group #:  Secondary Insurance: 1D#: Group #:  SCHEDULE INFORMATION: Date: 05/15/2021 Time: Location: [facility]

## 2021-04-23 ENCOUNTER — Ambulatory Visit: Payer: Medicaid Other | Admitting: Pharmacist

## 2021-04-23 ENCOUNTER — Telehealth: Payer: Self-pay | Admitting: Pharmacist

## 2021-04-23 DIAGNOSIS — E0843 Diabetes 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 autonomic (poly)neuropathy: Secondary | ICD-10-CM

## 2021-04-23 DIAGNOSIS — I82431 Acute embolism and thrombosis of right popliteal vein: Secondary | ICD-10-CM

## 2021-04-23 NOTE — Patient Instructions (Signed)
Thank you allowing the Care Management Team to be a part of your care! It was a pleasure speaking with you today!     Care Management Team    Noreene Larsson RN, MSN, CCM Nurse Care Coordinator  [phone]   Wallace Cullens, PharmD  Clinical Pharmacist  [phone]   Christa See, MSW, LCSW Clinical Social Worker [phone]   Visit Information   Goals Addressed             This Visit's Progress    Pharmacy Goals       Our goal A1c is less than 7%. This corresponds with fasting sugars less than 130 and 2 hour after meal sugars less than 180. Please keep a log when you check your blood sugar  Our goal bad cholesterol, or LDL, is less than 70 . This is why it is important to continue taking your simvastatin.  Please check your home blood pressure, keep a log of the results and bring this with you to your medical appointments.  Feel free to call me with any questions or concerns. I look forward to our next call!   Wallace Cullens, PharmD, [place], CPP Clinical Pharmacist [facility] [phone]        The patient verbalized understanding of instructions, educational materials, and care plan provided today and declined offer to receive copy of patient instructions, educational materials, and care plan.   Telephone follow up appointment with care management team member scheduled for: 06/27/2021 at 8:30 am

## 2021-04-23 NOTE — Chronic Care Management (AMB) (Signed)
Care Management   Pharmacy Note  04/23/2021 Name: Melissa Cabrera MRN: [id number] DOB: [birth year]  Subjective: Melissa Cabrera is a 65 y.o. year old female who is a primary care patient of [name], NP. The Care Management team was consulted for assistance with care management and care coordination needs.    Engaged with patient by telephone for follow up visit in response to provider referral for pharmacy case management and/or care coordination services.   The patient was given information about Care Management services today including:  Care Management services includes personalized support from designated clinical staff supervised by the patient's primary care provider, including individualized plan of care and coordination with other care providers. 24/7 contact phone numbers for assistance for urgent and routine care needs. The patient may stop case management services at any time by phone call to the office staff.  Patient agreed to services and consent obtained.  Assessment:  Review of patient status, including review of consultants reports, laboratory and other test data, was performed as part of comprehensive evaluation and provision of chronic care management services.   SDOH (Social Determinants of Health) assessments and interventions performed:    Objective:  Lab Results  Component Value Date   CREATININE 0.93 04/10/2021   CREATININE 1.04 12/21/2019   CREATININE 0.84 04/30/2019    Lab Results  Component Value Date   HGBA1C 7.1 (H) 04/10/2021       Component Value Date/Time   CHOL 161 04/10/2021 1008   TRIG 233 (H) 04/10/2021 1008   HDL 55 04/10/2021 1008   CHOLHDL 2.9 04/10/2021 1008   VLDL 39.0 12/21/2019 1149   LDLCALC 74 04/10/2021 1008   LDLDIRECT 142.0 05/16/2014 0844    BP Readings from Last 3 Encounters:  04/10/21 126/68  09/27/20 (!) 117/52  12/21/19 (!) 142/84    Care Plan  No Known Allergies  Medications Reviewed Today     Reviewed  by [name], NP (Nurse Practitioner) on 04/10/21 at [phone]  Med List Status: <None>   Medication Order Taking? Sig Documenting Provider Last Dose Status Informant  acetaminophen (TYLENOL) 650 MG CR tablet [id number] Yes Take 1,300 mg by mouth daily. [provider] Taking Active   Apremilast (OTEZLA) 30 MG TABS [id number] Yes Take 1 tablet by mouth 2 (two) times daily.  Rosita Kea, PA-C Taking Active   betamethasone dipropionate (DIPROLENE) 0.05 % ointment [id number] Yes Apply 1 application topically 2 (two) times daily as needed. psoriasis [provider] Taking Active Self  cetirizine (ZYRTEC) 10 MG tablet [id number] Yes Take 10 mg by mouth at bedtime.  [provider] Taking Active Self  Cholecalciferol 25 MCG (1000 UT) tablet [id number] Yes Take 1,000 Units by mouth daily. [provider] Taking Active   clotrimazole-betamethasone (LOTRISONE) cream [id number] Yes APPLY TOPICALLY AS DIRECTED TWICE DAILY  Patient taking differently: Apply 1 application topically 2 (two) times daily as needed (BACTERIAL INFECTION). APPLY TOPICALLY AS DIRECTED TWICE DAILY   Baity, Coralie Keens, NP Taking Active Self  diphenhydrAMINE (BENADRYL) 25 mg capsule [id number] Yes Take 50 mg by mouth at bedtime.  [provider] Taking Active Self  doxycycline (ADOXA) 100 MG tablet [id number] Yes Take 50 mg by mouth daily. [provider] Taking Active   fluocinonide ointment (LIDEX) 0.05 % [id number] Yes Apply 1 application topically 2 (two) times daily as needed (PSORIASIS BEHIND EARS).  [provider] Taking Active Self  folic acid (FOLVITE) 1 MG tablet [id number] Yes Take 1 mg by mouth  daily. [provider] Taking Active Self           Med Note Winfield Cunas, Elbia Paro A   Fri Oct 06, 2020  9:32 AM) Reports takes 3 tablets daily on Friday & Saturday and 1 tablet daily Sunday-Thursday as directed by Rheumatology  furosemide (LASIX) 40 MG tablet [id number] Yes  Take 1 tablet by mouth once daily [name], NP Taking Active   gabapentin (NEURONTIN) 300 MG capsule [id number] Yes Take 2 capsules (600 mg total) by mouth 3 (three) times daily. [name], NP Taking Active   losartan (COZAAR) 50 MG tablet [id number] Yes Take 1 tablet (50 mg total) by mouth daily. [name], NP Taking Active   metFORMIN (GLUCOPHAGE) 500 MG tablet [id number] Yes Take 2 tabs PO in am and 1 tab PO in pm [name], NP Taking Active   methotrexate (RHEUMATREX) 2.5 MG tablet [id number] Yes Take 25 mg by mouth every Friday. Caution:Chemotherapy. Protect from light. [provider] Taking Active Self           Med Note Channing Mutters, JENNIFER   Thu Nov 21, 2016 11:23 AM) PT AWARE TO STOP PRIOR TO PROCEDURE   nystatin ointment (MYCOSTATIN) [id number] Yes Apply 1 application topically 2 (two) times daily. [provider] Taking Active   Omega-3 Fatty Acids (FISH OIL PO) [id number] Yes Take 1 capsule by mouth in the morning. 1040 mg of fish oil in each capsule [provider] Taking Active Self  omeprazole (PRILOSEC) 40 MG capsule [id number] Yes Take 1 capsule (40 mg total) by mouth daily.  Patient taking differently: Take 20 mg by mouth 2 (two) times daily.   [name], NP Taking Active   predniSONE (DELTASONE) 5 MG tablet [id number] Yes Take 5 mg by mouth daily. [provider] Taking Active   rivaroxaban (XARELTO) 20 MG TABS tablet [id number] Yes Take 1 tablet (20 mg total) by mouth daily with supper. [name], NP Taking Active   simvastatin (ZOCOR) 10 MG tablet [id number] Yes Take 1 tablet (10 mg total) by mouth daily. [name], NP Taking Active   SOOLANTRA 1 % CREA [id number] Yes APPLY TO THE [facility] EVERY DAY [provider] Taking Active   vitamin C (ASCORBIC ACID) 500 MG tablet [id number] Yes Take 1,000 mg by mouth daily.  [provider] Taking Active   Zinc 25 MG TABS [id number] Yes Take by mouth. [provider] Taking Active             Patient Active Problem List   Diagnosis Date Noted   History of endometrial cancer 04/11/2021   Morbid obesity ([place]) 04/11/2021   Chronic diastolic congestive heart failure ([place]) 12/21/2019   Diabetes 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 ([place]) 12/21/2019   History of DVT (deep vein thrombosis) 12/21/2019   Rosacea 03/24/2018   Psoriatic arthritis ([place]) 02/04/2017   Gastroesophageal reflux disease without esophagitis 04/12/2014   Essential hypertension 04/12/2014   HLD (hyperlipidemia) 04/12/2014    Care Plan : PharmD - Medication Management/Assistance  Updates made by Rennis Petty, RPH-CPP since 04/23/2021 12:00 AM     Problem: Disease Progression      Long-Range Goal: Disease Progression Prevented or Minimized   Start Date: 09/29/2020  Expected End Date: 12/28/2020  This Visit's Progress: On track  Recent Progress: On track  Priority: High  Note:   Current Barriers:  Unable to independently afford treatment regimen Patient currently without prescription  coverage Patient APPROVED for re-enrolled in [place] Patient Assistance program for Xarelto through 10/10/2021 Fear of injections  Pharmacist Clinical Goal(s):  Over the next 90 days, patient will verbalize ability to afford treatment regimen Over the next 90 days, patient will achieve control of blood sugar control as evidenced by A1C through collaboration with PharmD and provider.   Interventions: 1:1 collaboration with [name], NP regarding development and update of comprehensive plan of care as evidenced by provider attestation and co-signature Inter-disciplinary care team collaboration (see longitudinal plan of care) Perform chart review. Patient seen for Office Visit with PCP on 11/29 for annual wellness visit A1C improved to 7.1% Reports her husband helps manage her medications and fills weekly pillbox for  patient.  Medication Assistance: Reports now has Medicare coverage, as of 04/12/2021, including [employer] Silver [employer] Plus Part D coverage Review health plan Part D benefit information per [facility] website with patient. Based on formulary, Xarelto is a tier 3 option for patient Note patient previously seen by [facility], last on 04/30/2019 regarding history of DVT.  Previously reported unable to return for follow up with Dr. Tasia Catchings due to financial concerns Reports with Medicare coverage, now able to schedule follow up with provider for further evaluation/discussion regarding medication management and will call to schedule an appointment Patient to follow up with office if in need of new referral from PCP Review with patient criteria for [place] Patient Assistance program for Xarelto for patients with Medicare, including out of pocket expense requirement  T2DM: Control improved based on latest A1C; current treatment: Metformin 500 mg - 2 tablets (1000 mg) with breakfast and 1 tablet (500 mg) with supper  Note patient on prednisone 5 mg daily from Rheumatology Reports recent morning fasting blood sugar readings ranging: 128-148 Reports continues significant changes to her diet and using her blood sugar readings as feedback to help determine impact of dietary choices on blood sugar Stopped processed foods, limiting carbohydrates and having balanced meals Discuss factors impacting blood sugar, including diet, exercise and medications Reports weight loss of ~40 lbs since May.  Exercise: Patient considering getting back to going to the gym [employer]). Plans to ask daugher Tia to go with her. Also discuss walking around home Patient considering obtaining elliptical machine to use at home Eye exam: Planned to schedule once has Medicare coverage   Hyperlipidemia: Current treatment: Simvastatin 10 mg QHS Omega-3 supplement daily Discuss impact of exercise on  lipid-control  Patient Goals/Self-Care Activities Over the next 90 days, patient will:  - collaborate with provider on medication access solutions  Follow Up Plan: Telephone follow up appointment with care management team member scheduled for: 06/27/2021 at 8:30 am    Wallace Cullens, PharmD, [place], [place] [phone]

## 2021-04-23 NOTE — Telephone Encounter (Signed)
Patient requesting renewal of Xarelto Rx. Would you please send this to patient's [place]?  Thank you!   Wallace Cullens, PharmD, [place] [phone]

## 2021-04-25 MED ORDER — RIVAROXABAN 20 MG PO TABS: 20.0000 mg | ORAL_TABLET | Freq: Every day | ORAL | 1 refills | Status: DC

## 2021-05-01 ENCOUNTER — Telehealth: Payer: Self-pay

## 2021-05-01 NOTE — Telephone Encounter (Signed)
Copied from [place] [id number]. Topic: Complaint - Billing/Coding >> May 01, 2021  1:41 PM Wynetta Emery, [place] C wrote: Pt called in for assistance. Pt says that she had a visit on 11/29. Pt says that she received a bill from her insurance stating that this visit need to be coded as a preventative visit instead of wellness. Pt says that medicare doesn't cover within the first year.   Please assist pt further.    Route to Engineer, building services.

## 2021-05-01 NOTE — Telephone Encounter (Signed)
Melissa Pina, Do I need to change this to a Medicare Preventative visit?

## 2021-05-02 ENCOUNTER — Telehealth: Payer: Self-pay

## 2021-05-02 NOTE — Telephone Encounter (Signed)
Copied from [place] [phone]. Topic: Complaint - Billing/Coding >> May 01, 2021  1:41 PM Wynetta Emery, [place] C wrote: Pt called in for assistance. Pt says that she had a visit on 11/29. Pt says that she received a bill from her insurance stating that this visit need to be coded as a preventative visit instead of wellness. Pt says that medicare doesn't cover within the first year.   Please assist pt further.    Route to Engineer, building services.

## 2021-05-03 NOTE — Addendum Note (Signed)
Addended by: [name] on: 05/03/2021 07:36 AM   Modules accepted: Level of Service

## 2021-05-03 NOTE — Telephone Encounter (Signed)
The coding and billing has been addended to reflect a Welcome to [employer]

## 2021-05-03 NOTE — Telephone Encounter (Signed)
Done

## 2021-05-10 ENCOUNTER — Telehealth: Payer: Self-pay

## 2021-05-10 NOTE — Telephone Encounter (Signed)
Informed patient of blood thinner clearance. She is to hold for 2 days prior to procedure and restart 1 day after. Pt verbalized understanding. Clearance will be scanned into chart.

## 2021-05-15 ENCOUNTER — Ambulatory Visit: Payer: Medicare Other | Admitting: Anesthesiology

## 2021-05-15 ENCOUNTER — Encounter
Admission: RE | Disposition: A | Discharge status: Home / Self Care | Payer: Self-pay | Source: Home / Self Care | Attending: Gastroenterology

## 2021-05-15 ENCOUNTER — Encounter: Payer: Self-pay | Admitting: Gastroenterology

## 2021-05-15 ENCOUNTER — Ambulatory Visit
Admission: RE | Admit: 2021-05-15 | Discharge: 2021-05-15 | Disposition: A | Discharge status: Home / Self Care | Payer: Medicare Other | Attending: Gastroenterology | Admitting: Gastroenterology

## 2021-05-15 DIAGNOSIS — Z1211 Encounter for screening for malignant neoplasm of colon: Secondary | ICD-10-CM | POA: Diagnosis present

## 2021-05-15 DIAGNOSIS — D124 Benign neoplasm of descending colon: Secondary | ICD-10-CM | POA: Diagnosis not present

## 2021-05-15 DIAGNOSIS — E119 Type 2 diabetes mellitus without complications: Secondary | ICD-10-CM | POA: Diagnosis not present

## 2021-05-15 DIAGNOSIS — K640 First degree hemorrhoids: Secondary | ICD-10-CM | POA: Insufficient documentation

## 2021-05-15 DIAGNOSIS — D122 Benign neoplasm of ascending colon: Secondary | ICD-10-CM | POA: Diagnosis not present

## 2021-05-15 DIAGNOSIS — K635 Polyp of colon: Secondary | ICD-10-CM

## 2021-05-15 DIAGNOSIS — E785 Hyperlipidemia, unspecified: Secondary | ICD-10-CM | POA: Insufficient documentation

## 2021-05-15 DIAGNOSIS — K219 Gastro-esophageal reflux disease without esophagitis: Secondary | ICD-10-CM | POA: Insufficient documentation

## 2021-05-15 DIAGNOSIS — I10 Essential (primary) hypertension: Secondary | ICD-10-CM | POA: Insufficient documentation

## 2021-05-15 HISTORY — PX: COLONOSCOPY WITH PROPOFOL: SHX5780

## 2021-05-15 LAB — GLUCOSE, CAPILLARY: Glucose-Capillary: 143 mg/dL — ABNORMAL HIGH (ref 70–99)

## 2021-05-15 SURGERY — COLONOSCOPY WITH PROPOFOL
Anesthesia: General

## 2021-05-15 MED ORDER — PROPOFOL 10 MG/ML IV BOLUS
INTRAVENOUS | Status: DC | PRN
  Administered 2021-05-15: 70 mg via INTRAVENOUS

## 2021-05-15 MED ORDER — SODIUM CHLORIDE 0.9 % IV SOLN: INTRAVENOUS | Status: DC

## 2021-05-15 MED ORDER — PROPOFOL 500 MG/50ML IV EMUL
INTRAVENOUS | Status: DC | PRN
  Administered 2021-05-15: 140 ug/kg/min via INTRAVENOUS

## 2021-05-15 NOTE — H&P (Signed)
Lucilla Lame, MD [address], [place] [address] Phone: [phone] Fax : [phone]  Primary Care Physician:  [name], NP Primary Gastroenterologist:  Dr. Allen Norris  Pre-Procedure History & Physical: HPI:  Melissa Cabrera is a 66 y.o. female is here for a screening colonoscopy.   Past Medical History:  Diagnosis Date   Allergy    Arthritis    Cancer ([place])    endometrial   Chicken pox    Diabetes mellitus without complication (HCC)    GERD (gastroesophageal reflux disease)    Heart murmur    DUE TO RHEUMATIC FEVER PER PT   Hyperlipidemia    Hypertension    Psoriasis    Rheumatic fever    Rosacea     Past Surgical History:  Procedure Laterality Date   ABDOMINAL HYSTERECTOMY     ANTERIOR AND POSTERIOR REPAIR N/A 12/04/2016   Procedure: ANTERIOR (CYSTOCELE) AND POSTERIOR REPAIR (RECTOCELE), TRANSOBTURATOR SLING PLACEMENT(ARIS);  Surgeon: Harlin Heys, MD;  Location: [facility] ORS;  Service: Gynecology;  Laterality: N/A;   DILATION AND CURETTAGE OF UTERUS N/A 09/16/2016   Procedure: DILATATION AND CURETTAGE;  Surgeon: Harlin Heys, MD;  Location: [facility] OR[facility];  Service: Gynecology;  Laterality: N/A;   LAPAROSCOPIC HYSTERECTOMY Bilateral 12/04/2016   Procedure: HYSTERECTOMY TOTAL LAPAROSCOPIC WITH BILATERAL SALPINGO OOPHERECTOMY;  Surgeon: Harlin Heys, MD;  Location: [facility] ORS;  Service: Gynecology;  Laterality: Bilateral;   MOUTH SURGERY      Prior to Admission medications   Medication Sig Start Date End Date Taking? Authorizing Provider  doxycycline (ADOXA) 100 MG tablet Take 50 mg by mouth daily. 11/28/17  Yes [provider]  furosemide (LASIX) 40 MG tablet Take 1 tablet by mouth once daily 03/06/21  Yes Baity, Coralie Keens, NP  losartan (COZAAR) 50 MG tablet Take 1 tablet (50 mg total) by mouth daily. 09/27/20  Yes [name], NP  metFORMIN (GLUCOPHAGE) 500 MG tablet Take 2 tabs PO in am and 1 tab PO in pm 02/28/21  Yes Baity,  Coralie Keens, NP  omeprazole (PRILOSEC) 40 MG capsule Take 1 capsule (40 mg total) by mouth daily. Patient taking differently: Take 20 mg by mouth 2 (two) times daily. 11/20/18  Yes Baity, Coralie Keens, NP  predniSONE (DELTASONE) 5 MG tablet Take 5 mg by mouth daily. 09/25/20  Yes [provider]  simvastatin (ZOCOR) 10 MG tablet Take 1 tablet (10 mg total) by mouth daily. 09/27/20  Yes [name], NP  vitamin C (ASCORBIC ACID) 500 MG tablet Take 1,000 mg by mouth daily.    Yes [provider]  Zinc 25 MG TABS Take by mouth.   Yes [provider]  acetaminophen (TYLENOL) 650 MG CR tablet Take 1,300 mg by mouth daily.    [provider]  Apremilast (OTEZLA) 30 MG TABS Take 1 tablet by mouth 2 (two) times daily.  03/08/19   Rosita Kea, PA-C  betamethasone dipropionate (DIPROLENE) 0.05 % ointment Apply 1 application topically 2 (two) times daily as needed. psoriasis    [provider]  cetirizine (ZYRTEC) 10 MG tablet Take 10 mg by mouth at bedtime.     [provider]  Cholecalciferol 25 MCG (1000 UT) tablet Take 1,000 Units by mouth daily.    [provider]  clotrimazole-betamethasone (LOTRISONE) cream APPLY TOPICALLY AS DIRECTED TWICE DAILY Patient taking differently: Apply 1 application topically 2 (two) times daily as needed (BACTERIAL INFECTION). APPLY TOPICALLY AS DIRECTED TWICE DAILY  10/29/16   [name], NP  diphenhydrAMINE (BENADRYL) 25 mg capsule Take 50 mg by mouth at bedtime.     [provider]  fluocinonide ointment (LIDEX) 8.56 % Apply 1 application topically 2 (two) times daily as needed (PSORIASIS BEHIND EARS).     [provider]  folic acid (FOLVITE) 1 MG tablet Take 1 mg by mouth daily.    [provider]  gabapentin (NEURONTIN) 300 MG capsule Take 2 capsules (600 mg total) by mouth 3 (three) times daily. 09/27/20   [name], NP  methotrexate (RHEUMATREX) 2.5 MG tablet Take 25 mg by  mouth every Friday. Caution:Chemotherapy. Protect from light.    [provider]  nystatin ointment (MYCOSTATIN) Apply 1 application topically 2 (two) times daily.    [provider]  Omega-3 Fatty Acids (FISH OIL PO) Take 1 capsule by mouth in the morning. 1040 mg of fish oil in each capsule    [provider]  rivaroxaban (XARELTO) 20 MG TABS tablet Take 1 tablet (20 mg total) by mouth daily with supper. 04/25/21   [name], NP  SOOLANTRA 1 % CREA APPLY TO THE FACE EVERY DAY 01/06/17   [provider]    Allergies as of 04/17/2021   (No Known Allergies)    Family History  Problem Relation Age of Onset   Cancer Mother        skin   Dementia Mother    Cancer Sister        skin and liver   Stroke Maternal Grandmother    Skin cancer Brother    Diabetes Neg Hx    Heart disease Neg Hx    Breast cancer Neg Hx     Social History   Socioeconomic History   Marital status: Married    Spouse name: Not on file   Number of children: Not on file   Years of education: Not on file   Highest education level: Not on file  Occupational History   Not on file  Tobacco Use   Smoking status: Former    Packs/day: 4.00    Years: 16.00    Pack years: 64.00    Types: Cigarettes    Quit date: 09/12/1995    Years since quitting: 25.6   Smokeless tobacco: Never   Tobacco comments:    quit in 1997  Vaping Use   Vaping Use: Never used  Substance and Sexual Activity   Alcohol use: No    Alcohol/week: 0.0 standard drinks   Drug use: No   Sexual activity: Yes    Birth control/protection: None  Other Topics Concern   Not on file  Social History Narrative   Not on file   Social Determinants of Health   Financial Resource Strain: Not on file  Food Insecurity: Not on file  Transportation Needs: Not on file  Physical Activity: Not on file  Stress: Not on file  Social Connections: Not on file  Intimate Partner Violence: Not on file    Review of  Systems: See HPI, otherwise negative ROS  Physical Exam: Pulse 83    Temp (!) 96.3 F (35.7 C) (Temporal)    Resp 16    Ht 5' (1.524 m)    Wt 99.3 kg    SpO2 98%    BMI 42.77 kg/m  General:   Alert,  pleasant and cooperative in NAD Head:  Normocephalic and atraumatic. Neck:  Supple; no masses or thyromegaly. Lungs:  Clear throughout  to auscultation.    Heart:  Regular rate and rhythm. Abdomen:  Soft, nontender and nondistended. Normal bowel sounds, without guarding, and without rebound.   Neurologic:  Alert and  oriented x4;  grossly normal neurologically.  Impression/Plan: Melissa Cabrera is now here to undergo a screening colonoscopy.  Risks, benefits, and alternatives regarding colonoscopy have been reviewed with the patient.  Questions have been answered.  All parties agreeable.

## 2021-05-15 NOTE — Anesthesia Postprocedure Evaluation (Signed)
Anesthesia Post Note  Patient: Melissa Cabrera  Procedure(s) Performed: COLONOSCOPY WITH PROPOFOL  Patient location during evaluation: Endoscopy Anesthesia Type: General Level of consciousness: awake and alert Pain management: pain level controlled Vital Signs Assessment: post-procedure vital signs reviewed and stable Respiratory status: spontaneous breathing, nonlabored ventilation, respiratory function stable and patient connected to nasal cannula oxygen Cardiovascular status: blood pressure returned to baseline and stable Postop Assessment: no apparent nausea or vomiting Anesthetic complications: no   No notable events documented.   Last Vitals:  Vitals:   05/15/21 0955 05/15/21 1015  BP: 121/64 106/74  Pulse:    Resp:    Temp: (!) 35.8 C   SpO2:      Last Pain:  Vitals:   05/15/21 1015  TempSrc:   PainSc: 0-No pain                 Precious Haws Jovane Foutz

## 2021-05-15 NOTE — Op Note (Signed)
[facility] Gastroenterology Patient Name: Melissa Cabrera Procedure Date: 05/15/2021 9:22 AM MRN: [id number] Account #: [id number] Date of Birth: [birth year] Admit Type: Outpatient Age: 66 Room: [facility] ENDO ROOM 4 Gender: Female Note Status: Finalized Instrument Name: Jasper Riling [id number] Procedure:             Colonoscopy Indications:           Screening for colorectal malignant neoplasm Providers:             Lucilla Lame MD, MD Referring MD:          [name] (Referring MD) Medicines:             Propofol per Anesthesia Complications:         No immediate complications. Procedure:             Pre-Anesthesia Assessment:                        - Prior to the procedure, a History and Physical was                         performed, and patient medications and allergies were                         reviewed. The patient's tolerance of previous                         anesthesia was also reviewed. The risks and benefits                         of the procedure and the sedation options and risks                         were discussed with the patient. All questions were                         answered, and informed consent was obtained. Prior                         Anticoagulants: The patient has taken no previous                         anticoagulant or antiplatelet agents. ASA Grade                         Assessment: II - A patient with mild systemic disease.                         After reviewing the risks and benefits, the patient                         was deemed in satisfactory condition to undergo the                         procedure.                        After obtaining informed consent, the colonoscope was  passed under direct vision. Throughout the procedure,                         the patient's blood pressure, pulse, and oxygen                         saturations were monitored continuously. The                          Colonoscope was introduced through the anus and                         advanced to the the cecum, identified by appendiceal                         orifice and ileocecal valve. The colonoscopy was                         performed without difficulty. The patient tolerated                         the procedure well. The quality of the bowel                         preparation was excellent. Findings:      The perianal and digital rectal examinations were normal.      A 3 mm polyp was found in the ascending colon. The polyp was sessile.       The polyp was removed with a cold biopsy forceps. Resection and       retrieval were complete.      A 3 mm polyp was found in the descending colon. The polyp was sessile.       The polyp was removed with a cold biopsy forceps. Resection and       retrieval were complete.      Non-bleeding internal hemorrhoids were found during retroflexion. The       hemorrhoids were Grade I (internal hemorrhoids that do not prolapse). Impression:            - One 3 mm polyp in the ascending colon, removed with                         a cold biopsy forceps. Resected and retrieved.                        - One 3 mm polyp in the descending colon, removed with                         a cold biopsy forceps. Resected and retrieved.                        - Non-bleeding internal hemorrhoids. Recommendation:        - Discharge patient to home.                        - Resume previous diet.                        - Continue present medications.                        -  Await pathology results.                        - If the pathology report reveals adenomatous tissue,                         then repeat the colonoscopy for surveillance in 7                         years. Procedure Code(s):     --- Professional ---                        [phone], Colonoscopy, flexible; with biopsy, single or                         multiple Diagnosis Code(s):     --- Professional ---                         Z12.11, Encounter for screening for malignant neoplasm                         of colon                        K63.5, Polyp of colon CPT copyright 2019 [employer]. All rights reserved. The codes documented in this report are preliminary and upon coder review may  be revised to meet current compliance requirements. Lucilla Lame MD, MD 05/15/2021 9:51:41 AM This report has been signed electronically. Number of Addenda: 0 Note Initiated On: 05/15/2021 9:22 AM Scope Withdrawal Time: 0 hours 8 minutes 54 seconds  Total Procedure Duration: 0 hours 13 minutes 30 seconds  Estimated Blood Loss:  Estimated blood loss: none.      [facility]

## 2021-05-15 NOTE — Transfer of Care (Signed)
Immediate Anesthesia Transfer of Care Note  Patient: Melissa Cabrera  Procedure(s) Performed: COLONOSCOPY WITH PROPOFOL  Patient Location: PACU  Anesthesia Type:General  Level of Consciousness: awake, alert  and oriented  Airway & Oxygen Therapy: Patient Spontanous Breathing  Post-op Assessment: Report given to RN and Post -op Vital signs reviewed and stable  Post vital signs: Reviewed and stable  Last Vitals:  Vitals Value Taken Time  BP    Temp    Pulse    Resp    SpO2      Last Pain:  Vitals:   05/15/21 0903  TempSrc: Temporal         Complications: No notable events documented.

## 2021-05-15 NOTE — Anesthesia Preprocedure Evaluation (Signed)
Anesthesia Evaluation  Patient identified by MRN, date of birth, ID band Patient awake    Reviewed: Allergy & Precautions, NPO status , Patient's Chart, lab work & pertinent test results  History of Anesthesia Complications Negative for: history of anesthetic complications  Airway Mallampati: III  TM Distance: >3 FB Neck ROM: full    Dental  (+) Chipped, Poor Dentition, Missing   Pulmonary neg shortness of breath, former smoker,    Pulmonary exam normal        Cardiovascular hypertension, (-) angina+CHF  Normal cardiovascular exam     Neuro/Psych negative neurological ROS  negative psych ROS   GI/Hepatic Neg liver ROS, GERD  Medicated and Controlled,  Endo/Other  diabetes, Type 2  Renal/GU negative Renal ROS  negative genitourinary   Musculoskeletal  (+) Arthritis ,   Abdominal   Peds  Hematology negative hematology ROS (+)   Anesthesia Other Findings Past Medical History: No date: Allergy No date: Arthritis No date: Cancer [facility])     Comment:  endometrial No date: Chicken pox No date: Diabetes mellitus without complication (HCC) No date: GERD (gastroesophageal reflux disease) No date: Heart murmur     Comment:  DUE TO RHEUMATIC FEVER PER PT No date: Hyperlipidemia No date: Hypertension No date: Psoriasis No date: Rheumatic fever No date: Rosacea  Past Surgical History: No date: ABDOMINAL HYSTERECTOMY 12/04/2016: ANTERIOR AND POSTERIOR REPAIR; N/A     Comment:  Procedure: ANTERIOR (CYSTOCELE) AND POSTERIOR REPAIR               (RECTOCELE), TRANSOBTURATOR SLING PLACEMENT(ARIS);                Surgeon: Harlin Heys, MD;  Location: [facility] ORS;                Service: Gynecology;  Laterality: N/A; 09/16/2016: DILATION AND CURETTAGE OF UTERUS; N/A     Comment:  Procedure: DILATATION AND CURETTAGE;  Surgeon: Harlin Heys, MD;  Location: [facility] ORS;  Service:               Gynecology;   Laterality: N/A; 12/04/2016: LAPAROSCOPIC HYSTERECTOMY; Bilateral     Comment:  Procedure: HYSTERECTOMY TOTAL LAPAROSCOPIC WITH               BILATERAL SALPINGO OOPHERECTOMY;  Surgeon: Harlin Heys, MD;  Location: [facility] ORS;  Service: Gynecology;                Laterality: Bilateral; No date: MOUTH SURGERY  BMI    Body Mass Index: 42.77 kg/m      Reproductive/Obstetrics negative OB ROS                             Anesthesia Physical Anesthesia Plan  ASA: 3  Anesthesia Plan: General   Post-op Pain Management:    Induction: Intravenous  PONV Risk Score and Plan: Propofol infusion and TIVA  Airway Management Planned: Natural Airway and Nasal Cannula  Additional Equipment:   Intra-op Plan:   Post-operative Plan:   Informed Consent: I have reviewed the patients History and Physical, chart, labs and discussed the procedure including the risks, benefits and alternatives for the proposed anesthesia with the patient or authorized representative who has indicated his/her understanding and acceptance.     Dental Advisory  Given  Plan Discussed with: Anesthesiologist, CRNA and Surgeon  Anesthesia Plan Comments: (Patient consented for risks of anesthesia including but not limited to:  - adverse reactions to medications - risk of airway placement if required - damage to eyes, teeth, lips or other oral mucosa - nerve damage due to positioning  - sore throat or hoarseness - Damage to heart, brain, nerves, lungs, other parts of body or loss of life  Patient voiced understanding.)        Anesthesia Quick Evaluation

## 2021-05-16 ENCOUNTER — Encounter: Payer: Self-pay | Admitting: Gastroenterology

## 2021-05-16 LAB — SURGICAL PATHOLOGY

## 2021-05-22 ENCOUNTER — Encounter: Payer: Self-pay | Admitting: Internal Medicine

## 2021-05-22 DIAGNOSIS — I82431 Acute embolism and thrombosis of right popliteal vein: Secondary | ICD-10-CM

## 2021-05-22 MED ORDER — GABAPENTIN 300 MG PO CAPS: 600.0000 mg | ORAL_CAPSULE | Freq: Three times a day (TID) | ORAL | 1 refills | Status: DC

## 2021-05-22 MED ORDER — OMEPRAZOLE 40 MG PO CPDR: 40.0000 mg | DELAYED_RELEASE_CAPSULE | Freq: Every day | ORAL | 0 refills | Status: DC

## 2021-05-22 MED ORDER — LOSARTAN POTASSIUM 50 MG PO TABS: 50.0000 mg | ORAL_TABLET | Freq: Every day | ORAL | 1 refills | Status: DC

## 2021-05-22 MED ORDER — METFORMIN HCL 500 MG PO TABS: ORAL_TABLET | ORAL | 0 refills | Status: DC

## 2021-05-22 MED ORDER — RIVAROXABAN 20 MG PO TABS: 20.0000 mg | ORAL_TABLET | Freq: Every day | ORAL | 1 refills | Status: DC

## 2021-05-22 MED ORDER — SIMVASTATIN 10 MG PO TABS: 10.0000 mg | ORAL_TABLET | Freq: Every day | ORAL | 1 refills | Status: DC

## 2021-05-22 MED ORDER — FUROSEMIDE 40 MG PO TABS: 40.0000 mg | ORAL_TABLET | Freq: Every day | ORAL | 2 refills | Status: DC

## 2021-05-24 MED ORDER — LOSARTAN POTASSIUM 50 MG PO TABS: 50.0000 mg | ORAL_TABLET | Freq: Every day | ORAL | 1 refills | Status: DC

## 2021-05-24 NOTE — Addendum Note (Signed)
Addended by: Wilson Singer on: 05/24/2021 04:11 PM   Modules accepted: Orders

## 2021-06-07 ENCOUNTER — Other Ambulatory Visit: Payer: Self-pay

## 2021-06-07 ENCOUNTER — Encounter: Payer: Self-pay | Admitting: Internal Medicine

## 2021-06-08 MED ORDER — LOSARTAN POTASSIUM 50 MG PO TABS: 50.0000 mg | ORAL_TABLET | Freq: Every day | ORAL | 1 refills | Status: DC

## 2021-06-27 ENCOUNTER — Ambulatory Visit (INDEPENDENT_AMBULATORY_CARE_PROVIDER_SITE_OTHER): Payer: Medicare Other | Admitting: Pharmacist

## 2021-06-27 DIAGNOSIS — E0843 Diabetes 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 autonomic (poly)neuropathy: Secondary | ICD-10-CM

## 2021-06-27 DIAGNOSIS — E7800 Pure hypercholesterolemia, unspecified: Secondary | ICD-10-CM

## 2021-06-27 NOTE — Chronic Care Management (AMB) (Signed)
Chronic Care Management CCM Pharmacy Note  06/27/2021 Name:  Melissa Cabrera MRN:  [id number] DOB:  Nov 01, 1955   Subjective: Melissa Cabrera is an 66 y.o. year old female who is a primary patient of [name], NP.  The CCM team was consulted for assistance with disease management and care coordination needs.    Engaged with patient by telephone for follow up visit for pharmacy case management and/or care coordination services.   Objective:  Medications Reviewed Today     Reviewed by Frederich Balding, RN (Registered Nurse) on 05/15/21 at [phone]  Med List Status: <None>   Medication Order Taking? Sig Documenting Provider Last Dose Status Informant  acetaminophen (TYLENOL) 650 MG CR tablet [id number]  Take 1,300 mg by mouth daily. [provider]  Active   Apremilast (OTEZLA) 30 MG TABS [id number]  Take 1 tablet by mouth 2 (two) times daily.  Rosita Kea, PA-C  Active   betamethasone dipropionate (DIPROLENE) 0.05 % ointment [id number]  Apply 1 application topically 2 (two) times daily as needed. psoriasis [provider]  Active Self  cetirizine (ZYRTEC) 10 MG tablet [id number]  Take 10 mg by mouth at bedtime.  [provider]  Active Self  Cholecalciferol 25 MCG (1000 UT) tablet [id number]  Take 1,000 Units by mouth daily. [provider]  Active   clotrimazole-betamethasone (LOTRISONE) cream [id number]  APPLY TOPICALLY AS DIRECTED TWICE DAILY  Patient taking differently: Apply 1 application topically 2 (two) times daily as needed (BACTERIAL INFECTION). APPLY TOPICALLY AS DIRECTED TWICE DAILY   Baity, Coralie Keens, NP  Active Self  diphenhydrAMINE (BENADRYL) 25 mg capsule [id number]  Take 50 mg by mouth at bedtime.  [provider]  Active Self  doxycycline (ADOXA) 100 MG tablet [id number] Yes Take 50 mg by mouth daily. [provider] 05/14/2021 Active   fluocinonide ointment (LIDEX) 0.05 % [id number]  Apply 1 application topically 2 (two) times  daily as needed (PSORIASIS BEHIND EARS).  [provider]  Active Self  folic acid (FOLVITE) 1 MG tablet [id number]  Take 1 mg by mouth daily. [provider]  Active Self           Med Note Winfield Cunas, Talynn Lebon A   Fri Oct 06, 2020  9:32 AM) Reports takes 3 tablets daily on Friday & Saturday and 1 tablet daily Sunday-Thursday as directed by Rheumatology  furosemide (LASIX) 40 MG tablet [id number] Yes Take 1 tablet by mouth once daily [name], NP 05/14/2021 Active   gabapentin (NEURONTIN) 300 MG capsule [id number]  Take 2 capsules (600 mg total) by mouth 3 (three) times daily. [name], NP  Active   losartan (COZAAR) 50 MG tablet [id number] Yes Take 1 tablet (50 mg total) by mouth daily. [name], NP 05/14/2021 Active   metFORMIN (GLUCOPHAGE) 500 MG tablet [id number] Yes Take 2 tabs PO in am and 1 tab PO in pm [name], NP 05/14/2021 Active   methotrexate (RHEUMATREX) 2.5 MG tablet [id number]  Take 25 mg by mouth every Friday. Caution:Chemotherapy. Protect from light. [provider]  Active Self           Med Note Channing Mutters, JENNIFER   Thu Nov 21, 2016 11:23 AM) PT AWARE TO STOP PRIOR TO PROCEDURE   nystatin ointment (MYCOSTATIN) [id number]  Apply 1 application topically 2 (two) times daily. [provider]  Active   Omega-3 Fatty Acids (FISH OIL PO) [id number]  Take 1 capsule by mouth in the morning.  1040 mg of fish oil in each capsule [provider]  Active Self  omeprazole (PRILOSEC) 40 MG capsule [id number] Yes Take 1 capsule (40 mg total) by mouth daily.  Patient taking differently: Take 20 mg by mouth 2 (two) times daily.   [name], NP 05/14/2021 Active   predniSONE (DELTASONE) 5 MG tablet [id number] Yes Take 5 mg by mouth daily. [provider] 05/14/2021 Active   rivaroxaban (XARELTO) 20 MG TABS tablet [id number] No Take 1 tablet (20 mg total) by mouth daily with supper. [name], NP 05/12/2021 Active   simvastatin  (ZOCOR) 10 MG tablet [id number] Yes Take 1 tablet (10 mg total) by mouth daily. [name], NP 05/14/2021 Active   SOOLANTRA 1 % CREA [id number]  APPLY TO THE [facility] DAY [provider]  Active   vitamin C (ASCORBIC ACID) 500 MG tablet [id number] Yes Take 1,000 mg by mouth daily.  [provider] Past Week Active   Zinc 25 MG TABS [id number] Yes Take by mouth. [provider] Past Week Active             Pertinent Labs:  Lab Results  Component Value Date   HGBA1C 7.1 (H) 04/10/2021   Lab Results  Component Value Date   CHOL 161 04/10/2021   HDL 55 04/10/2021   LDLCALC 74 04/10/2021   LDLDIRECT 142.0 05/16/2014   TRIG 233 (H) 04/10/2021   CHOLHDL 2.9 04/10/2021   Lab Results  Component Value Date   CREATININE 0.93 04/10/2021   BUN 14 04/10/2021   NA 140 04/10/2021   K 4.1 04/10/2021   CL 102 04/10/2021   CO2 29 04/10/2021    SDOH:  (Social Determinants of Health) assessments and interventions performed:    [place]  Review of patient past medical history, allergies, medications, health status, including review of consultants reports, laboratory and other test data, was performed as part of comprehensive evaluation and provision of chronic care management services.   Care Plan : PharmD - Medication Management/Assistance  Updates made by Rennis Petty, RPH-CPP since 06/27/2021 12:00 AM     Problem: Disease Progression      Long-Range Goal: Disease Progression Prevented or Minimized   Start Date: 09/29/2020  Expected End Date: 12/28/2020  Recent Progress: On track  Priority: High  Note:   Current Barriers:  Unable to independently afford treatment regimen Fear of injections  Pharmacist Clinical Goal(s):  Over the next 90 days, patient will achieve control of blood sugar control as evidenced by A1C through collaboration with PharmD and provider.   Interventions: 1:1 collaboration with [name], NP regarding  development and update of comprehensive plan of care as evidenced by provider attestation and co-signature Inter-disciplinary care team collaboration (see longitudinal plan of care) Note patient's husband helps manage her medications and fills weekly pillbox for patient Denies missed doses of medications Today reports has had a difficult time over past 2 months as daughter hospitalized for > 1 month and patient was back and forth to hospital/helping to care for her Reports plans to call to schedule mammogram  Medication Assistance: Note patient now has Medicare coverage, as of 04/12/2021, including [employer] Silver [employer] Plus Part D coverage Patient previously approved for patient assistance for Xarelto from [employer], but no longer eligible as has insurance coverage Review health plan Part D benefit information per [employer] with patient. Note based on formulary, Xarelto is a tier 3 option for patient Patient  now using [facility] mail order for maximum savings Note patient previously seen by [facility], last on 04/30/2019 regarding history of DVT.  Previously reported unable to return for follow up with Dr. Tasia Catchings due to financial concerns Reports with Medicare coverage, now able to schedule follow up with provider for further evaluation/discussion regarding medication management and will call to schedule an appointment Patient to follow up with office if in need of new referral from PCP  T2DM: Uncontrolled based on latest A1C; current treatment: Metformin 500 mg - 2 tablets (1000 mg) with breakfast and 1 tablet (500 mg) with supper  Note patient on prednisone 5 mg daily from Rheumatology Reports recent morning fasting blood sugar readings: Today: 148 (notes had small piece of Valentines cake last night) 2/14: 139 2/13: 127 Reports recently had changes to her eating habits and unable to keep up with blood sugar monitoring as back and forth to hospital, caring for  daughter. States now working to get back to previously made changes to her diet and using her blood sugar readings as feedback to help determine impact of dietary choices on blood sugar Stopped processed foods, limiting carbohydrates and having balanced meals Exercise: Reports now getting back to using pedaling exerciser machine on both arms and legs - 15 mins each (30 minutes total/day) x 7 days/week   Hyperlipidemia: Current treatment: Simvastatin 10 mg QHS Omega-3 supplement daily Discuss impact of exercise on lipid-control  Patient Goals/Self-Care Activities Over the next 90 days, patient will:  - take medications as prescribed as evidenced by patient report and record review - check glucose, document, and provide at future appointments  - attend medical appointments as scheduled   Next appointment with PCP on 5/31  Follow Up Plan: Telephone follow up appointment with care management team member scheduled for: 11/07/2021 at 8:30 AM       Wallace Cullens, PharmD, [place], [place] [phone]

## 2021-06-27 NOTE — Patient Instructions (Signed)
Visit Information  Thank you for taking time to visit with me today. Please don't hesitate to contact me if I can be of assistance to you before our next scheduled telephone appointment.  Following are the goals we discussed today:   Goals Addressed             This Visit's Progress    Pharmacy Goals       Our goal A1c is less than 7%. This corresponds with fasting sugars less than 130 and 2 hour after meal sugars less than 180. Please keep a log when you check your blood sugar  Our goal bad cholesterol, or LDL, is less than 70 . This is why it is important to continue taking your simvastatin.  Please check your home blood pressure, keep a log of the results and bring this with you to your medical appointments.  Feel free to call me with any questions or concerns. I look forward to our next call!  Wallace Cullens, PharmD, Para [place], CPP Clinical Pharmacist [facility] [phone]         Our next appointment is by telephone on 11/07/2021 at 8:30 AM  Please call the care guide team at [phone] if you need to cancel or reschedule your appointment.    Patient verbalizes understanding of instructions and care plan provided today and agrees to view in [place]. Active MyChart status confirmed with patient.

## 2021-07-10 DIAGNOSIS — E7800 Pure hypercholesterolemia, unspecified: Secondary | ICD-10-CM | POA: Diagnosis not present

## 2021-07-10 DIAGNOSIS — E0843 Diabetes 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 autonomic (poly)neuropathy: Secondary | ICD-10-CM | POA: Diagnosis not present

## 2021-07-13 ENCOUNTER — Ambulatory Visit: Payer: Medicare Other | Admitting: Pharmacist

## 2021-07-13 DIAGNOSIS — Z86718 Personal history of other venous thrombosis and embolism: Secondary | ICD-10-CM

## 2021-07-13 NOTE — Patient Instructions (Signed)
Visit Information ? ?Thank you for taking time to visit with me today. Please don't hesitate to contact me if I can be of assistance to you before our next scheduled telephone appointment. ? ?Following are the goals we discussed today:  ? Goals Addressed   ? ?  ?  ?  ?  ? This Visit's Progress  ?  Pharmacy Goals     ?  Our goal A1c is less than 7%. This corresponds with fasting sugars less than 130 and 2 hour after meal sugars less than 180. Please keep a log when you check your blood sugar ? ?Our goal bad cholesterol, or LDL, is less than 70 . This is why it is important to continue taking your simvastatin. ? ?Please check your home blood pressure, keep a log of the results and bring this with you to your medical appointments. ? ?Feel free to call me with any questions or concerns. I look forward to our next call! ? ? ?Wallace Cullens, PharmD, BCACP, CPP ?Clinical Pharmacist ?[facility] ?[place] ?[phone] ?  ? ?  ? ? ? ?Our next appointment is by telephone on 11/07/2021 at 8:30 AM ? ?Please call the care guide team at [phone] if you need to cancel or reschedule your appointment.  ? ? ?Patient verbalizes understanding of instructions and care plan provided today and agrees to view in [place]. Active MyChart status confirmed with patient.   ? ?

## 2021-07-13 NOTE — Chronic Care Management (AMB) (Signed)
? ?Chronic Care Management ?CCM Pharmacy Note ? ?07/13/2021 ?Name:  Melissa Cabrera MRN:  [id number] DOB:  14-Mar-1956 ? ?Subjective: ?Melissa Cabrera is an 66 y.o. year old female who is a primary patient of [name], NP.  The CCM team was consulted for assistance with disease management and care coordination needs.   ? ?Receive a message from patient requesting a call back regarding medication cost concern ? ?Engaged with patient by telephone for follow up visit for pharmacy case management and/or care coordination services.  ? ?Objective: ? ?Medications Reviewed Today   ? ? Reviewed by Rennis Petty, RPH-CPP (Pharmacist) on 06/27/21 at [id number]  Med List Status: <None>  ? ?Medication Order Taking? Sig Documenting Provider Last Dose Status Informant  ?acetaminophen (TYLENOL) 650 MG CR tablet [id number]  Take 1,300 mg by mouth daily. [provider]  Active   ?Apremilast (OTEZLA) 30 MG TABS [id number]  Take 1 tablet by mouth 2 (two) times daily.  Rosita Kea, PA-C  Active   ?betamethasone dipropionate (DIPROLENE) 0.05 % ointment [id number]  Apply 1 application topically 2 (two) times daily as needed. psoriasis [provider]  Active Self  ?cetirizine (ZYRTEC) 10 MG tablet [id number]  Take 10 mg by mouth at bedtime.  [provider]  Active Self  ?Cholecalciferol 25 MCG (1000 UT) tablet [id number]  Take 1,000 Units by mouth daily. [provider]  Active   ?clotrimazole-betamethasone (LOTRISONE) cream [id number]  APPLY TOPICALLY AS DIRECTED TWICE DAILY  ?Patient taking differently: Apply 1 application topically 2 (two) times daily as needed (BACTERIAL INFECTION). APPLY TOPICALLY AS DIRECTED TWICE DAILY  ? [name], NP  Active Self  ?diphenhydrAMINE (BENADRYL) 25 mg capsule [id number]  Take 50 mg by mouth at bedtime.  [provider]  Active Self  ?doxycycline (ADOXA) 100 MG tablet [id number]  Take 50 mg by mouth daily. [provider]  Active   ?fluocinonide  ointment (LIDEX) 0.05 % [id number]  Apply 1 application topically 2 (two) times daily as needed (PSORIASIS BEHIND EARS).  [provider]  Active Self  ?folic acid (FOLVITE) 1 MG tablet [id number]  Take 1 mg by mouth daily. [provider]  Active Self  ?         ?Med Note Winfield Cunas, Maeci Kalbfleisch A   Fri Oct 06, 2020  9:32 AM) Reports takes 3 tablets daily on Friday & Saturday and 1 tablet daily Sunday-Thursday as directed by Rheumatology  ?furosemide (LASIX) 40 MG tablet [id number]  Take 1 tablet (40 mg total) by mouth daily. [name], NP  Active   ?gabapentin (NEURONTIN) 300 MG capsule [id number]  Take 2 capsules (600 mg total) by mouth 3 (three) times daily. [name], NP  Active   ?losartan (COZAAR) 50 MG tablet [id number]  Take 1 tablet (50 mg total) by mouth daily. [name], NP  Active   ?metFORMIN (GLUCOPHAGE) 500 MG tablet [id number] Yes Take 2 tabs PO in am and 1 tab PO in pm [name], NP Taking Active   ?methotrexate (RHEUMATREX) 2.5 MG tablet [id number]  Take 25 mg by mouth every Friday. Caution:Chemotherapy. Protect from light. [provider]  Active Self  ?         ?Med Note (BARLEY, JENNIFER   Thu Nov 21, 2016 11:23 AM) PT AWARE TO STOP PRIOR TO PROCEDURE   ?nystatin ointment (MYCOSTATIN) [id number]  Apply 1 application topically 2 (two) times daily. [provider]  Active   ?Omega-3 Fatty  Acids (FISH OIL PO) [id number] Yes Take 1 capsule by mouth in the morning. 1040 mg of fish oil in each capsule [provider] Taking Active Self  ?omeprazole (PRILOSEC) 40 MG capsule [id number]  Take 1 capsule (40 mg total) by mouth daily. [name], NP  Active   ?predniSONE (DELTASONE) 5 MG tablet [id number] Yes Take 5 mg by mouth daily. [provider] Taking Active   ?rivaroxaban (XARELTO) 20 MG TABS tablet [id number]  Take 1 tablet (20 mg total) by mouth daily with supper. [name], NP  Active   ?simvastatin (ZOCOR) 10 MG tablet  [id number] Yes Take 1 tablet (10 mg total) by mouth daily. [name], NP Taking Active   ?SOOLANTRA 1 % CREA [id number]  APPLY TO THE [facility] DAY [provider]  Active   ?vitamin C (ASCORBIC ACID) 500 MG tablet [id number]  Take 1,000 mg by mouth daily.  [provider]  Active   ?Zinc 25 MG TABS [id number]  Take by mouth. [provider]  Active   ? ?  ?  ? ?  ? ? ?Pertinent Labs:  ? ?Lab Results  ?Component Value Date  ? CREATININE 0.93 04/10/2021  ? BUN 14 04/10/2021  ? NA 140 04/10/2021  ? K 4.1 04/10/2021  ? CL 102 04/10/2021  ? CO2 29 04/10/2021  ? ? ?SDOH:  (Social Determinants of Health) assessments and interventions performed:  ? ? ?CCM Care Plan ? ?Review of patient past medical history, allergies, medications, health status, including review of consultants reports, laboratory and other test data, was performed as part of comprehensive evaluation and provision of chronic care management services.  ? ?Care Plan : PharmD - Medication Management/Assistance  ?Updates made by Rennis Petty, RPH-CPP since 07/13/2021 12:00 AM  ?  ? ?Problem: Disease Progression   ?  ? ?Long-Range Goal: Disease Progression Prevented or Minimized   ?Start Date: 09/29/2020  ?Expected End Date: 12/28/2020  ?Recent Progress: On track  ?Priority: High  ?Note:   ?Current Barriers:  ?Unable to independently afford treatment regimen ?Fear of injections ? ?Pharmacist Clinical Goal(s):  ?Over the next 90 days, patient will achieve control of blood sugar control as evidenced by A1C through collaboration with PharmD and provider.  ? ?Interventions: ?1:1 collaboration with [name], NP regarding development and update of comprehensive plan of care as evidenced by provider attestation and co-signature ?Inter-disciplinary care team collaboration (see longitudinal plan of care) ?Note patient's husband helps manage her medications and fills weekly pillbox for patient ?Receive a message from patient  requesting a call back regarding medication cost concern. Return call to patient ? ?Medication Assistance: ?Note patient now has Medicare coverage, as of 04/12/2021, including [employer] Silver Script Plus Part D coverage ?Patient previously approved for patient assistance for Xarelto from [facility], but no longer eligible as has insurance coverage ?Today patient reports cost of Xarelto has now increased through [place] plan to ~$140/month, but that this cost is not affordable to her ?Note Event organiser for Xarelto available from manufacturer starting in April 2023 - Xarelto would cost patient $85/month ?Patient states this cost is unaffordable ?Patient reports did contact manufacturer and received a coupon for 30 day supply from program ?States she is interested in seeing if a switch to warfarin would be appropriate for her ?Note patient previously seen by [facility], last on 04/30/2019 regarding history of DVT.  ?Previously reported unable to return for  follow up with Dr. Tasia Catchings due to financial concerns ?Reports with Medicare coverage, now able to schedule follow up with provider for further evaluation/discussion regarding medication management and will call to schedule an appointment ?Patient to follow up with office if in need of new referral from PCP ? ? ?Patient Goals/Self-Care Activities ?Over the next 90 days, patient will:  ?- take medications as prescribed as evidenced by patient report and record review ?- check glucose, document, and provide at future appointments ? - attend medical appointments as scheduled ?  Next appointment with PCP on 5/31 ? ?Follow Up Plan: Telephone follow up appointment with care management team member scheduled for: 11/07/2021 at 8:30 AM ? ?  ?  ? ?Wallace Cullens, PharmD, BCACP, CPP ?Clinical Pharmacist ?[facility] ?[place] ?[phone] ? ? ? ? ? ?

## 2021-08-03 ENCOUNTER — Encounter: Payer: Self-pay | Admitting: Internal Medicine

## 2021-08-06 NOTE — Telephone Encounter (Signed)
This pt is wanting to transition to Coumadin due to cost. Do you have an idea of what mg I should start her on to transition her. ?

## 2021-08-07 ENCOUNTER — Ambulatory Visit: Payer: Self-pay | Admitting: Pharmacist

## 2021-08-07 DIAGNOSIS — Z86718 Personal history of other venous thrombosis and embolism: Secondary | ICD-10-CM

## 2021-08-07 NOTE — Patient Instructions (Signed)
Visit Information ? ?Thank you for taking time to visit with me today. Please don't hesitate to contact me if I can be of assistance to you before our next scheduled telephone appointment. ? ?Following are the goals we discussed today:  ?Please contact [place] (Phone: [phone]) to schedule a follow up appointment ? ?Our next appointment is by telephone on 08/17/2021 at 8:30 AM ? ?Please call the care guide team at [phone] if you need to cancel or reschedule your appointment.  ? ? ?Patient verbalizes understanding of instructions and care plan provided today and agrees to view in [place]. Active MyChart status confirmed with patient.   ? ?

## 2021-08-07 NOTE — Chronic Care Management (AMB) (Signed)
? ?Chronic Care Management ?CCM Pharmacy Note ? ?08/07/2021 ?Name:  Melissa Cabrera MRN:  [id number] DOB:  October 21, 1955 ? ?Subjective: ?Melissa Cabrera is an 66 y.o. year old female who is a primary patient of [name], NP.  The CCM team was consulted for assistance with disease management and care coordination needs.   ? ?Engaged with patient by telephone for follow up visit for pharmacy case management and/or care coordination services.  ? ?Objective: ? ?Medications Reviewed Today   ? ? Reviewed by Rennis Petty, RPH-CPP (Pharmacist) on 06/27/21 at [phone]  Med List Status: <None>  ? ?Medication Order Taking? Sig Documenting Provider Last Dose Status Informant  ?acetaminophen (TYLENOL) 650 MG CR tablet [id number]  Take 1,300 mg by mouth daily. [provider]  Active   ?Apremilast (OTEZLA) 30 MG TABS [id number]  Take 1 tablet by mouth 2 (two) times daily.  Rosita Kea, PA-C  Active   ?betamethasone dipropionate (DIPROLENE) 0.05 % ointment [id number]  Apply 1 application topically 2 (two) times daily as needed. psoriasis [provider]  Active Self  ?cetirizine (ZYRTEC) 10 MG tablet [id number]  Take 10 mg by mouth at bedtime.  [provider]  Active Self  ?Cholecalciferol 25 MCG (1000 UT) tablet [id number]  Take 1,000 Units by mouth daily. [provider]  Active   ?clotrimazole-betamethasone (LOTRISONE) cream [id number]  APPLY TOPICALLY AS DIRECTED TWICE DAILY  ?Patient taking differently: Apply 1 application topically 2 (two) times daily as needed (BACTERIAL INFECTION). APPLY TOPICALLY AS DIRECTED TWICE DAILY  ? [name], NP  Active Self  ?diphenhydrAMINE (BENADRYL) 25 mg capsule [id number]  Take 50 mg by mouth at bedtime.  [provider]  Active Self  ?doxycycline (ADOXA) 100 MG tablet [id number]  Take 50 mg by mouth daily. [provider]  Active   ?fluocinonide ointment (LIDEX) 0.05 % [id number]  Apply 1 application topically 2 (two) times daily as  needed (PSORIASIS BEHIND EARS).  [provider]  Active Self  ?folic acid (FOLVITE) 1 MG tablet [id number]  Take 1 mg by mouth daily. [provider]  Active Self  ?         ?Med Note Winfield Cunas, Leylah Tarnow A   Fri Oct 06, 2020  9:32 AM) Reports takes 3 tablets daily on Friday & Saturday and 1 tablet daily Sunday-Thursday as directed by Rheumatology  ?furosemide (LASIX) 40 MG tablet [id number]  Take 1 tablet (40 mg total) by mouth daily. [name], NP  Active   ?gabapentin (NEURONTIN) 300 MG capsule [id number]  Take 2 capsules (600 mg total) by mouth 3 (three) times daily. [name], NP  Active   ?losartan (COZAAR) 50 MG tablet [id number]  Take 1 tablet (50 mg total) by mouth daily. [name], NP  Active   ?metFORMIN (GLUCOPHAGE) 500 MG tablet [id number] Yes Take 2 tabs PO in am and 1 tab PO in pm [name], NP Taking Active   ?methotrexate (RHEUMATREX) 2.5 MG tablet [id number]  Take 25 mg by mouth every Friday. Caution:Chemotherapy. Protect from light. [provider]  Active Self  ?         ?Med Note (BARLEY, JENNIFER   Thu Nov 21, 2016 11:23 AM) PT AWARE TO STOP PRIOR TO PROCEDURE   ?nystatin ointment (MYCOSTATIN) [id number]  Apply 1 application topically 2 (two) times daily. [provider]  Active   ?Omega-3 Fatty Acids (FISH OIL PO) [id number] Yes Take 1 capsule by mouth in the morning.  1040 mg of fish oil in each capsule [provider] Taking Active Self  ?omeprazole (PRILOSEC) 40 MG capsule [id number]  Take 1 capsule (40 mg total) by mouth daily. [name], NP  Active   ?predniSONE (DELTASONE) 5 MG tablet [id number] Yes Take 5 mg by mouth daily. [provider] Taking Active   ?rivaroxaban (XARELTO) 20 MG TABS tablet [id number]  Take 1 tablet (20 mg total) by mouth daily with supper. [name], NP  Active   ?simvastatin (ZOCOR) 10 MG tablet [id number] Yes Take 1 tablet (10 mg total) by mouth daily. [name], NP Taking Active    ?SOOLANTRA 1 % CREA [id number]  APPLY TO THE [facility] DAY [provider]  Active   ?vitamin C (ASCORBIC ACID) 500 MG tablet [id number]  Take 1,000 mg by mouth daily.  [provider]  Active   ?Zinc 25 MG TABS [id number]  Take by mouth. [provider]  Active   ? ?  ?  ? ?  ? ? ?Pertinent Labs:  ? ?Lab Results  ?Component Value Date  ? CREATININE 0.93 04/10/2021  ? BUN 14 04/10/2021  ? NA 140 04/10/2021  ? K 4.1 04/10/2021  ? CL 102 04/10/2021  ? CO2 29 04/10/2021  ? ? ?SDOH:  (Social Determinants of Health) assessments and interventions performed:  ? ? ?CCM Care Plan ? ?Review of patient past medical history, allergies, medications, health status, including review of consultants reports, laboratory and other test data, was performed as part of comprehensive evaluation and provision of chronic care management services.  ? ?Care Plan : PharmD - Medication Management/Assistance  ?Updates made by Rennis Petty, RPH-CPP since 08/07/2021 12:00 AM  ?  ? ?Problem: Disease Progression   ?  ? ?Long-Range Goal: Disease Progression Prevented or Minimized   ?Start Date: 09/29/2020  ?Expected End Date: 12/28/2020  ?Recent Progress: On track  ?Priority: High  ?Note:   ?Current Barriers:  ?Unable to independently afford treatment regimen ?Fear of injections ? ?Pharmacist Clinical Goal(s):  ?Over the next 90 days, patient will achieve control of blood sugar control as evidenced by A1C through collaboration with PharmD and provider.  ? ?Interventions: ?1:1 collaboration with [name], NP regarding development and update of comprehensive plan of care as evidenced by provider attestation and co-signature ?Inter-disciplinary care team collaboration (see longitudinal plan of care) ?Note patient's husband helps manage her medications and fills weekly pillbox for patient ?Received message from PCP as patient had contacted provider regarding interest in switching from Xarelto to warfarin due to  medication cost concerns ? ?Medication Assistance/History of DVT: ?Note patient now has Medicare coverage, as of 04/12/2021, including [employer] Part D coverage ?Patient previously approved for patient assistance for Xarelto from [facility], but no longer eligible as has insurance coverage ?Note Event organiser for Xarelto available from manufacturer starting in April 2023 - Xarelto would cost patient $85/month ?Patient has stated this cost is unaffordable ?Note patient did contact manufacturer and received a coupon for 30 day supply from program ?From review of chart, note patient seen by Dr. 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 Per provider note, Dr. Tasia Catchings recommended long-term anticoagulation, checked prothrombin gene mutation and factor V Leiden mutation (both negative -no mutation found), but held other hypercoagulable work-up at that point during the acute setting and advised patient to return for follow up  in 3 months.  ?Patient previously reported unable to return for follow up with Dr. Tasia Catchings due to financial concerns ?Reports with Medicare coverage, now able to schedule follow up with provider for further evaluation ?Collaborate with PCP and Dr. Parks Ranger to recommend patient schedule follow up with Hematologist to complete work up/determine treatment duration and, if appropriate per provider, to request anticoagulation clinic make patient's switch from [facility] to warfarin initiation ?Follow up with patient today. Patient states that she will call [facility] to schedule follow up ?Patient to follow up with office if in need of new referral from PCP ? ? ?Patient Goals/Self-Care Activities ?Over the next 90 days, patient will:  ?- take medications as prescribed as evidenced by patient report and record review ?- check glucose, document, and provide at future  appointments ? - attend medical appointments as scheduled ?  Next appointment with PCP on 5/31 ? ?Follow Up Plan: Telephone follow up appointment with care management team member scheduled for: 08/17/2021 at 8:30 AM ? ?  ?  ?E

## 2021-08-08 ENCOUNTER — Other Ambulatory Visit: Payer: Self-pay | Admitting: Internal Medicine

## 2021-08-09 NOTE — Telephone Encounter (Signed)
Requested Prescriptions  ?Pending Prescriptions Disp Refills  ?? omeprazole (PRILOSEC) 40 MG capsule [Pharmacy Med Name: OMEPRAZOLE CAP 40MG] 90 capsule 0  ?  Sig: TAKE 1 CAPSULE DAILY  ?  ? Gastroenterology: Proton Pump Inhibitors Passed - 08/08/2021  7:48 PM  ?  ?  Passed - Valid encounter within last 12 months  ?  Recent Outpatient Visits   ?      ? 4 months ago Welcome to [employer] preventive visit  ? [facility], [place] W, NP  ? 10 months ago Diabetes 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, unspecified whether long term insulin use ([place])  ? [facility] [place], Coralie Keens, NP  ?  ?  ?Future Appointments   ?        ? In 2 months Baity, Coralie Keens, NP [facility], [place]  ?  ? ?  ?  ?  ?? metFORMIN (GLUCOPHAGE) 500 MG tablet [Pharmacy Med Name: METFORMIN TAB 500MG] 270 tablet 0  ?  Sig: TAKE 2 TABLETS EVERY       MORNING AND 1 TABLET EVERY EVENING  ?  ? Endocrinology:  Diabetes - Biguanides Failed - 08/08/2021  7:48 PM  ?  ?  Failed - B12 Level in normal range and within 720 days  ?  No results found for: VITAMINB12   ?  ?  Failed - CBC within normal limits and completed in the last 12 months  ?  WBC  ?Date Value Ref Range Status  ?04/10/2021 7.9 3.8 - 10.8 Thousand/uL Final  ? ?RBC  ?Date Value Ref Range Status  ?04/10/2021 4.41 3.80 - 5.10 Million/uL Final  ? ?Hemoglobin  ?Date Value Ref Range Status  ?04/10/2021 13.3 11.7 - 15.5 g/dL Final  ?08/16/2016 14.5 11.1 - 15.9 g/dL Final  ? ?HCT  ?Date Value Ref Range Status  ?04/10/2021 40.7 35.0 - 45.0 % Final  ? ?Hematocrit  ?Date Value Ref Range Status  ?08/16/2016 42.8 34.0 - 46.6 % Final  ? ?MCHC  ?Date Value Ref Range Status  ?04/10/2021 32.7 32.0 - 36.0 g/dL Final  ? ?MCH  ?Date Value Ref Range Status  ?04/10/2021 30.2 27.0 - 33.0 pg Final  ? ?MCV  ?Date Value Ref Range Status  ?04/10/2021 92.3 80.0 - 100.0 fL Final  ?08/16/2016 86 79 - 97 fL Final  ? ?No results found for: PLTCOUNTKUC,  LABPLAT, [place] ?RDW  ?Date Value Ref Range Status  ?04/10/2021 14.1 11.0 - 15.0 % Final  ?08/16/2016 13.5 12.3 - 15.4 % Final  ? ?  ?  ?  Passed - Cr in normal range and within 360 days  ?  Creat  ?Date Value Ref Range Status  ?04/10/2021 0.93 0.50 - 1.05 mg/dL Final  ? ?Creatinine,U  ?Date Value Ref Range Status  ?10/29/2016 153.4 mg/dL Final  ?   ?  ?  Passed - HBA1C is between 0 and 7.9 and within 180 days  ?  Hgb A1c MFr Bld  ?Date Value Ref Range Status  ?04/10/2021 7.1 (H) <5.7 % of total Hgb Final  ?  Comment:  ?  For someone without known diabetes, a hemoglobin A1c ?value of 6.5% or greater indicates that they may have  ?diabetes and this should be confirmed with a follow-up  ?test. ?. ?For someone with known diabetes, a value <7% indicates  ?that their diabetes is well controlled and a value  ?greater than or equal to 7% indicates  suboptimal  ?control. A1c targets should be individualized based on  ?duration of diabetes, age, comorbid conditions, and  ?other considerations. ?. ?Currently, no consensus exists regarding use of ?hemoglobin A1c for diagnosis of diabetes for children. ?. ?  ?   ?  ?  Passed - eGFR in normal range and within 360 days  ?  GFR calc Af Amer  ?Date Value Ref Range Status  ?04/30/2019 >60 >60 mL/min Final  ? ?GFR calc non Af Amer  ?Date Value Ref Range Status  ?04/30/2019 >60 >60 mL/min Final  ? ?GFR  ?Date Value Ref Range Status  ?12/21/2019 53.40 (L) >60.00 mL/min Final  ? ?eGFR  ?Date Value Ref Range Status  ?04/10/2021 68 > OR = 60 mL/min/1.51m Final  ?  Comment:  ?  The eGFR is based on the CKD-EPI 2021 equation. To calculate  ?the new eGFR from a previous Creatinine or Cystatin C ?result, go to https://www.kidney.org/professionals/ ?kdoqi/gfr%5Fcalculator ?  ?   ?  ?  Passed - Valid encounter within last 6 months  ?  Recent Outpatient Visits   ?      ? 4 months [facility] [employer] [place]  ? 10 months ago Diabetes  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, [place] whether [place]  ? [facility] [place] [facility]  ?  ?  ?Future Appointments   ?        ? In 2 months Baity, [facility] [place] ?  ? ?  ?  ?  ? ?

## 2021-08-15 ENCOUNTER — Encounter: Payer: Self-pay | Admitting: Internal Medicine

## 2021-08-17 ENCOUNTER — Other Ambulatory Visit: Payer: Self-pay | Admitting: Internal Medicine

## 2021-08-17 ENCOUNTER — Ambulatory Visit (INDEPENDENT_AMBULATORY_CARE_PROVIDER_SITE_OTHER): Payer: Medicare Other | Admitting: Pharmacist

## 2021-08-17 DIAGNOSIS — I10 Essential (primary) hypertension: Secondary | ICD-10-CM

## 2021-08-17 DIAGNOSIS — E0843 Diabetes 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 autonomic (poly)neuropathy: Secondary | ICD-10-CM

## 2021-08-17 NOTE — Chronic Care Management (AMB) (Signed)
? ?Chronic Care Management ?CCM Pharmacy Note ? ?08/17/2021 ?Name:  Melissa Cabrera MRN:  [id number] DOB:  1956-03-31 ? ? ?Subjective: ?Melissa Cabrera is an 66 y.o. year old female who is a primary patient of [name], NP.  The CCM team was consulted for assistance with disease management and care coordination needs.   ? ?Engaged with patient by telephone for follow up visit for pharmacy case management and/or care coordination services.  ? ?Objective: ? ?Medications Reviewed Today   ? ? Reviewed by Rennis Petty, RPH-CPP (Pharmacist) on 08/17/21 at [place] List Status: <None>  ? ?Medication Order Taking? Sig Documenting Provider Last Dose Status Informant  ?acetaminophen (TYLENOL) 650 MG CR tablet [id number] Yes Take 1,300 mg by mouth daily. [provider] Taking Active   ?betamethasone dipropionate (DIPROLENE) 0.05 % ointment [id number] No Apply 1 application topically 2 (two) times daily as needed. psoriasis  ?Patient not taking: Reported on 08/17/2021  ? [provider] Not Taking Active Self  ?cetirizine (ZYRTEC) 10 MG tablet [id number] Yes Take 10 mg by mouth at bedtime.  [provider] Taking Active Self  ?Cholecalciferol 25 MCG (1000 UT) tablet [id number] Yes Take 1,000 Units by mouth daily. [provider] Taking Active   ?clotrimazole-betamethasone (LOTRISONE) cream [id number] No APPLY TOPICALLY AS DIRECTED TWICE DAILY  ?Patient not taking: Reported on 08/17/2021  ? [name], NP Not Taking Active Self  ?diphenhydrAMINE (BENADRYL) 25 mg capsule [id number] Yes Take 50 mg by mouth at bedtime.  [provider] Taking Active Self  ?doxycycline (ADOXA) 100 MG tablet [id number] Yes Take 50 mg by mouth daily. [provider] Taking Active   ?fluocinonide ointment (LIDEX) 0.05 % [id number] No Apply 1 application topically 2 (two) times daily as needed (PSORIASIS BEHIND EARS).   ?Patient not taking: Reported on 08/17/2021  ? [provider] Not Taking  Active Self  ?folic acid (FOLVITE) 1 MG tablet [id number] Yes Take 1 mg by mouth daily. [provider] Taking Active Self  ?         ?Med Note Winfield Cunas, [place] Cordts A   Fri Oct 06, 2020  9:32 AM) Reports takes 3 tablets daily on Friday & Saturday and 1 tablet daily Sunday-Thursday as directed by Rheumatology  ?furosemide (LASIX) 40 MG tablet [id number] Yes Take 1 tablet (40 mg total) by mouth daily. [name], NP Taking Active   ?gabapentin (NEURONTIN) 300 MG capsule [id number] Yes Take 2 capsules (600 mg total) by mouth 3 (three) times daily. [name], NP Taking Active   ?Golimumab ([place] D[place] IV) [id number] Yes Inject into the vein. Reports receives via infusion from Rheumatology clinic [provider]  Active   ?losartan (COZAAR) 50 MG tablet [id number] Yes Take 1 tablet (50 mg total) by mouth daily. [name], NP Taking Active   ?metFORMIN (GLUCOPHAGE) 500 MG tablet [id number] Yes TAKE 2 TABLETS EVERY       MORNING AND 1 TABLET EVERY EVENING Baity, Coralie Keens, NP Taking Active   ?methotrexate (RHEUMATREX) 2.5 MG tablet [id number] Yes Take 25 mg by mouth every Friday. Caution:Chemotherapy. Protect from light. [provider] Taking Active Self  ?         ?Med Note (BARLEY, JENNIFER   Thu Nov 21, 2016 11:23 AM) PT AWARE TO STOP PRIOR TO PROCEDURE   ?nystatin ointment (MYCOSTATIN) [id number]  Apply 1 application topically 2 (two) times daily. [provider]  Active   ?Omega-3 Fatty Acids (FISH OIL PO)  [id number] Yes Take 1 capsule by mouth in the morning. 1040 mg of fish oil in each capsule [provider] Taking Active Self  ?omeprazole (PRILOSEC) 40 MG capsule [id number] Yes TAKE 1 CAPSULE DAILY Baity, Coralie Keens, NP Taking Active   ?predniSONE (DELTASONE) 5 MG tablet [id number] Yes Take 5 mg by mouth daily. [provider] Taking Active   ?rivaroxaban (XARELTO) 20 MG TABS tablet [id number] Yes Take 1 tablet (20 mg total) by mouth daily with supper. [name], NP Taking Active   ?simvastatin (ZOCOR) 10 MG tablet [id number] Yes Take 1 tablet (10 mg total) by mouth daily. [name], NP Taking Active   ?SOOLANTRA 1 % CREA [id number] Yes APPLY TO THE FACE EVERY DAY [provider] Taking Active   ?vitamin C (ASCORBIC ACID) 500 MG tablet [id number] Yes Take 1,000 mg by mouth daily.  [provider] Taking Active   ?Zinc 25 MG TABS [id number] Yes Take by mouth. [provider] Taking Active   ? ?  ?  ? ?  ? ? ?Pertinent Labs:  ?Lab Results  ?Component Value Date  ? HGBA1C 7.1 (H) 04/10/2021  ? ?Lab Results  ?Component Value Date  ? CHOL 161 04/10/2021  ? HDL 55 04/10/2021  ? [place] 74 04/10/2021  ? LDLDIRECT 142.0 05/16/2014  ? TRIG 233 (H) 04/10/2021  ? CHOLHDL 2.9 04/10/2021  ? ?Lab Results  ?Component Value Date  ? CREATININE 0.93 04/10/2021  ? BUN 14 04/10/2021  ? NA 140 04/10/2021  ? K 4.1 04/10/2021  ? CL 102 04/10/2021  ? CO2 29 04/10/2021  ? ? ?SDOH:  (Social Determinants of Health) assessments and interventions performed:  ? ? ?CCM Care Plan ? ?Review of patient past medical history, allergies, medications, health status, including review of consultants reports, laboratory and other test data, was performed as part of comprehensive evaluation and provision of chronic care management services.  ? ?Care Plan : PharmD - Medication Management/Assistance  ?Updates made by Rennis Petty, RPH-CPP since 08/17/2021 12:00 AM  ?  ? ?Problem: Disease Progression   ?  ? ?Long-Range Goal: Disease Progression Prevented or Minimized   ?Start Date: 09/29/2020  ?Expected End Date: 12/28/2020  ?Recent Progress: On track  ?Priority: High  ?Note:   ?Current Barriers:  ?Unable to independently afford treatment regimen ?Fear of injections ? ?Pharmacist Clinical Goal(s):  ?Over the next 90 days, patient will achieve control of blood sugar control as evidenced by A1C through collaboration with PharmD and provider.  ? ?Interventions: ?1:1 collaboration  with [name], NP regarding development and update of comprehensive plan of care as evidenced by provider attestation and co-signature ?Inter-disciplinary care team collaboration (see longitudinal plan of care) ?Note patient's husband helps manage her medications and fills weekly pillbox for patient ?Comprehensive medication review performed; medication list updated in electronic medical record ?Patient follows with [facility] and Rheumatologist manages her methotrexate, folic acid, gabapentin and tramadol ?Reports recently stopped [place] N[place] (due to cost) and started on Simponi Aria infusions by Rheumatology ?Have previously counseled on potential interaction between omeprazole and methotrexate, as Proton Pump Inhibitors (PPI) may impact the serum concentration of methotrexate ?Reported has taken current doses of both omeprazole and methotrexate long-term, Rheumatologist aware she is taking PPI and Rheumatologist draws labs to monitor ?Have previously counseled on risk of dizziness/sedation with tramadol, diphenhydramine, cetirizine and gabapentin. ?Note has reported takes both diphenhydramine and cetirizine as recommended by dermatologist for ezcema ? ?T2DM: ?Uncontrolled based on latest  A1C; current treatment: ?Metformin 500 mg - 2 tablets (1000 mg) with breakfast and 1 tablet (500 mg) with supper  ?Note patient on prednisone 5 mg daily from Rheumatology ?Reports Rheumatologist planning to try to decrease/stop if doing well on Simponi infusion (started on Wednesday) ?Denies checking blood sugar recently ?Reports recently has fallen off with checking her blood sugar and has recently been eating cinnamon candy ?States now working to get back to previously made changes to her diet and using her blood sugar readings as feedback to help determine impact of dietary choices on blood sugar ?Patient has stopped processed foods, limiting sweets and carbohydrates and having balanced meals ?Exercise: Reports  has not been exercising recently ?Reports planning to get back to using pedaling exerciser machine on both arms and legs ?Reports has noticed her weight has increased recently, latest home weight ~2

## 2021-08-17 NOTE — Patient Instructions (Signed)
Visit Information ? ?Thank you for taking time to visit with me today. Please don't hesitate to contact me if I can be of assistance to you before our next scheduled telephone appointment. ? ?Following are the goals we discussed today:  ? Goals Addressed   ? ?  ?  ?  ?  ? This Visit's Progress  ?  Pharmacy Goals     ?  Our goal A1c is less than 7%. This corresponds with fasting sugars less than 130 and 2 hour after meal sugars less than 180. Please keep a log when you check your blood sugar ? ?Our goal bad cholesterol, or LDL, is less than 70 . This is why it is important to continue taking your simvastatin. ? ?Please check your home blood pressure, keep a log of the results and bring this with you to your medical appointments. ? ?Feel free to call me with any questions or concerns. I look forward to our next call! ? ? ?Wallace Cullens, PharmD, BCACP, CPP ?Clinical Pharmacist ?[facility] ?[place] ?[phone] ?  ? ?  ? ? ? ?Our next appointment is by telephone on 09/10/2021 at 8:30 AM ? ?Please call the care guide team at [phone] if you need to cancel or reschedule your appointment.  ? ? ?Patient verbalizes understanding of instructions and care plan provided today and agrees to view in [place]. Active MyChart status confirmed with patient.   ? ?

## 2021-08-17 NOTE — Telephone Encounter (Signed)
Prilosec and Glucophage are duplicate requests. Neurontin and Zocor were filled 08/08/21 for 90 day supply. Has appt 10/10/21. ?Requested Prescriptions  ?Pending Prescriptions Disp Refills  ?? omeprazole (PRILOSEC) 40 MG capsule [Pharmacy Med Name: OMEPRAZOLE CAP 40MG] 90 capsule 0  ?  Sig: TAKE 1 CAPSULE DAILY  ?  ? Gastroenterology: Proton Pump Inhibitors Passed - 08/17/2021  1:14 AM  ?  ?  Passed - Valid encounter within last 12 months  ?  Recent Outpatient Visits   ?      ? 4 months ago Welcome to [employer] preventive visit  ? [facility] [place], [place] W, NP  ? 10 months ago Diabetes 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, unspecified whether long term insulin use ([place])  ? [facility] [place], Coralie Keens, NP  ?  ?  ?Future Appointments   ?        ? In 1 month Baity, Coralie Keens, NP [place]  ?  ? ?  ?  ?  ?? gabapentin (NEURONTIN) 300 MG capsule [Pharmacy Med Name: GABAPENTIN CAP 300MG] 540 capsule 1  ?  Sig: TAKE 2 CAPSULES(600MG      TOTAL) 3 TIMES A DAY  ?  ? Neurology: Anticonvulsants - gabapentin Passed - 08/17/2021  1:14 AM  ?  ?  Passed - Cr in normal range and within 360 days  ?  Creat  ?Date Value Ref Range Status  ?04/10/2021 0.93 0.50 - 1.05 mg/dL Final  ? ?Creatinine,U  ?Date Value Ref Range Status  ?10/29/2016 153.4 mg/dL Final  ?   ?  ?  Passed - Completed PHQ-2 or PHQ-9 in the last 360 days  ?  ?  Passed - Valid encounter within last 12 months  ?  Recent Outpatient Visits   ?      ? 4 months ago Welcome to [employer] preventive visit  ? [facility] [place], [place] W, NP  ? 10 months ago Diabetes 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, unspecified whether long term insulin use ([place])  ? [facility] [place], Coralie Keens, NP  ?  ?  ?Future Appointments   ?        ? In 1 month Baity, Coralie Keens, NP [place]  ?  ? ?  ?  ?  ?? simvastatin (ZOCOR) 10 MG  tablet [Pharmacy Med Name: SIMVASTATIN  TAB 10MG] 90 tablet 1  ?  Sig: TAKE 1 TABLET DAILY  ?  ? Cardiovascular:  Antilipid - Statins Failed - 08/17/2021  1:14 AM  ?  ?  Failed - Lipid Panel in normal range within the last 12 months  ?  Cholesterol  ?Date Value Ref Range Status  ?04/10/2021 161 <200 mg/dL Final  ? ?LDL Cholesterol (Calc)  ?Date Value Ref Range Status  ?04/10/2021 74 mg/dL (calc) Final  ?  Comment:  ?  Reference range: <100 ?Marland Kitchen ?Desirable range <100 mg/dL for primary prevention;   ?<70 mg/dL for patients with CHD or diabetic patients  ?with > or = 2 CHD risk factors. ?. ?LDL-C is now calculated using the Martin-Hopkins  ?calculation, which is a validated novel method providing  ?better accuracy than the Friedewald equation in the  ?estimation of LDL-C.  ?Cresenciano Genre et al. Annamaria Helling. 5277;824(23): 2061-2068  ?(http://education.Quest[employer].com/faq/FAQ164) ?  ? ?Direct LDL  ?Date Value Ref Range Status  ?05/16/2014 142.0 mg/dL Final  ?  Comment:  ?  Optimal:  <100 mg/dLNear or Above Optimal:  100-129 mg/dLBorderline High:  130-159 mg/dLHigh:  160-189 mg/dLVery High:  >190 mg/dL  ? ?HDL  ?Date Value Ref Range Status  ?04/10/2021 55 > OR = 50 mg/dL Final  ? ?Triglycerides  ?Date Value Ref Range Status  ?04/10/2021 233 (H) <150 mg/dL Final  ?  Comment:  ?  . ?If a non-fasting specimen was collected, consider ?repeat triglyceride testing on a fasting specimen ?if clinically indicated.  ?Garald Balding al. J. of Clin. Lipidol. 5638;7:564-332. ?. ?  ? ?  ?  ?  Passed - Patient is not pregnant  ?  ?  Passed - Valid encounter within last 12 months  ?  Recent Outpatient Visits   ?      ? 4 months ago Welcome to [employer] preventive visit  ? [facility] [place], [place] W, NP  ? 10 months ago Diabetes 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, unspecified whether long term insulin use ([place])  ? [facility] [place], Coralie Keens, NP  ?  ?  ?Future Appointments   ?         ? In 1 month Baity, Coralie Keens, NP [facility]  ?  ? ?  ?  ?  ?? metFORMIN (GLUCOPHAGE) 500 MG tablet [Pharmacy Med Name: METFORMIN TAB 500MG] 270 tablet 0  ?  Sig: TAKE 2 TABLETS EVERY       MORNING AND 1 TABLET EVERY EVENING  ?  ? Endocrinology:  Diabetes - Biguanides Failed - 08/17/2021  1:14 AM  ?  ?  Failed - B12 Level in normal range and within 720 days  ?  No results found for: VITAMINB12   ?  ?  Failed - CBC within normal limits and completed in the last 12 months  ?  WBC  ?Date Value Ref Range Status  ?04/10/2021 7.9 3.8 - 10.8 Thousand/uL Final  ? ?RBC  ?Date Value Ref Range Status  ?04/10/2021 4.41 3.80 - 5.10 Million/uL Final  ? ?Hemoglobin  ?Date Value Ref Range Status  ?04/10/2021 13.3 11.7 - 15.5 g/dL Final  ?08/16/2016 14.5 11.1 - 15.9 g/dL Final  ? ?HCT  ?Date Value Ref Range Status  ?04/10/2021 40.7 35.0 - 45.0 % Final  ? ?Hematocrit  ?Date Value Ref Range Status  ?08/16/2016 42.8 34.0 - 46.6 % Final  ? ?MCHC  ?Date Value Ref Range Status  ?04/10/2021 32.7 32.0 - 36.0 g/dL Final  ? ?MCH  ?Date Value Ref Range Status  ?04/10/2021 30.2 27.0 - 33.0 pg Final  ? ?MCV  ?Date Value Ref Range Status  ?04/10/2021 92.3 80.0 - 100.0 fL Final  ?08/16/2016 86 79 - 97 fL Final  ? ?No results found for: PLTCOUNTKUC, LABPLAT, [place] ?RDW  ?Date Value Ref Range Status  ?04/10/2021 14.1 11.0 - 15.0 % Final  ?08/16/2016 13.5 12.3 - 15.4 % Final  ? ?  ?  ?  Passed - Cr in normal range and within 360 days  ?  Creat  ?Date Value Ref Range Status  ?04/10/2021 0.93 0.50 - 1.05 mg/dL Final  ? ?Creatinine,U  ?Date Value Ref Range Status  ?10/29/2016 153.4 mg/dL Final  ?   ?  ?  Passed - HBA1C is between 0 and 7.9 and within 180 days  ?  Hgb A1c MFr Bld  ?Date Value Ref Range Status  ?04/10/2021 7.1 (H) <5.7 % of total Hgb Final  ?  Comment:  ?  For someone without known diabetes, a hemoglobin A1c ?value of 6.5% or greater indicates that they may have  ?diabetes and this should be confirmed with a follow-up   ?test. ?. ?For someone with known diabetes, a value <7% indicates  ?that their diabetes is well controlled and a value  ?greater than or equal to 7% indicates suboptimal  ?control. A1c targets should be individualized based on  ?duration of diabetes, age, comorbid conditions, and  ?other considerations. ?. ?Currently, no consensus exists regarding use of ?hemoglobin A1c for diagnosis of diabetes for children. ?. ?  ?   ?  ?  Passed - eGFR in normal range and within 360 days  ?  GFR calc Af Amer  ?Date Value Ref Range Status  ?04/30/2019 >60 >60 mL/min Final  ? ?GFR calc non Af Amer  ?Date Value Ref Range Status  ?04/30/2019 >60 >60 mL/min Final  ? ?GFR  ?Date Value Ref Range Status  ?12/21/2019 53.40 (L) >60.00 mL/min Final  ? ?eGFR  ?Date Value Ref Range Status  ?04/10/2021 68 > OR = 60 mL/min/1.64m Final  ?  Comment:  ?  The eGFR is based on the CKD-EPI 2021 equation. To calculate  ?the new eGFR from a previous Creatinine or Cystatin C ?result, go to https://www.kidney.org/professionals/ ?kdoqi/gfr%5Fcalculator ?  ?   ?  ?  Passed - Valid encounter within last 6 months  ?  Recent Outpatient Visits   ?      ? 4 months ago [facility] [employer] [facility]  ? 10 months ago Diabetes 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, unspecified whether long term insulin use ([place]  ? [facility] [place] [facility]  ?  ?  ?Future Appointments   ?        ? In 1 month Baity, [facility] [place] ?  ? ?  ?  ?  ? ? ?

## 2021-09-03 ENCOUNTER — Inpatient Hospital Stay: Payer: Medicare Other | Attending: Oncology | Admitting: Oncology

## 2021-09-03 ENCOUNTER — Encounter: Payer: Self-pay | Admitting: Oncology

## 2021-09-03 ENCOUNTER — Encounter: Payer: Self-pay | Admitting: Internal Medicine

## 2021-09-03 VITALS — BP 140/69 | HR 70 | Temp 97.5°F | Resp 18 | Wt 232.3 lb

## 2021-09-03 DIAGNOSIS — Z79899 Other long term (current) drug therapy: Secondary | ICD-10-CM | POA: Insufficient documentation

## 2021-09-03 DIAGNOSIS — Z87891 Personal history of nicotine dependence: Secondary | ICD-10-CM

## 2021-09-03 DIAGNOSIS — Z8542 Personal history of malignant neoplasm of other parts of uterus: Secondary | ICD-10-CM

## 2021-09-03 DIAGNOSIS — Z86718 Personal history of other venous thrombosis and embolism: Secondary | ICD-10-CM

## 2021-09-03 DIAGNOSIS — Z7901 Long term (current) use of anticoagulants: Secondary | ICD-10-CM | POA: Diagnosis not present

## 2021-09-03 DIAGNOSIS — Z7984 Long term (current) use of oral hypoglycemic drugs: Secondary | ICD-10-CM | POA: Diagnosis not present

## 2021-09-03 NOTE — Progress Notes (Signed)
Pt here to re establish care for hx of DVT. ?

## 2021-09-03 NOTE — Progress Notes (Signed)
?Hematology/Oncology Progress note ?[phone] [phone] ?  ? ? ? ?Patient Care Team: ?[name], NP as PCP - General (Internal Medicine) ?Clent Jacks, RN as Registered Nurse ?Delles, Virl Diamond, RPH-CPP as Pharmacist ? ?REFERRING PROVIDER: ?[name], NP  ?CHIEF COMPLAINTS/REASON FOR VISIT:  ? ?History of DVT ?HISTORY OF PRESENTING ILLNESS:  ? ?Melissa Cabrera is a  66 y.o.  female with PMH listed below was seen in consultation at the request of  [name], NP  for evaluation of DVT ?Patient recently presented to primary care provider's office complaining about bilateral leg swelling.  Patient has chronic lower extremity edema,-she has a history of CHF ?Marland Kitchen  Patient noticed right side worse in the left side.  Feels tight.  Patient has chronic shortness of breath, which is worse after recent Covid 19 infection.  At that time she had fatigue, headache, body aches.  Patient also takes furosemide 40 mg daily. ?Denies any hemoptysis or chest pain. ?04/27/2019 venous Doppler ultrasound of right lower extremity showed acute right femoral-popliteal DVT and Baker's cyst.  Patient was started on anticoagulation with Xarelto.  Currently she is taking 15 mg twice daily.  Reports tolerating well.  She feels that her right lower extremity swelling has significantly improved.  Previously she had difficulties putting on her pants due to the right lower extremity swelling.  None she has no difficulties. ? ?Age-appropriate cancer screening, she is over due for colonoscopy. ? ? ?INTERVAL HISTORY ?Melissa Cabrera is a 66 y.o. female who has above history reviewed by me today presents for follow up visit for history of DVT. ?Patient was last seen by me on 04/30/2019, and then lost follow-up due to lack of insurance coverage. ?She was referred to reestablish care for discussion of duration and choices of anticoagulation. ?Patient denies any shortness of breath, lower extremity swelling.  She is  currently on Xarelto 20 mg daily. ? ? ?Review of Systems  ?Constitutional:  Negative for appetite change, chills, fatigue and fever.  ?HENT:   Negative for hearing loss and voice change.   ?Eyes:  Negative for eye problems.  ?Respiratory:  Negative for chest tightness and cough.   ?Cardiovascular:  Negative for chest pain and leg swelling.  ?Gastrointestinal:  Negative for abdominal distention, abdominal pain and blood in stool.  ?Endocrine: Negative for hot flashes.  ?Genitourinary:  Negative for difficulty urinating and frequency.   ?Musculoskeletal:  Negative for arthralgias.  ?Skin:  Negative for itching and rash.  ?Neurological:  Negative for extremity weakness.  ?Hematological:  Negative for adenopathy.  ?Psychiatric/Behavioral:  Negative for confusion.   ? ?MEDICAL HISTORY:  ?Past Medical History:  ?Diagnosis Date  ? Allergy   ? Arthritis   ? 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 Chicken pox   ? Diabetes mellitus without complication ([place])   ? GERD (gastroesophageal reflux disease)   ? Heart murmur   ? DUE TO RHEUMATIC FEVER PER PT  ? Hyperlipidemia   ? Hypertension   ? Psoriasis   ? Rheumatic fever   ? Rosacea   ? ? ?SURGICAL HISTORY: ?Past Surgical History:  ?Procedure Laterality Date  ? ABDOMINAL HYSTERECTOMY    ? ANTERIOR AND POSTERIOR REPAIR N/A 12/04/2016  ? Procedure: ANTERIOR (CYSTOCELE) AND POSTERIOR REPAIR (RECTOCELE), TRANSOBTURATOR SLING PLACEMENT(ARIS);  Surgeon: Harlin Heys, MD;  Location: [facility] ORS;  Service: Gynecology;  Laterality: N/A;  ? COLONOSCOPY WITH PROPOFOL N/A 05/15/2021  ? Procedure: COLONOSCOPY WITH PROPOFOL;  Surgeon: Lucilla Lame,  MD;  Location: [facility] ENDOSCOPY;  Service: Endoscopy;  Laterality: N/A;  ? DILATION AND CURETTAGE OF UTERUS N/A 09/16/2016  ? Procedure: DILATATION AND CURETTAGE;  Surgeon: Harlin Heys, MD;  Location: [facility] ORS;  Service: Gynecology;  Laterality: N/A;  ? LAPAROSCOPIC HYSTERECTOMY Bilateral 12/04/2016  ? Procedure: HYSTERECTOMY TOTAL LAPAROSCOPIC WITH  BILATERAL SALPINGO OOPHERECTOMY;  Surgeon: Harlin Heys, MD;  Location: [facility] ORS;  Service: Gynecology;  Laterality: Bilateral;  ? MOUTH SURGERY    ? ? ?SOCIAL HISTORY: ?Social History  ? ?Socioeconomic History  ? Marital status: Married  ?  Spouse name: Not on file  ? Number of children: Not on file  ? Years of education: Not on file  ? Highest education level: Not on file  ?Occupational History  ? Not on file  ?Tobacco Use  ? Smoking status: Former  ?  Packs/day: 4.00  ?  Years: 16.00  ?  Pack years: 64.00  ?  Types: Cigarettes  ?  Quit date: 09/12/1995  ?  Years since quitting: 25.9  ? Smokeless tobacco: Never  ? Tobacco comments:  ?  quit in 1997  ?Vaping Use  ? Vaping Use: Never used  ?Substance and Sexual Activity  ? Alcohol use: No  ?  Alcohol/week: 0.0 standard drinks  ? Drug use: No  ? Sexual activity: Yes  ?  Birth control/protection: None  ?Other Topics Concern  ? Not on file  ?Social History Narrative  ? Not on file  ? ?Social Determinants of Health  ? ?Financial Resource Strain: Not on file  ?Food Insecurity: Not on file  ?Transportation Needs: Not on file  ?Physical Activity: Not on file  ?Stress: Not on file  ?Social Connections: Not on file  ?Intimate Partner Violence: Not on file  ? ? ?FAMILY HISTORY: ?Family History  ?Problem Relation Age of Onset  ? Cancer Mother   ?     skin  ? Dementia Mother   ? Cancer Sister   ?     skin and liver  ? Stroke Maternal Grandmother   ? Skin cancer Brother   ? Diabetes Neg Hx   ? Heart disease Neg Hx   ? Breast cancer Neg Hx   ? ? ?ALLERGIES:  has No Known Allergies. ? ?MEDICATIONS:  ?Current Outpatient Medications  ?Medication Sig Dispense Refill  ? acetaminophen (TYLENOL) 650 MG CR tablet Take 1,300 mg by mouth daily.    ? cetirizine (ZYRTEC) 10 MG tablet Take 10 mg by mouth at bedtime.     ? Cholecalciferol 25 MCG (1000 UT) tablet Take 1,000 Units by mouth daily.    ? diphenhydrAMINE (BENADRYL) 25 mg capsule Take 50 mg by mouth at bedtime.     ? doxycycline  (ADOXA) 100 MG tablet Take 50 mg by mouth daily.    ? folic acid (FOLVITE) 1 MG tablet Take 1 mg by mouth daily.    ? furosemide (LASIX) 40 MG tablet Take 1 tablet (40 mg total) by mouth daily. 90 tablet 2  ? gabapentin (NEURONTIN) 300 MG capsule Take 2 capsules (600 mg total) by mouth 3 (three) times daily. 540 capsule 1  ? Golimumab ([place] ARIA IV) Inject into the vein. Reports receives via infusion from Rheumatology clinic    ? losartan (COZAAR) 50 MG tablet Take 1 tablet (50 mg total) by mouth daily. 90 tablet 1  ? metFORMIN (GLUCOPHAGE) 500 MG tablet TAKE 2 TABLETS EVERY       MORNING AND 1 TABLET EVERY EVENING  270 tablet 0  ? methotrexate (RHEUMATREX) 2.5 MG tablet Take 25 mg by mouth every Friday. Caution:Chemotherapy. Protect from light.    ? Omega-3 Fatty Acids (FISH OIL PO) Take 1 capsule by mouth in the morning. 1040 mg of fish oil in each capsule    ? omeprazole (PRILOSEC) 40 MG capsule TAKE 1 CAPSULE DAILY 90 capsule 0  ? predniSONE (DELTASONE) 5 MG tablet Take 5 mg by mouth daily.    ? rivaroxaban (XARELTO) 20 MG TABS tablet Take 1 tablet (20 mg total) by mouth daily with supper. 90 tablet 1  ? simvastatin (ZOCOR) 10 MG tablet Take 1 tablet (10 mg total) by mouth daily. 90 tablet 1  ? vitamin C (ASCORBIC ACID) 500 MG tablet Take 1,000 mg by mouth daily.     ? Zinc 25 MG TABS Take by mouth.    ? betamethasone dipropionate (DIPROLENE) 0.05 % ointment Apply 1 application topically 2 (two) times daily as needed. psoriasis (Patient not taking: Reported on 08/17/2021)    ? clotrimazole-betamethasone (LOTRISONE) cream APPLY TOPICALLY AS DIRECTED TWICE DAILY (Patient not taking: Reported on 08/17/2021) 60 g 5  ? fluocinonide ointment (LIDEX) 3.81 % Apply 1 application topically 2 (two) times daily as needed (PSORIASIS BEHIND EARS).  (Patient not taking: Reported on 08/17/2021)    ? nystatin ointment (MYCOSTATIN) Apply 1 application topically 2 (two) times daily. (Patient not taking: Reported on 09/03/2021)    ?  SOOLANTRA 1 % CREA APPLY TO THE FACE EVERY DAY (Patient not taking: Reported on 09/03/2021)  6  ? ?No current facility-administered medications for this visit.  ? ? ? ?PHYSICAL EXAMINATION: ?ECOG PERFORMANCE S

## 2021-09-04 ENCOUNTER — Telehealth: Payer: Self-pay

## 2021-09-04 NOTE — Telephone Encounter (Signed)
Spoke to patient who states that she does not want to go forth with referral. She states that she had a scan done about 10 years ago which showed her lung were good and she does not think she needs it. Pt advised that it would be a good idea to have scan but she denied referral.  ?

## 2021-09-04 NOTE — Telephone Encounter (Signed)
-----   Message from Earlie Server, MD sent at 09/03/2021  4:51 PM EDT ----- ?Please let patient know that given that she has significant history of smoking in the past, recommend her to consider lung cancer screening.  If okay with her, refer to lung cancer screening program thank you ? ?

## 2021-09-09 DIAGNOSIS — I10 Essential (primary) hypertension: Secondary | ICD-10-CM

## 2021-09-09 DIAGNOSIS — E0843 Diabetes 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 autonomic (poly)neuropathy: Secondary | ICD-10-CM | POA: Diagnosis not present

## 2021-09-10 ENCOUNTER — Other Ambulatory Visit: Payer: Self-pay

## 2021-09-10 ENCOUNTER — Ambulatory Visit (INDEPENDENT_AMBULATORY_CARE_PROVIDER_SITE_OTHER): Payer: Medicare Other | Admitting: Pharmacist

## 2021-09-10 DIAGNOSIS — E0843 Diabetes 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 autonomic (poly)neuropathy: Secondary | ICD-10-CM

## 2021-09-10 DIAGNOSIS — Z86718 Personal history of other venous thrombosis and embolism: Secondary | ICD-10-CM

## 2021-09-10 DIAGNOSIS — I10 Essential (primary) hypertension: Secondary | ICD-10-CM

## 2021-09-10 MED ORDER — METFORMIN HCL 500 MG PO TABS: ORAL_TABLET | ORAL | 0 refills | Status: DC

## 2021-09-10 MED ORDER — GABAPENTIN 300 MG PO CAPS: 600.0000 mg | ORAL_CAPSULE | Freq: Three times a day (TID) | ORAL | 1 refills | Status: DC

## 2021-09-10 MED ORDER — FUROSEMIDE 40 MG PO TABS: 40.0000 mg | ORAL_TABLET | Freq: Every day | ORAL | 2 refills | Status: DC

## 2021-09-10 MED ORDER — LOSARTAN POTASSIUM 50 MG PO TABS: 50.0000 mg | ORAL_TABLET | Freq: Every day | ORAL | 1 refills | Status: DC

## 2021-09-10 MED ORDER — SIMVASTATIN 10 MG PO TABS: 10.0000 mg | ORAL_TABLET | Freq: Every day | ORAL | 1 refills | Status: DC

## 2021-09-10 MED ORDER — OMEPRAZOLE 40 MG PO CPDR: 40.0000 mg | DELAYED_RELEASE_CAPSULE | Freq: Every day | ORAL | 1 refills | Status: DC

## 2021-09-10 NOTE — Patient Instructions (Signed)
Visit Information ? ?Thank you for taking time to visit with me today. Please don't hesitate to contact me if I can be of assistance to you before our next scheduled telephone appointment. ? ?Following are the goals we discussed today:  ? Goals Addressed   ? ?  ?  ?  ?  ? This Visit's Progress  ?  Pharmacy Goals     ?  Our goal A1c is less than 7%. This corresponds with fasting sugars less than 130 and 2 hour after meal sugars less than 180. Please keep a log when you check your blood sugar ? ?Our goal bad cholesterol, or LDL, is less than 70 . This is why it is important to continue taking your simvastatin. ? ?Please check your home blood pressure, keep a log of the results and bring this with you to your medical appointments. ? ?Feel free to call me with any questions or concerns. I look forward to our next call! ? ?Wallace Cullens, PharmD, BCACP, CPP ?Clinical Pharmacist ?[facility] ?[place] ?[phone] ?  ? ?  ? ? ? ?Our next appointment is by telephone on 11/14/2021 at 8:30 AM ? ?Please call the care guide team at [phone] if you need to cancel or reschedule your appointment.  ? ? ?Patient verbalizes understanding of instructions and care plan provided today and agrees to view in [place]. Active MyChart status confirmed with patient.   ? ?

## 2021-09-10 NOTE — Chronic Care Management (AMB) (Signed)
? ?Chronic Care Management ?CCM Pharmacy Note ? ?09/10/2021 ?Name:  Melissa Cabrera MRN:  [id number] DOB:  1955/05/16 ? ? ?Subjective: ?Melissa Cabrera is an 66 y.o. year old female who is a primary patient of [name], NP.  The CCM team was consulted for assistance with disease management and care coordination needs.   ? ?Engaged with patient by telephone for follow up visit for pharmacy case management and/or care coordination services.  ? ?Objective: ? ?Medications Reviewed Today   ? ? Reviewed by Evelina Dun, RN (Registered Nurse) on 09/03/21 at 50  Med List Status: <None>  ? ?Medication Order Taking? Sig Documenting Provider Last Dose Status Informant  ?acetaminophen (TYLENOL) 650 MG CR tablet [id number]  Take 1,300 mg by mouth daily. [provider]  Active   ?betamethasone dipropionate (DIPROLENE) 0.05 % ointment [id number]  Apply 1 application topically 2 (two) times daily as needed. psoriasis  ?Patient not taking: Reported on 08/17/2021  ? [provider]  Active Self  ?cetirizine (ZYRTEC) 10 MG tablet [id number]  Take 10 mg by mouth at bedtime.  [provider]  Active Self  ?Cholecalciferol 25 MCG (1000 UT) tablet [id number]  Take 1,000 Units by mouth daily. [provider]  Active   ?clotrimazole-betamethasone (LOTRISONE) cream [id number]  APPLY TOPICALLY AS DIRECTED TWICE DAILY  ?Patient not taking: Reported on 08/17/2021  ? [name], NP  Active Self  ?diphenhydrAMINE (BENADRYL) 25 mg capsule [id number]  Take 50 mg by mouth at bedtime.  [provider]  Active Self  ?doxycycline (ADOXA) 100 MG tablet [id number]  Take 50 mg by mouth daily. [provider]  Active   ?fluocinonide ointment (LIDEX) 0.05 % [id number]  Apply 1 application topically 2 (two) times daily as needed (PSORIASIS BEHIND EARS).   ?Patient not taking: Reported on 08/17/2021  ? [provider]  Active Self  ?folic acid (FOLVITE) 1 MG tablet [id number]  Take 1 mg by mouth  daily. [provider]  Active Self  ?         ?Med Note Winfield Cunas, Kiasha Bellin A   Fri Oct 06, 2020  9:32 AM) Reports takes 3 tablets daily on Friday & Saturday and 1 tablet daily Sunday-Thursday as directed by Rheumatology  ?furosemide (LASIX) 40 MG tablet [id number]  Take 1 tablet (40 mg total) by mouth daily. [name], NP  Active   ?gabapentin (NEURONTIN) 300 MG capsule [id number]  Take 2 capsules (600 mg total) by mouth 3 (three) times daily. [name], NP  Active   ?Golimumab ([place] ARIA IV) [id number]  Inject into the vein. Reports receives via infusion from Rheumatology clinic [provider]  Active   ?losartan (COZAAR) 50 MG tablet [id number]  Take 1 tablet (50 mg total) by mouth daily. [name], NP  Active   ?metFORMIN (GLUCOPHAGE) 500 MG tablet [id number]  TAKE 2 TABLETS EVERY       MORNING AND 1 TABLET EVERY EVENING Baity, Coralie Keens, NP  Active   ?methotrexate (RHEUMATREX) 2.5 MG tablet [id number]  Take 25 mg by mouth every Friday. Caution:Chemotherapy. Protect from light. [provider]  Active Self  ?         ?Med Note (BARLEY, JENNIFER   Thu Nov 21, 2016 11:23 AM) PT AWARE TO STOP PRIOR TO PROCEDURE   ?nystatin ointment (MYCOSTATIN) [id number]  Apply 1 application topically 2 (two) times daily. [provider]  Active   ?Omega-3 Fatty Acids (FISH OIL PO) [id number]  Take 1 capsule by mouth in the morning. 1040 mg of fish oil in each capsule [provider]  Active Self  ?omeprazole (PRILOSEC) 40 MG capsule [id number]  TAKE 1 CAPSULE DAILY Baity, Coralie Keens, NP  Active   ?predniSONE (DELTASONE) 5 MG tablet [id number]  Take 5 mg by mouth daily. [provider]  Active   ?rivaroxaban (XARELTO) 20 MG TABS tablet [id number]  Take 1 tablet (20 mg total) by mouth daily with supper. [name], NP  Active   ?simvastatin (ZOCOR) 10 MG tablet [id number]  Take 1 tablet (10 mg total) by mouth daily. [name], NP  Active   ?SOOLANTRA 1 % CREA  [id number]  APPLY TO THE [facility] DAY [provider]  Active   ?vitamin C (ASCORBIC ACID) 500 MG tablet [id number]  Take 1,000 mg by mouth daily.  [provider]  Active   ?Zinc 25 MG TABS [id number]  Take by mouth. [provider]  Active   ? ?  ?  ? ?  ? ? ?Pertinent Labs:  ?Lab Results  ?Component Value Date  ? HGBA1C 7.1 (H) 04/10/2021  ? ?Lab Results  ?Component Value Date  ? CHOL 161 04/10/2021  ? HDL 55 04/10/2021  ? [place] 74 04/10/2021  ? TRIG 233 (H) 04/10/2021  ? CHOLHDL 2.9 04/10/2021  ? ?Lab Results  ?Component Value Date  ? CREATININE 0.93 04/10/2021  ? BUN 14 04/10/2021  ? NA 140 04/10/2021  ? K 4.1 04/10/2021  ? CL 102 04/10/2021  ? CO2 29 04/10/2021  ? ?BP Readings from Last 3 Encounters:  ?09/03/21 140/69  ?05/15/21 106/74  ?04/10/21 126/68  ? ?Pulse Readings from Last 3 Encounters:  ?09/03/21 70  ?05/15/21 83  ?04/10/21 87  ? ? ? ?SDOH:  (Social Determinants of Health) assessments and interventions performed:  ? ? ?CCM Care Plan ? ?Review of patient past medical history, allergies, medications, health status, including review of consultants reports, laboratory and other test data, was performed as part of comprehensive evaluation and provision of chronic care management services.  ? ?Care Plan : PharmD - Medication Management/Assistance  ?Updates made by Rennis Petty, RPH-CPP since 09/10/2021 12:00 AM  ?  ? ?Problem: Disease Progression   ?  ? ?Long-Range Goal: Disease Progression Prevented or Minimized   ?Start Date: 09/29/2020  ?Expected End Date: 12/28/2020  ?This Visit's Progress: On track  ?Recent Progress: On track  ?Priority: High  ?Note:   ?Current Barriers:  ?Unable to independently afford treatment regimen ?Reports medication costs now affordable as approved for Full Extra Help subsidy as of 08/2021 ?Fear of injections ? ?Pharmacist Clinical Goal(s):  ?Over the next 90 days, patient will achieve control of blood sugar control as evidenced by A1C through  collaboration with PharmD and provider.  ? ?Interventions: ?1:1 collaboration with [name], NP regarding development and update of comprehensive plan of care as evidenced by provider attestation and co-signature ?Inter-disciplinary care team collaboration (see longitudinal plan of care) ?Perform chart review. Patient seen for Office Visit with [facility] on 09/03/2021 for follow up of history of DVT. Provider advised patient:  ?Recommend long-term anticoagulation with Xarelto 10 mg daily ?Recommend lung cancer screening program ?Note patient's husband helps manage her medications and fills weekly pillbox for patient ? ?Medication Assistance/History of DVT: ?Today reports cost of Xarelto now affordable as approved for Full Extra Help subsidy ?Notes since approved for Extra Help, switched coverage to [facility]  Rx Plan ([place]) Medicare, effective 09/10/2021, and plans to use [place] ?Patient requests PCP send Rx for 90 day supply of Xarelto Rx (dosing now Xarelto 10 mg daily per Dr. Tasia Catchings) to [place] for patient ?Will collaborate with PCP ? ?T2DM: ?Current treatment: ?Metformin 500 mg - 2 tablets (1000 mg) with breakfast and 1 tablet (500 mg) with supper  ?Denies missed doses ?Note patient on prednisone 5 mg daily from Rheumatology ?Reports Rheumatologist planning to try to decrease/stop if doing well on Simponi infusion (started on Wednesday) ?Reports recent home fasting blood sugar readings: ? Morning Fasting Blood Sugar  ?25 - April 159  ?26 - April 176  ?27 - April 131  ?28 - April 113  ?29 - April -  ?30 - April 136  ?1 - May 123  ?Attributes variation in morning blood sugar readings to nights when she has cookies ~10 pm ?Counsel on planning out evening snack to limit carbohydrate portion sizes ?Discuss importance of having well-balanced meals throughout the day while limiting carbohydrate portion sizes ?Exercise: Reports has not been exercising  recently ?Reports had not yet, but planning to get back to using pedaling exerciser machine on both arms and legs ?Reports her weight has been stable recently, latest home weight ~226 lbs ?Encourage patient to m

## 2021-09-13 ENCOUNTER — Encounter: Payer: Self-pay | Admitting: Internal Medicine

## 2021-09-13 ENCOUNTER — Ambulatory Visit
Admission: RE | Admit: 2021-09-13 | Discharge: 2021-09-13 | Disposition: A | Discharge status: Home / Self Care | Payer: Medicare Other | Source: Ambulatory Visit | Attending: Internal Medicine | Admitting: Internal Medicine

## 2021-09-13 ENCOUNTER — Other Ambulatory Visit: Payer: Self-pay | Admitting: Internal Medicine

## 2021-09-13 DIAGNOSIS — Z1231 Encounter for screening mammogram for malignant neoplasm of breast: Secondary | ICD-10-CM | POA: Diagnosis not present

## 2021-09-13 MED ORDER — RIVAROXABAN 10 MG PO TABS: 10.0000 mg | ORAL_TABLET | Freq: Every day | ORAL | 1 refills | Status: DC

## 2021-09-14 NOTE — Telephone Encounter (Signed)
Duplicate message... See refill note.  ? ?Thanks,  ? ?-Mickel Baas  ?

## 2021-10-10 ENCOUNTER — Ambulatory Visit (INDEPENDENT_AMBULATORY_CARE_PROVIDER_SITE_OTHER): Payer: Medicare Other | Admitting: Internal Medicine

## 2021-10-10 ENCOUNTER — Encounter: Payer: Self-pay | Admitting: Internal Medicine

## 2021-10-10 VITALS — BP 116/78 | HR 72 | Temp 96.9°F | Wt 234.0 lb

## 2021-10-10 DIAGNOSIS — I5032 Chronic diastolic (congestive) heart failure: Secondary | ICD-10-CM

## 2021-10-10 DIAGNOSIS — E0843 Diabetes 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 autonomic (poly)neuropathy: Secondary | ICD-10-CM

## 2021-10-10 DIAGNOSIS — L4050 Arthropathic psoriasis, unspecified: Secondary | ICD-10-CM

## 2021-10-10 DIAGNOSIS — E1140 Type 2 diabetes mellitus with diabetic neuropathy, unspecified: Secondary | ICD-10-CM | POA: Insufficient documentation

## 2021-10-10 DIAGNOSIS — D692 Other nonthrombocytopenic purpura: Secondary | ICD-10-CM | POA: Insufficient documentation

## 2021-10-10 DIAGNOSIS — E1143 Type 2 diabetes mellitus with diabetic autonomic (poly)neuropathy: Secondary | ICD-10-CM

## 2021-10-10 DIAGNOSIS — Z86718 Personal history of other venous thrombosis and embolism: Secondary | ICD-10-CM | POA: Diagnosis not present

## 2021-10-10 DIAGNOSIS — K219 Gastro-esophageal reflux disease without esophagitis: Secondary | ICD-10-CM

## 2021-10-10 DIAGNOSIS — I10 Essential (primary) hypertension: Secondary | ICD-10-CM

## 2021-10-10 DIAGNOSIS — E7800 Pure hypercholesterolemia, unspecified: Secondary | ICD-10-CM

## 2021-10-10 DIAGNOSIS — L719 Rosacea, unspecified: Secondary | ICD-10-CM

## 2021-10-10 LAB — POCT GLYCOSYLATED HEMOGLOBIN (HGB A1C): Hemoglobin A1C: 7.1 % — AB (ref 4.0–5.6)

## 2021-10-10 MED ORDER — OMEPRAZOLE 20 MG PO CPDR: 20.0000 mg | DELAYED_RELEASE_CAPSULE | Freq: Every day | ORAL | 3 refills | Status: DC

## 2021-10-10 NOTE — Assessment & Plan Note (Signed)
C-Met and lipid profile today Encouraged her to consume a low-fat diet Continue Simvastatin 

## 2021-10-10 NOTE — Assessment & Plan Note (Signed)
Continue Methotrexate, Gabapentin, Prednisone and Simponi per rheumatology

## 2021-10-10 NOTE — Assessment & Plan Note (Signed)
Controlled on Losartan and Furosemide Reinforced DASH diet and exercise for weight loss We will monitor

## 2021-10-10 NOTE — Assessment & Plan Note (Signed)
Compensated Continue Losartan and Furosemide Reinforced DASH diet Monitor weights

## 2021-10-10 NOTE — Assessment & Plan Note (Signed)
Encourage diet and exercise for weight loss 

## 2021-10-10 NOTE — Assessment & Plan Note (Signed)
Continue Gabapentin

## 2021-10-10 NOTE — Assessment & Plan Note (Signed)
CBC today We will monitor

## 2021-10-10 NOTE — Progress Notes (Signed)
Subjective:    Patient ID: Melissa Cabrera, female    DOB: Apr 30, 1956, 66 y.o.   MRN: [id number]  HPI  Patient presents to clinic today for follow-up of chronic conditions.  CHF, Diastolic: She reports intermittent lower extremity edema and shortness of breath.  She is taking Losartan and Furosemide as prescribed.  Echo from 07/2017 reviewed.  She no longer follows with cardiology.  HTN: Her BP today is 116/78.  She is taking Losartan and Furosemide as prescribed.  ECG from 11/2018 reviewed.  GERD: She is not sure what triggers this. She denies breakthrough on Omeprazole.  There is no upper GI on file.  HLD: Her last LDL was 74, triglycerides 233, 03/2019.  She denies myalgias on Simvastatin.  She does not consume a low-fat diet.  Psoriatic Arthritis: Managed with Methotrexate, Gabapentin, Prednisone and Simponi.  She follows with rheumatology.  DM2 with Peripheral Neuropathy: Her last A1c was 7.1%, 03/2021.  She is taking Glipizide as prescribed.  She takes Gabapentin for neuropathic pain.  Her sugars are typically <150.  She checks her feet routinely.  Her last eye exam was more than 2 years ago.  Flu never.  Pneumovax never.  COVID never.  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  She does not follow with hematology.  Rosacea: Managed with Doxycycline.  Review of Systems     Past Medical History:  Diagnosis Date   Allergy    Arthritis    Cancer ([place])    endometrial   Chicken pox    Diabetes mellitus without complication (HCC)    GERD (gastroesophageal reflux disease)    Heart murmur    DUE TO RHEUMATIC FEVER PER PT   Hyperlipidemia    Hypertension    Psoriasis    Rheumatic fever    Rosacea     Current Outpatient Medications  Medication Sig Dispense Refill   acetaminophen (TYLENOL) 650 MG CR tablet Take 1,300 mg by mouth daily.     cetirizine (ZYRTEC) 10 MG tablet Take 10 mg by mouth at bedtime.      Cholecalciferol 25 MCG (1000 UT) tablet Take 1,000 Units by  mouth daily.     diphenhydrAMINE (BENADRYL) 25 mg capsule Take 50 mg by mouth at bedtime.      doxycycline (ADOXA) 100 MG tablet Take 50 mg by mouth daily.     folic acid (FOLVITE) 1 MG tablet Take 1 mg by mouth daily.     furosemide (LASIX) 40 MG tablet Take 1 tablet (40 mg total) by mouth daily. 90 tablet 2   gabapentin (NEURONTIN) 300 MG capsule Take 2 capsules (600 mg total) by mouth 3 (three) times daily. 540 capsule 1   Golimumab ([place] ARIA IV) Inject into the vein. Reports receives via infusion from Rheumatology clinic     losartan (COZAAR) 50 MG tablet Take 1 tablet (50 mg total) by mouth daily. 90 tablet 1   metFORMIN (GLUCOPHAGE) 500 MG tablet TAKE 2 TABLETS EVERY       MORNING AND 1 TABLET EVERY EVENING 270 tablet 0   methotrexate (RHEUMATREX) 2.5 MG tablet Take 25 mg by mouth every Friday. Caution:Chemotherapy. Protect from light.     Omega-3 Fatty Acids (FISH OIL PO) Take 1 capsule by mouth in the morning. 1040 mg of fish oil in each capsule     omeprazole (PRILOSEC) 40 MG capsule Take 1 capsule (40 mg total) by mouth daily. 90 capsule 1   predniSONE (DELTASONE) 5 MG tablet Take 5 mg  by mouth daily.     rivaroxaban (XARELTO) 10 MG TABS tablet Take 1 tablet (10 mg total) by mouth daily. 90 tablet 1   simvastatin (ZOCOR) 10 MG tablet Take 1 tablet (10 mg total) by mouth daily. 90 tablet 1   vitamin C (ASCORBIC ACID) 500 MG tablet Take 1,000 mg by mouth daily.      Zinc 25 MG TABS Take by mouth.     No current facility-administered medications for this visit.    No Known Allergies  Family History  Problem Relation Age of Onset   Cancer Mother        skin   Dementia Mother    Cancer Sister        skin and liver   Stroke Maternal Grandmother    Skin cancer Brother    Diabetes Neg Hx    Heart disease Neg Hx    Breast cancer Neg Hx     Social History   Socioeconomic History   Marital status: Married    Spouse name: Not on file   Number of children: Not on file    Years of education: Not on file   Highest education level: Not on file  Occupational History   Not on file  Tobacco Use   Smoking status: Former    Packs/day: 4.00    Years: 16.00    Pack years: 64.00    Types: Cigarettes    Quit date: 09/12/1995    Years since quitting: 26.0   Smokeless tobacco: Never   Tobacco comments:    quit in 1997  Vaping Use   Vaping Use: Never used  Substance and Sexual Activity   Alcohol use: No    Alcohol/week: 0.0 standard drinks   Drug use: No   Sexual activity: Yes    Birth control/protection: None  Other Topics Concern   Not on file  Social History Narrative   Not on file   Social Determinants of Health   Financial Resource Strain: Not on file  Food Insecurity: Not on file  Transportation Needs: Not on file  Physical Activity: Not on file  Stress: Not on file  Social Connections: Not on file  Intimate Partner Violence: Not on file     Constitutional: Denies fever, malaise, fatigue, headache or abrupt weight changes.  HEENT: Denies eye pain, eye redness, ear pain, ringing in the ears, wax buildup, runny nose, nasal congestion, bloody nose, or sore throat. Respiratory: Patient reports intermittent shortness of breath.  Denies difficulty breathing, cough or sputum production.   Cardiovascular: Patient reports lower extremity edema.  Denies chest pain, chest tightness, palpitations or swelling in the hands.  Gastrointestinal: Denies abdominal pain, bloating, constipation, diarrhea or blood in the stool.  GU: Denies urgency, frequency, pain with urination, burning sensation, blood in urine, odor or discharge. Musculoskeletal: Patient reports intermittent joint pain.  Denies decrease in range of motion, difficulty with gait, muscle pain or joint swelling.  Skin: Denies redness, rashes, lesions or ulcercations.  Neurological: Patient reports neuropathic pain.  Denies dizziness, difficulty with memory, difficulty with speech or problems with  balance and coordination.  Psych: Denies anxiety, depression, SI/HI.  No other specific complaints in a complete review of systems (except as listed in HPI above).  Objective:   Physical Exam BP 116/78 (BP Location: Left Arm, Patient Position: Sitting, Cuff Size: Large)   Pulse 72   Temp (!) 96.9 F (36.1 C) (Temporal)   Wt 234 lb (106.1 kg)   SpO2 97%  BMI 45.70 kg/m   Wt Readings from Last 3 Encounters:  09/03/21 232 lb 4.8 oz (105.4 kg)  05/15/21 219 lb (99.3 kg)  04/10/21 224 lb (101.6 kg)    General: Appears her stated age, obese, in NAD. Skin: Senile purpura noted. Warm, dry and intact. No ulcerations noted. HEENT: Head: normal shape and size; Eyes: PERRLA and EOMs intact;  Neck:  Neck supple, trachea midline. No masses, lumps or thyromegaly present.  Cardiovascular: Normal rate and rhythm. S1,S2 noted.  No murmur, rubs or gallops noted. No JVD or BLE edema. No carotid bruits noted. Pulmonary/Chest: Normal effort and positive vesicular breath sounds. No respiratory distress. No wheezes, rales or ronchi noted.  Musculoskeletal: Strength 5/5 BUE/BLE.  No joint swelling noted.  No difficulty with gait.  Neurological: Alert and oriented. Cranial nerves II-XII grossly intact. Coordination normal.  Psychiatric: Mood and affect normal. Behavior is normal. Judgment and thought content normal.     BMET    Component Value Date/Time   NA 140 04/10/2021 1008   NA 143 01/07/2014 0000   K 4.1 04/10/2021 1008   CL 102 04/10/2021 1008   CO2 29 04/10/2021 1008   GLUCOSE 138 (H) 04/10/2021 1008   BUN 14 04/10/2021 1008   CREATININE 0.93 04/10/2021 1008   CALCIUM 9.8 04/10/2021 1008   GFRNONAA >60 04/30/2019 1203   GFRAA >60 04/30/2019 1203    Lipid Panel     Component Value Date/Time   CHOL 161 04/10/2021 1008   TRIG 233 (H) 04/10/2021 1008   HDL 55 04/10/2021 1008   CHOLHDL 2.9 04/10/2021 1008   VLDL 39.0 12/21/2019 1149   LDLCALC 74 04/10/2021 1008    CBC     Component Value Date/Time   WBC 7.9 04/10/2021 1008   RBC 4.41 04/10/2021 1008   HGB 13.3 04/10/2021 1008   HGB 14.5 08/16/2016 1200   HCT 40.7 04/10/2021 1008   HCT 42.8 08/16/2016 1200   PLT 356 04/10/2021 1008   PLT 297 08/16/2016 1200   MCV 92.3 04/10/2021 1008   MCV 86 08/16/2016 1200   MCH 30.2 04/10/2021 1008   MCHC 32.7 04/10/2021 1008   RDW 14.1 04/10/2021 1008   RDW 13.5 08/16/2016 1200   LYMPHSABS 2.1 04/30/2019 1203   MONOABS 0.6 04/30/2019 1203   EOSABS 0.2 04/30/2019 1203   BASOSABS 0.0 04/30/2019 1203    Hgb A1C Lab Results  Component Value Date   HGBA1C 7.1 (H) 04/10/2021           Assessment & Plan:    Webb Silversmith, NP

## 2021-10-10 NOTE — Assessment & Plan Note (Signed)
Managed with Xarelto

## 2021-10-10 NOTE — Assessment & Plan Note (Signed)
POCT A1c 7.1% We will check urine microalbumin today Encouraged her to consume a low-carb diet and exercise for weight loss Continue Glipizide Referral to ophthalmology placed for diabetic eye exam Encourage routine foot exam She declines immunizations

## 2021-10-10 NOTE — Assessment & Plan Note (Signed)
Managed with Doxycycline

## 2021-10-10 NOTE — Assessment & Plan Note (Signed)
Encourage weight loss as this can help reduce reflux symptoms We will try to decrease Omeprazole to 20 mg daily

## 2021-10-10 NOTE — Patient Instructions (Signed)

## 2021-10-11 NOTE — Telephone Encounter (Signed)
See pt mychart message

## 2021-10-15 ENCOUNTER — Encounter: Payer: Self-pay | Admitting: Internal Medicine

## 2021-10-16 ENCOUNTER — Encounter: Payer: Self-pay | Admitting: Internal Medicine

## 2021-10-16 MED ORDER — MICROLET LANCETS MISC: 1 refills | Status: DC

## 2021-10-16 MED ORDER — CONTOUR NEXT TEST VI STRP: ORAL_STRIP | 1 refills | Status: DC

## 2021-10-16 MED ORDER — OMEPRAZOLE 20 MG PO CPDR: 20.0000 mg | DELAYED_RELEASE_CAPSULE | Freq: Every day | ORAL | 1 refills | Status: DC

## 2021-10-17 ENCOUNTER — Other Ambulatory Visit: Payer: Self-pay | Admitting: Internal Medicine

## 2021-10-17 MED ORDER — MICROLET LANCETS MISC: 1 refills | Status: DC

## 2021-10-21 LAB — COMPREHENSIVE METABOLIC PANEL
ALT: 17 IU/L (ref 0–32)
AST: 14 IU/L (ref 0–40)
Albumin/Globulin Ratio: 2.3 — ABNORMAL HIGH (ref 1.2–2.2)
Albumin: 4.3 g/dL (ref 3.8–4.8)
Alkaline Phosphatase: 85 IU/L (ref 44–121)
BUN/Creatinine Ratio: 14 (ref 12–28)
BUN: 14 mg/dL (ref 8–27)
Bilirubin Total: 0.3 mg/dL (ref 0.0–1.2)
CO2: 25 mmol/L (ref 20–29)
Calcium: 9.6 mg/dL (ref 8.7–10.3)
Chloride: 101 mmol/L (ref 96–106)
Creatinine, Ser: 0.97 mg/dL (ref 0.57–1.00)
Globulin, Total: 1.9 g/dL (ref 1.5–4.5)
Glucose: 187 mg/dL — ABNORMAL HIGH (ref 70–99)
Potassium: 4.5 mmol/L (ref 3.5–5.2)
Sodium: 141 mmol/L (ref 134–144)
Total Protein: 6.2 g/dL (ref 6.0–8.5)
eGFR: 65 mL/min/{1.73_m2} (ref >59)

## 2021-10-21 LAB — LIPID PANEL
Chol/HDL Ratio: 3.2 ratio (ref 0.0–4.4)
Cholesterol, Total: 186 mg/dL (ref 100–199)
HDL: 58 mg/dL (ref >39)
LDL Chol Calc (NIH): 90 mg/dL (ref 0–99)
Triglycerides: 228 mg/dL — ABNORMAL HIGH (ref 0–149)
VLDL Cholesterol Cal: 38 mg/dL (ref 5–40)

## 2021-10-21 LAB — MICROALBUMIN / CREATININE URINE RATIO
Creatinine, Urine: 16.5 mg/dL
Microalb/Creat Ratio: <18 mg/g creat (ref 0–29)
Microalbumin, Urine: <3 ug/mL

## 2021-10-21 LAB — CBC
Hematocrit: 39.4 % (ref 34.0–46.6)
Hemoglobin: 13.4 g/dL (ref 11.1–15.9)
MCH: 30.9 pg (ref 26.6–33.0)
MCHC: 34 g/dL (ref 31.5–35.7)
MCV: 91 fL (ref 79–97)
Platelets: 297 10*3/uL (ref 150–450)
RBC: 4.33 x10E6/uL (ref 3.77–5.28)
RDW: 14.4 % (ref 11.7–15.4)
WBC: 5.5 10*3/uL (ref 3.4–10.8)

## 2021-10-22 ENCOUNTER — Other Ambulatory Visit: Payer: Self-pay

## 2021-10-22 MED ORDER — EZETIMIBE 10 MG PO TABS: 10.0000 mg | ORAL_TABLET | Freq: Every day | ORAL | 1 refills | Status: DC

## 2021-10-22 NOTE — Telephone Encounter (Signed)
-----   Message from [name], NP sent at 10/22/2021  8:23 AM EDT ----- Cholesterol looks good but triglycerides are still high. Would she be willing to add Zetia to her medication regimen to help lower triglycerides? Liver and kidney function are normal. Blood counts normal. She should consume a low fat, low carb diet and exercise for weight loss.

## 2021-10-22 NOTE — Telephone Encounter (Signed)
Pt advised.  She agreed to start Zetia.  Please send to [facility].   Thanks,   -Mickel Baas

## 2021-11-07 ENCOUNTER — Telehealth: Payer: Medicare Other

## 2021-11-09 LAB — HM DIABETES EYE EXAM

## 2021-11-12 ENCOUNTER — Ambulatory Visit (INDEPENDENT_AMBULATORY_CARE_PROVIDER_SITE_OTHER): Payer: Medicare Other | Admitting: Pharmacist

## 2021-11-12 DIAGNOSIS — E7800 Pure hypercholesterolemia, unspecified: Secondary | ICD-10-CM

## 2021-11-12 DIAGNOSIS — E0843 Diabetes 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 autonomic (poly)neuropathy: Secondary | ICD-10-CM

## 2021-11-12 DIAGNOSIS — I10 Essential (primary) hypertension: Secondary | ICD-10-CM

## 2021-11-12 NOTE — Patient Instructions (Signed)
Visit Information  Thank you for taking time to visit with me today. Please don't hesitate to contact me if I can be of assistance to you before our next scheduled telephone appointment.  Following are the goals we discussed today:   Goals Addressed             This Visit's Progress    Pharmacy Goals       Our goal A1c is less than 7%. This corresponds with fasting sugars less than 130 and 2 hour after meal sugars less than 180. Please keep a log when you check your blood sugar  Our goal bad cholesterol, or LDL, is less than 70 . This is why it is important to continue taking your simvastatin and ezetimibe.  Please check your home blood pressure, keep a log of the results and bring this with you to your medical appointments.  Feel free to call me with any questions or concerns. I look forward to our next call!   Wallace Cullens, PharmD, [place] [phone]         Our next appointment is by telephone on 05/22/2022 at 8:30 AM  Please call the care guide team at [phone] if you need to cancel or reschedule your appointment.    Patient verbalizes understanding of instructions and care plan provided today and agrees to view in [place]. Active MyChart status and patient understanding of how to access instructions and care plan via MyChart confirmed with patient.

## 2021-11-12 NOTE — Chronic Care Management (AMB) (Signed)
Chronic Care Management CCM Pharmacy Note  11/12/2021 Name:  Melissa Cabrera MRN:  [id number] DOB:  1955-12-19   Subjective: Melissa Cabrera is an 66 y.o. year old female who is a primary patient of [name], NP.  The CCM team was consulted for assistance with disease management and care coordination needs.    Engaged with patient by telephone for follow up visit for pharmacy case management and/or care coordination services.   Objective:  Medications Reviewed Today     Reviewed by Rennis Petty, RPH-CPP (Pharmacist) on 11/12/21 at [facility] Status: <None>   Medication Order Taking? Sig Documenting Provider Last Dose Status Informant  acetaminophen (TYLENOL) 650 MG CR tablet [id number]  Take 1,300 mg by mouth daily. [provider]  Active   cetirizine (ZYRTEC) 10 MG tablet [id number]  Take 10 mg by mouth at bedtime.  [provider]  Active Self  Cholecalciferol 25 MCG (1000 UT) tablet [id number]  Take 1,000 Units by mouth daily. [provider]  Active   diphenhydrAMINE (BENADRYL) 25 mg capsule [id number]  Take 50 mg by mouth at bedtime.  [provider]  Active Self  doxycycline (ADOXA) 100 MG tablet [id number]  Take 50 mg by mouth daily. [provider]  Active   ezetimibe (ZETIA) 10 MG tablet [id number] Yes Take 1 tablet (10 mg total) by mouth daily. [name], NP Taking Active   folic acid (FOLVITE) 1 MG tablet [id number]  Take 1 mg by mouth daily. [provider]  Active Self           Med Note Winfield Cunas, Roshonda Sperl A   Fri Oct 06, 2020  9:32 AM) Reports takes 3 tablets daily on Friday & Saturday and 1 tablet daily Sunday-Thursday as directed by Rheumatology  furosemide (LASIX) 40 MG tablet [id number]  Take 1 tablet (40 mg total) by mouth daily. [name], NP  Active   gabapentin (NEURONTIN) 300 MG capsule [id number]  Take 2 capsules (600 mg total) by mouth 3 (three) times daily. [name], NP  Active    glucose blood (CONTOUR NEXT TEST) test strip [id number]  USE TO CHECK GLUCOSE ONCE DAILY  DX: E11.9 [name], NP  Active   Golimumab [facility] ARIA IV) [id number]  Inject into the vein. Reports receives via infusion from Rheumatology clinic [provider]  Active   losartan (COZAAR) 50 MG tablet [id number]  Take 1 tablet (50 mg total) by mouth daily. [name], NP  Active   metFORMIN (GLUCOPHAGE) 500 MG tablet [id number] Yes TAKE 2 TABLETS EVERY       MORNING AND 1 TABLET EVERY EVENING Baity, Coralie Keens, NP Taking Active   methotrexate (RHEUMATREX) 2.5 MG tablet [id number]  Take 25 mg by mouth every Friday. Caution:Chemotherapy. Protect from light. [provider]  Active Self           Med Note Channing Mutters, JENNIFER   Thu Nov 21, 2016 11:23 AM) PT AWARE TO STOP PRIOR TO PROCEDURE   Microlet Lancets MISC [id number]  Use to check Blood sugar Once a day.  EX: E11.9 [name], NP  Active   Omega-3 Fatty Acids (FISH OIL PO) [id number]  Take 1 capsule by mouth in the morning. 1040 mg of fish oil in each capsule [provider]  Active Self  omeprazole (PRILOSEC) 20 MG capsule [id number] Yes Take 1 capsule (20 mg total) by mouth daily. [name], NP Taking Active  predniSONE (DELTASONE) 5 MG tablet [id number] Yes Take 5 mg by mouth daily. [provider] Taking Active   rivaroxaban (XARELTO) 10 MG TABS tablet [id number] Yes Take 1 tablet (10 mg total) by mouth daily. [name], NP Taking Active   simvastatin (ZOCOR) 10 MG tablet [id number] Yes Take 1 tablet (10 mg total) by mouth daily. [name], NP Taking Active   vitamin C (ASCORBIC ACID) 500 MG tablet [id number]  Take 1,000 mg by mouth daily.  [provider]  Active   Zinc 25 MG TABS [id number]  Take by mouth. [provider]  Active             Pertinent Labs:  Lab Results  Component Value Date   HGBA1C 7.1 (A) 10/10/2021   Lab Results  Component Value Date   CHOL  186 10/19/2021   HDL 58 10/19/2021   LDLCALC 90 10/19/2021   LDLDIRECT 142.0 05/16/2014   TRIG 228 (H) 10/19/2021   CHOLHDL 3.2 10/19/2021   Lab Results  Component Value Date   CREATININE 0.97 10/19/2021   BUN 14 10/19/2021   NA 141 10/19/2021   K 4.5 10/19/2021   CL 101 10/19/2021   CO2 25 10/19/2021   BP Readings from Last 3 Encounters:  10/10/21 116/78  09/03/21 140/69  05/15/21 106/74   Pulse Readings from Last 3 Encounters:  10/10/21 72  09/03/21 70  05/15/21 83     SDOH:  (Social Determinants of Health) assessments and interventions performed:    [place]  Review of patient past medical history, allergies, medications, health status, including review of consultants reports, laboratory and other test data, was performed as part of comprehensive evaluation and provision of chronic care management services.   Care Plan : PharmD - Medication Management/Assistance  Updates made by Rennis Petty, RPH-CPP since 11/12/2021 12:00 AM     Problem: Disease Progression      Long-Range Goal: Disease Progression Prevented or Minimized   Start Date: 09/29/2020  Expected End Date: 12/28/2020  Recent Progress: On track  Priority: High  Note:   Current Barriers:  Unable to independently afford treatment regimen Reports medication costs now affordable as approved for [employer] subsidy as of 08/2021 Fear of injections  Pharmacist Clinical Goal(s):  Over the next 90 days, patient will achieve control of blood sugar control as evidenced by A1C through collaboration with PharmD and provider.   Interventions: 1:1 collaboration with [name], NP regarding development and update of comprehensive plan of care as evidenced by provider attestation and co-signature Inter-disciplinary care team collaboration (see longitudinal plan of care) Perform chart review. Patient seen for Office Visit with PCP on 10/10/2021 Note patient's husband helps manage her medications  and fills weekly pillbox for patient  T2DM: Current treatment: Metformin 500 mg - 2 tablets (1000 mg) with breakfast and 1 tablet (500 mg) with supper  Denies missed doses Note patient on prednisone 5 mg daily from Rheumatology Recalls recent home fasting blood sugar readings ranging:115-132 (up to 150s when eats something "I shouldn't the night before") Discuss importance of having well-balanced meals throughout the day while limiting carbohydrate portion sizes Encourage patient to monitor home blood sugar, using results as feed back for dietary choices, keeping log of results and to have this record to review during future medical appointments  Hyperlipidemia: Current treatment: Simvastatin 10 mg QHS Ezetimibe 10 mg daily (has started as prescribed by PCP on 10/22/2021) Omega-3 supplement daily Have discussed  impact of exercise on lipid-control  Hypertension: Losartan 50 mg daily Furosemide 40 mg daily Reports recalls last checked blood pressure on 6/28 was ~116/66 Denies symptoms of hypotension Encourage patient to continue to monitor home blood pressure, keep log of the results and have this record to review during future medical appointments   Patient Goals/Self-Care Activities Over the next 90 days, patient will:  - take medications as prescribed as evidenced by patient report and record review - check glucose, document, and provide at future appointments  - attend medical appointments as scheduled  Follow Up Plan: Telephone follow up appointment with care management team member scheduled for: 05/22/2022 at 8:30 AM       Wallace Cullens, PharmD, [place] [phone]

## 2021-11-14 ENCOUNTER — Telehealth: Payer: Medicare Other

## 2021-11-19 ENCOUNTER — Other Ambulatory Visit: Payer: Self-pay | Admitting: Internal Medicine

## 2021-11-20 NOTE — Telephone Encounter (Signed)
Requested Prescriptions  Pending Prescriptions Disp Refills  . metFORMIN (GLUCOPHAGE) 500 MG tablet [Pharmacy Med Name: METFORMIN HYDROCHLORIDE 500 MG Tablet] 270 tablet 0    Sig: TAKE 2 TABLETS EVERY MORNING AND TAKE 1 TABLET EVERY EVENING     Endocrinology:  Diabetes - Biguanides Failed - 11/19/2021 12:52 PM      Failed - B12 Level in normal range and within 720 days    No results found for: "VITAMINB12"       Failed - CBC within normal limits and completed in the last 12 months    WBC  Date Value Ref Range Status  10/19/2021 5.5 3.4 - 10.8 x10E3/uL Final  04/10/2021 7.9 3.8 - 10.8 Thousand/uL Final   RBC  Date Value Ref Range Status  10/19/2021 4.33 3.77 - 5.28 x10E6/uL Final  04/10/2021 4.41 3.80 - 5.10 Million/uL Final   Hemoglobin  Date Value Ref Range Status  10/19/2021 13.4 11.1 - 15.9 g/dL Final   Hematocrit  Date Value Ref Range Status  10/19/2021 39.4 34.0 - 46.6 % Final   MCHC  Date Value Ref Range Status  10/19/2021 34.0 31.5 - 35.7 g/dL Final  04/10/2021 32.7 32.0 - 36.0 g/dL Final   [facility]  Date Value Ref Range Status  10/19/2021 30.9 26.6 - 33.0 pg Final  04/10/2021 30.2 27.0 - 33.0 pg Final   MCV  Date Value Ref Range Status  10/19/2021 91 79 - 97 fL Final   No results found for: "PLTCOUNTKUC", "LABPLAT", "POCPLA" RDW  Date Value Ref Range Status  10/19/2021 14.4 11.7 - 15.4 % Final         Passed - Cr in normal range and within 360 days    Creat  Date Value Ref Range Status  04/10/2021 0.93 0.50 - 1.05 mg/dL Final   Creatinine, Ser  Date Value Ref Range Status  10/19/2021 0.97 0.57 - 1.00 mg/dL Final   Creatinine,U  Date Value Ref Range Status  10/29/2016 153.4 mg/dL Final         Passed - HBA1C is between 0 and 7.9 and within 180 days    Hemoglobin A1C  Date Value Ref Range Status  10/10/2021 7.1 (A) 4.0 - 5.6 % Final   Hgb A1c MFr Bld  Date Value Ref Range Status  04/10/2021 7.1 (H) <5.7 % of total Hgb Final    Comment:    For  someone without known diabetes, a hemoglobin A1c value of 6.5% or greater indicates that they may have  diabetes and this should be confirmed with a follow-up  test. . For someone with known diabetes, a value <7% indicates  that their diabetes is well controlled and a value  greater than or equal to 7% indicates suboptimal  control. A1c targets should be individualized based on  duration of diabetes, age, comorbid conditions, and  other considerations. . Currently, no consensus exists regarding use of hemoglobin A1c for diagnosis of diabetes for children. .          Passed - eGFR in normal range and within 360 days    GFR calc Af Amer  Date Value Ref Range Status  04/30/2019 >60 >60 mL/min Final   GFR calc non Af Amer  Date Value Ref Range Status  04/30/2019 >60 >60 mL/min Final   GFR  Date Value Ref Range Status  12/21/2019 53.40 (L) >60.00 mL/min Final   eGFR  Date Value Ref Range Status  10/19/2021 65 >59 mL/min/1.73 Final  Passed - Valid encounter within last 6 months    Recent Outpatient Visits          1 month ago History of DVT (deep vein thrombosis)   [place], NP   7 months ago [facility] to [employer] preventive visit   [facility] [place], Coralie Keens, NP   1 year ago Diabetes 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, unspecified whether long term insulin use [facility])   [facility] [place], Coralie Keens, [place]

## 2021-12-04 ENCOUNTER — Encounter: Payer: Self-pay | Admitting: Internal Medicine

## 2021-12-04 ENCOUNTER — Ambulatory Visit (INDEPENDENT_AMBULATORY_CARE_PROVIDER_SITE_OTHER): Payer: Medicare Other | Admitting: Internal Medicine

## 2021-12-04 VITALS — BP 124/62 | HR 68 | Temp 97.3°F | Wt 240.0 lb

## 2021-12-04 DIAGNOSIS — E041 Nontoxic single thyroid nodule: Secondary | ICD-10-CM

## 2021-12-04 DIAGNOSIS — J029 Acute pharyngitis, unspecified: Secondary | ICD-10-CM | POA: Diagnosis not present

## 2021-12-04 DIAGNOSIS — R490 Dysphonia: Secondary | ICD-10-CM | POA: Diagnosis not present

## 2021-12-04 DIAGNOSIS — J3489 Other specified disorders of nose and nasal sinuses: Secondary | ICD-10-CM | POA: Diagnosis not present

## 2021-12-04 MED ORDER — OMEPRAZOLE 40 MG PO CPDR: 40.0000 mg | DELAYED_RELEASE_CAPSULE | Freq: Every day | ORAL | 0 refills | Status: DC

## 2021-12-04 MED ORDER — FLUTICASONE PROPIONATE 50 MCG/ACT NA SUSP: 2.0000 | Freq: Every day | NASAL | 6 refills | Status: AC

## 2021-12-04 NOTE — Patient Instructions (Signed)
Thyroid Nodule  A thyroid nodule is an isolated growth of thyroid cells that forms a lump in the thyroid gland. The thyroid gland is a butterfly-shaped gland found in the lower front of the neck. It sends chemical messengers (hormones) through the blood to all parts of the body. These hormones are important in regulating body temperature and helping the body use energy. Thyroid nodules are common. Most are not cancerous (are benign). You may have one nodule or several nodules. There are different types of thyroid nodules. They include nodules that: Grow and fill with fluid (thyroid cysts). Produce too much thyroid hormone (hot nodules or hyperthyroid). Produce no thyroid hormone (cold nodules or hypothyroid). Form from cancer cells (thyroid cancers). What are the causes? In most cases, the cause of thyroid nodules is not known. What increases the risk? The following factors may make you more likely to develop thyroid nodules: Age. Thyroid nodules are more common in people who are older than 45 years. Female gender. A family history that includes: Thyroid nodules. Pheochromocytoma. Thyroid carcinoma. Hyperparathyroidism. Certain thyroid diseases, such as Hashimoto's thyroiditis. Lack of iodine in your diet. A history of head and neck radiation, such as from cancer treatments. Type 2 diabetes. What are the signs or symptoms? In many cases, there are no symptoms. If you have symptoms, they may include: A lump in your lower neck. Feeling pressure, fullness, or a tickle in your throat. Pain in your neck, jaw, or ear. Having trouble swallowing or breathing. Hot nodules may cause: Weight loss. Warm, flushed skin. Feeling hot. Feeling nervous. A rapid or irregular heartbeat. Cold nodules may cause: Weight gain. Dry skin. Hair loss, brittle hair, or both. Feeling cold. Fatigue. Thyroid cancer nodules may cause: Hard nodules that can be felt along the thyroid  gland. Hoarseness. Lumps in the tissue (lymph nodes) near your thyroid gland. How is this diagnosed? A thyroid nodule may be felt by your health care provider during a physical exam. This condition may also be diagnosed based on your symptoms. You may also have tests, including: Blood tests to check how well your thyroid is working. An ultrasound. This may be done to confirm the diagnosis. A biopsy. This involves taking a sample from the nodule and looking at it under a microscope. A thyroid scan. This test creates an image of the thyroid gland using a radioactive tracer. Imaging tests such as an MRI or CT scan. These may be done if: A nodule is large. A nodule is blocking your airway. Cancer is suspected. How is this treated? Treatment depends on the cause and size of your nodule or nodules. If a nodule is benign, treatment may not be necessary. Your health care provider may monitor the nodule to see if it goes away without treatment. If a nodule continues to grow, is cancerous, or does not go away, treatment may be needed. Treatment may include: Having a cystic nodule drained with a needle. Ablation therapy. In this treatment, alcohol is injected into the area of the nodule to destroy the cells. Ablation with heat may also be used. This is called thermal ablation. Radioactive iodine. In this treatment, radioactive iodine is given as a pill or liquid that you drink. This substance causes the thyroid nodule to shrink. Surgery to remove the nodule or nodules. Part or all of your thyroid gland may also need to be removed. Medicines to treat hyperthyroidism. Follow these instructions at home: Pay attention to any changes in your thyroid nodule or nodules. Take over-the-counter   and prescription medicines only as told by your health care provider. Keep all follow-up visits. This is important. Contact a health care provider if: You have trouble sleeping. You have muscle weakness. You have  significant weight loss without changing your eating habits. You feel nervous. You have trouble swallowing. You have increased swelling. You have a rapid or irregular heartbeat. Get help right away if: You have chest pain. You faint or lose consciousness. Your nodule makes it hard for you to breathe. These symptoms may be an emergency. Get help right away. Call 911. Do not wait to see if the symptoms will go away. Do not drive yourself to the hospital. Summary A thyroid nodule is an isolated growth of thyroid cells that forms a lump in your thyroid gland. Thyroid nodules are common. Most are not cancerous. Your health care provider may monitor the nodule to see if it goes away without treatment. If a nodule continues to grow, is cancerous, or does not go away, treatment may be needed. Treatment depends on the cause and size of your nodule or nodules. This information is not intended to replace advice given to you by your health care provider. Make sure you discuss any questions you have with your health care provider. Document Revised: 03/12/2021 Document Reviewed: 03/12/2021 Elsevier Patient Education  2023 Elsevier Inc.  

## 2021-12-04 NOTE — Progress Notes (Signed)
Subjective:    Patient ID: Melissa Cabrera, female    DOB: 1956-01-03, 66 y.o.   MRN: [id number]  HPI  Patient presents to clinic today with complaint of nasal tenderness, dry mouth, sore throat and hoarseness.  She noticed this 2 weeks ago.  Her symptoms are intermittent.  She is not having any difficulty swallowing.  She denies headache, runny nose, ear pain, cough, shortness of breath.  She has a history of GERD for which she takes Omeprazole daily.  She does report her Omeprazole was weaned at her last visit.  She is taking Prednisone and Zyrtec daily as well.  She has not tried anything additional OTC.  She has not had sick contacts that she is aware of.   Review of Systems     Past Medical History:  Diagnosis Date   Allergy    Arthritis    Cancer ([place])    endometrial   Chicken pox    Diabetes mellitus without complication (HCC)    GERD (gastroesophageal reflux disease)    Heart murmur    DUE TO RHEUMATIC FEVER PER PT   Hyperlipidemia    Hypertension    Psoriasis    Rheumatic fever    Rosacea     Current Outpatient Medications  Medication Sig Dispense Refill   glucose blood (CONTOUR NEXT TEST) test strip USE TO CHECK GLUCOSE ONCE DAILY  DX: E11.9 100 each 1   acetaminophen (TYLENOL) 650 MG CR tablet Take 1,300 mg by mouth daily.     cetirizine (ZYRTEC) 10 MG tablet Take 10 mg by mouth at bedtime.      Cholecalciferol 25 MCG (1000 UT) tablet Take 1,000 Units by mouth daily.     diphenhydrAMINE (BENADRYL) 25 mg capsule Take 50 mg by mouth at bedtime.      doxycycline (ADOXA) 100 MG tablet Take 50 mg by mouth daily.     ezetimibe (ZETIA) 10 MG tablet Take 1 tablet (10 mg total) by mouth daily. 90 tablet 1   folic acid (FOLVITE) 1 MG tablet Take 1 mg by mouth daily.     furosemide (LASIX) 40 MG tablet Take 1 tablet (40 mg total) by mouth daily. 90 tablet 2   gabapentin (NEURONTIN) 300 MG capsule Take 2 capsules (600 mg total) by mouth 3 (three) times daily. 540 capsule 1    Golimumab (SIMPONI ARIA IV) Inject into the vein. Reports receives via infusion from Rheumatology clinic     losartan (COZAAR) 50 MG tablet Take 1 tablet (50 mg total) by mouth daily. 90 tablet 1   metFORMIN (GLUCOPHAGE) 500 MG tablet TAKE 2 TABLETS EVERY MORNING AND TAKE 1 TABLET EVERY EVENING 270 tablet 0   methotrexate (RHEUMATREX) 2.5 MG tablet Take 25 mg by mouth every Friday. Caution:Chemotherapy. Protect from light.     Microlet Lancets MISC Use to check Blood sugar Once a day.  EX: E11.9 100 each 1   Omega-3 Fatty Acids (FISH OIL PO) Take 1 capsule by mouth in the morning. 1040 mg of fish oil in each capsule     omeprazole (PRILOSEC) 20 MG capsule Take 1 capsule (20 mg total) by mouth daily. 90 capsule 1   predniSONE (DELTASONE) 5 MG tablet Take 5 mg by mouth daily.     rivaroxaban (XARELTO) 10 MG TABS tablet Take 1 tablet (10 mg total) by mouth daily. 90 tablet 1   simvastatin (ZOCOR) 10 MG tablet Take 1 tablet (10 mg total) by mouth daily. 90 tablet 1  vitamin C (ASCORBIC ACID) 500 MG tablet Take 1,000 mg by mouth daily.      Zinc 25 MG TABS Take by mouth.     No current facility-administered medications for this visit.    No Known Allergies  Family History  Problem Relation Age of Onset   Cancer Mother        skin   Dementia Mother    Cancer Sister        skin and liver   Stroke Maternal Grandmother    Skin cancer Brother    Diabetes Neg Hx    Heart disease Neg Hx    Breast cancer Neg Hx     Social History   Socioeconomic History   Marital status: Married    Spouse name: Not on file   Number of children: Not on file   Years of education: Not on file   Highest education level: Not on file  Occupational History   Not on file  Tobacco Use   Smoking status: Former    Packs/day: 4.00    Years: 16.00    Total pack years: 64.00    Types: Cigarettes    Quit date: 09/12/1995    Years since quitting: 26.2   Smokeless tobacco: Never   Tobacco comments:    quit in  1997  Vaping Use   Vaping Use: Never used  Substance and Sexual Activity   Alcohol use: No    Alcohol/week: 0.0 standard drinks of alcohol   Drug use: No   Sexual activity: Yes    Birth control/protection: None  Other Topics Concern   Not on file  Social History Narrative   Not on file   Social Determinants of Health   Financial Resource Strain: Not on file  Food Insecurity: Not on file  Transportation Needs: Not on file  Physical Activity: Not on file  Stress: Not on file  Social Connections: Not on file  Intimate Partner Violence: Not on file     Constitutional: Denies fever, malaise, fatigue, headache or abrupt weight changes.  HEENT: Patient reports nasal discomfort, sore throat and hoarseness.  Denies eye pain, eye redness, ear pain, ringing in the ears, wax buildup, runny nose, nasal congestion, bloody nose. Respiratory: Denies difficulty breathing, shortness of breath, cough or sputum production.   Cardiovascular: Denies chest pain, chest tightness, palpitations or swelling in the hands or feet.  Gastrointestinal: Denies abdominal pain, bloating, constipation, diarrhea or blood in the stool.   No other specific complaints in a complete review of systems (except as listed in HPI above).  Objective:   Physical Exam  BP 124/62 (BP Location: Left Arm, Patient Position: Sitting, Cuff Size: Large)   Pulse 68   Temp (!) 97.3 F (36.3 C) (Temporal)   Wt 240 lb (108.9 kg)   SpO2 98%   BMI 46.87 kg/m   Wt Readings from Last 3 Encounters:  10/10/21 234 lb (106.1 kg)  09/03/21 232 lb 4.8 oz (105.4 kg)  05/15/21 219 lb (99.3 kg)    General: Appears her stated age, obese, in NAD. Skin: Warm, dry and intact.  HEENT: Head: normal shape and size, no sinus tenderness noted; Eyes: sclera white, no icterus, conjunctiva pink, PERRLA and EOMs intact; Nose: mucosa boggy and moist, septum midline; Throat/Mouth: Teeth present, mucosa pink and moist, + PND.  No exudate, lesions or  ulcerations noted.  Neck: No adenopathy noted.  Possible thyroid nodule noted on the right. Cardiovascular: Normal rate and rhythm. S1,S2 noted.  No murmur, rubs or gallops noted.  Pulmonary/Chest: Normal effort and positive vesicular breath sounds. No respiratory distress. No wheezes, rales or ronchi noted.  Neurological: Alert and oriented.    BMET    Component Value Date/Time   NA 141 10/19/2021 1037   K 4.5 10/19/2021 1037   CL 101 10/19/2021 1037   CO2 25 10/19/2021 1037   GLUCOSE 187 (H) 10/19/2021 1037   GLUCOSE 138 (H) 04/10/2021 1008   BUN 14 10/19/2021 1037   CREATININE 0.97 10/19/2021 1037   CREATININE 0.93 04/10/2021 1008   CALCIUM 9.6 10/19/2021 1037   GFRNONAA >60 04/30/2019 1203   GFRAA >60 04/30/2019 1203    Lipid Panel     Component Value Date/Time   CHOL 186 10/19/2021 1037   TRIG 228 (H) 10/19/2021 1037   HDL 58 10/19/2021 1037   CHOLHDL 3.2 10/19/2021 1037   CHOLHDL 2.9 04/10/2021 1008   VLDL 39.0 12/21/2019 1149   LDLCALC 90 10/19/2021 1037   LDLCALC 74 04/10/2021 1008    CBC    Component Value Date/Time   WBC 5.5 10/19/2021 1037   WBC 7.9 04/10/2021 1008   RBC 4.33 10/19/2021 1037   RBC 4.41 04/10/2021 1008   HGB 13.4 10/19/2021 1037   HCT 39.4 10/19/2021 1037   PLT 297 10/19/2021 1037   MCV 91 10/19/2021 1037   MCH 30.9 10/19/2021 1037   MCH 30.2 04/10/2021 1008   MCHC 34.0 10/19/2021 1037   MCHC 32.7 04/10/2021 1008   RDW 14.4 10/19/2021 1037   LYMPHSABS 2.1 04/30/2019 1203   MONOABS 0.6 04/30/2019 1203   EOSABS 0.2 04/30/2019 1203   BASOSABS 0.0 04/30/2019 1203    Hgb A1C Lab Results  Component Value Date   HGBA1C 7.1 (A) 10/10/2021           Assessment & Plan:   Nasal Discomfort, Sore Throat, Hoarseness:  Start Flonase in addition to Zyrtec Increase Omeprazole back to 40 mg daily  Thyroid Nodule:  Ultrasound thyroid ordered for further evaluation  RTC in 3 months for follow-up of chronic conditions Webb Silversmith, NP

## 2021-12-10 ENCOUNTER — Ambulatory Visit
Admission: RE | Admit: 2021-12-10 | Discharge: 2021-12-10 | Disposition: A | Discharge status: Home / Self Care | Payer: Medicare Other | Source: Ambulatory Visit | Attending: Internal Medicine | Admitting: Internal Medicine

## 2021-12-10 DIAGNOSIS — I10 Essential (primary) hypertension: Secondary | ICD-10-CM

## 2021-12-10 DIAGNOSIS — E0843 Diabetes 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 autonomic (poly)neuropathy: Secondary | ICD-10-CM

## 2021-12-10 DIAGNOSIS — E041 Nontoxic single thyroid nodule: Secondary | ICD-10-CM | POA: Insufficient documentation

## 2021-12-10 DIAGNOSIS — E7800 Pure hypercholesterolemia, unspecified: Secondary | ICD-10-CM

## 2022-01-28 ENCOUNTER — Other Ambulatory Visit: Payer: Self-pay | Admitting: Internal Medicine

## 2022-01-29 NOTE — Telephone Encounter (Signed)
Requested Prescriptions  Pending Prescriptions Disp Refills  . losartan (COZAAR) 50 MG tablet [Pharmacy Med Name: LOSARTAN POTASSIUM 50 MG Tablet] 90 tablet 1    Sig: TAKE 1 TABLET EVERY DAY     Cardiovascular:  Angiotensin Receptor Blockers Passed - 01/28/2022  3:27 AM      Passed - Cr in normal range and within 180 days    Creat  Date Value Ref Range Status  04/10/2021 0.93 0.50 - 1.05 mg/dL Final   Creatinine, Ser  Date Value Ref Range Status  10/19/2021 0.97 0.57 - 1.00 mg/dL Final   Creatinine,U  Date Value Ref Range Status  10/29/2016 153.4 mg/dL Final         Passed - K in normal range and within 180 days    Potassium  Date Value Ref Range Status  10/19/2021 4.5 3.5 - 5.2 mmol/L Final         Passed - Patient is not pregnant      Passed - Last BP in normal range    BP Readings from Last 1 Encounters:  12/04/21 124/62         Passed - Valid encounter within last 6 months    Recent Outpatient Visits          1 month ago Thyroid nodule   [facility] [place], Coralie Keens, NP   3 months ago History of DVT (deep vein thrombosis)   [facility], Coralie Keens, NP   9 months ago Welcome to [employer] preventive visit   [facility] [place], Coralie Keens, NP   1 year ago 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 ezetimibe (ZETIA) 10 MG tablet [Pharmacy Med Name: EZETIMIBE 10 MG Tablet] 90 tablet 2    Sig: TAKE 1 TABLET EVERY DAY     Cardiovascular:  Antilipid - Sterol Transport Inhibitors Failed - 01/28/2022  3:27 AM      Failed - Lipid Panel in normal range within the last 12 months    Cholesterol, Total  Date Value Ref Range Status  10/19/2021 186 100 - 199 mg/dL Final   LDL  Cholesterol (Calc)  Date Value Ref Range Status  04/10/2021 74 mg/dL (calc) Final    Comment:    Reference range: <100 . Desirable range <100 mg/dL for primary prevention;   <70 mg/dL for patients with CHD or diabetic patients  with > or = 2 CHD risk factors. Marland Kitchen LDL-C is now calculated using the Martin-Hopkins  calculation, which is a validated novel method providing  better accuracy than the Friedewald equation in the  estimation of LDL-C.  Cresenciano Genre et al. Annamaria Helling. 1749;449(67): 2061-2068  (http://education.[facility].com/faq/FAQ164)    LDL Chol Calc (NIH)  Date Value Ref Range Status  10/19/2021 90 0 - 99 mg/dL Final   Direct LDL  Date Value Ref Range Status  05/16/2014 142.0 mg/dL Final    Comment:    Optimal:  <100 mg/dLNear or Above Optimal:  100-129 mg/dLBorderline High:  130-159 mg/dLHigh:  160-189 mg/dLVery High:  >190 mg/dL   HDL  Date Value Ref Range Status  10/19/2021 58 >39 mg/dL Final  Triglycerides  Date Value Ref Range Status  10/19/2021 228 (H) 0 - 149 mg/dL Final         Passed - AST in normal range and within 360 days    AST  Date Value Ref Range Status  10/19/2021 14 0 - 40 IU/L Final         Passed - ALT in normal range and within 360 days    ALT  Date Value Ref Range Status  10/19/2021 17 0 - 32 IU/L Final         Passed - Patient is not pregnant      Passed - Valid encounter within last 12 months    Recent Outpatient Visits          1 month ago Thyroid nodule   [facility] [place], [place] W, NP   3 months ago History of DVT (deep vein thrombosis)   [place], NP   9 months ago Welcome to [employer] preventive visit   [facility] [place], Coralie Keens, NP   1 year ago Diabetes 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, unspecified whether long term insulin use ([place])   [facility] [place], Coralie Keens, NP      Future Appointments             In 1 month Baity, Coralie Keens, NP [facility], [place]           . simvastatin (ZOCOR) 10 MG tablet [Pharmacy Med Name: SIMVASTATIN 10 MG Tablet] 90 tablet 2    Sig: TAKE 1 TABLET EVERY DAY     Cardiovascular:  Antilipid - Statins Failed - 01/28/2022  3:27 AM      Failed - Lipid Panel in normal range within the last 12 months    Cholesterol, Total  Date Value Ref Range Status  10/19/2021 186 100 - 199 mg/dL Final   LDL Cholesterol (Calc)  Date Value Ref Range Status  04/10/2021 74 mg/dL (calc) Final    Comment:    Reference range: <100 . Desirable range <100 mg/dL for primary prevention;   <70 mg/dL for patients with CHD or diabetic patients  with > or = 2 CHD risk factors. Marland Kitchen LDL-C is now calculated using the Martin-Hopkins  calculation, which is a validated novel method providing  better accuracy than the Friedewald equation in the  estimation of LDL-C.  Cresenciano Genre et al. Annamaria Helling. 7858;850(27): 2061-2068  (http://education.[facility].com/faq/FAQ164)    LDL Chol Calc (NIH)  Date Value Ref Range Status  10/19/2021 90 0 - 99 mg/dL Final   Direct LDL  Date Value Ref Range Status  05/16/2014 142.0 mg/dL Final    Comment:    Optimal:  <100 mg/dLNear or Above Optimal:  100-129 mg/dLBorderline High:  130-159 mg/dLHigh:  160-189 mg/dLVery High:  >190 mg/dL   HDL  Date Value Ref Range Status  10/19/2021 58 >39 mg/dL Final   Triglycerides  Date Value Ref Range Status  10/19/2021 228 (H) 0 - 149 mg/dL Final         Passed - Patient is not pregnant      Passed - Valid encounter within last 12 months    Recent Outpatient Visits          1 month ago Thyroid nodule   [facility] [place], Coralie Keens, NP   3 months ago History of DVT (deep vein thrombosis)   [place], NP   9 months ago Welcome to [employer] preventive visit   [facility] [place], Coralie Keens, NP   1 year ago Diabetes mellitus  due to underlying condition with diabetic autonomic neuropathy, unspecified whether long term insulin use [facility])   [facility] [place], Coralie Keens, NP      Future Appointments            In 1 month Baity, Coralie Keens, NP [facility], [facility]

## 2022-01-31 ENCOUNTER — Other Ambulatory Visit: Payer: Self-pay | Admitting: Internal Medicine

## 2022-02-01 ENCOUNTER — Encounter: Payer: Self-pay | Admitting: Internal Medicine

## 2022-02-01 NOTE — Telephone Encounter (Signed)
Unable to refill per protocol, request is too soon, last refill 09/13/21 for 90 and 1 RF. Should have enough until end of October.Receipt confirmed by pharmacy (09/13/2021 12:13). Will refuse request.  Requested Prescriptions  Pending Prescriptions Disp Refills  . XARELTO 10 MG TABS tablet [Pharmacy Med Name: XARELTO 10 MG Tablet] 90 tablet 1    Sig: TAKE 1 TABLET EVERY DAY     Hematology: Anticoagulants - rivaroxaban Passed - 01/31/2022  9:31 PM      Passed - ALT in normal range and within 360 days    ALT  Date Value Ref Range Status  10/19/2021 17 0 - 32 IU/L Final         Passed - AST in normal range and within 360 days    AST  Date Value Ref Range Status  10/19/2021 14 0 - 40 IU/L Final         Passed - Cr in normal range and within 360 days    Creat  Date Value Ref Range Status  04/10/2021 0.93 0.50 - 1.05 mg/dL Final   Creatinine, Ser  Date Value Ref Range Status  10/19/2021 0.97 0.57 - 1.00 mg/dL Final   Creatinine,U  Date Value Ref Range Status  10/29/2016 153.4 mg/dL Final         Passed - HCT in normal range and within 360 days    Hematocrit  Date Value Ref Range Status  10/19/2021 39.4 34.0 - 46.6 % Final         Passed - HGB in normal range and within 360 days    Hemoglobin  Date Value Ref Range Status  10/19/2021 13.4 11.1 - 15.9 g/dL Final         Passed - PLT in normal range and within 360 days    Platelets  Date Value Ref Range Status  10/19/2021 297 150 - 450 x10E3/uL Final         Passed - eGFR is 15 or above and within 360 days    GFR calc Af Amer  Date Value Ref Range Status  04/30/2019 >60 >60 mL/min Final   GFR calc non Af Amer  Date Value Ref Range Status  04/30/2019 >60 >60 mL/min Final   GFR  Date Value Ref Range Status  12/21/2019 53.40 (L) >60.00 mL/min Final   eGFR  Date Value Ref Range Status  10/19/2021 65 >59 mL/min/1.73 Final         Passed - Patient is not pregnant      Passed - Valid encounter within last 12 months     Recent Outpatient Visits          1 month ago Thyroid nodule   [facility] [place], Coralie Keens, NP   3 months ago History of DVT (deep vein thrombosis)   [facility], Coralie Keens, NP   9 months ago Welcome to [employer] preventive visit   [place], Coralie Keens, NP   1 year ago Diabetes 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, unspecified whether long term insulin use ([place])   [facility] [place], Coralie Keens, NP      Future Appointments            In 1 month Baity, Coralie Keens, NP [facility], [facility]

## 2022-02-04 MED ORDER — RIVAROXABAN 10 MG PO TABS: 10.0000 mg | ORAL_TABLET | Freq: Every day | ORAL | 0 refills | Status: DC

## 2022-02-10 ENCOUNTER — Other Ambulatory Visit: Payer: Self-pay | Admitting: Internal Medicine

## 2022-02-11 NOTE — Telephone Encounter (Signed)
Requested Prescriptions  Pending Prescriptions Disp Refills  . omeprazole (PRILOSEC) 40 MG capsule [Pharmacy Med Name: Omeprazole 40 MG Oral Capsule Delayed Release] 90 capsule 3    Sig: Take 1 capsule by mouth once daily     Gastroenterology: Proton Pump Inhibitors Passed - 02/10/2022  6:39 PM      Passed - Valid encounter within last 12 months    Recent Outpatient Visits          2 months ago Thyroid nodule   [facility] [place], [place] W, NP   4 months ago History of DVT (deep vein thrombosis)   [facility] [place], Coralie Keens, NP   10 months ago Welcome to [employer] preventive visit   [facility] [place], Coralie Keens, NP   1 year ago Diabetes 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, unspecified whether long term insulin use ([place])   [facility] [place], Coralie Keens, NP      Future Appointments            In 3 weeks Garnette Gunner, Coralie Keens, NP [facility], [facility]

## 2022-03-07 ENCOUNTER — Encounter: Payer: Self-pay | Admitting: Internal Medicine

## 2022-03-07 ENCOUNTER — Ambulatory Visit (INDEPENDENT_AMBULATORY_CARE_PROVIDER_SITE_OTHER): Payer: Medicare Other | Admitting: Internal Medicine

## 2022-03-07 VITALS — BP 132/88 | HR 69 | Temp 96.6°F | Wt 252.0 lb

## 2022-03-07 DIAGNOSIS — L719 Rosacea, unspecified: Secondary | ICD-10-CM

## 2022-03-07 DIAGNOSIS — E0843 Diabetes 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 autonomic (poly)neuropathy: Secondary | ICD-10-CM

## 2022-03-07 DIAGNOSIS — E66813 Obesity, class 3: Secondary | ICD-10-CM

## 2022-03-07 DIAGNOSIS — I5032 Chronic diastolic (congestive) heart failure: Secondary | ICD-10-CM

## 2022-03-07 DIAGNOSIS — L4050 Arthropathic psoriasis, unspecified: Secondary | ICD-10-CM

## 2022-03-07 DIAGNOSIS — D692 Other nonthrombocytopenic purpura: Secondary | ICD-10-CM | POA: Diagnosis not present

## 2022-03-07 DIAGNOSIS — Z86718 Personal history of other venous thrombosis and embolism: Secondary | ICD-10-CM

## 2022-03-07 DIAGNOSIS — I10 Essential (primary) hypertension: Secondary | ICD-10-CM | POA: Diagnosis not present

## 2022-03-07 DIAGNOSIS — E782 Mixed hyperlipidemia: Secondary | ICD-10-CM

## 2022-03-07 DIAGNOSIS — Z6841 Body Mass Index (BMI) 40.0 and over, adult: Secondary | ICD-10-CM

## 2022-03-07 DIAGNOSIS — E1143 Type 2 diabetes mellitus with diabetic autonomic (poly)neuropathy: Secondary | ICD-10-CM

## 2022-03-07 DIAGNOSIS — K219 Gastro-esophageal reflux disease without esophagitis: Secondary | ICD-10-CM

## 2022-03-07 LAB — POCT GLYCOSYLATED HEMOGLOBIN (HGB A1C): Hemoglobin A1C: 7.6 % — AB (ref 4.0–5.6)

## 2022-03-07 MED ORDER — RYBELSUS 3 MG PO TABS
3.0000 mg | ORAL_TABLET | Freq: Every day | ORAL | 1 refills | Status: DC
Start: 2022-03-07 — End: 2022-05-24

## 2022-03-07 NOTE — Assessment & Plan Note (Signed)
Try to identify and avoid foods that trigger reflux Encourage weight loss as this can help reduce reflux symptoms Continue omeprazole  

## 2022-03-07 NOTE — Assessment & Plan Note (Signed)
Continue doxycycline per dermatology. °

## 2022-03-07 NOTE — Assessment & Plan Note (Signed)
Encourage low-carb diet and exercise for weight loss We will try Rybelsus 3 mg daily

## 2022-03-07 NOTE — Assessment & Plan Note (Signed)
CBC today.  

## 2022-03-07 NOTE — Assessment & Plan Note (Signed)
C-Met and lipid profile today Encouraged her to consume a low-fat diet Continue simvastatin, ezetimibe and fish oil

## 2022-03-07 NOTE — Assessment & Plan Note (Signed)
Discussed the importance of good blood sugar control Continue gabapentin

## 2022-03-07 NOTE — Progress Notes (Signed)
Subjective:    Patient ID: Melissa Cabrera, female    DOB: [birth year], 66 y.o.   MRN: [id number]  HPI  Patient presents to clinic today for follow-up of chronic conditions.  CHF, diastolic: She reports intermittent lower extremity edema and shortness of breath.  She denies chronic cough.  She is taking Losartan and Furosemide as prescribed.  Echo from 07/2017 reviewed.  She no longer follows with cardiology.  HTN: Her BP today is 132/88.  She is taking Losartan and Furosemide as prescribed.  ECG from 11/2018 reviewed.  GERD: She is not sure what triggers this.  She denies breakthrough on Omeprazole.  There is no upper GI on file.  HLD: Her last LDL was 90, triglycerides 228, 10/2021.  She denies myalgias on Simvastatin, Ezetimibe and Fish Oil.  She does not consume a low-fat diet.  Psoriatic Arthritis: Managed with Methotrexate, Gabapentin, Prednisone and Simponi.  She follows with rheumatology.  DM2 with Peripheral Neuropathy: Her last A1c was 7.1%, 09/2021.  She is taking Metformin and Gabapentin as prescribed.  She does not check her sugars. She checks her feet routinely.  Her last eye exam was 10/2021.  Flu never.  Pneumovax never.  Prevnar never.  COVID never.  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  She no longer follows with hematology.  Rosacea: Managed with Doxycycline.  She follows with dermatology.  Review of Systems     Past Medical History:  Diagnosis Date   Allergy    Arthritis    Cancer ([place])    endometrial   Chicken pox    Diabetes mellitus without complication (HCC)    GERD (gastroesophageal reflux disease)    Heart murmur    DUE TO RHEUMATIC FEVER PER PT   Hyperlipidemia    Hypertension    Psoriasis    Rheumatic fever    Rosacea     Current Outpatient Medications  Medication Sig Dispense Refill   acetaminophen (TYLENOL) 650 MG CR tablet Take 1,300 mg by mouth daily.     cetirizine (ZYRTEC) 10 MG tablet Take 10 mg by mouth at bedtime.       Cholecalciferol 25 MCG (1000 UT) tablet Take 1,000 Units by mouth daily.     diphenhydrAMINE (BENADRYL) 25 mg capsule Take 50 mg by mouth at bedtime.      doxycycline (ADOXA) 100 MG tablet Take 50 mg by mouth daily.     ezetimibe (ZETIA) 10 MG tablet TAKE 1 TABLET EVERY DAY 90 tablet 2   fluticasone (FLONASE) 50 MCG/ACT nasal spray Place 2 sprays into both nostrils daily. 16 g 6   folic acid (FOLVITE) 1 MG tablet Take 1 mg by mouth daily.     furosemide (LASIX) 40 MG tablet Take 1 tablet (40 mg total) by mouth daily. 90 tablet 2   gabapentin (NEURONTIN) 300 MG capsule Take 2 capsules (600 mg total) by mouth 3 (three) times daily. 540 capsule 1   glucose blood (CONTOUR NEXT TEST) test strip USE TO CHECK GLUCOSE ONCE DAILY  DX: E11.9 100 each 1   Golimumab (SIMPONI ARIA IV) Inject into the vein. Reports receives via infusion from Rheumatology clinic     losartan (COZAAR) 50 MG tablet TAKE 1 TABLET EVERY DAY 90 tablet 1   metFORMIN (GLUCOPHAGE) 500 MG tablet TAKE 2 TABLETS EVERY MORNING AND TAKE 1 TABLET EVERY EVENING 270 tablet 0   methotrexate (RHEUMATREX) 2.5 MG tablet Take 25 mg by mouth every Friday. Caution:Chemotherapy. Protect from light.  Microlet Lancets MISC Use to check Blood sugar Once a day.  EX: E11.9 100 each 1   Omega-3 Fatty Acids (FISH OIL PO) Take 1 capsule by mouth in the morning. 1040 mg of fish oil in each capsule     omeprazole (PRILOSEC) 40 MG capsule Take 1 capsule by mouth once daily 90 capsule 3   predniSONE (DELTASONE) 5 MG tablet Take 5 mg by mouth daily.     rivaroxaban (XARELTO) 10 MG TABS tablet Take 1 tablet (10 mg total) by mouth daily. 90 tablet 0   simvastatin (ZOCOR) 10 MG tablet TAKE 1 TABLET EVERY DAY 90 tablet 2   vitamin C (ASCORBIC ACID) 500 MG tablet Take 1,000 mg by mouth daily.      Zinc 25 MG TABS Take by mouth.     No current facility-administered medications for this visit.    No Known Allergies  Family History  Problem Relation Age of  Onset   Cancer Mother        skin   Dementia Mother    Cancer Sister        skin and liver   Stroke Maternal Grandmother    Skin cancer Brother    Diabetes Neg Hx    Heart disease Neg Hx    Breast cancer Neg Hx     Social History   Socioeconomic History   Marital status: Married    Spouse name: Not on file   Number of children: Not on file   Years of education: Not on file   Highest education level: Not on file  Occupational History   Not on file  Tobacco Use   Smoking status: Former    Packs/day: 4.00    Years: 16.00    Total pack years: 64.00    Types: Cigarettes    Quit date: 09/12/1995    Years since quitting: 26.5   Smokeless tobacco: Never   Tobacco comments:    quit in 1997  Vaping Use   Vaping Use: Never used  Substance and Sexual Activity   Alcohol use: No    Alcohol/week: 0.0 standard drinks of alcohol   Drug use: No   Sexual activity: Yes    Birth control/protection: None  Other Topics Concern   Not on file  Social History Narrative   Not on file   Social Determinants of Health   Financial Resource Strain: Not on file  Food Insecurity: Not on file  Transportation Needs: Not on file  Physical Activity: Not on file  Stress: Not on file  Social Connections: Not on file  Intimate Partner Violence: Not on file     Constitutional: Denies fever, malaise, fatigue, headache or abrupt weight changes.  HEENT: Denies eye pain, eye redness, ear pain, ringing in the ears, wax buildup, runny nose, nasal congestion, bloody nose, or sore throat. Respiratory: Patient reports intermittent shortness of breath.  Denies difficulty breathing, cough or sputum production.   Cardiovascular: Patient reports swelling in legs.  Denies chest pain, chest tightness, palpitations or swelling in the hands.  Gastrointestinal: Denies abdominal pain, bloating, constipation, diarrhea or blood in the stool.  GU: Denies urgency, frequency, pain with urination, burning sensation,  blood in urine, odor or discharge. Musculoskeletal: Patient reports joint pain.  Denies decrease in range of motion, difficulty with gait, muscle pain or joint swelling.  Skin: Denies redness, rashes, lesions or ulcercations.  Neurological: Patient reports neuropathic pain.  Denies dizziness, difficulty with memory, difficulty with speech or problems with  balance and coordination.  Psych: Denies anxiety, depression, SI/HI.  No other specific complaints in a complete review of systems (except as listed in HPI above).  Objective:   Physical Exam  BP 132/88 (BP Location: Right Arm, Patient Position: Sitting, Cuff Size: Large)   Pulse 69   Temp (!) 96.6 F (35.9 C) (Temporal)   Wt 252 lb (114.3 kg)   SpO2 100%   BMI 49.22 kg/m   Wt Readings from Last 3 Encounters:  12/04/21 240 lb (108.9 kg)  10/10/21 234 lb (106.1 kg)  09/03/21 232 lb 4.8 oz (105.4 kg)    General: Appears her stated age, obese, in NAD. Skin: Warm, dry and intact. No ulcerations noted.  Senile purpura noted of bilateral upper extremities. HEENT: Head: normal shape and size; Eyes: sclera white, no icterus, conjunctiva pink, PERRLA and EOMs intact; Neck:  Neck supple, trachea midline. No thyromegaly present.  Cardiovascular: Normal rate and rhythm. S1,S2 noted.  No murmur, rubs or gallops noted.  Trace nonpitting BLE edema. No carotid bruits noted. Pulmonary/Chest: Normal effort and positive vesicular breath sounds. No respiratory distress. No wheezes, rales or ronchi noted.  Abdomen:  Normal bowel sounds.  Musculoskeletal: Limping gait without device. Neurological: Alert and oriented. Cranial nerves II-XII grossly intact. Coordination normal.  Psychiatric: Mood and affect normal. Behavior is normal. Judgment and thought content normal.   BMET    Component Value Date/Time   NA 141 10/19/2021 1037   K 4.5 10/19/2021 1037   CL 101 10/19/2021 1037   CO2 25 10/19/2021 1037   GLUCOSE 187 (H) 10/19/2021 1037    GLUCOSE 138 (H) 04/10/2021 1008   BUN 14 10/19/2021 1037   CREATININE 0.97 10/19/2021 1037   CREATININE 0.93 04/10/2021 1008   CALCIUM 9.6 10/19/2021 1037   GFRNONAA >60 04/30/2019 1203   GFRAA >60 04/30/2019 1203    Lipid Panel     Component Value Date/Time   CHOL 186 10/19/2021 1037   TRIG 228 (H) 10/19/2021 1037   HDL 58 10/19/2021 1037   CHOLHDL 3.2 10/19/2021 1037   CHOLHDL 2.9 04/10/2021 1008   VLDL 39.0 12/21/2019 1149   LDLCALC 90 10/19/2021 1037   LDLCALC 74 04/10/2021 1008    CBC    Component Value Date/Time   WBC 5.5 10/19/2021 1037   WBC 7.9 04/10/2021 1008   RBC 4.33 10/19/2021 1037   RBC 4.41 04/10/2021 1008   HGB 13.4 10/19/2021 1037   HCT 39.4 10/19/2021 1037   PLT 297 10/19/2021 1037   MCV 91 10/19/2021 1037   MCH 30.9 10/19/2021 1037   MCH 30.2 04/10/2021 1008   MCHC 34.0 10/19/2021 1037   MCHC 32.7 04/10/2021 1008   RDW 14.4 10/19/2021 1037   LYMPHSABS 2.1 04/30/2019 1203   MONOABS 0.6 04/30/2019 1203   EOSABS 0.2 04/30/2019 1203   BASOSABS 0.0 04/30/2019 1203    Hgb A1C Lab Results  Component Value Date   HGBA1C 7.1 (A) 10/10/2021            Assessment & Plan:     RTC in 3 months, follow-up chronic conditions Webb Sil[place], NP

## 2022-03-07 NOTE — Assessment & Plan Note (Signed)
Continue Xarelto CBC today

## 2022-03-07 NOTE — Assessment & Plan Note (Signed)
Continue methotrexate, gabapentin, prednisone and Simponi per rheumatology

## 2022-03-07 NOTE — Assessment & Plan Note (Signed)
POCT A1c 7.6% Urine microalbumin was checked 08/2021 Encourage low-carb diet and exercise for weight loss Continue metformin and gabapentin We will add Rybelsus 3 mg daily Encourage routine eye exam Encourage routine foot exam She declines immunizations

## 2022-03-07 NOTE — Assessment & Plan Note (Signed)
Encourage low-salt diet Monitor weights daily Continue losartan and furosemide C-Met today

## 2022-03-07 NOTE — Patient Instructions (Signed)

## 2022-03-07 NOTE — Assessment & Plan Note (Signed)
Controlled on losartan and furosemide Reinforced DASH diet and exercise weight loss C-Met today

## 2022-03-08 LAB — COMPREHENSIVE METABOLIC PANEL
ALT: 18 IU/L (ref 0–32)
AST: 15 IU/L (ref 0–40)
Albumin/Globulin Ratio: 2 (ref 1.2–2.2)
Albumin: 4.3 g/dL (ref 3.9–4.9)
Alkaline Phosphatase: 76 IU/L (ref 44–121)
BUN/Creatinine Ratio: 19 (ref 12–28)
BUN: 18 mg/dL (ref 8–27)
Bilirubin Total: 0.2 mg/dL (ref 0.0–1.2)
CO2: 26 mmol/L (ref 20–29)
Calcium: 9.5 mg/dL (ref 8.7–10.3)
Chloride: 101 mmol/L (ref 96–106)
Creatinine, Ser: 0.93 mg/dL (ref 0.57–1.00)
Globulin, Total: 2.1 g/dL (ref 1.5–4.5)
Glucose: 165 mg/dL — ABNORMAL HIGH (ref 70–99)
Potassium: 4.3 mmol/L (ref 3.5–5.2)
Sodium: 142 mmol/L (ref 134–144)
Total Protein: 6.4 g/dL (ref 6.0–8.5)
eGFR: 68 mL/min/{1.73_m2} (ref >59)

## 2022-03-08 LAB — CBC
Hematocrit: 38 % (ref 34.0–46.6)
Hemoglobin: 12.7 g/dL (ref 11.1–15.9)
MCH: 30.8 pg (ref 26.6–33.0)
MCHC: 33.4 g/dL (ref 31.5–35.7)
MCV: 92 fL (ref 79–97)
Platelets: 298 10*3/uL (ref 150–450)
RBC: 4.12 x10E6/uL (ref 3.77–5.28)
RDW: 14.3 % (ref 11.7–15.4)
WBC: 8.1 10*3/uL (ref 3.4–10.8)

## 2022-03-08 LAB — LIPID PANEL
Chol/HDL Ratio: 2.8 ratio (ref 0.0–4.4)
Cholesterol, Total: 153 mg/dL (ref 100–199)
HDL: 55 mg/dL (ref >39)
LDL Chol Calc (NIH): 53 mg/dL (ref 0–99)
Triglycerides: 290 mg/dL — ABNORMAL HIGH (ref 0–149)
VLDL Cholesterol Cal: 45 mg/dL — ABNORMAL HIGH (ref 5–40)

## 2022-03-21 ENCOUNTER — Encounter: Payer: Self-pay | Admitting: Internal Medicine

## 2022-04-08 ENCOUNTER — Other Ambulatory Visit: Payer: Self-pay | Admitting: Internal Medicine

## 2022-04-09 NOTE — Telephone Encounter (Signed)
Requested Prescriptions  Pending Prescriptions Disp Refills   metFORMIN (GLUCOPHAGE) 500 MG tablet [Pharmacy Med Name: METFORMIN HYDROCHLORIDE 500 MG Tablet] 270 tablet 1    Sig: TAKE 2 TABLETS EVERY MORNING AND TAKE 1 TABLET EVERY EVENING     Endocrinology:  Diabetes - Biguanides Failed - 04/08/2022  3:02 AM      Failed - B12 Level in normal range and within 720 days    No results found for: "VITAMINB12"       Failed - CBC within normal limits and completed in the last 12 months    WBC  Date Value Ref Range Status  03/07/2022 8.1 3.4 - 10.8 x10E3/uL Final  04/10/2021 7.9 3.8 - 10.8 Thousand/uL Final   RBC  Date Value Ref Range Status  03/07/2022 4.12 3.77 - 5.28 x10E6/uL Final  04/10/2021 4.41 3.80 - 5.10 Million/uL Final   Hemoglobin  Date Value Ref Range Status  03/07/2022 12.7 11.1 - 15.9 g/dL Final   Hematocrit  Date Value Ref Range Status  03/07/2022 38.0 34.0 - 46.6 % Final   MCHC  Date Value Ref Range Status  03/07/2022 33.4 31.5 - 35.7 g/dL Final  04/10/2021 32.7 32.0 - 36.0 g/dL Final   [facility]  Date Value Ref Range Status  03/07/2022 30.8 26.6 - 33.0 pg Final  04/10/2021 30.2 27.0 - 33.0 pg Final   MCV  Date Value Ref Range Status  03/07/2022 92 79 - 97 fL Final   No results found for: "PLTCOUNTKUC", "LABPLAT", "POCPLA" RDW  Date Value Ref Range Status  03/07/2022 14.3 11.7 - 15.4 % Final         Passed - Cr in normal range and within 360 days    Creat  Date Value Ref Range Status  04/10/2021 0.93 0.50 - 1.05 mg/dL Final   Creatinine, Ser  Date Value Ref Range Status  03/07/2022 0.93 0.57 - 1.00 mg/dL Final   Creatinine,U  Date Value Ref Range Status  10/29/2016 153.4 mg/dL Final         Passed - HBA1C is between 0 and 7.9 and within 180 days    Hemoglobin A1C  Date Value Ref Range Status  03/07/2022 7.6 (A) 4.0 - 5.6 % Final   Hgb A1c MFr Bld  Date Value Ref Range Status  04/10/2021 7.1 (H) <5.7 % of total Hgb Final    Comment:    For  someone without known diabetes, a hemoglobin A1c value of 6.5% or greater indicates that they may have  diabetes and this should be confirmed with a follow-up  test. . For someone with known diabetes, a value <7% indicates  that their diabetes is well controlled and a value  greater than or equal to 7% indicates suboptimal  control. A1c targets should be individualized based on  duration of diabetes, age, comorbid conditions, and  other considerations. . Currently, no consensus exists regarding use of hemoglobin A1c for diagnosis of diabetes for children. .          Passed - eGFR in normal range and within 360 days    GFR calc Af Amer  Date Value Ref Range Status  04/30/2019 >60 >60 mL/min Final   GFR calc non Af Amer  Date Value Ref Range Status  04/30/2019 >60 >60 mL/min Final   GFR  Date Value Ref Range Status  12/21/2019 53.40 (L) >60.00 mL/min Final   eGFR  Date Value Ref Range Status  03/07/2022 68 >59 mL/min/1.73 Final  Passed - Valid encounter within last 6 months    Recent Outpatient Visits           1 month ago Diabetes 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, unspecified whether long term insulin use ([place])   [facility] [place], Coralie Keens, NP   4 months ago Thyroid nodule   [facility] [place], Coralie Keens, NP   6 months ago History of DVT (deep vein thrombosis)   [facility], Coralie Keens, NP   12 months ago Welcome to [employer] preventive visit   [facility] [place], Coralie Keens, NP   1 year ago Diabetes 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, unspecified whether long term insulin use ([place])   [facility] [place], Coralie Keens, NP       Future Appointments             In 1 month [place], Coralie Keens, NP [facility], [facility]             furosemide (LASIX) 40 MG tablet [Pharmacy Med Name: FUROSEMIDE 40 MG  Tablet] 90 tablet 1    Sig: TAKE 1 TABLET EVERY DAY     Cardiovascular:  Diuretics - Loop Failed - 04/08/2022  3:02 AM      Failed - Mg Level in normal range and within 180 days    No results found for: "MG"       Passed - K in normal range and within 180 days    Potassium  Date Value Ref Range Status  03/07/2022 4.3 3.5 - 5.2 mmol/L Final         Passed - Ca in normal range and within 180 days    Calcium  Date Value Ref Range Status  03/07/2022 9.5 8.7 - 10.3 mg/dL Final         Passed - Na in normal range and within 180 days    Sodium  Date Value Ref Range Status  03/07/2022 142 134 - 144 mmol/L Final         Passed - Cr in normal range and within 180 days    Creat  Date Value Ref Range Status  04/10/2021 0.93 0.50 - 1.05 mg/dL Final   Creatinine, Ser  Date Value Ref Range Status  03/07/2022 0.93 0.57 - 1.00 mg/dL Final   Creatinine,U  Date Value Ref Range Status  10/29/2016 153.4 mg/dL Final         Passed - Cl in normal range and within 180 days    Chloride  Date Value Ref Range Status  03/07/2022 101 96 - 106 mmol/L Final         Passed - Last BP in normal range    BP Readings from Last 1 Encounters:  03/07/22 132/88         Passed - Valid encounter within last 6 months    Recent Outpatient Visits           1 month ago Diabetes 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, unspecified whether long term insulin use ([place])   [facility] [place], Coralie Keens, NP   4 months ago Thyroid nodule   [facility] [place], Coralie Keens, NP   6 months ago History of DVT (deep vein thrombosis)   [facility] [name], NP   12 months ago Welcome to [employer] preventive visit  [facility] [place], [place] W, NP   1 year ago Diabetes 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, unspecified whether long term insulin use [facility])   [facility]  [place], Coralie Keens, NP       Future Appointments             In 1 month Baity, Coralie Keens, NP [facility], [facility]

## 2022-04-11 DIAGNOSIS — G5762 Lesion of plantar nerve, left lower limb: Secondary | ICD-10-CM | POA: Insufficient documentation

## 2022-04-12 ENCOUNTER — Ambulatory Visit (INDEPENDENT_AMBULATORY_CARE_PROVIDER_SITE_OTHER): Payer: Medicare Other

## 2022-04-12 VITALS — Wt 252.0 lb

## 2022-04-12 DIAGNOSIS — Z0000 Encounter for general adult medical examination without abnormal findings: Secondary | ICD-10-CM

## 2022-04-12 NOTE — Patient Instructions (Signed)
Melissa Cabrera , Thank you for taking time to come for your Medicare Wellness Visit. I appreciate your ongoing commitment to your health goals. Please review the following plan we discussed and let me know if I can assist you in the future.   Screening recommendations/referrals: Colonoscopy: 05/15/21 Mammogram: 09/13/21 Bone Density: 05/19/18 Recommended yearly ophthalmology/optometry visit for glaucoma screening and checkup Recommended yearly dental visit for hygiene and checkup  Vaccinations: Influenza vaccine: n/d Pneumococcal vaccine: n/d Tdap vaccine: n/d Shingles vaccine: n/d   Covid-19:n/d  Advanced directives: no  Conditions/risks identified: none  Next appointment: Follow up in one year for your annual wellness visit 04/17/22 @ 10:15 am by phone   Preventive Care 65 Years and Older, Female Preventive care refers to lifestyle choices and visits with your health care provider that can promote health and wellness. What does preventive care include? A yearly physical exam. This is also called an annual well check. Dental exams once or twice a year. Routine eye exams. Ask your health care provider how often you should have your eyes checked. Personal lifestyle choices, including: Daily care of your teeth and gums. Regular physical activity. Eating a healthy diet. Avoiding tobacco and drug use. Limiting alcohol use. Practicing safe sex. Taking low-dose aspirin every day. Taking vitamin and mineral supplements as recommended by your health care provider. What happens during an annual well check? The services and screenings done by your health care provider during your annual well check will depend on your age, overall health, lifestyle risk factors, and family history of disease. Counseling  Your health care provider may ask you questions about your: Alcohol use. Tobacco use. Drug use. Emotional well-being. Home and relationship well-being. Sexual activity. Eating  habits. History of falls. Memory and ability to understand (cognition). Work and work Statistician. Reproductive health. Screening  You may have the following tests or measurements: Height, weight, and BMI. Blood pressure. Lipid and cholesterol levels. These may be checked every 5 years, or more frequently if you are over 32 years old. Skin check. Lung cancer screening. You may have this screening every year starting at age 43 if you have a 30-pack-year history of smoking and currently smoke or have quit within the past 15 years. Fecal occult blood test (FOBT) of the stool. You may have this test every year starting at age 66. Flexible sigmoidoscopy or colonoscopy. You may have a sigmoidoscopy every 5 years or a colonoscopy every 10 years starting at age 64. Hepatitis C blood test. Hepatitis B blood test. Sexually transmitted disease (STD) testing. Diabetes screening. This is done by checking your blood sugar (glucose) after you have not eaten for a while (fasting). You may have this done every 1-3 years. Bone density scan. This is done to screen for osteoporosis. You may have this done starting at age 42. Mammogram. This may be done every 1-2 years. Talk to your health care provider about how often you should have regular mammograms. Talk with your health care provider about your test results, treatment options, and if necessary, the need for more tests. Vaccines  Your health care provider may recommend certain vaccines, such as: Influenza vaccine. This is recommended every year. Tetanus, diphtheria, and acellular pertussis (Tdap, Td) vaccine. You may need a Td booster every 10 years. Zoster vaccine. You may need this after age 51. Pneumococcal 13-valent conjugate (PCV13) vaccine. One dose is recommended after age 1. Pneumococcal polysaccharide (PPSV23) vaccine. One dose is recommended after age 4. Talk to your health care provider about  which screenings and vaccines you need and how  often you need them. This information is not intended to replace advice given to you by your health care provider. Make sure you discuss any questions you have with your health care provider. Document Released: 05/26/2015 Document Revised: 01/17/2016 Document Reviewed: 02/28/2015 Elsevier Interactive Patient Education  2017 [place] Prevention in the Home Falls can cause injuries. They can happen to people of all ages. There are many things you can do to make your home safe and to help prevent falls. What can I do on the outside of my home? Regularly fix the edges of walkways and driveways and fix any cracks. Remove anything that might make you trip as you walk through a door, such as a raised step or threshold. Trim any bushes or trees on the path to your home. Use bright outdoor lighting. Clear any walking paths of anything that might make someone trip, such as rocks or tools. Regularly check to see if handrails are loose or broken. Make sure that both sides of any steps have handrails. Any raised decks and porches should have guardrails on the edges. Have any leaves, snow, or ice cleared regularly. Use sand or salt on walking paths during winter. Clean up any spills in your garage right away. This includes oil or grease spills. What can I do in the bathroom? Use night lights. Install grab bars by the toilet and in the tub and shower. Do not use towel bars as grab bars. Use non-skid mats or decals in the tub or shower. If you need to sit down in the shower, use a plastic, non-slip stool. Keep the floor dry. Clean up any water that spills on the floor as soon as it happens. Remove soap buildup in the tub or shower regularly. Attach bath mats securely with double-sided non-slip rug tape. Do not have throw rugs and other things on the floor that can make you trip. What can I do in the bedroom? Use night lights. Make sure that you have a light by your bed that is easy to  reach. Do not use any sheets or blankets that are too big for your bed. They should not hang down onto the floor. Have a firm chair that has side arms. You can use this for support while you get dressed. Do not have throw rugs and other things on the floor that can make you trip. What can I do in the kitchen? Clean up any spills right away. Avoid walking on wet floors. Keep items that you use a lot in easy-to-reach places. If you need to reach something above you, use a strong step stool that has a grab bar. Keep electrical cords out of the way. Do not use floor polish or wax that makes floors slippery. If you must use wax, use non-skid floor wax. Do not have throw rugs and other things on the floor that can make you trip. What can I do with my stairs? Do not leave any items on the stairs. Make sure that there are handrails on both sides of the stairs and use them. Fix handrails that are broken or loose. Make sure that handrails are as long as the stairways. Check any carpeting to make sure that it is firmly attached to the stairs. Fix any carpet that is loose or worn. Avoid having throw rugs at the top or bottom of the stairs. If you do have throw rugs, attach them to the floor with  carpet tape. Make sure that you have a light switch at the top of the stairs and the bottom of the stairs. If you do not have them, ask someone to add them for you. What else can I do to help prevent falls? Wear shoes that: Do not have high heels. Have rubber bottoms. Are comfortable and fit you well. Are closed at the toe. Do not wear sandals. If you use a stepladder: Make sure that it is fully opened. Do not climb a closed stepladder. Make sure that both sides of the stepladder are locked into place. Ask someone to hold it for you, if possible. Clearly mark and make sure that you can see: Any grab bars or handrails. First and last steps. Where the edge of each step is. Use tools that help you move  around (mobility aids) if they are needed. These include: Canes. Walkers. Scooters. Crutches. Turn on the lights when you go into a dark area. Replace any light bulbs as soon as they burn out. Set up your furniture so you have a clear path. Avoid moving your furniture around. If any of your floors are uneven, fix them. If there are any pets around you, be aware of where they are. Review your medicines with your doctor. Some medicines can make you feel dizzy. This can increase your chance of falling. Ask your doctor what other things that you can do to help prevent falls. This information is not intended to replace advice given to you by your health care provider. Make sure you discuss any questions you have with your health care provider. Document Released: 02/23/2009 Document Revised: 10/05/2015 Document Reviewed: 06/03/2014 Elsevier Interactive Patient Education  2017 [employer].

## 2022-04-12 NOTE — Progress Notes (Signed)
Virtual Visit via Telephone Note  I connected with  Melissa Cabrera on 04/12/22 at 11:00 AM EST by telephone and verified that I am speaking with the correct person using two identifiers.  Location: Patient: home Provider: [facility] Persons participating in the virtual visit: [place]   I discussed the limitations, risks, security and privacy concerns of performing an evaluation and management service by telephone and the availability of in person appointments. The patient expressed understanding and agreed to proceed.  Interactive audio and video telecommunications were attempted between this nurse and patient, however failed, due to patient having technical difficulties OR patient did not have access to video capability.  We continued and completed visit with audio only.  Some vital signs may be absent or patient reported.   Dionisio David, LPN  Subjective:   Melissa Cabrera is a 66 y.o. female who presents for Medicare Annual (Subsequent) preventive examination.  Review of Systems     Cardiac Risk Factors include: advanced age (>39mn, >>65women);diabetes mellitus;hypertension     Objective:    Today's Vitals   04/12/22 1105  PainSc: 6    There is no height or weight on file to calculate BMI.     04/12/2022   11:10 AM 09/03/2021   10:07 AM 05/15/2021    9:00 AM 04/30/2019   11:21 AM 12/08/2016    4:10 PM 12/04/2016    6:16 AM 11/25/2016   10:23 AM  Advanced Directives  Does Patient Have a Medical Advance Directive? No No No No No No No  Would patient like information on creating a medical advance directive? No - Patient declined   No - Patient declined  No - Patient declined No - Patient declined    Current Medications (verified) Outpatient Encounter Medications as of 04/12/2022  Medication Sig   acetaminophen (TYLENOL) 650 MG CR tablet Take 1,300 mg by mouth daily.   cetirizine (ZYRTEC) 10 MG tablet Take 10 mg by mouth at bedtime.    Cholecalciferol 25 MCG  (1000 UT) tablet Take 1,000 Units by mouth daily.   diphenhydrAMINE (BENADRYL) 25 mg capsule Take 50 mg by mouth at bedtime.    doxycycline (ADOXA) 100 MG tablet Take 50 mg by mouth daily.   fluticasone (FLONASE) 50 MCG/ACT nasal spray Place 2 sprays into both nostrils daily.   folic acid (FOLVITE) 1 MG tablet Take 1 mg by mouth daily.   furosemide (LASIX) 40 MG tablet TAKE 1 TABLET EVERY DAY   gabapentin (NEURONTIN) 300 MG capsule Take 2 capsules (600 mg total) by mouth 3 (three) times daily.   glucose blood (CONTOUR NEXT TEST) test strip USE TO CHECK GLUCOSE ONCE DAILY  DX: E11.9   Golimumab (SIMPONI ARIA IV) Inject into the vein. Reports receives via infusion from Rheumatology clinic   losartan (COZAAR) 50 MG tablet TAKE 1 TABLET EVERY DAY   metFORMIN (GLUCOPHAGE) 500 MG tablet TAKE 2 TABLETS EVERY MORNING AND TAKE 1 TABLET EVERY EVENING   methotrexate (RHEUMATREX) 2.5 MG tablet Take 25 mg by mouth every Friday. Caution:Chemotherapy. Protect from light.   Microlet Lancets MISC Use to check Blood sugar Once a day.  EX: E11.9   Omega-3 Fatty Acids (FISH OIL PO) Take 1 capsule by mouth in the morning. 1040 mg of fish oil in each capsule   omeprazole (PRILOSEC) 40 MG capsule Take 1 capsule by mouth once daily   predniSONE (DELTASONE) 5 MG tablet Take 5 mg by mouth daily.   rivaroxaban (XARELTO) 10 MG TABS  tablet Take 1 tablet (10 mg total) by mouth daily.   Semaglutide (RYBELSUS) 3 MG TABS Take 3 mg by mouth daily.   simvastatin (ZOCOR) 10 MG tablet TAKE 1 TABLET EVERY DAY   vitamin C (ASCORBIC ACID) 500 MG tablet Take 1,000 mg by mouth daily.    Zinc 25 MG TABS Take by mouth.   ezetimibe (ZETIA) 10 MG tablet TAKE 1 TABLET EVERY DAY (Patient not taking: Reported on 04/12/2022)   No facility-administered encounter medications on file as of 04/12/2022.    Allergies (verified) Patient has no known allergies.   History: Past Medical History:  Diagnosis Date   Allergy    Arthritis    Cancer  ([place])    endometrial   Chicken pox    Diabetes mellitus without complication (HCC)    GERD (gastroesophageal reflux disease)    Heart murmur    DUE TO RHEUMATIC FEVER PER PT   Hyperlipidemia    Hypertension    Psoriasis    Rheumatic fever    Rosacea    Past Surgical History:  Procedure Laterality Date   ABDOMINAL HYSTERECTOMY     ANTERIOR AND POSTERIOR REPAIR N/A 12/04/2016   Procedure: ANTERIOR (CYSTOCELE) AND POSTERIOR REPAIR (RECTOCELE), TRANSOBTURATOR SLING PLACEMENT(ARIS);  Surgeon: Harlin Heys, MD;  Location: [facility] ORS;  Service: Gynecology;  Laterality: N/A;   COLONOSCOPY WITH PROPOFOL N/A 05/15/2021   Procedure: COLONOSCOPY WITH PROPOFOL;  Surgeon: Lucilla Lame, MD;  Location: [facility];  Service: Endoscopy;  Laterality: N/A;   DILATION AND CURETTAGE OF UTERUS N/A 09/16/2016   Procedure: DILATATION AND CURETTAGE;  Surgeon: Harlin Heys, MD;  Location: [facility] ORS;  Service: Gynecology;  Laterality: N/A;   LAPAROSCOPIC HYSTERECTOMY Bilateral 12/04/2016   Procedure: HYSTERECTOMY TOTAL LAPAROSCOPIC WITH BILATERAL SALPINGO OOPHERECTOMY;  Surgeon: Harlin Heys, MD;  Location: [facility] ORS;  Service: Gynecology;  Laterality: Bilateral;   MOUTH SURGERY     Family History  Problem Relation Age of Onset   Cancer Mother        skin   Dementia Mother    Cancer Sister        skin and liver   Stroke Maternal Grandmother    Skin cancer Brother    Diabetes Neg Hx    Heart disease Neg Hx    Breast cancer Neg Hx    Social History   Socioeconomic History   Marital status: Married    Spouse name: Not on file   Number of children: Not on file   Years of education: Not on file   Highest education level: Not on file  Occupational History   Not on file  Tobacco Use   Smoking status: Former    Packs/day: 4.00    Years: 16.00    Total pack years: 64.00    Types: Cigarettes    Quit date: 09/12/1995    Years since quitting: 26.6   Smokeless tobacco: Never   Tobacco  comments:    quit in 1997  Vaping Use   Vaping Use: Never used  Substance and Sexual Activity   Alcohol use: No    Alcohol/week: 0.0 standard drinks of alcohol   Drug use: No   Sexual activity: Yes    Birth control/protection: None  Other Topics Concern   Not on file  Social History Narrative   Not on file   Social Determinants of Health   Financial Resource Strain: Low Risk  (04/12/2022)   Overall Financial Resource Strain (CARDIA)    Difficulty  of Paying Living Expenses: Not very hard  Food Insecurity: No Food Insecurity (04/12/2022)   Hunger Vital Sign    Worried About Running Out of Food in the Last Year: Never true    Ran Out of Food in the Last Year: Never true  Transportation Needs: No Transportation Needs (04/12/2022)   PRAPARE - Hydrologist (Medical): No    Lack of Transportation (Non-Medical): No  Physical Activity: Inactive (04/12/2022)   Exercise Vital Sign    Days of Exercise per Week: 0 days    Minutes of Exercise per Session: 0 min  Stress: No Stress Concern Present (04/12/2022)   [place]    Feeling of Stress : Not at all  Social Connections: Moderately Integrated (04/12/2022)   Social Connection and Isolation Panel [NHANES]    Frequency of Communication with Friends and Family: Three times a week    Frequency of Social Gatherings with Friends and Family: Once a week    Attends Religious Services: More than 4 times per year    Active Member of [employer] or Organizations: No    Attends Music therapist: Never    Marital Status: Married    Tobacco Counseling Counseling given: Not Answered Tobacco comments: quit in 1997   Clinical Intake:  Pre-visit preparation completed: Yes  Pain : 0-10 Pain Score: 6  Pain Location: Hip     Nutritional Risks: None Diabetes: Yes CBG done?: No Did pt. bring in CBG monitor from home?: No  How often do you  need to have someone help you when you read instructions, pamphlets, or other written materials from your doctor or pharmacy?: 1 - Never  Diabetic?yes Nutrition Risk Assessment:  Has the patient had any N/V/D within the last 2 months?  No  Does the patient have any non-healing wounds?  No  Has the patient had any unintentional weight loss or weight gain?  No   Diabetes:  Is the patient diabetic?  Yes  If diabetic, was a CBG obtained today?  No  Did the patient bring in their glucometer from home?  No  How often do you monitor your CBG's? occasionally   Financial Strains and Diabetes Management:  Are you having any financial strains with the device, your supplies or your medication? No .  Does the patient want to be seen by Chronic Care Management for management of their diabetes?  No  Would the patient like to be referred to a Nutritionist or for Diabetic Management?  No   Diabetic Exams:  Diabetic Eye Exam: Completed 11/09/21. Marland Kitchen Pt has been advised about the importance in completing this exam.  Diabetic Foot Exam: Completed 04/10/21. Pt has been advised about the importance in completing this exam.     Interpreter Needed?: No  Information entered by :: Kirke Shaggy, LPN   Activities of Daily Living    04/12/2022   11:11 AM 04/09/2022    4:39 PM  In your present state of health, do you have any difficulty performing the following activities:  Hearing? 0 0  Vision? 1 1  Difficulty concentrating or making decisions? 1 1  Walking or climbing stairs? 1 1  Dressing or bathing? 0 0  Doing errands, shopping? 0 0  Preparing Food and eating ? N N  Using the Toilet? N N  In the past six months, have you accidently leaked urine? N Y  Do you have problems with loss of  bowel control? N N  Managing your Medications? N N  Managing your Finances? N N  Housekeeping or managing your Housekeeping? N N    Patient Care Team: [name], NP as PCP - General (Internal  Medicine) Clent Jacks, RN as Registered Nurse Curley Spice, Virl Diamond, RPH-CPP as Pharmacist  Indicate any recent Medical Services you may have received from other than [facility] providers in the past year (date may be approximate).     Assessment:   This is a routine wellness examination for [place].  Hearing/Vision screen Hearing Screening - Comments:: No aids Vision Screening - Comments:: Wears glasses- Dr. Ellin Mayhew  Dietary issues and exercise activities discussed: Current Exercise Habits: The patient does not participate in regular exercise at present   Goals Addressed             This Visit's Progress    DIET - EAT MORE FRUITS AND VEGETABLES         Depression Screen    04/12/2022   11:09 AM 03/07/2022    8:36 AM 12/04/2021    9:27 AM 10/10/2021    9:59 AM 04/10/2021    9:45 AM 04/10/2021    9:43 AM 04/26/2019   10:42 AM  PHQ 2/9 Scores  PHQ - 2 Score '1 1 2 2 '$ 0 2 1  PHQ- 9 Score '2 6 4 6 '$ 0 4     Fall Risk    04/12/2022   11:11 AM 04/09/2022    4:39 PM 03/07/2022    8:36 AM 12/04/2021    9:27 AM 10/10/2021    9:58 AM  Fall Risk   Falls in the past year? 0 0 0 0 1  Number falls in past yr: 0 0 0 0 0  Injury with Fall? 0 0 0 0 0  Risk for fall due to : No Fall Risks  No Fall Risks No Fall Risks History of fall(s);No Fall Risks  Follow up Falls prevention discussed;Falls evaluation completed  Falls evaluation completed Falls evaluation completed Falls evaluation completed    FALL RISK PREVENTION PERTAINING TO THE HOME:  Any stairs in or around the home? No  If so, are there any without handrails? No  Home free of loose throw rugs in walkways, pet beds, electrical cords, etc? Yes  Adequate lighting in your home to reduce risk of falls? Yes   ASSISTIVE DEVICES UTILIZED TO PREVENT FALLS:  Life alert? No  Use of a cane, walker or w/c? No  Grab bars in the bathroom? No  Shower chair or bench in shower? No  Elevated toilet seat or a handicapped toilet? No     Cognitive Function:        04/12/2022   11:12 AM  6CIT Screen  What Year? 0 points  What month? 0 points  What time? 0 points  Count back from 20 0 points  Months in reverse 0 points  Repeat phrase 0 points  Total Score 0 points    Immunizations  There is no immunization history on file for this patient.  TDAP status: Due, Education has been provided regarding the importance of this vaccine. Advised may receive this vaccine at local pharmacy or Health Dept. Aware to provide a copy of the vaccination record if obtained from local pharmacy or Health Dept. Verbalized acceptance and understanding.  Flu Vaccine status: Declined, Education has been provided regarding the importance of this vaccine but patient still declined. Advised may receive this vaccine at local pharmacy or Health Dept.  Aware to provide a copy of the vaccination record if obtained from local pharmacy or Health Dept. Verbalized acceptance and understanding.  Pneumococcal vaccine status: Declined,  Education has been provided regarding the importance of this vaccine but patient still declined. Advised may receive this vaccine at local pharmacy or Health Dept. Aware to provide a copy of the vaccination record if obtained from local pharmacy or Health Dept. Verbalized acceptance and understanding.   Covid-19 vaccine status: Declined, Education has been provided regarding the importance of this vaccine but patient still declined. Advised may receive this vaccine at local pharmacy or Health Dept.or vaccine clinic. Aware to provide a copy of the vaccination record if obtained from local pharmacy or Health Dept. Verbalized acceptance and understanding.  Qualifies for Shingles Vaccine? Yes   Zostavax completed No   Shingrix Completed?: No.    Education has been provided regarding the importance of this vaccine. Patient has been advised to call insurance company to determine out of pocket expense if they have not yet received  this vaccine. Advised may also receive vaccine at local pharmacy or Health Dept. Verbalized acceptance and understanding.  Screening Tests Health Maintenance  Topic Date Due   COVID-19 Vaccine (1) Never done   DTaP/Tdap/Td (1 - Tdap) Never done   Zoster Vaccines- Shingrix (1 of 2) Never done   Pneumonia Vaccine 34+ Years old (1 - PCV) Never done   FOOT EXAM  04/10/2022   INFLUENZA VACCINE  08/11/2022 (Originally 12/11/2021)   HEMOGLOBIN A1C  09/06/2022   MAMMOGRAM  09/14/2022   Diabetic kidney evaluation - Urine ACR  10/20/2022   OPHTHALMOLOGY EXAM  11/10/2022   Diabetic kidney evaluation - GFR measurement  03/08/2023   Medicare Annual Wellness (AWV)  04/13/2023   COLONOSCOPY (Pts 45-79yr Insurance coverage will need to be confirmed)  05/15/2028   DEXA SCAN  Completed   Hepatitis C Screening  Completed   HPV VACCINES  Aged Out    Health Maintenance  Health Maintenance Due  Topic Date Due   COVID-19 Vaccine (1) Never done   DTaP/Tdap/Td (1 - Tdap) Never done   Zoster Vaccines- Shingrix (1 of 2) Never done   Pneumonia Vaccine 66 Years old (1 - PCV) Never done   FOOT EXAM  04/10/2022    Colorectal cancer screening: Type of screening: Colonoscopy. Completed 05/15/21. Repeat every 7 years  Mammogram status: Completed 09/13/21. Repeat every year  Bone Density status: Completed 05/19/18. Results reflect: Bone density results: NORMAL. Repeat every 5 years.  Lung Cancer Screening: (Low Dose CT Chest recommended if Age 66-80years, 30 pack-year currently smoking OR have quit w/in 15years.) does not qualify.   Additional Screening:  Hepatitis C Screening: does qualify; Completed 10/29/16  Vision Screening: Recommended annual ophthalmology exams for early detection of glaucoma and other disorders of the eye. Is the patient up to date with their annual eye exam?  Yes  Who is the provider or what is the name of the office in which the patient attends annual eye exams? Dr.Woodard If pt is  not established with a provider, would they like to be referred to a provider to establish care? No .   Dental Screening: Recommended annual dental exams for proper oral hygiene  Community Resource Referral / Chronic Care Management: CRR required this visit?  No   CCM required this visit?  No      Plan:     I have personally reviewed and noted the following in the patient's chart:  Medical and social history Use of alcohol, tobacco or illicit drugs  Current medications and supplements including opioid prescriptions. Patient is not currently taking opioid prescriptions. Functional ability and status Nutritional status Physical activity Advanced directives List of other physicians Hospitalizations, surgeries, and ER visits in previous 12 months Vitals Screenings to include cognitive, depression, and falls Referrals and appointments  In addition, I have reviewed and discussed with patient certain preventive protocols, quality metrics, and best practice recommendations. A written personalized care plan for preventive services as well as general preventive health recommendations were provided to patient.     Dionisio David, LPN   03/18/5789   Nurse Notes: none

## 2022-04-14 ENCOUNTER — Other Ambulatory Visit: Payer: Self-pay | Admitting: Internal Medicine

## 2022-04-15 NOTE — Telephone Encounter (Signed)
Requested Prescriptions  Pending Prescriptions Disp Refills   XARELTO 10 MG TABS tablet [Pharmacy Med Name: XARELTO 10 MG Tablet] 90 tablet 3    Sig: TAKE 1 TABLET EVERY DAY     Hematology: Anticoagulants - rivaroxaban Passed - 04/14/2022  3:57 AM      Passed - ALT in normal range and within 360 days    ALT  Date Value Ref Range Status  03/07/2022 18 0 - 32 IU/L Final         Passed - AST in normal range and within 360 days    AST  Date Value Ref Range Status  03/07/2022 15 0 - 40 IU/L Final         Passed - Cr in normal range and within 360 days    Creat  Date Value Ref Range Status  04/10/2021 0.93 0.50 - 1.05 mg/dL Final   Creatinine, Ser  Date Value Ref Range Status  03/07/2022 0.93 0.57 - 1.00 mg/dL Final   Creatinine,U  Date Value Ref Range Status  10/29/2016 153.4 mg/dL Final         Passed - HCT in normal range and within 360 days    Hematocrit  Date Value Ref Range Status  03/07/2022 38.0 34.0 - 46.6 % Final         Passed - HGB in normal range and within 360 days    Hemoglobin  Date Value Ref Range Status  03/07/2022 12.7 11.1 - 15.9 g/dL Final         Passed - PLT in normal range and within 360 days    Platelets  Date Value Ref Range Status  03/07/2022 298 150 - 450 x10E3/uL Final         Passed - eGFR is 15 or above and within 360 days    GFR calc Af Amer  Date Value Ref Range Status  04/30/2019 >60 >60 mL/min Final   GFR calc non Af Amer  Date Value Ref Range Status  04/30/2019 >60 >60 mL/min Final   GFR  Date Value Ref Range Status  12/21/2019 53.40 (L) >60.00 mL/min Final   eGFR  Date Value Ref Range Status  03/07/2022 68 >59 mL/min/1.73 Final         Passed - Patient is not pregnant      Passed - Valid encounter within last 12 months    Recent Outpatient Visits           1 month ago Diabetes 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, unspecified whether long term insulin use ([place])   [facility] [place], Coralie Keens, NP   4 months ago Thyroid nodule   [facility] [place], Coralie Keens, NP   6 months ago History of DVT (deep vein thrombosis)   [facility] [name], NP   1 year ago Welcome to [employer] preventive visit   [place], Coralie Keens, NP   1 year ago Diabetes 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, unspecified whether long term insulin use ([place])   [facility] [place], Coralie Keens, NP       Future Appointments             In 1 month [place], Coralie Keens, NP [facility], [facility]

## 2022-05-22 ENCOUNTER — Ambulatory Visit: Payer: Medicare Other | Admitting: Pharmacist

## 2022-05-22 DIAGNOSIS — E0843 Diabetes 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 autonomic (poly)neuropathy: Secondary | ICD-10-CM

## 2022-05-22 NOTE — Patient Instructions (Signed)
Goals Addressed             This Visit's Progress    Pharmacy Goals       Our goal A1c is less than 7%. This corresponds with fasting sugars less than 130 and 2 hour after meal sugars less than 180. Please keep a log when you check your blood sugar  Our goal bad cholesterol, or LDL, is less than 70 . This is why it is important to continue taking your simvastatin and ezetimibe.  Please check your home blood pressure, keep a log of the results and bring this with you to your medical appointments.  Feel free to call me with any questions or concerns. I look forward to our next call!  Wallace Cullens, PharmD, [place] [phone]

## 2022-05-22 NOTE — Progress Notes (Signed)
05/22/2022 Name: Melissa Cabrera MRN: [id number] DOB: Dec 19, 1955  Chief Complaint  Patient presents with   Medication Management    T2DM    Melissa Cabrera is a 67 y.o. year old female who presented for a telephone visit.   They were referred to the pharmacist by their PCP for assistance in managing diabetes, hypertension, and hyperlipidemia.   Today's visit limited as patient reports that she is not currently home and requests to reschedule the remainder of our appointment  Subjective:  Care Team: Primary Care Provider: [name], NP ; Next Scheduled Visit: 06/07/2022 Hematology: Earlie Server, MD   Medication Access/Adherence  Current Pharmacy:  [facility] [address]), [place] [address] [place]) [place] [address] Phone: [phone] Fax: [phone]  [place], [address] [place] [address] Phone: [phone] Fax: [phone]   Patient reports affordability concerns with their medications: No  Patient reports access/transportation concerns to their pharmacy: No  Patient reports adherence concerns with their medications:  No     Diabetes:   Current medications:  - Metformin 500 mg - 2 tablets (1000 mg) with breakfast and 1 tablet (500 mg) with supper  - Rybelsus 3 mg daily Confirms taking on an empty stomach, ?30 minutes before the first food, beverage, or other oral medications of the day with ?4 oz of plain water only - Note patient on prednisone 5 mg daily from Rheumatology   Medications tried in the past: glipizide ER  Unable to review specific readings as patient not currently home, but recalls last checked recently 3-hour after breakfast, reading: 109  Reports now tolerating Rybelsus 3 mg daily well and interested in discussing increase of this dose during our next phone call  Current medication access support: approved for Full  Extra Help subsidy as of 08/2021    Health Maintenance  Health Maintenance Due  Topic Date Due   COVID-19 Vaccine (1) Never done   DTaP/Tdap/Td (1 - Tdap) Never done   Zoster Vaccines- Shingrix (1 of 2) Never done   Pneumonia Vaccine 54+ Years old (1 - PCV) Never done   FOOT EXAM  04/10/2022     Objective: Lab Results  Component Value Date   HGBA1C 7.6 (A) 03/07/2022    Lab Results  Component Value Date   CREATININE 0.93 03/07/2022   BUN 18 03/07/2022   NA 142 03/07/2022   K 4.3 03/07/2022   CL 101 03/07/2022   CO2 26 03/07/2022    Lab Results  Component Value Date   CHOL 153 03/07/2022   HDL 55 03/07/2022   LDLCALC 53 03/07/2022   LDLDIRECT 142.0 05/16/2014   TRIG 290 (H) 03/07/2022   CHOLHDL 2.8 03/07/2022    Medications Reviewed Today     Reviewed by Dionisio David, LPN (Licensed Practical Nurse) on 04/12/22 at 1120  Med List Status: <None>   Medication Order Taking? Sig Documenting Provider Last Dose Status Informant  acetaminophen (TYLENOL) 650 MG CR tablet [id number] Yes Take 1,300 mg by mouth daily. [provider] Taking Active   cetirizine (ZYRTEC) 10 MG tablet [id number] Yes Take 10 mg by mouth at bedtime.  [provider] Taking Active Self  Cholecalciferol 25 MCG (1000 UT) tablet [id number] Yes Take 1,000 Units by mouth daily. [provider] Taking Active   diphenhydrAMINE (BENADRYL) 25 mg capsule [id number] Yes Take 50 mg by mouth at bedtime.  [provider] Taking Active Self  doxycycline (ADOXA) 100 MG tablet [id number] Yes Take 50 mg by mouth daily. [provider] Taking Active   ezetimibe (ZETIA) 10 MG tablet [id number] No TAKE 1 TABLET EVERY DAY  Patient not taking: Reported on 04/12/2022   [name], NP Not Taking Active   fluticasone (FLONASE) 50 MCG/ACT nasal spray [id number] Yes Place 2 sprays into both nostrils daily. [name], NP Taking Active   folic acid (FOLVITE) 1 MG tablet  [id number] Yes Take 1 mg by mouth daily. [provider] Taking Active Self           Med Note Winfield Cunas, [facility]   Fri Oct 06, 2020  9:32 AM) Reports takes 3 tablets daily on Friday & Saturday and 1 tablet daily Sunday-Thursday as directed by Rheumatology  furosemide (LASIX) 40 MG tablet [id number] Yes TAKE 1 TABLET EVERY DAY Baity, Coralie Keens, NP Taking Active   gabapentin (NEURONTIN) 300 MG capsule [id number] Yes Take 2 capsules (600 mg total) by mouth 3 (three) times daily. [name], NP Taking Active   glucose blood (CONTOUR NEXT TEST) test strip [id number] Yes USE TO CHECK GLUCOSE ONCE DAILY  DX: E11.9 [name], NP Taking Active   Golimumab [facility] ARIA IV) [id number] Yes Inject into the vein. Reports receives via infusion from Rheumatology clinic [provider] Taking Active   losartan (COZAAR) 50 MG tablet [id number] Yes TAKE 1 TABLET EVERY DAY [name], NP Taking Active   metFORMIN (GLUCOPHAGE) 500 MG tablet [id number] Yes TAKE 2 TABLETS EVERY MORNING AND TAKE 1 TABLET EVERY EVENING Baity, Coralie Keens, NP Taking Active   methotrexate (RHEUMATREX) 2.5 MG tablet [id number] Yes Take 25 mg by mouth every Friday. Caution:Chemotherapy. Protect from light. [provider] Taking Active Self           Med Note Channing Mutters, JENNIFER   Thu Nov 21, 2016 11:23 AM) PT AWARE TO STOP PRIOR TO PROCEDURE   Microlet Lancets [place] [id number] Yes Use to check Blood sugar Once a day.  EX: E11.9 [name], NP Taking Active   Omega-3 Fatty Acids (FISH OIL PO) [id number] Yes Take 1 capsule by mouth in the morning. 1040 mg of fish oil in each capsule [provider] Taking Active Self  omeprazole (PRILOSEC) 40 MG capsule [id number] Yes Take 1 capsule by mouth once daily [name], NP Taking Active   predniSONE (DELTASONE) 5 MG tablet [id number] Yes Take 5 mg by mouth daily. [provider] Taking Active   rivaroxaban (XARELTO) 10 MG TABS tablet [id number] Yes  Take 1 tablet (10 mg total) by mouth daily. [name], NP Taking Active   Semaglutide (RYBELSUS) 3 MG TABS [id number] Yes Take 3 mg by mouth daily. [name], NP Taking Active   simvastatin (ZOCOR) 10 MG tablet [id number] Yes TAKE 1 TABLET EVERY DAY Baity, Coralie Keens, NP Taking Active   vitamin C (ASCORBIC ACID) 500 MG tablet [id number] Yes Take 1,000 mg by mouth daily.  [provider] Taking Active   Zinc 25 MG TABS [id number] Yes Take by mouth. [provider] Taking Active               Assessment/Plan:   Diabetes: - Patient to monitor home blood sugar, using results as feed back for dietary choices, keeping log of results and to have this record to review during future medical appointments  - Today's visit limited as patient reports that  she is not currently home and requests to reschedule the remainder of our appointment  Reschedule appointment as requested    Follow Up Plan: Clinical Pharmacist will follow up with patient by telephone again on 05/24/2022 at 8:30 AM   Wallace Cullens, PharmD, [place] [phone]

## 2022-05-24 ENCOUNTER — Ambulatory Visit: Payer: Medicare Other | Admitting: Pharmacist

## 2022-05-24 ENCOUNTER — Other Ambulatory Visit: Payer: Self-pay | Admitting: Internal Medicine

## 2022-05-24 DIAGNOSIS — I10 Essential (primary) hypertension: Secondary | ICD-10-CM

## 2022-05-24 DIAGNOSIS — E782 Mixed hyperlipidemia: Secondary | ICD-10-CM

## 2022-05-24 DIAGNOSIS — E0843 Diabetes 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 autonomic (poly)neuropathy: Secondary | ICD-10-CM

## 2022-05-24 MED ORDER — ROSUVASTATIN CALCIUM 10 MG PO TABS: 10.0000 mg | ORAL_TABLET | Freq: Every day | ORAL | 0 refills | Status: DC

## 2022-05-24 MED ORDER — RYBELSUS 7 MG PO TABS: 7.0000 mg | ORAL_TABLET | Freq: Every day | ORAL | 0 refills | Status: DC

## 2022-05-24 NOTE — Patient Instructions (Signed)
Goals Addressed             This Visit's Progress    Pharmacy Goals       Our goal A1c is less than 7%. This corresponds with fasting sugars less than 130 and 2 hour after meal sugars less than 180. Please keep a log when you check your blood sugar  Our goal bad cholesterol, or LDL, is less than 70 . This is why it is important to continue taking your simvastatin and ezetimibe.  Please check your home blood pressure, keep a log of the results and bring this with you to your medical appointments.  Feel free to call me with any questions or concerns. I look forward to our next call!   Wallace Cullens, PharmD, [place] [phone]

## 2022-05-24 NOTE — Progress Notes (Signed)
05/24/2022 Name: Melissa Cabrera MRN: [id number] DOB: 1956/03/03  Chief Complaint  Patient presents with   Medication Management   Melissa Cabrera is a 67 y.o. year old female who presented for a telephone visit.   They were referred to the pharmacist by their PCP for assistance in managing diabetes, hypertension, and hyperlipidemia.      Subjective:   Care Team: Primary Care Provider: [name], NP ; Next Scheduled Visit: 06/07/2022 Hematology: Earlie Server, MD    Medication Access/Adherence   Current Pharmacy:  [facility] [address]), [place] [address] Phone: [phone] Fax: [phone]   [place], [place] [address] Phone: [phone] Fax: [phone]     Patient reports affordability concerns with their medications: No  Patient reports access/transportation concerns to their pharmacy: No  Patient reports adherence concerns with their medications:  No       Diabetes:     Current medications:  - Metformin 500 mg - 2 tablets (1000 mg) with breakfast and 1 tablet (500 mg) with supper  - Rybelsus 3 mg daily Confirms taking on an empty stomach, ?30 minutes before the first food, beverage, or other oral medications of the day with ?4 oz of plain water only - Note patient on prednisone 5 mg daily from Rheumatology    Medications tried in the past: glipizide ER   Recent fasting morning blood sugar ranging 101-114   Reports working on losing weight, current home weight: ~226 lbs Reports now tolerating Rybelsus 3 mg daily well and interested in increasing this dose for impact on weight loss  Reports having regular well-balanced meals, while limiting carbohydrate portion sizes  Exercise: limited by foot and leg pain making walking painful - Again discussed option to follow up with [employer] as patient has  reported she enjoys swimming   Current medication access support: approved for Full Extra Help subsidy as of 08/2021     Hyperlipidemia/ASCVD Risk Reduction  Current lipid lowering medications: simvastatin 10 mg daily; ezetimibe 10 mg daily  Current physical activity: Exercise: limited by foot and leg pain making walking painful - Again discussed option to follow up with [employer] as patient has reported she enjoys swimming    Hypertension:  Current medications: losartan 50 mg daily; furosemide 40 mg daily  Patient has an automated, upper arm home BP cuff Current blood pressure readings readings: last checked a couple of weeks ago, reading: 117/76   Patient denies hypotensive s/sx including dizziness, lightheadedness.    Current physical activity: Exercise: limited by foot and leg pain making walking painful - Again discussed option to follow up with [employer] as patient has reported she enjoys swimming      Objective:  Lab Results  Component Value Date   HGBA1C 7.6 (A) 03/07/2022    Lab Results  Component Value Date   CREATININE 0.93 03/07/2022   BUN 18 03/07/2022   NA 142 03/07/2022   K 4.3 03/07/2022   CL 101 03/07/2022   CO2 26 03/07/2022    Lab Results  Component Value Date   CHOL 153 03/07/2022   HDL 55 03/07/2022   LDLCALC 53 03/07/2022   LDLDIRECT 142.0 05/16/2014   TRIG 290 (H) 03/07/2022   CHOLHDL 2.8 03/07/2022   BP Readings from Last 3 Encounters:  03/07/22 132/88  12/04/21 124/62  10/10/21 116/78   Pulse Readings  from Last 3 Encounters:  03/07/22 69  12/04/21 68  10/10/21 72    Medications Reviewed Today     Reviewed by Rennis Petty, RPH-CPP (Pharmacist) on 05/24/22 at [phone]  Med List Status: <None>   Medication Order Taking? Sig Documenting Provider Last Dose Status Informant  acetaminophen (TYLENOL) 650 MG CR tablet [id number] Yes Take 1,300 mg by mouth daily. [provider] Taking Active   cetirizine (ZYRTEC) 10 MG tablet  [id number] Yes Take 10 mg by mouth at bedtime.  [provider] Taking Active Self  Cholecalciferol 25 MCG (1000 UT) tablet [id number] Yes Take 1,000 Units by mouth daily. [provider] Taking Active   diphenhydrAMINE (BENADRYL) 25 mg capsule [id number] Yes Take 50 mg by mouth at bedtime.  [provider] Taking Active Self  doxycycline (ADOXA) 100 MG tablet [id number] Yes Take 50 mg by mouth daily. [provider] Taking Active   ezetimibe (ZETIA) 10 MG tablet [id number] Yes TAKE 1 TABLET EVERY DAY Baity, Coralie Keens, NP Taking Active   fluticasone (FLONASE) 50 MCG/ACT nasal spray [id number] Yes Place 2 sprays into both nostrils daily. [name], NP Taking Active   folic acid (FOLVITE) 1 MG tablet [id number] Yes Take 1 mg by mouth daily. [provider] Taking Active Self           Med Note Winfield Cunas, [facility] A   Fri Oct 06, 2020  9:32 AM) Reports takes 3 tablets daily on Friday & Saturday and 1 tablet daily Sunday-Thursday as directed by Rheumatology  furosemide (LASIX) 40 MG tablet [id number] Yes TAKE 1 TABLET EVERY DAY Baity, Coralie Keens, NP Taking Active   gabapentin (NEURONTIN) 300 MG capsule [id number] Yes Take 2 capsules (600 mg total) by mouth 3 (three) times daily. [name], NP Taking Active   glucose blood (CONTOUR NEXT TEST) test strip [id number]  USE TO CHECK GLUCOSE ONCE DAILY  DX: E11.9 [name], NP  Active   Golimumab [facility] ARIA IV) [id number] Yes Inject into the vein. Reports receives via infusion from Rheumatology clinic [provider] Taking Active   losartan (COZAAR) 50 MG tablet [id number] Yes TAKE 1 TABLET EVERY DAY [name], NP Taking Active   metFORMIN (GLUCOPHAGE) 500 MG tablet [id number] Yes TAKE 2 TABLETS EVERY MORNING AND TAKE 1 TABLET EVERY EVENING Baity, Coralie Keens, NP Taking Active   methotrexate (RHEUMATREX) 2.5 MG tablet [id number] Yes Take 25 mg by mouth every Friday. Caution:Chemotherapy. Protect from  light. [provider] Taking Active Self           Med Note Channing Mutters, JENNIFER   Thu Nov 21, 2016 11:23 AM) PT AWARE TO STOP PRIOR TO PROCEDURE   Microlet Lancets MISC [id number]  Use to check Blood sugar Once a day.  EX: E11.9 [name], NP  Active   Omega-3 Fatty Acids (FISH OIL PO) [id number] Yes Take 1 capsule by mouth in the morning. 1040 mg of fish oil in each capsule [provider] Taking Active Self  omeprazole (PRILOSEC) 40 MG capsule [id number] Yes Take 1 capsule by mouth once daily [name], NP Taking Active   predniSONE (DELTASONE) 5 MG tablet [id number] Yes Take 5 mg by mouth daily. [provider] Taking Active   rivaroxaban (XARELTO) 10 MG TABS tablet [id number] Yes TAKE 1 TABLET EVERY DAY Baity, Coralie Keens, NP Taking Active   Semaglutide (RYBELSUS) 3 MG TABS [id number] Yes Take 3 mg by mouth daily. [name], NP Taking  Active   simvastatin (ZOCOR) 10 MG tablet [id number] Yes TAKE 1 TABLET EVERY DAY Baity, Coralie Keens, NP Taking Active   vitamin C (ASCORBIC ACID) 500 MG tablet [id number] Yes Take 1,000 mg by mouth daily.  [provider] Taking Active   Zinc 25 MG TABS [id number] Yes Take by mouth. [provider] Taking Active               Assessment/Plan:   Diabetes: - Encourage patient to have regular well-balanced meals, while controlling carbohydrate portion sizes - Encourage patient to monitor home blood sugar, using results as feedback for dietary choices, keeping log of results and to have this record to review during future medical appointments  - Will collaborate with PCP as patient expressing interest in increasing Rybelsus dose to aid with glycemic control/weight loss.  Hypertension: - Currently controlled - Encourage patient to continue to monitor home blood pressure, keep log of the results and have this record to review during future medical appointments    Hyperlipidemia/ASCVD Risk Reduction: -  Currently uncontrolled.  - Discussed impact of exercise on lipid-control - Will collaborate with PCP regarding medication management   Follow Up Plan: Clinical Pharmacist will follow up with patient by telephone again on 11/22/2022 at 8:30 am   Wallace Cullens, PharmD, [facility] [phone]

## 2022-06-07 ENCOUNTER — Ambulatory Visit (INDEPENDENT_AMBULATORY_CARE_PROVIDER_SITE_OTHER): Payer: Medicare Other | Admitting: Internal Medicine

## 2022-06-07 ENCOUNTER — Encounter: Payer: Self-pay | Admitting: Internal Medicine

## 2022-06-07 VITALS — BP 126/82 | HR 86 | Temp 96.8°F | Wt 230.0 lb

## 2022-06-07 DIAGNOSIS — F411 Generalized anxiety disorder: Secondary | ICD-10-CM | POA: Insufficient documentation

## 2022-06-07 DIAGNOSIS — E785 Hyperlipidemia, unspecified: Secondary | ICD-10-CM

## 2022-06-07 DIAGNOSIS — T7840XA Allergy, unspecified, initial encounter: Secondary | ICD-10-CM

## 2022-06-07 DIAGNOSIS — I5032 Chronic diastolic (congestive) heart failure: Secondary | ICD-10-CM | POA: Diagnosis not present

## 2022-06-07 DIAGNOSIS — I10 Essential (primary) hypertension: Secondary | ICD-10-CM

## 2022-06-07 DIAGNOSIS — E1143 Type 2 diabetes mellitus with diabetic autonomic (poly)neuropathy: Secondary | ICD-10-CM

## 2022-06-07 DIAGNOSIS — E119 Type 2 diabetes mellitus without complications: Secondary | ICD-10-CM

## 2022-06-07 DIAGNOSIS — Z6841 Body Mass Index (BMI) 40.0 and over, adult: Secondary | ICD-10-CM

## 2022-06-07 DIAGNOSIS — E0843 Diabetes 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 autonomic (poly)neuropathy: Secondary | ICD-10-CM

## 2022-06-07 DIAGNOSIS — E1169 Type 2 diabetes mellitus with other specified complication: Secondary | ICD-10-CM

## 2022-06-07 LAB — POCT GLYCOSYLATED HEMOGLOBIN (HGB A1C): Hemoglobin A1C: 6.6 % — AB (ref 4.0–5.6)

## 2022-06-07 MED ORDER — BUSPIRONE HCL 5 MG PO TABS: 5.0000 mg | ORAL_TABLET | Freq: Every day | ORAL | 0 refills | Status: DC | PRN

## 2022-06-07 NOTE — Patient Instructions (Signed)
Calorie Counting for Weight Loss Calories are units of energy. Your body needs a certain number of calories from food to keep going throughout the day. When you eat or drink more calories than your body needs, your body stores the extra calories mostly as fat. When you eat or drink fewer calories than your body needs, your body burns fat to get the energy it needs. Calorie counting means keeping track of how many calories you eat and drink each day. Calorie counting can be helpful if you need to lose weight. If you eat fewer calories than your body needs, you should lose weight. Ask your health care provider what a healthy weight is for you. For calorie counting to work, you will need to eat the right number of calories each day to lose a healthy amount of weight per week. A dietitian can help you figure out how many calories you need in a day and will suggest ways to reach your calorie goal. A healthy amount of weight to lose each week is usually 1-2 lb (0.5-0.9 kg). This usually means that your daily calorie intake should be reduced by 500-750 calories. Eating 1,200-1,500 calories a day can help most women lose weight. Eating 1,500-1,800 calories a day can help most men lose weight. What do I need to know about calorie counting? Work with your health care provider or dietitian to determine how many calories you should get each day. To meet your daily calorie goal, you will need to: Find out how many calories are in each food that you would like to eat. Try to do this before you eat. Decide how much of the food you plan to eat. Keep a food log. Do this by writing down what you ate and how many calories it had. To successfully lose weight, it is important to balance calorie counting with a healthy lifestyle that includes regular activity. Where do I find calorie information?  The number of calories in a food can be found on a Nutrition Facts label. If a food does not have a Nutrition Facts label, try  to look up the calories online or ask your dietitian for help. Remember that calories are listed per serving. If you choose to have more than one serving of a food, you will have to multiply the calories per serving by the number of servings you plan to eat. For example, the label on a package of bread might say that a serving size is 1 slice and that there are 90 calories in a serving. If you eat 1 slice, you will have eaten 90 calories. If you eat 2 slices, you will have eaten 180 calories. How do I keep a food log? After each time that you eat, record the following in your food log as soon as possible: What you ate. Be sure to include toppings, sauces, and other extras on the food. How much you ate. This can be measured in cups, ounces, or number of items. How many calories were in each food and drink. The total number of calories in the food you ate. Keep your food log near you, such as in a pocket-sized notebook or on an app or website on your mobile phone. Some programs will calculate calories for you and show you how many calories you have left to meet your daily goal. What are some portion-control tips? Know how many calories are in a serving. This will help you know how many servings you can have of a certain   food. Use a measuring cup to measure serving sizes. You could also try weighing out portions on a kitchen scale. With time, you will be able to estimate serving sizes for some foods. Take time to put servings of different foods on your favorite plates or in your favorite bowls and cups so you know what a serving looks like. Try not to eat straight from a food's packaging, such as from a bag or box. Eating straight from the package makes it hard to see how much you are eating and can lead to overeating. Put the amount you would like to eat in a cup or on a plate to make sure you are eating the right portion. Use smaller plates, glasses, and bowls for smaller portions and to prevent  overeating. Try not to multitask. For example, avoid watching TV or using your computer while eating. If it is time to eat, sit down at a table and enjoy your food. This will help you recognize when you are full. It will also help you be more mindful of what and how much you are eating. What are tips for following this plan? Reading food labels Check the calorie count compared with the serving size. The serving size may be smaller than what you are used to eating. Check the source of the calories. Try to choose foods that are high in protein, fiber, and vitamins, and low in saturated fat, trans fat, and sodium. Shopping Read nutrition labels while you shop. This will help you make healthy decisions about which foods to buy. Pay attention to nutrition labels for low-fat or fat-free foods. These foods sometimes have the same number of calories or more calories than the full-fat versions. They also often have added sugar, starch, or salt to make up for flavor that was removed with the fat. Make a grocery list of lower-calorie foods and stick to it. Cooking Try to cook your favorite foods in a healthier way. For example, try baking instead of frying. Use low-fat dairy products. Meal planning Use more fruits and vegetables. One-half of your plate should be fruits and vegetables. Include lean proteins, such as chicken, turkey, and fish. Lifestyle Each week, aim to do one of the following: 150 minutes of moderate exercise, such as walking. 75 minutes of vigorous exercise, such as running. General information Know how many calories are in the foods you eat most often. This will help you calculate calorie counts faster. Find a way of tracking calories that works for you. Get creative. Try different apps or programs if writing down calories does not work for you. What foods should I eat?  Eat nutritious foods. It is better to have a nutritious, high-calorie food, such as an avocado, than a food with  few nutrients, such as a bag of potato chips. Use your calories on foods and drinks that will fill you up and will not leave you hungry soon after eating. Examples of foods that fill you up are nuts and nut butters, vegetables, lean proteins, and high-fiber foods such as whole grains. High-fiber foods are foods with more than 5 g of fiber per serving. Pay attention to calories in drinks. Low-calorie drinks include water and unsweetened drinks. The items listed above may not be a complete list of foods and beverages you can eat. Contact a dietitian for more information. What foods should I limit? Limit foods or drinks that are not good sources of vitamins, minerals, or protein or that are high in unhealthy fats. These   include: Candy. Other sweets. Sodas, specialty coffee drinks, alcohol, and juice. The items listed above may not be a complete list of foods and beverages you should avoid. Contact a dietitian for more information. How do I count calories when eating out? Pay attention to portions. Often, portions are much larger when eating out. Try these tips to keep portions smaller: Consider sharing a meal instead of getting your own. If you get your own meal, eat only half of it. Before you start eating, ask for a container and put half of your meal into it. When available, consider ordering smaller portions from the menu instead of full portions. Pay attention to your food and drink choices. Knowing the way food is cooked and what is included with the meal can help you eat fewer calories. If calories are listed on the menu, choose the lower-calorie options. Choose dishes that include vegetables, fruits, whole grains, low-fat dairy products, and lean proteins. Choose items that are boiled, broiled, grilled, or steamed. Avoid items that are buttered, battered, fried, or served with cream sauce. Items labeled as crispy are usually fried, unless stated otherwise. Choose water, low-fat milk,  unsweetened iced tea, or other drinks without added sugar. If you want an alcoholic beverage, choose a lower-calorie option, such as a glass of wine or light beer. Ask for dressings, sauces, and syrups on the side. These are usually high in calories, so you should limit the amount you eat. If you want a salad, choose a garden salad and ask for grilled meats. Avoid extra toppings such as bacon, cheese, or fried items. Ask for the dressing on the side, or ask for olive oil and vinegar or lemon to use as dressing. Estimate how many servings of a food you are given. Knowing serving sizes will help you be aware of how much food you are eating at restaurants. Where to find more information Centers for Disease Control and Prevention: www.cdc.gov U.S. Department of Agriculture: myplate.gov Summary Calorie counting means keeping track of how many calories you eat and drink each day. If you eat fewer calories than your body needs, you should lose weight. A healthy amount of weight to lose per week is usually 1-2 lb (0.5-0.9 kg). This usually means reducing your daily calorie intake by 500-750 calories. The number of calories in a food can be found on a Nutrition Facts label. If a food does not have a Nutrition Facts label, try to look up the calories online or ask your dietitian for help. Use smaller plates, glasses, and bowls for smaller portions and to prevent overeating. Use your calories on foods and drinks that will fill you up and not leave you hungry shortly after a meal. This information is not intended to replace advice given to you by your health care provider. Make sure you discuss any questions you have with your health care provider. Document Revised: 06/10/2019 Document Reviewed: 06/10/2019 Elsevier Patient Education  2023 Elsevier Inc.  

## 2022-06-07 NOTE — Assessment & Plan Note (Signed)
POCT A1c 6.6% Urine microalbumin has been checked within the last year She will continue metformin and gabapentin She plans to increase Rybelsus to 7 mg at her next refill Encouraged routine eye exam Encouraged routine foot exam She declines immunizations

## 2022-06-07 NOTE — Assessment & Plan Note (Signed)
C-Met and lipid profile today Encouraged her to consume low-fat diet She plans to switch from simvastatin to rosuvastatin at her next refill Continue ezetimibe and fish oil

## 2022-06-07 NOTE — Assessment & Plan Note (Signed)
Situational We will trial buspirone 5 mg daily as needed Support offered

## 2022-06-07 NOTE — Assessment & Plan Note (Signed)
Controlled on losartan and furosemide C-Met today Reinforced DASH diet and exercise for weight loss

## 2022-06-07 NOTE — Assessment & Plan Note (Signed)
Encourage diet and exercise for weight loss 

## 2022-06-07 NOTE — Progress Notes (Signed)
Subjective:    Patient ID: Melissa Cabrera, female    DOB: 07/08/1955, 67 y.o.   MRN: [id number]  HPI  Patient presents to clinic today for 30-monthfollow-up of chronic conditions.  CHF, diastolic: She reports lower extremity edema and shortness of breath but denies chronic cough.  She is taking Losartan and Furosemide as prescribed.  Echo from 07/2017 reviewed.  She does not follow with cardiology.  HTN: Her BP today is 126/82.  She is taking Losartan and Furosemide as prescribed.  ECG from 11/2018 reviewed.  HLD: Her last LDL was 53, triglycerides 290, 02/2022.  She denies myalgias on Simvastatin, Ezetimibe and Fish Oil.  She plans to switch to Rosuvastatin once she completes her Simvastatin prescription. She does not consume low-fat diet.  DM2 with Peripheral Neuropathy: Her last A1c was 7.6%, 02/2022.  She is taking Metformin, Rybelsus and Gabapentin as prescribed.  She does not check her sugars.  She checks her feet routinely.  Her last eye exam was 10/2021.  Flu never.  Pneumovax never.  Prevnar never.  COVID never.  She also reports increased anxiety secondary to family issues. She has been feeling overwhelmed and feels like this has been affecting her memory. She is wondering if there is something she can take as needed for this. She denies depression, SI/HI.  She would also like to be tested for mold allergies.  She reports she has mold in her home and causes intermittent symptoms.  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      cetirizine (ZYRTEC) 10 MG tablet Take 10 mg by mouth at bedtime.      Cholecalciferol 25 MCG (1000 UT) tablet Take 1,000 Units by mouth daily.     diphenhydrAMINE (BENADRYL) 25 mg capsule Take 50 mg by mouth at bedtime.      doxycycline (ADOXA) 100 MG tablet Take 50 mg by mouth daily.     ezetimibe (ZETIA) 10 MG tablet TAKE 1 TABLET EVERY DAY 90 tablet 2   fluticasone (FLONASE) 50 MCG/ACT nasal spray Place 2 sprays into both nostrils daily. 16 g 6   folic acid (FOLVITE) 1 MG tablet Take 1 mg by mouth daily.     furosemide (LASIX) 40 MG tablet TAKE 1 TABLET EVERY DAY 90 tablet 1   gabapentin (NEURONTIN) 300 MG capsule Take 2 capsules (600 mg total) by mouth 3 (three) times daily. 540 capsule 1   glucose blood (CONTOUR NEXT TEST) test strip USE TO CHECK GLUCOSE ONCE DAILY  DX: E11.9 100 each 1   Golimumab (SIMPONI ARIA IV) Inject into the vein. Reports receives via infusion from Rheumatology clinic     losartan (COZAAR) 50 MG tablet TAKE 1 TABLET EVERY DAY 90 tablet 1   metFORMIN (GLUCOPHAGE) 500 MG tablet TAKE 2 TABLETS EVERY MORNING AND TAKE 1 TABLET EVERY EVENING 270 tablet 1   methotrexate (RHEUMATREX) 2.5 MG tablet Take 25 mg  by mouth every Friday. Caution:Chemotherapy. Protect from light.     Microlet Lancets MISC Use to check Blood sugar Once a day.  EX: E11.9 100 each 1   Omega-3 Fatty Acids (FISH OIL PO) Take 1 capsule by mouth in the morning. 1040 mg of fish oil in each capsule     omeprazole (PRILOSEC) 40 MG capsule Take 1 capsule by mouth once daily 90 capsule 3   predniSONE (DELTASONE) 5 MG tablet Take 5 mg by mouth daily.     rivaroxaban (XARELTO) 10 MG TABS tablet TAKE 1 TABLET EVERY DAY 90 tablet 1   rosuvastatin (CRESTOR) 10 MG tablet Take 1 tablet (10 mg total) by mouth daily. 90 tablet 0   Semaglutide (RYBELSUS) 7 MG TABS Take 7 mg by mouth daily. 90 tablet 0   vitamin C (ASCORBIC ACID) 500 MG tablet Take 1,000 mg by mouth daily.      Zinc 25 MG TABS Take by mouth.     No  current facility-administered medications for this visit.    No Known Allergies  Family History  Problem Relation Age of Onset   Cancer Mother        skin   Dementia Mother    Cancer Sister        skin and liver   Stroke Maternal Grandmother    Skin cancer Brother    Diabetes Neg Hx    Heart disease Neg Hx    Breast cancer Neg Hx     Social History   Socioeconomic History   Marital status: Married    Spouse name: Not on file   Number of children: Not on file   Years of education: Not on file   Highest education level: Not on file  Occupational History   Not on file  Tobacco Use   Smoking status: Former    Packs/day: 4.00    Years: 16.00    Total pack years: 64.00    Types: Cigarettes    Quit date: 09/12/1995    Years since quitting: 26.7   Smokeless tobacco: Never   Tobacco comments:    quit in 1997  Vaping Use   Vaping Use: Never used  Substance and Sexual Activity   Alcohol use: No    Alcohol/week: 0.0 standard drinks of alcohol   Drug use: No   Sexual activity: Yes    Birth control/protection: None  Other Topics Concern   Not on file  Social History Narrative   Not on file   Social Determinants of Health   Financial Resource Strain: Low Risk  (04/12/2022)   Overall Financial Resource Strain (CARDIA)    Difficulty of Paying Living Expenses: Not very hard  Food Insecurity: No Food Insecurity (04/12/2022)   Hunger Vital Sign    Worried About Running Out of Food in the Last Year: Never true    [place] in the Last Year: Never true  Transportation Needs: No Transportation Needs (04/12/2022)   PRAPARE - Hydrologist (Medical): No    Lack of Transportation (Non-Medical): No  Physical Activity: Inactive (04/12/2022)   Exercise Vital Sign    Days of Exercise per Week: 0 days    Minutes of Exercise per Session: 0 min  Stress: No Stress Concern Present (04/12/2022)   [place]    Feeling of Stress : Not at all  Social Connections: Moderately Integrated (04/12/2022)   Social Connection and  Isolation Panel [NHANES]    Frequency of Communication with Friends and Family: Three times a week    Frequency of Social Gatherings with Friends and Family: Once a week    Attends Religious Services: More than 4 times per year    Active Member of [employer] or Organizations: No    Attends Archivist Meetings: Never    Marital Status: Married  Human resources officer Violence: Not At Risk (04/12/2022)   Humiliation, Afraid, Rape, and Kick questionnaire    Fear of Current or Ex-Partner: No    Emotionally Abused: No    Physically Abused: No    Sexually Abused: No     Constitutional: Denies fever, malaise, fatigue, headache or abrupt weight changes.  HEENT: Denies eye pain, eye redness, ear pain, ringing in the ears, wax buildup, runny nose, nasal congestion, bloody nose, or sore throat. Respiratory: Patient reports intermittent shortness of breath.  Denies difficulty breathing, cough or sputum production.   Cardiovascular: Patient reports swelling in legs.  Denies chest pain, chest tightness, palpitations or swelling in the hands.  Gastrointestinal: Denies abdominal pain, bloating, constipation, diarrhea or blood in the stool.  GU: Denies urgency, frequency, pain with urination, burning sensation, blood in urine, odor or discharge. Musculoskeletal: Pt reports intermittent joint pain. Denies decrease in range of motion, difficulty with gait, muscle pain or joint swelling.  Skin: Denies redness, rashes, lesions or ulcercations.  Neurological: Patient reports peripheral neuropathy, difficulty with memory.  Denies dizziness, difficulty with speech or problems with balance and coordination.  Psych: Pt reports anxiety. Denies depression, SI/HI.  No other specific complaints in a complete review of systems (except as listed in HPI above).  Objective:   Physical  Exam  BP 126/82 (BP Location: Right Arm, Patient Position: Sitting, Cuff Size: Large)   Pulse 86   Temp (!) 96.8 F (36 C) (Temporal)   Wt 230 lb (104.3 kg)   SpO2 96%   BMI 44.92 kg/m   Wt Readings from Last 3 Encounters:  04/12/22 252 lb (114.3 kg)  03/07/22 252 lb (114.3 kg)  12/04/21 240 lb (108.9 kg)    General: Appears her stated age, obese, in NAD. Skin: Warm, dry and intact. Purpura noted of left arm. HEENT: Head: normal shape and size; Eyes: sclera white, no icterus, conjunctiva pink, PERRLA and EOMs intact;  Cardiovascular: Normal rate and rhythm. S1,S2 noted.  No murmur, rubs or gallops noted. No JVD or BLE edema. No carotid bruits noted. Pulmonary/Chest: Normal effort and positive vesicular breath sounds. No respiratory distress. No wheezes, rales or ronchi noted.  Musculoskeletal: No difficulty with gait.  Neurological: Alert and oriented. Coordination normal.  Psychiatric: Mood and affect normal. Mildly anxious appearing. Judgment and thought content normal.     BMET    Component Value Date/Time   NA 142 03/07/2022 0912   K 4.3 03/07/2022 0912   CL 101 03/07/2022 0912   CO2 26 03/07/2022 0912   GLUCOSE 165 (H) 03/07/2022 0912   GLUCOSE 138 (H) 04/10/2021 1008   BUN 18 03/07/2022 0912   CREATININE 0.93 03/07/2022 0912   CREATININE 0.93 04/10/2021 1008   CALCIUM 9.5 03/07/2022 0912   GFRNONAA >60 04/30/2019 1203   GFRAA >60 04/30/2019 1203    Lipid Panel     Component Value Date/Time   CHOL 153 03/07/2022 0912   TRIG 290 (H) 03/07/2022 0912   HDL 55 03/07/2022 0912   CHOLHDL 2.8 03/07/2022 0912   CHOLHDL 2.9 04/10/2021 1008   VLDL 39.0  12/21/2019 1149   LDLCALC 53 03/07/2022 0912   LDLCALC 74 04/10/2021 1008    CBC    Component Value Date/Time   WBC 8.1 03/07/2022 0912   WBC 7.9 04/10/2021 1008   RBC 4.12 03/07/2022 0912   RBC 4.41 04/10/2021 1008   HGB 12.7 03/07/2022 0912   HCT 38.0 03/07/2022 0912   PLT 298 03/07/2022 0912   MCV 92  03/07/2022 0912   MCH 30.8 03/07/2022 0912   MCH 30.2 04/10/2021 1008   MCHC 33.4 03/07/2022 0912   MCHC 32.7 04/10/2021 1008   RDW 14.3 03/07/2022 0912   LYMPHSABS 2.1 04/30/2019 1203   MONOABS 0.6 04/30/2019 1203   EOSABS 0.2 04/30/2019 1203   BASOSABS 0.0 04/30/2019 1203    Hgb A1C Lab Results  Component Value Date   HGBA1C 7.6 (A) 03/07/2022           Assessment & Plan:   Allergies:  Will check mold allergy profile   RTC in 6 months, follow-up chronic conditions Webb Silversmith, NP

## 2022-06-07 NOTE — Assessment & Plan Note (Signed)
Compensated Continue losartan and furosemide Reinforced DASH diet and exercise for weight loss C-Met today

## 2022-06-07 NOTE — Assessment & Plan Note (Signed)
Continue gabapentin.

## 2022-06-10 LAB — COMPREHENSIVE METABOLIC PANEL
ALT: 16 IU/L (ref 0–32)
AST: 19 IU/L (ref 0–40)
Albumin/Globulin Ratio: 1.9 (ref 1.2–2.2)
Albumin: 4.3 g/dL (ref 3.9–4.9)
Alkaline Phosphatase: 69 IU/L (ref 44–121)
BUN/Creatinine Ratio: 16 (ref 12–28)
BUN: 15 mg/dL (ref 8–27)
Bilirubin Total: 0.4 mg/dL (ref 0.0–1.2)
CO2: 23 mmol/L (ref 20–29)
Calcium: 9.7 mg/dL (ref 8.7–10.3)
Chloride: 102 mmol/L (ref 96–106)
Creatinine, Ser: 0.96 mg/dL (ref 0.57–1.00)
Globulin, Total: 2.3 g/dL (ref 1.5–4.5)
Glucose: 124 mg/dL — ABNORMAL HIGH (ref 70–99)
Potassium: 4.3 mmol/L (ref 3.5–5.2)
Sodium: 144 mmol/L (ref 134–144)
Total Protein: 6.6 g/dL (ref 6.0–8.5)
eGFR: 65 mL/min/{1.73_m2} (ref >59)

## 2022-06-10 LAB — ALLERGEN PROFILE, MOLD
Alternaria Alternata IgE: <0.1 kU/L
Aspergillus Fumigatus IgE: <0.1 kU/L
Aureobasidi Pullulans IgE: <0.1 kU/L
Candida Albicans IgE: <0.1 kU/L
Cladosporium Herbarum IgE: <0.1 kU/L
M009-IgE Fusarium proliferatum: <0.1 kU/L
M014-IgE Epicoccum purpur: <0.1 kU/L
Mucor Racemosus IgE: <0.1 kU/L
Penicillium Chrysogen IgE: <0.1 kU/L
Phoma Betae IgE: <0.1 kU/L
Setomelanomma Rostrat: <0.1 kU/L
Stemphylium Herbarum IgE: <0.1 kU/L

## 2022-06-10 LAB — LIPID PANEL
Chol/HDL Ratio: 2.5 ratio (ref 0.0–4.4)
Cholesterol, Total: 144 mg/dL (ref 100–199)
HDL: 58 mg/dL (ref >39)
LDL Chol Calc (NIH): 59 mg/dL (ref 0–99)
Triglycerides: 162 mg/dL — ABNORMAL HIGH (ref 0–149)
VLDL Cholesterol Cal: 27 mg/dL (ref 5–40)

## 2022-07-17 ENCOUNTER — Other Ambulatory Visit: Payer: Self-pay | Admitting: Internal Medicine

## 2022-07-17 NOTE — Telephone Encounter (Signed)
Requested Prescriptions  Pending Prescriptions Disp Refills   gabapentin (NEURONTIN) 300 MG capsule [Pharmacy Med Name: GABAPENTIN 300 MG Capsule] 540 capsule 1    Sig: TAKE 2 CAPSULES THREE TIMES DAILY     Neurology: Anticonvulsants - gabapentin Passed - 07/17/2022  9:05 AM      Passed - Cr in normal range and within 360 days    Creat  Date Value Ref Range Status  04/10/2021 0.93 0.50 - 1.05 mg/dL Final   Creatinine, Ser  Date Value Ref Range Status  06/07/2022 0.96 0.57 - 1.00 mg/dL Final   Creatinine,U  Date Value Ref Range Status  10/29/2016 153.4 mg/dL Final         Passed - Completed PHQ-2 or PHQ-9 in the last 360 days      Passed - Valid encounter within last 12 months    Recent Outpatient Visits           1 month ago Diabetes 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, unspecified whether long term insulin use ([place])   [facility] [place], [place], NP   1 month ago Diabetes 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, unspecified whether long term insulin use ([place])   [place], Grayland Ormond A, RPH-CPP   1 month ago Diabetes 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, unspecified whether long term insulin use ([place])   [address], [place]land Ormond A, RPH-CPP   4 months ago Diabetes 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, unspecified whether long term insulin use [facility])   [facility] [place], Coralie Keens, NP   7 months ago Thyroid nodule   [facility] [place], Coralie Keens, NP       Future Appointments             In 4 months Baity, Coralie Keens, NP [facility], [facility]

## 2022-07-31 ENCOUNTER — Other Ambulatory Visit: Payer: Self-pay | Admitting: Internal Medicine

## 2022-07-31 NOTE — Telephone Encounter (Signed)
Requested Prescriptions  Pending Prescriptions Disp Refills   RYBELSUS 7 MG TABS [Pharmacy Med Name: RYBELSUS 7 MG Tablet] 90 tablet 0    Sig: TAKE 1 TABLET EVERY DAY     Off-Protocol Failed - 07/31/2022  3:11 AM      Failed - Medication not assigned to a protocol, review manually.      Passed - Valid encounter within last 12 months    Recent Outpatient Visits           1 month ago Diabetes 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, unspecified whether long term insulin use [facility])   [facility] [place], [place] W, NP   2 months ago Diabetes 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, unspecified whether long term insulin use ([place])   [place], Grayland Ormond A, RPH-CPP   2 months ago Diabetes 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, unspecified whether long term insulin use ([place])   [place], Grayland Ormond A, RPH-CPP   4 months ago Diabetes 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, unspecified whether long term insulin use [facility])   [facility] [place], Coralie Keens, NP   7 months ago Thyroid nodule   [facility] [place], Coralie Keens, NP       Future Appointments             In 4 months Baity, Coralie Keens, NP [facility], [facility]

## 2022-08-05 ENCOUNTER — Other Ambulatory Visit: Payer: Self-pay | Admitting: Internal Medicine

## 2022-08-06 NOTE — Telephone Encounter (Signed)
Requested Prescriptions  Pending Prescriptions Disp Refills   rosuvastatin (CRESTOR) 10 MG tablet [Pharmacy Med Name: ROSUVASTATIN CALCIUM 10 MG Tablet] 90 tablet 3    Sig: TAKE 1 TABLET EVERY DAY     Cardiovascular:  Antilipid - Statins 2 Failed - 08/05/2022  2:57 AM      Failed - Lipid Panel in normal range within the last 12 months    Cholesterol, Total  Date Value Ref Range Status  06/07/2022 144 100 - 199 mg/dL Final   LDL Cholesterol (Calc)  Date Value Ref Range Status  04/10/2021 74 mg/dL (calc) Final    Comment:    Reference range: <100 . Desirable range <100 mg/dL for primary prevention;   <70 mg/dL for patients with CHD or diabetic patients  with > or = 2 CHD risk factors. Marland Kitchen LDL-C is now calculated using the Martin-Hopkins  calculation, which is a validated novel method providing  better accuracy than the Friedewald equation in the  estimation of LDL-C.  Cresenciano Genre et al. Annamaria Helling. [id number]): 2061-2068  (http://education.[employer].com/faq/FAQ164)    LDL Chol Calc (NIH)  Date Value Ref Range Status  06/07/2022 59 0 - 99 mg/dL Final   Direct LDL  Date Value Ref Range Status  05/16/2014 142.0 mg/dL Final    Comment:    Optimal:  <100 mg/dLNear or Above Optimal:  100-129 mg/dLBorderline High:  130-159 mg/dLHigh:  160-189 mg/dLVery High:  >190 mg/dL   HDL  Date Value Ref Range Status  06/07/2022 58 >39 mg/dL Final   Triglycerides  Date Value Ref Range Status  06/07/2022 162 (H) 0 - 149 mg/dL Final         Passed - Cr in normal range and within 360 days    Creat  Date Value Ref Range Status  04/10/2021 0.93 0.50 - 1.05 mg/dL Final   Creatinine, Ser  Date Value Ref Range Status  06/07/2022 0.96 0.57 - 1.00 mg/dL Final   Creatinine,U  Date Value Ref Range Status  10/29/2016 153.4 mg/dL Final         Passed - Patient is not pregnant      Passed - Valid encounter within last 12 months    Recent Outpatient Visits           2 months ago Diabetes  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, unspecified whether long term insulin use ([place])   [facility] [place], [place], NP   2 months ago Diabetes 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, unspecified whether long term insulin use ([place])   [place], Grayland Ormond A, RPH-CPP   2 months ago Diabetes 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, unspecified whether long term insulin use ([place])   [place], Grayland Ormond A, RPH-CPP   5 months ago Diabetes 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, unspecified whether long term insulin use [facility])   [facility] [place], Coralie Keens, NP   8 months ago Thyroid nodule   [facility] [place], Coralie Keens, NP       Future Appointments             In 4 months Baity, Coralie Keens, NP [facility], [facility]

## 2022-08-07 ENCOUNTER — Encounter: Payer: Self-pay | Admitting: Internal Medicine

## 2022-08-08 MED ORDER — ROSUVASTATIN CALCIUM 10 MG PO TABS: 10.0000 mg | ORAL_TABLET | Freq: Every day | ORAL | 1 refills | Status: DC

## 2022-08-19 ENCOUNTER — Other Ambulatory Visit: Payer: Self-pay | Admitting: Internal Medicine

## 2022-08-20 NOTE — Telephone Encounter (Signed)
Requested Prescriptions  Pending Prescriptions Disp Refills   busPIRone (BUSPAR) 5 MG tablet [Pharmacy Med Name: BUSPIRONE HYDROCHLORIDE 5 MG Tablet] 90 tablet 0    Sig: TAKE 1 TABLET EVERY DAY AS NEEDED     Psychiatry: Anxiolytics/Hypnotics - Non-controlled Passed - 08/19/2022  2:27 AM      Passed - Valid encounter within last 12 months    Recent Outpatient Visits           2 months ago Diabetes 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, unspecified whether long term insulin use [facility])   [place] [facility] [place], [place] W, NP   2 months ago Diabetes 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, unspecified whether long term insulin use (HCC)   [place] [facility] Delles, Gentry Fitz A, RPH-CPP   3 months ago Diabetes 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, unspecified whether long term insulin use (HCC)   [place] [facility] Delles, Gentry Fitz A, RPH-CPP   5 months ago Diabetes 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

## 2022-08-25 ENCOUNTER — Other Ambulatory Visit: Payer: Self-pay | Admitting: Internal Medicine

## 2022-08-26 NOTE — Telephone Encounter (Signed)
Requested Prescriptions  Pending Prescriptions Disp Refills   metFORMIN (GLUCOPHAGE) 500 MG tablet [Pharmacy Med Name: METFORMIN HYDROCHLORIDE 500 MG Tablet] 270 tablet 3    Sig: TAKE 2 TABLETS EVERY MORNING AND TAKE 1 TABLET EVERY EVENING     Endocrinology:  Diabetes - Biguanides Failed - 08/25/2022  4:23 AM      Failed - B12 Level in normal range and within 720 days    No results found for: "VITAMINB12"       Failed - CBC within normal limits and completed in the last 12 months    WBC  Date Value Ref Range Status  03/07/2022 8.1 3.4 - 10.8 x10E3/uL Final  04/10/2021 7.9 3.8 - 10.8 Thousand/uL Final   RBC  Date Value Ref Range Status  03/07/2022 4.12 3.77 - 5.28 x10E6/uL Final  04/10/2021 4.41 3.80 - 5.10 Million/uL Final   Hemoglobin  Date Value Ref Range Status  03/07/2022 12.7 11.1 - 15.9 g/dL Final   Hematocrit  Date Value Ref Range Status  03/07/2022 38.0 34.0 - 46.6 % Final   MCHC  Date Value Ref Range Status  03/07/2022 33.4 31.5 - 35.7 g/dL Final  65/07/5463 68.1 32.0 - 36.0 g/dL Final   [facility]  Date Value Ref Range Status  03/07/2022 30.8 26.6 - 33.0 pg Final  04/10/2021 30.2 27.0 - 33.0 pg Final   MCV  Date Value Ref Range Status  03/07/2022 92 79 - 97 fL Final   No results found for: "PLTCOUNTKUC", "LABPLAT", "POCPLA" RDW  Date Value Ref Range Status  03/07/2022 14.3 11.7 - 15.4 % Final         Passed - Cr in normal range and within 360 days    Creat  Date Value Ref Range Status  04/10/2021 0.93 0.50 - 1.05 mg/dL Final   Creatinine, Ser  Date Value Ref Range Status  06/07/2022 0.96 0.57 - 1.00 mg/dL Final   Creatinine,U  Date Value Ref Range Status  10/29/2016 153.4 mg/dL Final         Passed - HBA1C is between 0 and 7.9 and within 180 days    Hemoglobin A1C  Date Value Ref Range Status  06/07/2022 6.6 (A) 4.0 - 5.6 % Final   Hgb A1c MFr Bld  Date Value Ref Range Status  04/10/2021 7.1 (H) <5.7 % of total Hgb Final    Comment:    For  someone without known diabetes, a hemoglobin A1c value of 6.5% or greater indicates that they may have  diabetes and this should be confirmed with a follow-up  test. . For someone with known diabetes, a value <7% indicates  that their diabetes is well controlled and a value  greater than or equal to 7% indicates suboptimal  control. A1c targets should be individualized based on  duration of diabetes, age, comorbid conditions, and  other considerations. . Currently, no consensus exists regarding use of hemoglobin A1c for diagnosis of diabetes for children. .          Passed - eGFR in normal range and within 360 days    GFR calc Af Amer  Date Value Ref Range Status  04/30/2019 >60 >60 mL/min Final   GFR calc non Af Amer  Date Value Ref Range Status  04/30/2019 >60 >60 mL/min Final   GFR  Date Value Ref Range Status  12/21/2019 53.40 (L) >60.00 mL/min Final   eGFR  Date Value Ref Range Status  06/07/2022 65 >59 mL/min/1.73 Final  Passed - Valid encounter within last 6 months    Recent Outpatient Visits           2 months ago Diabetes 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, unspecified whether long term insulin use (HCC)   [place] [facility] [place], [place], NP   3 months ago Diabetes 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, unspecified whether long term insulin use (HCC)   [place] [facility] Delles, Gentry Fitz A, RPH-CPP   3 months ago Diabetes 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, unspecified whether long term insulin use (HCC)   [place] [facility] Delles, Gentry Fitz A, RPH-CPP   5 months ago Diabetes 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, unspecified whether long term insulin use (HCC)   [place] [facility] [place], [place] W, NP   8 months ago Thyroid  nodule   [place] [facility] [place], Salvadore Oxford, NP       Future Appointments             In 3 months Baity, Salvadore Oxford, NP [place] [facility], [facility]             furosemide (LASIX) 40 MG tablet [Pharmacy Med Name: FUROSEMIDE 40 MG Tablet] 90 tablet 0    Sig: TAKE 1 TABLET EVERY DAY     Cardiovascular:  Diuretics - Loop Failed - 08/25/2022  4:23 AM      Failed - Mg Level in normal range and within 180 days    No results found for: "MG"       Passed - K in normal range and within 180 days    Potassium  Date Value Ref Range Status  06/07/2022 4.3 3.5 - 5.2 mmol/L Final         Passed - Ca in normal range and within 180 days    Calcium  Date Value Ref Range Status  06/07/2022 9.7 8.7 - 10.3 mg/dL Final         Passed - Na in normal range and within 180 days    Sodium  Date Value Ref Range Status  06/07/2022 144 134 - 144 mmol/L Final         Passed - Cr in normal range and within 180 days    Creat  Date Value Ref Range Status  04/10/2021 0.93 0.50 - 1.05 mg/dL Final   Creatinine, Ser  Date Value Ref Range Status  06/07/2022 0.96 0.57 - 1.00 mg/dL Final   Creatinine,U  Date Value Ref Range Status  10/29/2016 153.4 mg/dL Final         Passed - Cl in normal range and within 180 days    Chloride  Date Value Ref Range Status  06/07/2022 102 96 - 106 mmol/L Final         Passed - Last BP in normal range    BP Readings from Last 1 Encounters:  06/07/22 126/82         Passed - Valid encounter within last 6 months    Recent Outpatient Visits           2 months ago Diabetes 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, unspecified whether long term insulin use [facility])    [facility] [place], [place], NP   3 months ago Diabetes 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 condition with  diabetic autonomic neuropathy, unspecified whether long term insulin use (HCC)   [place] [facility] Delles, Gentry Fitz A, RPH-CPP   3 months ago Diabetes 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, unspecified whether long term insulin use (HCC)   [place] [facility] Delles, Gentry Fitz A, RPH-CPP   5 months ago Diabetes 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, unspecified whether long term insulin use (HCC)   [place] [facility] [place], Salvadore Oxford, NP   8 months ago Thyroid nodule   [place] [facility] [place], Salvadore Oxford, NP       Future Appointments             In 3 months Baity, Salvadore Oxford, NP [place] [facility], [facility]             losartan (COZAAR) 50 MG tablet [Pharmacy Med Name: LOSARTAN POTASSIUM 50 MG Tablet] 90 tablet 0    Sig: TAKE 1 TABLET EVERY DAY     Cardiovascular:  Angiotensin Receptor Blockers Passed - 08/25/2022  4:23 AM      Passed - Cr in normal range and within 180 days    Creat  Date Value Ref Range Status  04/10/2021 0.93 0.50 - 1.05 mg/dL Final   Creatinine, Ser  Date Value Ref Range Status  06/07/2022 0.96 0.57 - 1.00 mg/dL Final   Creatinine,U  Date Value Ref Range Status  10/29/2016 153.4 mg/dL Final         Passed - K in normal range and within 180 days    Potassium  Date Value Ref Range Status  06/07/2022 4.3 3.5 - 5.2 mmol/L Final         Passed - Patient is not pregnant      Passed - Last BP in normal range    BP Readings from Last 1 Encounters:  06/07/22 126/82         Passed - Valid encounter within last 6 months    Recent Outpatient Visits           2 months ago Diabetes 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, unspecified whether long term insulin use [facility])   [place] [facility] [place], [place], NP   3 months ago Diabetes 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, unspecified whether long term insulin  use (HCC)   [place] [facility] Delles, Gentry Fitz A, RPH-CPP   3 months ago Diabetes 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

## 2022-09-02 ENCOUNTER — Other Ambulatory Visit: Payer: Self-pay | Admitting: Internal Medicine

## 2022-09-03 NOTE — Telephone Encounter (Signed)
Requested Prescriptions  Refused Prescriptions Disp Refills   XARELTO 10 MG TABS tablet [Pharmacy Med Name: XARELTO 10 MG Tablet] 90 tablet 3    Sig: TAKE 1 TABLET EVERY DAY     Hematology: Anticoagulants - rivaroxaban Passed - 09/02/2022  2:34 AM      Passed - ALT in normal range and within 360 days    ALT  Date Value Ref Range Status  06/07/2022 16 0 - 32 IU/L Final         Passed - AST in normal range and within 360 days    AST  Date Value Ref Range Status  06/07/2022 19 0 - 40 IU/L Final         Passed - Cr in normal range and within 360 days    Creat  Date Value Ref Range Status  04/10/2021 0.93 0.50 - 1.05 mg/dL Final   Creatinine, Ser  Date Value Ref Range Status  06/07/2022 0.96 0.57 - 1.00 mg/dL Final   Creatinine,U  Date Value Ref Range Status  10/29/2016 153.4 mg/dL Final         Passed - HCT in normal range and within 360 days    Hematocrit  Date Value Ref Range Status  03/07/2022 38.0 34.0 - 46.6 % Final         Passed - HGB in normal range and within 360 days    Hemoglobin  Date Value Ref Range Status  03/07/2022 12.7 11.1 - 15.9 g/dL Final         Passed - PLT in normal range and within 360 days    Platelets  Date Value Ref Range Status  03/07/2022 298 150 - 450 x10E3/uL Final         Passed - eGFR is 15 or above and within 360 days    GFR calc Af Amer  Date Value Ref Range Status  04/30/2019 >60 >60 mL/min Final   GFR calc non Af Amer  Date Value Ref Range Status  04/30/2019 >60 >60 mL/min Final   GFR  Date Value Ref Range Status  12/21/2019 53.40 (L) >60.00 mL/min Final   eGFR  Date Value Ref Range Status  06/07/2022 65 >59 mL/min/1.73 Final         Passed - Patient is not pregnant      Passed - Valid encounter within last 12 months    Recent Outpatient Visits           2 months ago Diabetes 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, unspecified whether long term insulin use (HCC)   [place]  [facility] [place], [place], NP   3 months ago Diabetes 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 [place], Salvadore Oxford, NP   9 months ago Thyroid nodule   [place] [facility] [place], Salvadore Oxford, NP       Future Appointments             In 3 months  [name], NP [facility], [facility]

## 2022-09-04 ENCOUNTER — Encounter: Payer: Self-pay | Admitting: Internal Medicine

## 2022-09-04 ENCOUNTER — Other Ambulatory Visit: Payer: Self-pay

## 2022-09-04 MED ORDER — RIVAROXABAN 10 MG PO TABS: 10.0000 mg | ORAL_TABLET | Freq: Every day | ORAL | 1 refills | Status: DC

## 2022-10-23 ENCOUNTER — Other Ambulatory Visit: Payer: Self-pay | Admitting: Internal Medicine

## 2022-10-23 NOTE — Telephone Encounter (Signed)
Requested medication (s) are due for refill today- yes  Requested medication (s) are on the active medication list -yes  Future visit scheduled -yes  Last refill: 07/31/22 #90  Notes to clinic: off protocol- provider review   Requested Prescriptions  Pending Prescriptions Disp Refills   RYBELSUS 7 MG TABS [Pharmacy Med Name: RYBELSUS 7 MG Tablet] 90 tablet 3    Sig: TAKE 1 TABLET EVERY DAY     Off-Protocol Failed - 10/23/2022  8:22 AM      Failed - Medication not assigned to a protocol, review manually.      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, unspecified whether long term insulin use (HCC)   [place] [facility] Delles, Gentry Fitz A, RPH-CPP   7 months ago Diabetes 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, unspecified whether long term insulin use [facility])   [place] [facility] [place], Salvadore Oxford, NP   10 months ago Thyroid nodule   [place] [facility] [place], Salvadore Oxford, NP       Future Appointments             In 1 month Baity, Salvadore Oxford, NP [place] [facility], [facility]               Requested Prescriptions  Pending Prescriptions Disp Refills   RYBELSUS 7 MG TABS [Pharmacy Med Name: RYBELSUS 7 MG Tablet] 90 tablet 3    Sig: TAKE 1 TABLET EVERY DAY     Off-Protocol Failed - 10/23/2022  8:22 AM      Failed -  Medication not assigned to a protocol, review manually.      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 [place], Salvadore Oxford, NP   10 months ago Thyroid nodule   [place] [facility] [place], Salvadore Oxford, NP       Future Appointments             In 1 month [place], Salvadore Oxford, NP [place] [facility], [facility]

## 2022-10-30 ENCOUNTER — Other Ambulatory Visit: Payer: Self-pay | Admitting: Internal Medicine

## 2022-10-30 NOTE — Telephone Encounter (Signed)
Requested Prescriptions  Pending Prescriptions Disp Refills   busPIRone (BUSPAR) 5 MG tablet [Pharmacy Med Name: BUSPIRONE HYDROCHLORIDE 5 MG Tablet] 90 tablet 3    Sig: TAKE 1 TABLET EVERY DAY AS NEEDED     Psychiatry: Anxiolytics/Hypnotics - Non-controlled Passed - 10/30/2022  3:05 AM      Passed - Valid encounter within last 12 months    Recent Outpatient Visits           4 months ago Diabetes 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, unspecified whether long term insulin use [facility])   [place] [facility] [place], [place], NP   5 months ago Diabetes 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, unspecified whether long term insulin use (HCC)   [place] [facility] D[place], Gentry Fitz A, RPH-CPP   5 months ago Diabetes 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, unspecified whether long term insulin use (HCC)   [place] [facility] Delles, Gentry Fitz A, RPH-CPP   7 months ago Diabetes 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

## 2022-11-07 ENCOUNTER — Other Ambulatory Visit: Payer: Self-pay | Admitting: Internal Medicine

## 2022-11-07 NOTE — Telephone Encounter (Signed)
Requested Prescriptions  Pending Prescriptions Disp Refills   furosemide (LASIX) 40 MG tablet [Pharmacy Med Name: FUROSEMIDE 40 MG Tablet] 90 tablet 0    Sig: TAKE 1 TABLET EVERY DAY     Cardiovascular:  Diuretics - Loop Failed - 11/07/2022  1:50 AM      Failed - Mg Level in normal range and within 180 days    No results found for: "MG"       Passed - K in normal range and within 180 days    Potassium  Date Value Ref Range Status  06/07/2022 4.3 3.5 - 5.2 mmol/L Final         Passed - Ca in normal range and within 180 days    Calcium  Date Value Ref Range Status  06/07/2022 9.7 8.7 - 10.3 mg/dL Final         Passed - Na in normal range and within 180 days    Sodium  Date Value Ref Range Status  06/07/2022 144 134 - 144 mmol/L Final         Passed - Cr in normal range and within 180 days    Creat  Date Value Ref Range Status  04/10/2021 0.93 0.50 - 1.05 mg/dL Final   Creatinine, Ser  Date Value Ref Range Status  06/07/2022 0.96 0.57 - 1.00 mg/dL Final   Creatinine,U  Date Value Ref Range Status  10/29/2016 153.4 mg/dL Final         Passed - Cl in normal range and within 180 days    Chloride  Date Value Ref Range Status  06/07/2022 102 96 - 106 mmol/L Final         Passed - Last BP in normal range    BP Readings from Last 1 Encounters:  06/07/22 126/82         Passed - Valid encounter within last 6 months    Recent Outpatient Visits           5 months ago Diabetes 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, unspecified whether long term insulin use (HCC)   [place] [facility] [place], [place], NP   5 months ago Diabetes 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, unspecified whether long term insulin use (HCC)   [place] [facility] Delles, Gentry Fitz A, RPH-CPP   5 months ago Diabetes 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,  unspecified whether long term insulin use (HCC)   [place] [facility] Delles, Gentry Fitz A, RPH-CPP   8 months ago Diabetes 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, unspecified whether long term insulin use [facility])   [place] [facility] [place], Salvadore Oxford, NP   11 months ago Thyroid nodule    [facility] [place], Salvadore Oxford, NP       Future Appointments             In 1 month Baity, Salvadore Oxford, NP  [facility]             metFORMIN (GLUCOPHAGE) 500 MG tablet [Pharmacy Med Name: METFORMIN HYDROCHLORIDE 500 MG Tablet] 270 tablet 0    Sig: TAKE 2 TABLETS EVERY MORNING AND TAKE 1 TABLET EVERY EVENING     Endocrinology:  Diabetes - Biguanides Failed - 11/07/2022  1:50 AM      Failed - B12 Level in normal range and  within 720 days    No results found for: "VITAMINB12"       Failed - CBC within normal limits and completed in the last 12 months    WBC  Date Value Ref Range Status  03/07/2022 8.1 3.4 - 10.8 x10E3/uL Final  04/10/2021 7.9 3.8 - 10.8 Thousand/uL Final   RBC  Date Value Ref Range Status  03/07/2022 4.12 3.77 - 5.28 x10E6/uL Final  04/10/2021 4.41 3.80 - 5.10 Million/uL Final   Hemoglobin  Date Value Ref Range Status  03/07/2022 12.7 11.1 - 15.9 g/dL Final   Hematocrit  Date Value Ref Range Status  03/07/2022 38.0 34.0 - 46.6 % Final   MCHC  Date Value Ref Range Status  03/07/2022 33.4 31.5 - 35.7 g/dL Final  16/02/9603 54.0 32.0 - 36.0 g/dL Final   [facility]  Date Value Ref Range Status  03/07/2022 30.8 26.6 - 33.0 pg Final  04/10/2021 30.2 27.0 - 33.0 pg Final   MCV  Date Value Ref Range Status  03/07/2022 92 79 - 97 fL Final   No results found for: "PLTCOUNTKUC", "LABPLAT", "POCPLA" RDW  Date Value Ref Range Status  03/07/2022 14.3 11.7 - 15.4 % Final         Passed - Cr in normal range and within 360 days    Creat  Date Value Ref  Range Status  04/10/2021 0.93 0.50 - 1.05 mg/dL Final   Creatinine, Ser  Date Value Ref Range Status  06/07/2022 0.96 0.57 - 1.00 mg/dL Final   Creatinine,U  Date Value Ref Range Status  10/29/2016 153.4 mg/dL Final         Passed - HBA1C is between 0 and 7.9 and within 180 days    Hemoglobin A1C  Date Value Ref Range Status  06/07/2022 6.6 (A) 4.0 - 5.6 % Final   Hgb A1c MFr Bld  Date Value Ref Range Status  04/10/2021 7.1 (H) <5.7 % of total Hgb Final    Comment:    For someone without known diabetes, a hemoglobin A1c value of 6.5% or greater indicates that they may have  diabetes and this should be confirmed with a follow-up  test. . For someone with known diabetes, a value <7% indicates  that their diabetes is well controlled and a value  greater than or equal to 7% indicates suboptimal  control. A1c targets should be individualized based on  duration of diabetes, age, comorbid conditions, and  other considerations. . Currently, no consensus exists regarding use of hemoglobin A1c for diagnosis of diabetes for children. .          Passed - eGFR in normal range and within 360 days    GFR calc Af Amer  Date Value Ref Range Status  04/30/2019 >60 >60 mL/min Final   GFR calc non Af Amer  Date Value Ref Range Status  04/30/2019 >60 >60 mL/min Final   GFR  Date Value Ref Range Status  12/21/2019 53.40 (L) >60.00 mL/min Final   eGFR  Date Value Ref Range Status  06/07/2022 65 >59 mL/min/1.73 Final         Passed - Valid encounter within last 6 months    Recent Outpatient Visits           5 months ago Diabetes 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, unspecified whether long term insulin use [facility])   [place] [facility] [place], Salvadore Oxford, NP   5 months ago Diabetes mellitus due  to underlying condition with diabetic autonomic neuropathy, unspecified whether long term insulin use (HCC)   [place] [facility] Delles, Gentry Fitz A, RPH-CPP   5 months ago Diabetes 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, unspecified whether long term insulin use (HCC)   [place] [facility] Delles, Gentry Fitz A, RPH-CPP   8 months ago Diabetes 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, unspecified whether long term insulin use [facility])   [place] [facility] [place], Salvadore Oxford, NP   11 months ago Thyroid nodule   [place] [facility] [place], Salvadore Oxford, NP       Future Appointments             In 1 month [place], Salvadore Oxford, NP [facility]             losartan (COZAAR) 50 MG tablet [Pharmacy Med Name: LOSARTAN POTASSIUM 50 MG Tablet] 90 tablet 0    Sig: TAKE 1 TABLET EVERY DAY     Cardiovascular:  Angiotensin Receptor Blockers Passed - 11/07/2022  1:50 AM      Passed - Cr in normal range and within 180 days    Creat  Date Value Ref Range Status  04/10/2021 0.93 0.50 - 1.05 mg/dL Final   Creatinine, Ser  Date Value Ref Range Status  06/07/2022 0.96 0.57 - 1.00 mg/dL Final   Creatinine,U  Date Value Ref Range Status  10/29/2016 153.4 mg/dL Final         Passed - K in normal range and within 180 days    Potassium  Date Value Ref Range Status  06/07/2022 4.3 3.5 - 5.2 mmol/L Final         Passed - Patient is not pregnant      Passed - Last BP in normal range    BP Readings from Last 1 Encounters:  06/07/22 126/82         Passed - Valid encounter within last 6 months    Recent Outpatient Visits           5 months ago Diabetes 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, unspecified whether long term insulin use [facility])   [place] [facility] [place], [place], NP   5 months ago Diabetes 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, unspecified whether long term insulin  use (HCC)   [place] [facility] Delles, Gentry Fitz A, RPH-CPP   5 months ago Diabetes 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, unspecified whether long term insulin use (HCC)   [place] [facility] Delles, Gentry Fitz A, RPH-CPP   8 months ago 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

## 2022-11-20 LAB — HM DIABETES EYE EXAM

## 2022-11-22 ENCOUNTER — Telehealth: Payer: Medicare Other

## 2022-11-25 ENCOUNTER — Ambulatory Visit: Payer: Medicare Other | Admitting: Pharmacist

## 2022-11-25 DIAGNOSIS — E1169 Type 2 diabetes mellitus with other specified complication: Secondary | ICD-10-CM

## 2022-11-25 DIAGNOSIS — E0843 Diabetes 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 autonomic (poly)neuropathy: Secondary | ICD-10-CM

## 2022-11-25 DIAGNOSIS — I10 Essential (primary) hypertension: Secondary | ICD-10-CM

## 2022-11-25 NOTE — Progress Notes (Signed)
11/25/2022 Name: Melissa Cabrera MRN: [id number] DOB: 1955/08/01  Chief Complaint  Patient presents with   Medication Management    Melissa Cabrera is a 67 y.o. year old female who presented for a telephone visit.   They were referred to the pharmacist by their PCP for assistance in managing diabetes, hypertension, and hyperlipidemia.      Subjective:   Care Team: Primary Care Provider: Lorre Munroe, NP ; Next Scheduled Visit: 12/16/2022 Hematology: Rickard Patience, MD   Medication Access/Adherence  Current Pharmacy:  [facility] [address] ([address]), [place] - [address] [place] ([place]) [place] [address] Phone: [phone] Fax: [phone]  [facility] - [place], [place] - [address] [place] [address] Phone: [phone] Fax: [phone]   Patient reports affordability concerns with their medications: No  Patient reports access/transportation concerns to their pharmacy: No  Patient reports adherence concerns with their medications:  No     Diabetes:     Current medications:  - Metformin 500 mg - 2 tablets (1000 mg) with breakfast and 1 tablet (500 mg) with supper  - Rybelsus 7 mg daily Confirms taking on an empty stomach, ?30 minutes before the first food, beverage, or other oral medications of the day with ?4 oz of plain water only Reports tolerating well - Note patient on prednisone 5 mg daily from Rheumatology    Medications tried in the past: glipizide ER   Denies monitoring home blood sugar recently   Reports current home weight: ~220 lbs   Reports having regular well-balanced meals, while limiting carbohydrate portion sizes   Exercise: limited by foot and leg pain making walking painful - Again discussed option to follow up with [employer] as patient has reported she enjoys swimming, but patient not interested in trying at this time   Current medication access support: approved  for Full Extra Help subsidy as of 08/2021      Hyperlipidemia/ASCVD Risk Reduction   Current lipid lowering medications: rosuvastatin 10 mg daily; ezetimibe 10 mg daily   Current physical activity: Exercise: limited by foot and leg pain making walking painful - Again discussed option to follow up with [facility] as patient has reported she enjoys swimming, but patient not interested in trying at this time    Hypertension:   Current medications: losartan 50 mg daily; furosemide 40 mg daily   Patient has an automated, upper arm home BP cuff Denies checking home blood pressure recently   Patient denies hypotensive s/sx including dizziness, lightheadedness.      Current physical activity: Exercise: limited by foot and leg pain making walking painful - Again discussed option to follow up with [facility] as patient has reported she enjoys swimming, but patient not interested in trying at this time   Objective:  Lab Results  Component Value Date   HGBA1C 6.6 (A) 06/07/2022    Lab Results  Component Value Date   CREATININE 0.96 06/07/2022   BUN 15 06/07/2022   NA 144 06/07/2022   K 4.3 06/07/2022   CL 102 06/07/2022   CO2 23 06/07/2022    Lab Results  Component Value Date   CHOL 144 06/07/2022   HDL 58 06/07/2022   LDLCALC 59 06/07/2022   LDLDIRECT 142.0 05/16/2014   TRIG 162 (H) 06/07/2022   CHOLHDL 2.5 06/07/2022   BP Readings from Last 3 Encounters:  06/07/22 126/82  03/07/22 132/88  12/04/21 124/62   Pulse Readings from Last  3 Encounters:  06/07/22 86  03/07/22 69  12/04/21 68    Medications Reviewed Today     Reviewed by Manuela Neptune, RPH-CPP (Pharmacist) on 11/25/22 at [phone]  Med List Status: <None>   Medication Order Taking? Sig Documenting Provider Last Dose Status Informant  acetaminophen (TYLENOL) 650 MG CR tablet [id number]  Take 1,300 mg by mouth daily. [provider]  Active   busPIRone (BUSPAR) 5 MG tablet [id number]  TAKE 1 TABLET EVERY DAY  AS NEEDED Baity, Salvadore Oxford, NP  Active   cetirizine (ZYRTEC) 10 MG tablet [id number] Yes Take 10 mg by mouth at bedtime.  [provider] Taking Active Self  Cholecalciferol 25 MCG (1000 UT) tablet [id number] Yes Take 1,000 Units by mouth daily. [provider] Taking Active   diphenhydrAMINE (BENADRYL) 25 mg capsule [id number] Yes Take 50 mg by mouth at bedtime.  [provider] Taking Active Self  doxycycline (ADOXA) 100 MG tablet [id number] Yes Take 50 mg by mouth daily. [provider] Taking Active   ezetimibe (ZETIA) 10 MG tablet [id number] Yes TAKE 1 TABLET EVERY DAY Baity, Salvadore Oxford, NP Taking Active   fluticasone (FLONASE) 50 MCG/ACT nasal spray [id number] Yes Place 2 sprays into both nostrils daily. Lorre Munroe, NP Taking Active   folic acid (FOLVITE) 1 MG tablet [id number] Yes Take 1 mg by mouth daily. [provider] Taking Active Self           Med Note De Nurse, [facility] A   Fri Oct 06, 2020  9:32 AM) Reports takes 3 tablets daily on Friday & Saturday and 1 tablet daily Sunday-Thursday as directed by Rheumatology  furosemide (LASIX) 40 MG tablet [id number] Yes TAKE 1 TABLET EVERY DAY Lorre Munroe, NP Taking Active   gabapentin (NEURONTIN) 300 MG capsule [id number] Yes TAKE 2 CAPSULES THREE TIMES DAILY Lorre Munroe, NP Taking Active   glucose blood (CONTOUR NEXT TEST) test strip [id number]  USE TO CHECK GLUCOSE ONCE DAILY  DX: E11.9 Lorre Munroe, NP  Active   Golimumab [facility] ARIA IV) [id number] Yes Inject into the vein. Reports receives via infusion from Rheumatology clinic [provider] Taking Active   losartan (COZAAR) 50 MG tablet [id number] Yes TAKE 1 TABLET EVERY DAY Lorre Munroe, NP Taking Active   metFORMIN (GLUCOPHAGE) 500 MG tablet [id number] Yes TAKE 2 TABLETS EVERY MORNING AND TAKE 1 TABLET EVERY EVENING Baity, Salvadore Oxford, NP Taking Active   methotrexate (RHEUMATREX) 2.5 MG tablet [id number] Yes Take 25 mg by mouth every  Friday. Caution:Chemotherapy. Protect from light. [provider] Taking Active Self           Med Note Gershon Mussel, JENNIFER   Thu Nov 21, 2016 11:23 AM) PT AWARE TO STOP PRIOR TO PROCEDURE   Microlet Lancets MISC [id number]  Use to check Blood sugar Once a day.  EX: E11.9 Lorre Munroe, NP  Active   Omega-3 Fatty Acids (FISH OIL PO) [id number] Yes Take 1 capsule by mouth in the morning. 1040 mg of fish oil in each capsule [provider] Taking Active Self  omeprazole (PRILOSEC) 40 MG capsule [id number] Yes Take 1 capsule by mouth once daily Lorre Munroe, NP Taking Active   predniSONE (DELTASONE) 5 MG tablet [id number] Yes Take 5 mg by mouth daily. [provider] Taking Active   rivaroxaban (XARELTO) 10 MG TABS tablet [id number] Yes Take 1 tablet (10 mg total) by mouth daily. Lorre Munroe, NP Taking Active  rosuvastatin (CRESTOR) 10 MG tablet [id number] Yes Take 1 tablet (10 mg total) by mouth daily. Lorre Munroe, NP Taking Active   Semaglutide [facility]) 7 [place] TABS [id number] Yes TAKE 1 TABLET EVERY DAY Baity, Salvadore Oxford, NP Taking Active   vitamin C (ASCORBIC ACID) 500 MG tablet [id number] Yes Take 1,000 mg by mouth daily.  [provider] Taking Active   Zinc 25 MG TABS [id number] Yes Take by mouth. [provider] Taking Active               Assessment/Plan:   Diabetes: - Encourage patient to have regular well-balanced meals, while controlling carbohydrate portion sizes - Encourage patient to restart monitoring home blood sugar, using results as feedback for dietary choices, keeping log of results and to have this record to review during future medical appointments    Hypertension: - Currently controlled - Encourage patient to restart monitoring home blood pressure, keep log of the results and have this record to review during future medical appointments     Hyperlipidemia/ASCVD Risk Reduction: - Have discussed impact of exercise on  lipid-control - Encourage patient to continue to take medications consistently as directed     Follow Up Plan:   Patient denies further medication questions or concerns today Provide patient with contact information for clinic pharmacist to contact if needed in future for medication questions/concerns    Estelle Grumbles, PharmD, [facility] Clinical Pharmacist [facility] [phone]

## 2022-11-25 NOTE — Patient Instructions (Signed)
Goals Addressed             This Visit's Progress    Pharmacy Goals       Our goal A1c is less than 7%. This corresponds with fasting sugars less than 130 and 2 hour after meal sugars less than 180. Please keep a log when you check your blood sugar  Our goal bad cholesterol, or LDL, is less than 70 . This is why it is important to continue taking your rosuvastatin and ezetimibe.  Please check your home blood pressure, keep a log of the results and bring this with you to your medical appointments.  Feel free to call me with any questions or concerns.   Estelle Grumbles, PharmD, [facility] Clinical Pharmacist [facility] [phone]

## 2022-11-26 ENCOUNTER — Encounter: Payer: Self-pay | Admitting: Internal Medicine

## 2022-12-06 ENCOUNTER — Ambulatory Visit: Payer: Medicare Other | Admitting: Internal Medicine

## 2022-12-16 ENCOUNTER — Ambulatory Visit (INDEPENDENT_AMBULATORY_CARE_PROVIDER_SITE_OTHER): Payer: Medicare Other | Admitting: Internal Medicine

## 2022-12-16 ENCOUNTER — Encounter: Payer: Self-pay | Admitting: Internal Medicine

## 2022-12-16 VITALS — BP 114/62 | HR 81 | Temp 96.6°F | Wt 227.0 lb

## 2022-12-16 DIAGNOSIS — F411 Generalized anxiety disorder: Secondary | ICD-10-CM

## 2022-12-16 DIAGNOSIS — E041 Nontoxic single thyroid nodule: Secondary | ICD-10-CM | POA: Insufficient documentation

## 2022-12-16 DIAGNOSIS — D692 Other nonthrombocytopenic purpura: Secondary | ICD-10-CM | POA: Diagnosis not present

## 2022-12-16 DIAGNOSIS — I5032 Chronic diastolic (congestive) heart failure: Secondary | ICD-10-CM

## 2022-12-16 DIAGNOSIS — M159 Polyosteoarthritis, unspecified: Secondary | ICD-10-CM

## 2022-12-16 DIAGNOSIS — M48061 Spinal stenosis, lumbar region without neurogenic claudication: Secondary | ICD-10-CM

## 2022-12-16 DIAGNOSIS — Z1231 Encounter for screening mammogram for malignant neoplasm of breast: Secondary | ICD-10-CM

## 2022-12-16 DIAGNOSIS — E0843 Diabetes 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 autonomic (poly)neuropathy: Secondary | ICD-10-CM

## 2022-12-16 DIAGNOSIS — I10 Essential (primary) hypertension: Secondary | ICD-10-CM | POA: Diagnosis not present

## 2022-12-16 DIAGNOSIS — K219 Gastro-esophageal reflux disease without esophagitis: Secondary | ICD-10-CM

## 2022-12-16 DIAGNOSIS — E785 Hyperlipidemia, unspecified: Secondary | ICD-10-CM

## 2022-12-16 DIAGNOSIS — Z6841 Body Mass Index (BMI) 40.0 and over, adult: Secondary | ICD-10-CM

## 2022-12-16 DIAGNOSIS — Z8542 Personal history of malignant neoplasm of other parts of uterus: Secondary | ICD-10-CM

## 2022-12-16 DIAGNOSIS — Z86718 Personal history of other venous thrombosis and embolism: Secondary | ICD-10-CM

## 2022-12-16 DIAGNOSIS — E1169 Type 2 diabetes mellitus with other specified complication: Secondary | ICD-10-CM

## 2022-12-16 DIAGNOSIS — E1143 Type 2 diabetes mellitus with diabetic autonomic (poly)neuropathy: Secondary | ICD-10-CM | POA: Diagnosis not present

## 2022-12-16 DIAGNOSIS — E119 Type 2 diabetes mellitus without complications: Secondary | ICD-10-CM

## 2022-12-16 DIAGNOSIS — L4050 Arthropathic psoriasis, unspecified: Secondary | ICD-10-CM

## 2022-12-16 LAB — CBC
HCT: 39.2 % (ref 35.0–45.0)
Hemoglobin: 13.1 g/dL (ref 11.7–15.5)
MCH: 32.5 pg (ref 27.0–33.0)
MCHC: 33.4 g/dL (ref 32.0–36.0)
MCV: 97.3 fL (ref 80.0–100.0)
MPV: 9.2 fL (ref 7.5–12.5)
Platelets: 294 10*3/uL (ref 140–400)
RBC: 4.03 10*6/uL (ref 3.80–5.10)
RDW: 14.2 % (ref 11.0–15.0)
WBC: 5.8 10*3/uL (ref 3.8–10.8)

## 2022-12-16 LAB — POCT GLYCOSYLATED HEMOGLOBIN (HGB A1C): Hemoglobin A1C: 6.3 % — AB (ref 4.0–5.6)

## 2022-12-16 MED ORDER — RYBELSUS 7 MG PO TABS: 2.0000 | ORAL_TABLET | Freq: Every day | ORAL | 0 refills | Status: DC

## 2022-12-16 NOTE — Assessment & Plan Note (Signed)
Continue gabapentin as prescribed 

## 2022-12-16 NOTE — Assessment & Plan Note (Signed)
In remission s/p hysterectomy No longer following with oncology

## 2022-12-16 NOTE — Assessment & Plan Note (Signed)
CBC today.  

## 2022-12-16 NOTE — Assessment & Plan Note (Signed)
Thyroid ultrasound ordered.

## 2022-12-16 NOTE — Assessment & Plan Note (Addendum)
Avoid foods that trigger reflux Encourage weight loss as this can help reduce reflux symptoms Continue omeprazole 

## 2022-12-16 NOTE — Assessment & Plan Note (Signed)
Continue with gabapentin, methotrexate, tylenol, simponi and prednisone She will continue to follow with rheumatology

## 2022-12-16 NOTE — Assessment & Plan Note (Signed)
Compensated Reinforced DASH diet, advised her to monitor daily weights Continue losartan and furosemide C-Met today

## 2022-12-16 NOTE — Assessment & Plan Note (Signed)
Controlled on losartan and furosemide Reinforced DASH diet and exercise for weight loss C-Met today

## 2022-12-16 NOTE — Patient Instructions (Signed)

## 2022-12-16 NOTE — Assessment & Plan Note (Signed)
Encourage weight loss as this can help reduce back pain Encouraged regular stretching and core strengthening She has seen neurosurgery in the past and is not ready for follow-up at this time

## 2022-12-16 NOTE — Assessment & Plan Note (Signed)
C-Met and lipid profile today Encouraged her to consume a low-fat diet Continue rosuvastatin, ezetimibe and fish oil

## 2022-12-16 NOTE — Assessment & Plan Note (Signed)
Will need lifelong anticoagualtion Continue xarelto

## 2022-12-16 NOTE — Progress Notes (Signed)
Subjective:    Patient ID: Melissa Cabrera, female    DOB: 1955/08/18, 67 y.o.   MRN: [id number]  HPI  Patient presents to clinic today for 62-month follow-up of chronic conditions.  CHF, diastolic: She reports some lower extremity edema but denies shortness of breath or chronic cough.  She is taking losartan and furosemide as prescribed.  Echo from 07/2017 reviewed.  She does not follow with cardiology.  HTN: Her BP today is 114/62.  She is taking losartan and furosemide as prescribed.  ECG from 11/2018 reviewed.  HLD: Her last LDL was 59, triglyceride 162, 05/2022.  She denies myalgias on rosuvastatin, ezetimibe and fish oil.  She does not consume a low-fat diet.  DM2 with peripheral neuropathy: Her last A1c was 6.6%, 05/2022.  She is taking metformin, rybelsus and gabapentin as prescribed.  She does not check her sugars.  She checks her feet routinely.  Her last eye exam was 11/2022.  Flu never.  Pneumovax never.  Prevnar never.  COVID never.  Anxiety: Chronic, she is prescribed buspirone but she has not tried this.  She is not currently seeing a therapist.  She denies depression, SI/HI.  Lumbar spinal stenosis/OA/psoriatic arthritis: Mainly in her low back and knees.  X-ray lumbar spine and MRI lumbar spine from 2019 reviewed.  Managed with gabapentin methotrexate, tylenol, simponi, and prednisone. She follows with rheumatology.  History of DVT: Managed with lifelong xarelto. She no longer follows with hematology.  History of endometrial cancer: In remission s/p hysterectomy. She no longer follows with oncology.  GERD: Triggered by certain medications.  She denies breakthrough on omeprazole.  There is no upper GI on file.    Review of Systems     Past Medical History:  Diagnosis Date   Allergy    Arthritis    Cancer (HCC)    endometrial   Chicken pox    Diabetes mellitus without complication (HCC)    GERD (gastroesophageal reflux disease)    Heart murmur    DUE TO RHEUMATIC  FEVER PER PT   Hyperlipidemia    Hypertension    Psoriasis    Rheumatic fever    Rosacea     Current Outpatient Medications  Medication Sig Dispense Refill   acetaminophen (TYLENOL) 650 MG CR tablet Take 1,300 mg by mouth daily.     busPIRone (BUSPAR) 5 MG tablet TAKE 1 TABLET EVERY DAY AS NEEDED 90 tablet 0   cetirizine (ZYRTEC) 10 MG tablet Take 10 mg by mouth at bedtime.      Cholecalciferol 25 MCG (1000 UT) tablet Take 1,000 Units by mouth daily.     diphenhydrAMINE (BENADRYL) 25 mg capsule Take 50 mg by mouth at bedtime.      doxycycline (ADOXA) 100 MG tablet Take 50 mg by mouth daily.     ezetimibe (ZETIA) 10 MG tablet TAKE 1 TABLET EVERY DAY 90 tablet 2   fluticasone (FLONASE) 50 MCG/ACT nasal spray Place 2 sprays into both nostrils daily. 16 g 6   folic acid (FOLVITE) 1 MG tablet Take 1 mg by mouth daily.     furosemide (LASIX) 40 MG tablet TAKE 1 TABLET EVERY DAY 90 tablet 0   gabapentin (NEURONTIN) 300 MG capsule TAKE 2 CAPSULES THREE TIMES DAILY 540 capsule 1   glucose blood (CONTOUR NEXT TEST) test strip USE TO CHECK GLUCOSE ONCE DAILY  DX: E11.9 100 each 1   Golimumab (SIMPONI ARIA IV) Inject into the vein. Reports receives via infusion from Rheumatology clinic  losartan (COZAAR) 50 MG tablet TAKE 1 TABLET EVERY DAY 90 tablet 0   metFORMIN (GLUCOPHAGE) 500 MG tablet TAKE 2 TABLETS EVERY MORNING AND TAKE 1 TABLET EVERY EVENING 270 tablet 0   methotrexate (RHEUMATREX) 2.5 MG tablet Take 25 mg by mouth every Friday. Caution:Chemotherapy. Protect from light.     Microlet Lancets MISC Use to check Blood sugar Once a day.  EX: E11.9 100 each 1   Omega-3 Fatty Acids (FISH OIL PO) Take 1 capsule by mouth in the morning. 1040 mg of fish oil in each capsule     omeprazole (PRILOSEC) 40 MG capsule Take 1 capsule by mouth once daily 90 capsule 3   predniSONE (DELTASONE) 5 MG tablet Take 5 mg by mouth daily.     RESTASIS 0.05 % ophthalmic emulsion Place 1 drop into both eyes 2 (two)  times daily.     rivaroxaban (XARELTO) 10 MG TABS tablet Take 1 tablet (10 mg total) by mouth daily. 90 tablet 1   rosuvastatin (CRESTOR) 10 MG tablet Take 1 tablet (10 mg total) by mouth daily. 90 tablet 1   Semaglutide (RYBELSUS) 7 MG TABS TAKE 1 TABLET EVERY DAY 90 tablet 0   vitamin C (ASCORBIC ACID) 500 MG tablet Take 1,000 mg by mouth daily.      Zinc 25 MG TABS Take by mouth.     No current facility-administered medications for this visit.    No Known Allergies  Family History  Problem Relation Age of Onset   Cancer Mother        skin   Dementia Mother    Cancer Sister        skin and liver   Stroke Maternal Grandmother    Skin cancer Brother    Diabetes Neg Hx    Heart disease Neg Hx    Breast cancer Neg Hx     Social History   Socioeconomic History   Marital status: Married    Spouse name: Not on file   Number of children: Not on file   Years of education: Not on file   Highest education level: Not on file  Occupational History   Not on file  Tobacco Use   Smoking status: Former    Current packs/day: 0.00    Average packs/day: 4.0 packs/day for 16.0 years (64.0 ttl pk-yrs)    Types: Cigarettes    Start date: 09/12/1979    Quit date: 09/12/1995    Years since quitting: 27.2   Smokeless tobacco: Never   Tobacco comments:    quit in 1997  Vaping Use   Vaping status: Never Used  Substance and Sexual Activity   Alcohol use: No    Alcohol/week: 0.0 standard drinks of alcohol   Drug use: No   Sexual activity: Yes    Birth control/protection: None  Other Topics Concern   Not on file  Social History Narrative   Not on file   Social Determinants of Health   Financial Resource Strain: Low Risk  (04/12/2022)   Overall Financial Resource Strain (CARDIA)    Difficulty of Paying Living Expenses: Not very hard  Food Insecurity: No Food Insecurity (04/12/2022)   Hunger Vital Sign    Worried About Running Out of Food in the Last Year: Never true    Ran Out of Food  in the Last Year: Never true  Transportation Needs: No Transportation Needs (04/12/2022)   PRAPARE - Administrator, Civil Service (Medical): No  Lack of Transportation (Non-Medical): No  Physical Activity: Inactive (04/12/2022)   Exercise Vital Sign    Days of Exercise per Week: 0 days    Minutes of Exercise per Session: 0 min  Stress: No Stress Concern Present (04/12/2022)   [employer] of Occupational Health - Occupational Stress Questionnaire    Feeling of Stress : Not at all  Social Connections: Moderately Integrated (04/12/2022)   Social Connection and Isolation Panel [NHANES]    Frequency of Communication with Friends and Family: Three times a week    Frequency of Social Gatherings with Friends and Family: Once a week    Attends Religious Services: More than 4 times per year    Active Member of [employer] or Organizations: No    Attends Banker Meetings: Never    Marital Status: Married  Catering manager Violence: Not At Risk (04/12/2022)   Humiliation, Afraid, Rape, and Kick questionnaire    Fear of Current or Ex-Partner: No    Emotionally Abused: No    Physically Abused: No    Sexually Abused: No     Constitutional: Denies fever, malaise, fatigue, headache or abrupt weight changes.  HEENT: Denies eye pain, eye redness, ear pain, ringing in the ears, wax buildup, runny nose, nasal congestion, bloody nose, or sore throat. Respiratory: Denies difficulty breathing, shortness of breath, cough or sputum production.   Cardiovascular: Patient reports swelling in legs.  Denies chest pain, chest tightness, palpitations or swelling in the hands.  Gastrointestinal: Denies abdominal pain, bloating, constipation, diarrhea or blood in the stool.  GU: Denies urgency, frequency, pain with urination, burning sensation, blood in urine, odor or discharge. Musculoskeletal: Pt reports low back pain, bilateral knee pain. Denies decrease in range of motion, difficulty with  gait, muscle pain or joint swelling.  Skin: Denies redness, rashes, lesions or ulcercations.  Neurological: Patient reports neuropathic pain of BLE.  Denies dizziness, difficulty with memory, difficulty with speech or problems with balance and coordination.  Psych: Patient has a history of anxiety.  Denies anxiety, depression, SI/HI.  No other specific complaints in a complete review of systems (except as listed in HPI above).  Objective:   Physical Exam   Blood pressure 114/62, pulse 81, temperature (!) 96.6 F (35.9 C), temperature source Temporal, weight 227 lb (103 kg), SpO2 95%.  Wt Readings from Last 3 Encounters:  12/16/22 227 lb (103 kg)  06/07/22 230 lb (104.3 kg)  04/12/22 252 lb (114.3 kg)    General: Appears her stated age, obese, in NAD. Skin: Warm, dry and intact.  Senile purpura noted of BUE.  No ulcerations noted. HEENT: Head: normal shape and size; Eyes: sclera white, no icterus, conjunctiva pink, PERRLA and EOMs intact;  Neck:  Neck supple, trachea midline.  Thyromegaly or thyroid nodule present. Cardiovascular: Normal rate and rhythm. S1,S2 noted.  No murmur, rubs or gallops noted. No JVD or BLE edema. No carotid bruits noted. Pulmonary/Chest: Normal effort and positive vesicular breath sounds. No respiratory distress. No wheezes, rales or ronchi noted.  Abdomen: Normal bowel sounds.  Musculoskeletal: She has difficulty getting from a sitting to a standing position and difficulty getting on the exam table.  Gait slow and steady without device. Neurological: Alert and oriented. Coordination normal.  Psychiatric: Mood and affect normal. Behavior is normal. Judgment and thought content normal.     BMET    Component Value Date/Time   NA 144 06/07/2022 0927   K 4.3 06/07/2022 0927   CL 102 06/07/2022 0927  CO2 23 06/07/2022 0927   GLUCOSE 124 (H) 06/07/2022 0927   GLUCOSE 138 (H) 04/10/2021 1008   BUN 15 06/07/2022 0927   CREATININE 0.96 06/07/2022 0927    CREATININE 0.93 04/10/2021 1008   CALCIUM 9.7 06/07/2022 0927   GFRNONAA >60 04/30/2019 1203   GFRAA >60 04/30/2019 1203    Lipid Panel     Component Value Date/Time   CHOL 144 06/07/2022 0927   TRIG 162 (H) 06/07/2022 0927   HDL 58 06/07/2022 0927   CHOLHDL 2.5 06/07/2022 0927   CHOLHDL 2.9 04/10/2021 1008   VLDL 39.0 12/21/2019 1149   LDLCALC 59 06/07/2022 0927   LDLCALC 74 04/10/2021 1008    CBC    Component Value Date/Time   WBC 8.1 03/07/2022 0912   WBC 7.9 04/10/2021 1008   RBC 4.12 03/07/2022 0912   RBC 4.41 04/10/2021 1008   HGB 12.7 03/07/2022 0912   HCT 38.0 03/07/2022 0912   PLT 298 03/07/2022 0912   MCV 92 03/07/2022 0912   MCH 30.8 03/07/2022 0912   MCH 30.2 04/10/2021 1008   MCHC 33.4 03/07/2022 0912   MCHC 32.7 04/10/2021 1008   RDW 14.3 03/07/2022 0912   LYMPHSABS 2.1 04/30/2019 1203   MONOABS 0.6 04/30/2019 1203   EOSABS 0.2 04/30/2019 1203   BASOSABS 0.0 04/30/2019 1203    Hgb A1C Lab Results  Component Value Date   HGBA1C 6.6 (A) 06/07/2022           Assessment & Plan:     RTC in 6 months for follow-up of chronic conditions Nicki Reaper, NP

## 2022-12-16 NOTE — Assessment & Plan Note (Signed)
Encourage weight loss as this can to reduce joint pain Continue Tylenol, prednisone and gabapentin

## 2022-12-16 NOTE — Assessment & Plan Note (Signed)
Encouraged diet and exercise for weight loss ?

## 2022-12-16 NOTE — Assessment & Plan Note (Signed)
Encouraged her to try the buspirone Support offered

## 2022-12-16 NOTE — Progress Notes (Signed)
Pt due for an U/S for thyroid?  Pt found polyps in her throat

## 2022-12-16 NOTE — Assessment & Plan Note (Signed)
POCT A1c today Will check urine microalbumin Continue metformin as prescribed Will increase Rybelsus to 14 mg daily Continue gabapentin for neuropathic pain Encourage low-carb diet and exercise for weight loss Encouraged routine eye exam Encouraged routine foot exam She declines immunizations today

## 2022-12-18 ENCOUNTER — Ambulatory Visit
Admission: RE | Admit: 2022-12-18 | Discharge: 2022-12-18 | Disposition: A | Discharge status: Home / Self Care | Payer: Medicare Other | Source: Ambulatory Visit | Attending: Internal Medicine | Admitting: Internal Medicine

## 2022-12-18 DIAGNOSIS — Z1231 Encounter for screening mammogram for malignant neoplasm of breast: Secondary | ICD-10-CM | POA: Insufficient documentation

## 2022-12-19 ENCOUNTER — Encounter: Payer: Self-pay | Admitting: Internal Medicine

## 2022-12-23 ENCOUNTER — Ambulatory Visit
Admission: RE | Admit: 2022-12-23 | Discharge: 2022-12-23 | Disposition: A | Discharge status: Home / Self Care | Payer: Medicare Other | Source: Ambulatory Visit | Attending: Internal Medicine | Admitting: Internal Medicine

## 2022-12-23 DIAGNOSIS — E041 Nontoxic single thyroid nodule: Secondary | ICD-10-CM | POA: Diagnosis not present

## 2022-12-30 ENCOUNTER — Other Ambulatory Visit: Payer: Self-pay | Admitting: Internal Medicine

## 2022-12-31 NOTE — Telephone Encounter (Signed)
Requested medication (s) are due for refill today: Yes  Requested medication (s) are on the active medication list: Yes  Last refill:  12/16/22 Doe not look like it went through  Future visit scheduled: Yes  Notes to clinic:  Manual review.    Requested Prescriptions  Pending Prescriptions Disp Refills   RYBELSUS 7 MG TABS [Pharmacy Med Name: RYBELSUS 7 MG Tablet] 90 tablet 3    Sig: TAKE 1 TABLET EVERY DAY     Off-Protocol Failed - 12/30/2022  3:05 AM      Failed - Medication not assigned to a protocol, review manually.      Passed - Valid encounter within last 12 months    Recent Outpatient Visits           2 weeks ago Diabetes 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, unspecified whether long term insulin use [facility])   [place] [facility] [place], [place] W, NP   1 month ago Diabetes 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, unspecified whether long term insulin use (HCC)   [place] [facility] Delles, Gentry Fitz A, RPH-CPP   6 months ago Diabetes 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, unspecified whether long term insulin use [facility])   [place] [facility] [place], [place] W, NP   7 months ago Diabetes 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, unspecified whether long term insulin use (HCC)   [place] [facility] Delles, Gentry Fitz A, RPH-CPP   7 months ago Diabetes 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, unspecified whether long term insulin use (HCC)   [place] [facility] Delles, Jackelyn Poling, RPH-CPP       Future Appointments             In 5 months Baity, Salvadore Oxford, NP [place] [facility]            Signed Prescriptions Disp Refills   rosuvastatin (CRESTOR) 10 MG tablet 90 tablet 3    Sig: TAKE 1  TABLET EVERY DAY     Cardiovascular:  Antilipid - Statins 2 Failed - 12/30/2022  3:05 AM      Failed - Lipid Panel in normal range within the last 12 months    Cholesterol, Total  Date Value Ref Range Status  06/07/2022 144 100 - 199 mg/dL Final   LDL Cholesterol (Calc)  Date Value Ref Range Status  04/10/2021 74 mg/dL (calc) Final    Comment:    Reference range: <100 . Desirable range <100 mg/dL for primary prevention;   <70 mg/dL for patients with CHD or diabetic patients  with > or = 2 CHD risk factors. Marland Kitchen LDL-C is now calculated using the Martin-Hopkins  calculation, which is a validated novel method providing  better accuracy than the Friedewald equation in the  estimation of LDL-C.  Horald Pollen et al. [place] Ahr. 1610;960(45): 2061-2068  (http://education.[facility].com/faq/FAQ164)    LDL Chol Calc (NIH)  Date Value Ref Range Status  06/07/2022 59 0 - 99 mg/dL Final   Direct LDL  Date Value Ref Range Status  12/16/2022 26 <100 mg/dL Final    Comment:    Greatly elevated Triglycerides values (>1200 mg/dL) interfere with the dLDL assay. Marland Kitchen Desirable range <100 mg/dL for primary prevention;   <70 mg/dL for patients with CHD or diabetic patients  with > or = 2 CHD risk  factors. Marland Kitchen    HDL  Date Value Ref Range Status  06/07/2022 58 >39 mg/dL Final   Triglycerides  Date Value Ref Range Status  06/07/2022 162 (H) 0 - 149 mg/dL Final         Passed - Cr in normal range and within 360 days    Creat  Date Value Ref Range Status  12/16/2022 0.83 0.50 - 1.05 mg/dL Final   Creatinine,U  Date Value Ref Range Status  10/29/2016 153.4 mg/dL Final   Creatinine, Urine  Date Value Ref Range Status  12/16/2022 34 20 - 275 mg/dL Final         Passed - Patient is not pregnant      Passed - Valid encounter within last 12 months    Recent Outpatient Visits           2 weeks ago Diabetes 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,  unspecified whether long term insulin use (HCC)   [place] [facility] [place], [place] W, NP   1 month ago Diabetes 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, unspecified whether long term insulin use (HCC)   [place] [facility] Delles, Gentry Fitz A, RPH-CPP   6 months ago Diabetes 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, unspecified whether long term insulin use [facility])   [place] [facility] [place], [place] W, NP   7 months ago Diabetes 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, unspecified whether long term insulin use (HCC)   [place] [facility] Delles, Gentry Fitz A, RPH-CPP   7 months ago Diabetes 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, unspecified whether long term insulin use (HCC)   [place] [facility] Delles, Jackelyn Poling, RPH-CPP       Future Appointments             In 5 months Baity, Salvadore Oxford, NP [place] [facility]             ezetimibe (ZETIA) 10 MG tablet 90 tablet 3    Sig: TAKE 1 TABLET EVERY DAY     Cardiovascular:  Antilipid - Sterol Transport Inhibitors Failed - 12/30/2022  3:05 AM      Failed - Lipid Panel in normal range within the last 12 months    Cholesterol, Total  Date Value Ref Range Status  06/07/2022 144 100 - 199 mg/dL Final   LDL Cholesterol (Calc)  Date Value Ref Range Status  04/10/2021 74 mg/dL (calc) Final    Comment:    Reference range: <100 . Desirable range <100 mg/dL for primary prevention;   <70 mg/dL for patients with CHD or diabetic patients  with > or = 2 CHD risk factors. Marland Kitchen LDL-C is now calculated using the Martin-Hopkins  calculation, which is a validated novel method providing  better accuracy than the Friedewald equation in the  estimation of LDL-C.  Horald Pollen et al. [place] Ahr. 1610;960(45):  2061-2068  (http://education.[facility].com/faq/FAQ164)    LDL Chol Calc (NIH)  Date Value Ref Range Status  06/07/2022 59 0 - 99 mg/dL Final   Direct LDL  Date Value Ref Range Status  12/16/2022 26 <100 mg/dL Final    Comment:    Greatly elevated Triglycerides values (>1200 mg/dL) interfere with the dLDL assay. Marland Kitchen Desirable range <100 mg/dL for primary prevention;   <70 mg/dL for patients with CHD or  diabetic patients  with > or = 2 CHD risk factors. Marland Kitchen    HDL  Date Value Ref Range Status  06/07/2022 58 >39 mg/dL Final   Triglycerides  Date Value Ref Range Status  06/07/2022 162 (H) 0 - 149 mg/dL Final         Passed - AST in normal range and within 360 days    AST  Date Value Ref Range Status  12/16/2022 20 10 - 35 U/L Final         Passed - ALT in normal range and within 360 days    ALT  Date Value Ref Range Status  12/16/2022 21 6 - 29 U/L Final         Passed - Patient is not pregnant      Passed - Valid encounter within last 12 months    Recent Outpatient Visits           2 weeks ago Diabetes 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, unspecified whether long term insulin use (HCC)   [place] [facility] [place], [place] W, NP   1 month ago Diabetes 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, unspecified whether long term insulin use (HCC)   [place] [facility] Delles, Gentry Fitz A, RPH-CPP   6 months ago Diabetes 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, unspecified whether long term insulin use [facility])   [place] [facility] [place], [place] W, NP   7 months ago Diabetes 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, unspecified whether long term insulin use (HCC)   [place] [facility] Delles, Gentry Fitz A, RPH-CPP   7 months ago Diabetes 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  condition with diabetic autonomic neuropathy, unspecified whether long term insulin use (HCC)   [place] [facility] Delles, Jackelyn Poling, RPH-CPP       Future Appointments             In 5 months Baity, Salvadore Oxford, NP [place] [facility]

## 2022-12-31 NOTE — Telephone Encounter (Signed)
Requested Prescriptions  Pending Prescriptions Disp Refills   rosuvastatin (CRESTOR) 10 MG tablet [Pharmacy Med Name: ROSUVASTATIN CALCIUM 10 MG Tablet] 90 tablet 3    Sig: TAKE 1 TABLET EVERY DAY     Cardiovascular:  Antilipid - Statins 2 Failed - 12/30/2022  3:05 AM      Failed - Lipid Panel in normal range within the last 12 months    Cholesterol, Total  Date Value Ref Range Status  06/07/2022 144 100 - 199 mg/dL Final   LDL Cholesterol (Calc)  Date Value Ref Range Status  04/10/2021 74 mg/dL (calc) Final    Comment:    Reference range: <100 . Desirable range <100 mg/dL for primary prevention;   <70 mg/dL for patients with CHD or diabetic patients  with > or = 2 CHD risk factors. Marland Kitchen LDL-C is now calculated using the Martin-Hopkins  calculation, which is a validated novel method providing  better accuracy than the Friedewald equation in the  estimation of LDL-C.  Horald Pollen et al. Lenox Ahr. 1478;295(62): 2061-2068  (http://education.[facility].com/faq/FAQ164)    LDL Chol Calc (NIH)  Date Value Ref Range Status  06/07/2022 59 0 - 99 mg/dL Final   Direct LDL  Date Value Ref Range Status  12/16/2022 26 <100 mg/dL Final    Comment:    Greatly elevated Triglycerides values (>1200 mg/dL) interfere with the dLDL assay. Marland Kitchen Desirable range <100 mg/dL for primary prevention;   <70 mg/dL for patients with CHD or diabetic patients  with > or = 2 CHD risk factors. Marland Kitchen    HDL  Date Value Ref Range Status  06/07/2022 58 >39 mg/dL Final   Triglycerides  Date Value Ref Range Status  06/07/2022 162 (H) 0 - 149 mg/dL Final         Passed - Cr in normal range and within 360 days    Creat  Date Value Ref Range Status  12/16/2022 0.83 0.50 - 1.05 mg/dL Final   Creatinine,U  Date Value Ref Range Status  10/29/2016 153.4 mg/dL Final   Creatinine, Urine  Date Value Ref Range Status  12/16/2022 34 20 - 275 mg/dL Final         Passed - Patient is not pregnant      Passed -  Valid encounter within last 12 months    Recent Outpatient Visits           2 weeks ago Diabetes 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, unspecified whether long term insulin use (HCC)   [place] [facility] [place], [place] W, NP   1 month ago Diabetes 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, unspecified whether long term insulin use (HCC)   [place] [facility] Delles, Gentry Fitz A, RPH-CPP   6 months ago Diabetes 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, unspecified whether long term insulin use [facility])   [place] [facility], [place] W, NP   7 months ago Diabetes 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, unspecified whether long term insulin use [facility])   [place] [facility] Delles, Gentry Fitz A, RPH-CPP   7 months ago Diabetes 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, unspecified whether long term insulin use [facility])   [place] [facility] Delles, Jackelyn Poling, RPH-CPP       Future Appointments             In  5 months Baity, Salvadore [place], NP [place]             ezetimibe (ZETIA) 10 MG tablet [Pharmacy Med Name: EZETIMIBE 10 MG Tablet] 90 tablet 3    Sig: TAKE 1 TABLET EVERY DAY     Cardiovascular:  Antilipid - Sterol Transport Inhibitors Failed - 12/30/2022  3:05 AM      Failed - Lipid Panel in normal range within the last 12 months    Cholesterol, Total  Date Value Ref Range Status  06/07/2022 144 100 - 199 mg/dL Final   LDL Cholesterol (Calc)  Date Value Ref Range Status  04/10/2021 74 mg/dL (calc) Final    Comment:    Reference range: <100 . Desirable range <100 mg/dL for primary prevention;   <70 mg/dL for patients with CHD or diabetic patients  with > or = 2 CHD risk factors. Marland Kitchen LDL-C is  now calculated using the Martin-Hopkins  calculation, which is a validated novel method providing  better accuracy than the Friedewald equation in the  estimation of LDL-C.  Horald Pollen et al. Lenox Ahr. 2952;841(32): 2061-2068  (http://education.[facility].com/faq/FAQ164)    LDL Chol Calc (NIH)  Date Value Ref Range Status  06/07/2022 59 0 - 99 mg/dL Final   Direct LDL  Date Value Ref Range Status  12/16/2022 26 <100 mg/dL Final    Comment:    Greatly elevated Triglycerides values (>1200 mg/dL) interfere with the dLDL assay. Marland Kitchen Desirable range <100 mg/dL for primary prevention;   <70 mg/dL for patients with CHD or diabetic patients  with > or = 2 CHD risk factors. Marland Kitchen    HDL  Date Value Ref Range Status  06/07/2022 58 >39 mg/dL Final   Triglycerides  Date Value Ref Range Status  06/07/2022 162 (H) 0 - 149 mg/dL Final         Passed - AST in normal range and within 360 days    AST  Date Value Ref Range Status  12/16/2022 20 10 - 35 U/L Final         Passed - ALT in normal range and within 360 days    ALT  Date Value Ref Range Status  12/16/2022 21 6 - 29 U/L Final         Passed - Patient is not pregnant      Passed - Valid encounter within last 12 months    Recent Outpatient Visits           2 weeks ago Diabetes 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, unspecified whether long term insulin use (HCC)   [place] [facility] [place], [place] W, NP   1 month ago Diabetes 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, unspecified whether long term insulin use (HCC)   [place] [facility] Delles, Gentry Fitz A, RPH-CPP   6 months ago Diabetes 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, unspecified whether long term insulin use [facility])   [place] [facility] [place], [place] W, NP   7 months ago Diabetes 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  condition with diabetic autonomic neuropathy, unspecified whether long term insulin use [facility])   [place] [facility] Delles, Gentry Fitz A, RPH-CPP   7 months ago Diabetes 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, unspecified whether long term insulin use [facility])    [facility] Delles, Gentry Fitz A, RPH-CPP  Future Appointments             In 5 months Baity, Salvadore Oxford, NP [place] [facility]             RYBELSUS 7 MG TABS [Pharmacy Med Name: RYBELSUS 7 MG Tablet] 90 tablet 3    Sig: TAKE 1 TABLET EVERY DAY     Off-Protocol Failed - 12/30/2022  3:05 AM      Failed - Medication not assigned to a protocol, review manually.      Passed - Valid encounter within last 12 months    Recent Outpatient Visits           2 weeks ago Diabetes 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, unspecified whether long term insulin use [facility])   [place] [facility] [place], [place] W, NP   1 month ago Diabetes 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, unspecified whether long term insulin use (HCC)   [place] [facility] Delles, Gentry Fitz A, RPH-CPP   6 months ago Diabetes 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, unspecified whether long term insulin use [facility])   [place] [facility] [place], [place] W, NP   7 months ago Diabetes 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, unspecified whether long term insulin use [facility])   [place] [facility] Delles, Gentry Fitz A, RPH-CPP   7 months ago Diabetes 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, unspecified whether long term insulin use (HCC)   [place] [facility] Delles, Jackelyn Poling, RPH-CPP       Future Appointments              In 5 months Baity, Salvadore Oxford, NP [place] [facility], [facility]

## 2023-01-07 ENCOUNTER — Encounter: Payer: Self-pay | Admitting: Internal Medicine

## 2023-01-07 NOTE — Telephone Encounter (Signed)
Please review.  KP

## 2023-01-08 ENCOUNTER — Other Ambulatory Visit: Payer: Self-pay | Admitting: Internal Medicine

## 2023-01-09 NOTE — Telephone Encounter (Signed)
Requested by interface surescripts. Future visit in 5 months.  Requested Prescriptions  Pending Prescriptions Disp Refills   busPIRone (BUSPAR) 5 MG tablet [Pharmacy Med Name: BUSPIRONE HYDROCHLORIDE 5 MG Tablet] 90 tablet 2    Sig: TAKE 1 TABLET EVERY DAY AS NEEDED     Psychiatry: Anxiolytics/Hypnotics - Non-controlled Passed - 01/08/2023  2:26 AM      Passed - Valid encounter within last 12 months    Recent Outpatient Visits           3 weeks ago Diabetes 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, unspecified whether long term insulin use [facility])   [place] [facility] [place], [place] W, NP   1 month ago Diabetes 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, unspecified whether long term insulin use (HCC)   [place] [facility] Delles, Gentry Fitz A, RPH-CPP   7 months ago Diabetes 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, unspecified whether long term insulin use [facility])   [place] [facility] [place], [place] W, NP   7 months ago Diabetes 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, unspecified whether long term insulin use [facility])   [place] [facility] Delles, Gentry Fitz A, RPH-CPP   7 months ago Diabetes 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, unspecified whether long term insulin use (HCC)   [place] Delles, Jackelyn Poling, RPH-CPP       Future Appointments             In 5 months Baity, Salvadore Oxford, NP [place] [facility], [facility]

## 2023-01-19 ENCOUNTER — Other Ambulatory Visit: Payer: Self-pay | Admitting: Internal Medicine

## 2023-01-21 NOTE — Telephone Encounter (Signed)
Requested Prescriptions  Pending Prescriptions Disp Refills   metFORMIN (GLUCOPHAGE) 500 MG tablet [Pharmacy Med Name: metFORMIN HCl Oral Tablet 500 MG] 270 tablet 0    Sig: TAKE 2 TABLETS EVERY MORNING AND TAKE 1 TABLET EVERY EVENING     Endocrinology:  Diabetes - Biguanides Failed - 01/19/2023 10:39 AM      Failed - B12 Level in normal range and within 720 days    No results found for: "VITAMINB12"       Failed - CBC within normal limits and completed in the last 12 months    WBC  Date Value Ref Range Status  12/16/2022 5.8 3.8 - 10.8 Thousand/uL Final   RBC  Date Value Ref Range Status  12/16/2022 4.03 3.80 - 5.10 Million/uL Final   Hemoglobin  Date Value Ref Range Status  12/16/2022 13.1 11.7 - 15.5 g/dL Final  16/02/9603 54.0 11.1 - 15.9 g/dL Final   HCT  Date Value Ref Range Status  12/16/2022 39.2 35.0 - 45.0 % Final   Hematocrit  Date Value Ref Range Status  03/07/2022 38.0 34.0 - 46.6 % Final   MCHC  Date Value Ref Range Status  12/16/2022 33.4 32.0 - 36.0 g/dL Final   [facility]  Date Value Ref Range Status  12/16/2022 32.5 27.0 - 33.0 pg Final   MCV  Date Value Ref Range Status  12/16/2022 97.3 80.0 - 100.0 fL Final  03/07/2022 92 79 - 97 fL Final   No results found for: "PLTCOUNTKUC", "LABPLAT", "POCPLA" RDW  Date Value Ref Range Status  12/16/2022 14.2 11.0 - 15.0 % Final  03/07/2022 14.3 11.7 - 15.4 % Final         Passed - Cr in normal range and within 360 days    Creat  Date Value Ref Range Status  12/16/2022 0.83 0.50 - 1.05 mg/dL Final   Creatinine,U  Date Value Ref Range Status  10/29/2016 153.4 mg/dL Final   Creatinine, Urine  Date Value Ref Range Status  12/16/2022 34 20 - 275 mg/dL Final         Passed - HBA1C is between 0 and 7.9 and within 180 days    Hemoglobin A1C  Date Value Ref Range Status  12/16/2022 6.3 (A) 4.0 - 5.6 % Final   Hgb A1c MFr Bld  Date Value Ref Range Status  04/10/2021 7.1 (H) <5.7 % of total Hgb Final     Comment:    For someone without known diabetes, a hemoglobin A1c value of 6.5% or greater indicates that they may have  diabetes and this should be confirmed with a follow-up  test. . For someone with known diabetes, a value <7% indicates  that their diabetes is well controlled and a value  greater than or equal to 7% indicates suboptimal  control. A1c targets should be individualized based on  duration of diabetes, age, comorbid conditions, and  other considerations. . Currently, no consensus exists regarding use of hemoglobin A1c for diagnosis of diabetes for children. .          Passed - eGFR in normal range and within 360 days    GFR calc Af Amer  Date Value Ref Range Status  04/30/2019 >60 >60 mL/min Final   GFR calc non Af Amer  Date Value Ref Range Status  04/30/2019 >60 >60 mL/min Final   GFR  Date Value Ref Range Status  12/21/2019 53.40 (L) >60.00 mL/min Final   eGFR  Date Value Ref Range Status  12/16/2022 78 > OR = 60 mL/min/1.26m2 Final  06/07/2022 65 >59 mL/min/1.73 Final         Passed - Valid encounter within last 6 months    Recent Outpatient Visits           1 month ago Diabetes 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, unspecified whether long term insulin use (HCC)   [place] [facility] [place], [place], NP   1 month ago Diabetes 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, unspecified whether long term insulin use (HCC)   [place] [facility] Delles, Gentry Fitz A, RPH-CPP   7 months ago Diabetes 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, unspecified whether long term insulin use (HCC)   [place] [facility] [place], [place], NP   8 months ago Diabetes 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, unspecified whether long term insulin use (HCC)   [place] [facility] Delles, Gentry Fitz A, RPH-CPP   8 months ago Diabetes 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, unspecified whether long term insulin use (HCC)   [place] [facility] Delles, Jackelyn Poling, RPH-CPP       Future Appointments             In 4 months Baity, Salvadore Oxford, NP [place] [facility]             furosemide (LASIX) 40 MG tablet [Pharmacy Med Name: Furosemide Oral Tablet 40 MG] 90 tablet 0    Sig: TAKE 1 TABLET EVERY DAY     Cardiovascular:  Diuretics - Loop Failed - 01/19/2023 10:39 AM      Failed - Mg Level in normal range and within 180 days    No results found for: "MG"       Passed - K in normal range and within 180 days    Potassium  Date Value Ref Range Status  12/16/2022 4.5 3.5 - 5.3 mmol/L Final         Passed - Ca in normal range and within 180 days    Calcium  Date Value Ref Range Status  12/16/2022 9.2 8.6 - 10.4 mg/dL Final         Passed - Na in normal range and within 180 days    Sodium  Date Value Ref Range Status  12/16/2022 141 135 - 146 mmol/L Final  06/07/2022 144 134 - 144 mmol/L Final         Passed - Cr in normal range and within 180 days    Creat  Date Value Ref Range Status  12/16/2022 0.83 0.50 - 1.05 mg/dL Final   Creatinine,U  Date Value Ref Range Status  10/29/2016 153.4 mg/dL Final   Creatinine, Urine  Date Value Ref Range Status  12/16/2022 34 20 - 275 mg/dL Final         Passed - Cl in normal range and within 180 days    Chloride  Date Value Ref Range Status  12/16/2022 103 98 - 110 mmol/L Final         Passed - Last BP in normal range    BP Readings from Last 1 Encounters:  12/16/22 114/62         Passed - Valid encounter within last 6 months    Recent Outpatient Visits  1 month ago Diabetes 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, unspecified whether long term insulin use (HCC)   [place]  [facility] [place], [place], NP   1 month ago Diabetes 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, unspecified whether long term insulin use (HCC)   [place] [facility] Delles, Gentry Fitz A, RPH-CPP   7 months ago Diabetes 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, unspecified whether long term insulin use (HCC)   [place] [facility] [place], [place], NP   8 months ago Diabetes 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, unspecified whether long term insulin use (HCC)   [place] [facility] Delles, Gentry Fitz A, RPH-CPP   8 months ago Diabetes 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, unspecified whether long term insulin use (HCC)   [place] [facility] Delles, Jackelyn Poling, RPH-CPP       Future Appointments             In 4 months Baity, Salvadore Oxford, NP [place] [facility]             losartan (COZAAR) 50 MG tablet [Pharmacy Med Name: Losartan Potassium Oral Tablet 50 MG] 90 tablet 0    Sig: TAKE 1 TABLET EVERY DAY     Cardiovascular:  Angiotensin Receptor Blockers Passed - 01/19/2023 10:39 AM      Passed - Cr in normal range and within 180 days    Creat  Date Value Ref Range Status  12/16/2022 0.83 0.50 - 1.05 mg/dL Final   Creatinine,U  Date Value Ref Range Status  10/29/2016 153.4 mg/dL Final   Creatinine, Urine  Date Value Ref Range Status  12/16/2022 34 20 - 275 mg/dL Final         Passed - K in normal range and within 180 days    Potassium  Date Value Ref Range Status  12/16/2022 4.5 3.5 - 5.3 mmol/L Final         Passed - Patient is not pregnant      Passed - Last BP in normal range    BP Readings from Last 1 Encounters:  12/16/22 114/62         Passed - Valid encounter within last 6 months    Recent Outpatient Visits            1 month ago Diabetes 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, unspecified whether long term insulin use (HCC)   [place] [facility] [place], [place], NP   1 month ago Diabetes 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, unspecified whether long term insulin use (HCC)   [place] [facility] Delles, Gentry Fitz A, RPH-CPP   7 months ago Diabetes 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, unspecified whether long term insulin use [facility])   [place] [facility] [place], [place] W, NP   8 months ago Diabetes 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, unspecified whether long term insulin use [facility])   [place] [facility] Delles, Gentry Fitz A, RPH-CPP   8 months ago Diabetes 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, unspecified whether long term insulin use [facility])    [facility] Delles, Gentry Fitz A, RPH-CPP  Future Appointments             In 4 months Baity, Salvadore Oxford, NP [place] [facility], [facility]

## 2023-01-29 ENCOUNTER — Other Ambulatory Visit: Payer: Self-pay | Admitting: Internal Medicine

## 2023-01-30 NOTE — Telephone Encounter (Signed)
Requested Prescriptions  Pending Prescriptions Disp Refills   XARELTO 10 MG TABS tablet [Pharmacy Med Name: Xarelto Oral Tablet 10 MG] 90 tablet 3    Sig: TAKE 1 TABLET EVERY DAY     Hematology: Anticoagulants - rivaroxaban Passed - 01/29/2023  2:37 AM      Passed - ALT in normal range and within 360 days    ALT  Date Value Ref Range Status  12/16/2022 21 6 - 29 U/L Final         Passed - AST in normal range and within 360 days    AST  Date Value Ref Range Status  12/16/2022 20 10 - 35 U/L Final         Passed - Cr in normal range and within 360 days    Creat  Date Value Ref Range Status  12/16/2022 0.83 0.50 - 1.05 mg/dL Final   Creatinine,U  Date Value Ref Range Status  10/29/2016 153.4 mg/dL Final   Creatinine, Urine  Date Value Ref Range Status  12/16/2022 34 20 - 275 mg/dL Final         Passed - HCT in normal range and within 360 days    HCT  Date Value Ref Range Status  12/16/2022 39.2 35.0 - 45.0 % Final   Hematocrit  Date Value Ref Range Status  03/07/2022 38.0 34.0 - 46.6 % Final         Passed - HGB in normal range and within 360 days    Hemoglobin  Date Value Ref Range Status  12/16/2022 13.1 11.7 - 15.5 g/dL Final  29/52/8413 24.4 11.1 - 15.9 g/dL Final         Passed - PLT in normal range and within 360 days    Platelets  Date Value Ref Range Status  12/16/2022 294 140 - 400 Thousand/uL Final  03/07/2022 298 150 - 450 x10E3/uL Final         Passed - eGFR is 15 or above and within 360 days    GFR calc Af Amer  Date Value Ref Range Status  04/30/2019 >60 >60 mL/min Final   GFR calc non Af Amer  Date Value Ref Range Status  04/30/2019 >60 >60 mL/min Final   GFR  Date Value Ref Range Status  12/21/2019 53.40 (L) >60.00 mL/min Final   eGFR  Date Value Ref Range Status  12/16/2022 78 > OR = 60 mL/min/1.12m2 Final  06/07/2022 65 >59 mL/min/1.73 Final         Passed - Patient is not pregnant      Passed - Valid encounter within last 12  months    Recent Outpatient Visits           1 month ago Diabetes 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, unspecified whether long term insulin use (HCC)   [place] [facility] [place], [place], NP   2 months ago Diabetes 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, unspecified whether long term insulin use (HCC)   [place] [facility] Delles, Gentry Fitz A, RPH-CPP   7 months ago Diabetes 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, unspecified whether long term insulin use [facility])   [place] [facility] [place], [place], NP   8 months ago Diabetes 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, unspecified whether long term insulin use [facility])   [place] [facility] Delles, Gentry Fitz A,  RPH-CPP   8 months ago Diabetes 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, unspecified whether long term insulin use (HCC)   [place] [facility] Delles, Jackelyn Poling, RPH-CPP       Future Appointments             In 4 months Baity, Salvadore Oxford, NP [place] [facility], [facility]

## 2023-03-06 ENCOUNTER — Other Ambulatory Visit: Payer: Self-pay | Admitting: Internal Medicine

## 2023-03-07 NOTE — Telephone Encounter (Signed)
Requested Prescriptions  Pending Prescriptions Disp Refills   gabapentin (NEURONTIN) 300 MG capsule [Pharmacy Med Name: Gabapentin Oral Capsule 300 MG] 540 capsule 3    Sig: TAKE 2 CAPSULES THREE TIMES DAILY     Neurology: Anticonvulsants - gabapentin Passed - 03/06/2023  5:31 PM      Passed - Cr in normal range and within 360 days    Creat  Date Value Ref Range Status  12/16/2022 0.83 0.50 - 1.05 mg/dL Final   Creatinine,U  Date Value Ref Range Status  10/29/2016 153.4 mg/dL Final   Creatinine, Urine  Date Value Ref Range Status  12/16/2022 34 20 - 275 mg/dL Final         Passed - Completed PHQ-2 or PHQ-9 in the last 360 days      Passed - Valid encounter within last 12 months    Recent Outpatient Visits           2 months ago Diabetes 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, unspecified whether long term insulin use (HCC)   [facility] [place], [place] W, NP   3 months ago Diabetes 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, unspecified whether long term insulin use (HCC)   [place] [facility] Delles, Gentry Fitz A, RPH-CPP   9 months ago Diabetes 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, unspecified whether long term insulin use [facility])   [place] [facility] [place], [place], NP   9 months ago Diabetes 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, unspecified whether long term insulin use [facility])   [place] [facility] Delles, Gentry Fitz A, RPH-CPP   9 months ago Diabetes 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, unspecified whether long term insulin use (HCC)   [place] [facility] Delles, Jackelyn Poling, RPH-CPP       Future Appointments             In 3 months Baity, Salvadore Oxford, NP [place] [facility], [facility]

## 2023-04-04 ENCOUNTER — Other Ambulatory Visit: Payer: Self-pay | Admitting: Internal Medicine

## 2023-04-04 NOTE — Telephone Encounter (Signed)
Requested by interface surescripts. Future visit in 2 months.  Requested Prescriptions  Pending Prescriptions Disp Refills   metFORMIN (GLUCOPHAGE) 500 MG tablet [Pharmacy Med Name: metFORMIN HCl Oral Tablet 500 MG] 270 tablet 1    Sig: TAKE 2 TABLETS EVERY MORNING AND TAKE 1 TABLET EVERY EVENING     Endocrinology:  Diabetes - Biguanides Failed - 04/04/2023  2:30 AM      Failed - B12 Level in normal range and within 720 days    No results found for: "VITAMINB12"       Failed - CBC within normal limits and completed in the last 12 months    WBC  Date Value Ref Range Status  12/16/2022 5.8 3.8 - 10.8 Thousand/uL Final   RBC  Date Value Ref Range Status  12/16/2022 4.03 3.80 - 5.10 Million/uL Final   Hemoglobin  Date Value Ref Range Status  12/16/2022 13.1 11.7 - 15.5 g/dL Final  29/52/8413 24.4 11.1 - 15.9 g/dL Final   HCT  Date Value Ref Range Status  12/16/2022 39.2 35.0 - 45.0 % Final   Hematocrit  Date Value Ref Range Status  03/07/2022 38.0 34.0 - 46.6 % Final   MCHC  Date Value Ref Range Status  12/16/2022 33.4 32.0 - 36.0 g/dL Final   [facility]  Date Value Ref Range Status  12/16/2022 32.5 27.0 - 33.0 pg Final   MCV  Date Value Ref Range Status  12/16/2022 97.3 80.0 - 100.0 fL Final  03/07/2022 92 79 - 97 fL Final   No results found for: "PLTCOUNTKUC", "LABPLAT", "POCPLA" RDW  Date Value Ref Range Status  12/16/2022 14.2 11.0 - 15.0 % Final  03/07/2022 14.3 11.7 - 15.4 % Final         Passed - Cr in normal range and within 360 days    Creat  Date Value Ref Range Status  12/16/2022 0.83 0.50 - 1.05 mg/dL Final   Creatinine,U  Date Value Ref Range Status  10/29/2016 153.4 mg/dL Final   Creatinine, Urine  Date Value Ref Range Status  12/16/2022 34 20 - 275 mg/dL Final         Passed - HBA1C is between 0 and 7.9 and within 180 days    Hemoglobin A1C  Date Value Ref Range Status  12/16/2022 6.3 (A) 4.0 - 5.6 % Final   Hgb A1c MFr Bld  Date Value Ref  Range Status  04/10/2021 7.1 (H) <5.7 % of total Hgb Final    Comment:    For someone without known diabetes, a hemoglobin A1c value of 6.5% or greater indicates that they may have  diabetes and this should be confirmed with a follow-up  test. . For someone with known diabetes, a value <7% indicates  that their diabetes is well controlled and a value  greater than or equal to 7% indicates suboptimal  control. A1c targets should be individualized based on  duration of diabetes, age, comorbid conditions, and  other considerations. . Currently, no consensus exists regarding use of hemoglobin A1c for diagnosis of diabetes for children. .          Passed - eGFR in normal range and within 360 days    GFR calc Af Amer  Date Value Ref Range Status  04/30/2019 >60 >60 mL/min Final   GFR calc non Af Amer  Date Value Ref Range Status  04/30/2019 >60 >60 mL/min Final   GFR  Date Value Ref Range Status  12/21/2019 53.40 (L) >60.00 mL/min  Final   eGFR  Date Value Ref Range Status  12/16/2022 78 > OR = 60 mL/min/1.56m2 Final  06/07/2022 65 >59 mL/min/1.73 Final         Passed - Valid encounter within last 6 months    Recent Outpatient Visits           3 months ago Diabetes 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, unspecified whether long term insulin use (HCC)   [place] [facility] [place], [place], NP   4 months ago Diabetes 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, unspecified whether long term insulin use (HCC)   [place] [facility] Delles, Gentry Fitz A, RPH-CPP   10 months ago Diabetes 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, unspecified whether long term insulin use (HCC)   [place] [facility] [place], [place] W, NP   10 months ago Diabetes 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, unspecified  whether long term insulin use (HCC)   [place] [facility] Delles, Gentry Fitz A, RPH-CPP   10 months ago Diabetes 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, unspecified whether long term insulin use (HCC)   [place] [facility] Delles, Jackelyn Poling, RPH-CPP       Future Appointments             In 2 months Baity, Salvadore Oxford, NP [place] [facility]             furosemide (LASIX) 40 MG tablet [Pharmacy Med Name: Furosemide Oral Tablet 40 MG] 90 tablet 1    Sig: TAKE 1 TABLET EVERY DAY     Cardiovascular:  Diuretics - Loop Failed - 04/04/2023  2:30 AM      Failed - Mg Level in normal range and within 180 days    No results found for: "MG"       Passed - K in normal range and within 180 days    Potassium  Date Value Ref Range Status  12/16/2022 4.5 3.5 - 5.3 mmol/L Final         Passed - Ca in normal range and within 180 days    Calcium  Date Value Ref Range Status  12/16/2022 9.2 8.6 - 10.4 mg/dL Final         Passed - Na in normal range and within 180 days    Sodium  Date Value Ref Range Status  12/16/2022 141 135 - 146 mmol/L Final  06/07/2022 144 134 - 144 mmol/L Final         Passed - Cr in normal range and within 180 days    Creat  Date Value Ref Range Status  12/16/2022 0.83 0.50 - 1.05 mg/dL Final   Creatinine,U  Date Value Ref Range Status  10/29/2016 153.4 mg/dL Final   Creatinine, Urine  Date Value Ref Range Status  12/16/2022 34 20 - 275 mg/dL Final         Passed - Cl in normal range and within 180 days    Chloride  Date Value Ref Range Status  12/16/2022 103 98 - 110 mmol/L Final         Passed - Last BP in normal range    BP Readings from Last 1 Encounters:  12/16/22 114/62         Passed - Valid encounter within last 6 months  Recent Outpatient Visits           3 months ago Diabetes 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 autonomic  neuropathy, unspecified whether long term insulin use (HCC)   [place] [facility] [place], [place], NP   4 months ago Diabetes 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, unspecified whether long term insulin use (HCC)   [place] [facility] Delles, Gentry Fitz A, RPH-CPP   10 months ago Diabetes 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, unspecified whether long term insulin use (HCC)   [place] [facility] [place], [place], NP   10 months ago Diabetes 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, unspecified whether long term insulin use (HCC)   [place] [facility] Delles, Gentry Fitz A, RPH-CPP   10 months ago Diabetes 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, unspecified whether long term insulin use (HCC)   [place] [facility] Delles, Jackelyn Poling, RPH-CPP       Future Appointments             In 2 months Baity, Salvadore Oxford, NP [facility]             losartan (COZAAR) 50 MG tablet [Pharmacy Med Name: Losartan Potassium Oral Tablet 50 MG] 90 tablet 1    Sig: TAKE 1 TABLET EVERY DAY     Cardiovascular:  Angiotensin Receptor Blockers Passed - 04/04/2023  2:30 AM      Passed - Cr in normal range and within 180 days    Creat  Date Value Ref Range Status  12/16/2022 0.83 0.50 - 1.05 mg/dL Final   Creatinine,U  Date Value Ref Range Status  10/29/2016 153.4 mg/dL Final   Creatinine, Urine  Date Value Ref Range Status  12/16/2022 34 20 - 275 mg/dL Final         Passed - K in normal range and within 180 days    Potassium  Date Value Ref Range Status  12/16/2022 4.5 3.5 - 5.3 mmol/L Final         Passed - Patient is not pregnant      Passed - Last BP in normal range    BP Readings from Last 1 Encounters:  12/16/22 114/62          Passed - Valid encounter within last 6 months    Recent Outpatient Visits           3 months ago Diabetes 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, unspecified whether long term insulin use (HCC)   [facility] [place], Salvadore Oxford, NP   4 months ago Diabetes 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, unspecified whether long term insulin use (HCC)   [place] [facility] Delles, Gentry Fitz A, RPH-CPP   10 months ago Diabetes 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, unspecified whether long term insulin use [facility])   [place] [facility] [place], [place], NP   10 months ago Diabetes 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, unspecified whether long term insulin use [facility])   [place] [facility] Delles, Gentry Fitz A, RPH-CPP   10 months ago Diabetes 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, unspecified whether long term insulin use (HCC)   [facility] Delles, Jackelyn Poling, RPH-CPP       Future Appointments             In 2 months Baity, Salvadore Oxford, NP [place] [facility], [place]

## 2023-04-11 ENCOUNTER — Other Ambulatory Visit: Payer: Self-pay | Admitting: Internal Medicine

## 2023-04-13 ENCOUNTER — Other Ambulatory Visit: Payer: Self-pay | Admitting: Internal Medicine

## 2023-04-15 NOTE — Telephone Encounter (Signed)
Requested Prescriptions  Pending Prescriptions Disp Refills   omeprazole (PRILOSEC) 40 MG capsule [Pharmacy Med Name: Omeprazole 40 MG Oral Capsule Delayed Release] 90 capsule 0    Sig: Take 1 capsule by mouth once daily     Gastroenterology: Proton Pump Inhibitors Passed - 04/11/2023  6:51 AM      Passed - Valid encounter within last 12 months    Recent Outpatient Visits           4 months ago Diabetes 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, unspecified whether long term insulin use [facility])   [place] [facility] [place], [place], NP   4 months ago Diabetes 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, unspecified whether long term insulin use (HCC)   [place] [facility] Delles, Gentry Fitz A, RPH-CPP   10 months ago Diabetes 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, unspecified whether long term insulin use [facility])   [place] [facility] [place], [place], NP   10 months ago 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 [facility] Delles, Gentry Fitz A, RPH-CPP   10 months ago Diabetes 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, unspecified whether long term insulin use (HCC)   [place] [facility] Delles, Jackelyn Poling, RPH-CPP       Future Appointments             In 2 months Baity, Salvadore Oxford, NP [facility], [facility]

## 2023-04-16 NOTE — Telephone Encounter (Signed)
Requested by interface surescripst. Future visit in 2 months.  Requested Prescriptions  Pending Prescriptions Disp Refills   rivaroxaban (XARELTO) 10 MG TABS tablet [Pharmacy Med Name: Xarelto Oral Tablet 10 MG] 90 tablet 0    Sig: TAKE 1 TABLET EVERY DAY     Hematology: Anticoagulants - rivaroxaban Passed - 04/13/2023 10:20 AM      Passed - ALT in normal range and within 360 days    ALT  Date Value Ref Range Status  12/16/2022 21 6 - 29 U/L Final         Passed - AST in normal range and within 360 days    AST  Date Value Ref Range Status  12/16/2022 20 10 - 35 U/L Final         Passed - Cr in normal range and within 360 days    Creat  Date Value Ref Range Status  12/16/2022 0.83 0.50 - 1.05 mg/dL Final   Creatinine,U  Date Value Ref Range Status  10/29/2016 153.4 mg/dL Final   Creatinine, Urine  Date Value Ref Range Status  12/16/2022 34 20 - 275 mg/dL Final         Passed - HCT in normal range and within 360 days    HCT  Date Value Ref Range Status  12/16/2022 39.2 35.0 - 45.0 % Final   Hematocrit  Date Value Ref Range Status  03/07/2022 38.0 34.0 - 46.6 % Final         Passed - HGB in normal range and within 360 days    Hemoglobin  Date Value Ref Range Status  12/16/2022 13.1 11.7 - 15.5 g/dL Final  53/66/4403 47.4 11.1 - 15.9 g/dL Final         Passed - PLT in normal range and within 360 days    Platelets  Date Value Ref Range Status  12/16/2022 294 140 - 400 Thousand/uL Final  03/07/2022 298 150 - 450 x10E3/uL Final         Passed - eGFR is 15 or above and within 360 days    GFR calc Af Amer  Date Value Ref Range Status  04/30/2019 >60 >60 mL/min Final   GFR calc non Af Amer  Date Value Ref Range Status  04/30/2019 >60 >60 mL/min Final   GFR  Date Value Ref Range Status  12/21/2019 53.40 (L) >60.00 mL/min Final   eGFR  Date Value Ref Range Status  12/16/2022 78 > OR = 60 mL/min/1.41m2 Final  06/07/2022 65 >59 mL/min/1.73 Final          Passed - Patient is not pregnant      Passed - Valid encounter within last 12 months    Recent Outpatient Visits           4 months ago Diabetes 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, unspecified whether long term insulin use (HCC)   [facility] [place], [place], NP   4 months ago Diabetes 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, unspecified whether long term insulin use (HCC)   [place] [facility] Delles, Gentry Fitz A, RPH-CPP   10 months ago Diabetes 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, unspecified whether long term insulin use [facility])   [place] [facility] [place], Salvadore Oxford, NP   10 months ago Diabetes 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, unspecified whether long term insulin use (HCC)  [place] [facility] Delles, Gentry Fitz A, RPH-CPP   10 months ago Diabetes 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, unspecified whether long term insulin use [facility])   [place] [facility] [facility] Delles, Jackelyn Poling, RPH-CPP       Future Appointments             In 2 months Baity, Salvadore Oxford, NP [place] [facility], [facility]

## 2023-04-18 ENCOUNTER — Ambulatory Visit (INDEPENDENT_AMBULATORY_CARE_PROVIDER_SITE_OTHER): Payer: Medicare Other

## 2023-04-18 DIAGNOSIS — Z780 Asymptomatic menopausal state: Secondary | ICD-10-CM | POA: Diagnosis not present

## 2023-04-18 DIAGNOSIS — Z0000 Encounter for general adult medical examination without abnormal findings: Secondary | ICD-10-CM

## 2023-04-18 NOTE — Progress Notes (Signed)
Subjective:   Melissa Cabrera is a 67 y.o. female who presents for Medicare Annual (Subsequent) preventive examination.  Visit Complete: Virtual I connected with  Tyler Aas on 04/18/23 by a audio enabled telemedicine application and verified that I am speaking with the correct person using two identifiers.  Patient Location: Home  Provider Location: Office/Clinic  I discussed the limitations of evaluation and management by telemedicine. The patient expressed understanding and agreed to proceed.  Vital Signs: Because this visit was a virtual/telehealth visit, some criteria may be missing or patient reported. Any vitals not documented were not able to be obtained and vitals that have been documented are patient reported.  Cardiac Risk Factors include: advanced age (>20men, >61 women);diabetes mellitus;dyslipidemia;hypertension;sedentary lifestyle;obesity (BMI >30kg/m2)     Objective:    Today's Vitals   04/18/23 1014  PainSc: 5    There is no height or weight on file to calculate BMI.     04/18/2023   10:19 AM 04/12/2022   11:10 AM 09/03/2021   10:07 AM 05/15/2021    9:00 AM 04/30/2019   11:21 AM 12/08/2016    4:10 PM 12/04/2016    6:16 AM  Advanced Directives  Does Patient Have a Medical Advance Directive? No No No No No No No  Would patient like information on creating a medical advance directive? No - Patient declined No - Patient declined   No - Patient declined  No - Patient declined    Current Medications (verified) Outpatient Encounter Medications as of 04/18/2023  Medication Sig   acetaminophen (TYLENOL) 650 MG CR tablet Take 1,300 mg by mouth daily.   busPIRone (BUSPAR) 5 MG tablet TAKE 1 TABLET EVERY DAY AS NEEDED   cetirizine (ZYRTEC) 10 MG tablet Take 10 mg by mouth at bedtime.    Cholecalciferol 25 MCG (1000 UT) tablet Take 1,000 Units by mouth daily.   diphenhydrAMINE (BENADRYL) 25 mg capsule Take 50 mg by mouth at bedtime.    doxycycline (ADOXA) 100 MG tablet  Take 50 mg by mouth daily.   ezetimibe (ZETIA) 10 MG tablet TAKE 1 TABLET EVERY DAY   fluticasone (FLONASE) 50 MCG/ACT nasal spray Place 2 sprays into both nostrils daily.   folic acid (FOLVITE) 1 MG tablet Take 1 mg by mouth daily.   furosemide (LASIX) 40 MG tablet TAKE 1 TABLET EVERY DAY   gabapentin (NEURONTIN) 300 MG capsule TAKE 2 CAPSULES THREE TIMES DAILY   glucose blood (CONTOUR NEXT TEST) test strip USE TO CHECK GLUCOSE ONCE DAILY  DX: E11.9   Golimumab (SIMPONI ARIA IV) Inject into the vein. Reports receives via infusion from Rheumatology clinic   losartan (COZAAR) 50 MG tablet TAKE 1 TABLET EVERY DAY   metFORMIN (GLUCOPHAGE) 500 MG tablet TAKE 2 TABLETS EVERY MORNING AND TAKE 1 TABLET EVERY EVENING   methotrexate (RHEUMATREX) 2.5 MG tablet Take 25 mg by mouth every Friday. Caution:Chemotherapy. Protect from light.   Microlet Lancets MISC Use to check Blood sugar Once a day.  EX: E11.9   Omega-3 Fatty Acids (FISH OIL PO) Take 1 capsule by mouth in the morning. 1040 mg of fish oil in each capsule   omeprazole (PRILOSEC) 40 MG capsule Take 1 capsule by mouth once daily   predniSONE (DELTASONE) 5 MG tablet Take 5 mg by mouth daily.   RESTASIS 0.05 % ophthalmic emulsion Place 1 drop into both eyes 2 (two) times daily.   rivaroxaban (XARELTO) 10 MG TABS tablet TAKE 1 TABLET EVERY DAY   rosuvastatin (CRESTOR)  10 MG tablet TAKE 1 TABLET EVERY DAY   RYBELSUS 7 MG TABS TAKE 1 TABLET EVERY DAY   vitamin C (ASCORBIC ACID) 500 MG tablet Take 1,000 mg by mouth daily.    Zinc 25 MG TABS Take by mouth.   No facility-administered encounter medications on file as of 04/18/2023.    Allergies (verified) Patient has no known allergies.   History: Past Medical History:  Diagnosis Date   Allergy    Arthritis    Cancer (HCC)    endometrial   Chicken pox    Diabetes mellitus without complication (HCC)    GERD (gastroesophageal reflux disease)    Heart murmur    DUE TO RHEUMATIC FEVER PER PT    Hyperlipidemia    Hypertension    Psoriasis    Rheumatic fever    Rosacea    Past Surgical History:  Procedure Laterality Date   ABDOMINAL HYSTERECTOMY     ANTERIOR AND POSTERIOR REPAIR N/A 12/04/2016   Procedure: ANTERIOR (CYSTOCELE) AND POSTERIOR REPAIR (RECTOCELE), TRANSOBTURATOR SLING PLACEMENT(ARIS);  Surgeon: Linzie Collin, MD;  Location: [facility] ORS;  Service: Gynecology;  Laterality: N/A;   COLONOSCOPY WITH PROPOFOL N/A 05/15/2021   Procedure: COLONOSCOPY WITH PROPOFOL;  Surgeon: Midge Minium, MD;  Location: [facility] ENDOSCOPY;  Service: Endoscopy;  Laterality: N/A;   DILATION AND CURETTAGE OF UTERUS N/A 09/16/2016   Procedure: DILATATION AND CURETTAGE;  Surgeon: Linzie Collin, MD;  Location: [facility] ORS;  Service: Gynecology;  Laterality: N/A;   LAPAROSCOPIC HYSTERECTOMY Bilateral 12/04/2016   Procedure: HYSTERECTOMY TOTAL LAPAROSCOPIC WITH BILATERAL SALPINGO OOPHERECTOMY;  Surgeon: Linzie Collin, MD;  Location: [facility] ORS;  Service: Gynecology;  Laterality: Bilateral;   MOUTH SURGERY     Family History  Problem Relation Age of Onset   Cancer Mother        skin   Dementia Mother    Cancer Sister        skin and liver   Stroke Maternal Grandmother    Skin cancer Brother    Diabetes Neg Hx    Heart disease Neg Hx    Breast cancer Neg Hx    Social History   Socioeconomic History   Marital status: Married    Spouse name: Not on file   Number of children: Not on file   Years of education: Not on file   Highest education level: Not on file  Occupational History   Not on file  Tobacco Use   Smoking status: Former    Current packs/day: 0.00    Average packs/day: 4.0 packs/day for 16.0 years (64.0 ttl pk-yrs)    Types: Cigarettes    Start date: 09/12/1979    Quit date: 09/12/1995    Years since quitting: 27.6   Smokeless tobacco: Never   Tobacco comments:    quit in 1997  Vaping Use   Vaping status: Never Used  Substance and Sexual Activity   Alcohol use: No     Alcohol/week: 0.0 standard drinks of alcohol   Drug use: No   Sexual activity: Yes    Birth control/protection: None  Other Topics Concern   Not on file  Social History Narrative   Not on file   Social Determinants of Health   Financial Resource Strain: Low Risk  (04/18/2023)   Overall Financial Resource Strain (CARDIA)    Difficulty of Paying Living Expenses: Not hard at all  Food Insecurity: No Food Insecurity (04/18/2023)   Hunger Vital Sign    Worried About Running  Out of Food in the Last Year: Never true    Ran Out of Food in the Last Year: Never true  Transportation Needs: No Transportation Needs (04/18/2023)   PRAPARE - Administrator, Civil Service (Medical): No    Lack of Transportation (Non-Medical): No  Physical Activity: Inactive (04/18/2023)   Exercise Vital Sign    Days of Exercise per Week: 0 days    Minutes of Exercise per Session: 0 min  Stress: No Stress Concern Present (04/18/2023)   [employer] of Occupational Health - Occupational Stress Questionnaire    Feeling of Stress : Not at all  Social Connections: Moderately Isolated (04/18/2023)   Social Connection and Isolation Panel [NHANES]    Frequency of Communication with Friends and Family: Never    Frequency of Social Gatherings with Friends and Family: Never    Attends Religious Services: More than 4 times per year    Active Member of [employer] or Organizations: No    Attends Engineer, structural: Never    Marital Status: Married    Tobacco Counseling Counseling given: Not Answered Tobacco comments: quit in 1997   Clinical Intake:  Pre-visit preparation completed: Yes  Pain : 0-10 Pain Score: 5  Pain Type: Chronic pain Pain Location: Knee Pain Orientation: Right, Left Pain Descriptors / Indicators: Aching, Penetrating Pain Onset: More than a month ago Pain Frequency: Constant Pain Relieving Factors: SITTING  Pain Relieving Factors: SITTING  Nutritional Status: BMI > 30   Obese Nutritional Risks: None Diabetes: Yes CBG done?: No Did pt. bring in CBG monitor from home?: No  How often do you need to have someone help you when you read instructions, pamphlets, or other written materials from your doctor or pharmacy?: 1 - Never  Interpreter Needed?: No  Information entered by :: Kennedy Bucker, LPN   Activities of Daily Living    04/18/2023   10:20 AM 04/15/2023    3:27 PM  In your present state of health, do you have any difficulty performing the following activities:  Hearing? 0 0  Vision? 0 0  Difficulty concentrating or making decisions? 0 1  Walking or climbing stairs? 1 1  Comment KNEE PAIN   Dressing or bathing? 0 1  Doing errands, shopping? 0 1  Preparing Food and eating ? N N  Using the Toilet? N N  In the past six months, have you accidently leaked urine? N Y  Do you have problems with loss of bowel control? N Y  Managing your Medications? N N  Managing your Finances? N N  Housekeeping or managing your Housekeeping? N Y    Patient Care Team: Lorre Munroe, NP as PCP - General (Internal Medicine) Benita Gutter, RN as Registered Nurse Ronney Asters, Jackelyn Poling, RPH-CPP as Pharmacist [facility], [facility]  Indicate any recent Medical Services you may have received from other than [facility] providers in the past year (date may be approximate).     Assessment:   This is a routine wellness examination for Homer C Jones.  Hearing/Vision screen Hearing Screening - Comments:: NO AIDS Vision Screening - Comments:: WEARS GLASSES- DR.WOODARD   Goals Addressed             This Visit's Progress    DIET - INCREASE WATER INTAKE         Depression Screen    04/18/2023   10:18 AM 12/16/2022   10:06 AM 06/07/2022    9:08 AM 04/12/2022   11:09 AM 03/07/2022  8:36 AM 12/04/2021    9:27 AM 10/10/2021    9:59 AM  PHQ 2/9 Scores  PHQ - 2 Score 0 1 1 1 1 2 2   PHQ- 9 Score 0  3 2 6 4 6     Fall Risk    04/18/2023   10:20 AM 04/15/2023    3:27  PM 12/16/2022   10:07 AM 06/07/2022    9:09 AM 04/12/2022   11:11 AM  Fall Risk   Falls in the past year? 0 0 0 0 0  Number falls in past yr: 0 0   0  Injury with Fall? 0 0 0 0 0  Risk for fall due to : No Fall Risks  No Fall Risks No Fall Risks No Fall Risks  Follow up Falls prevention discussed;Falls evaluation completed    Falls prevention discussed;Falls evaluation completed    MEDICARE RISK AT HOME: Medicare Risk at Home Any stairs in or around the home?: No If so, are there any without handrails?: No Home free of loose throw rugs in walkways, pet beds, electrical cords, etc?: Yes Adequate lighting in your home to reduce risk of falls?: Yes Life alert?: No Use of a cane, walker or w/c?: No Grab bars in the bathroom?: No Shower chair or bench in shower?: No Elevated toilet seat or a handicapped toilet?: No  TIMED UP AND GO:  Was the test performed?  No    Cognitive Function:        04/18/2023   10:21 AM 04/12/2022   11:12 AM  6CIT Screen  What Year? 0 points 0 points  What month? 0 points 0 points  What time? 0 points 0 points  Count back from 20 0 points 0 points  Months in reverse 0 points 0 points  Repeat phrase 0 points 0 points  Total Score 0 points 0 points    Immunizations  There is no immunization history on file for this patient.  TDAP status: Due, Education has been provided regarding the importance of this vaccine. Advised may receive this vaccine at local pharmacy or Health Dept. Aware to provide a copy of the vaccination record if obtained from local pharmacy or Health Dept. Verbalized acceptance and understanding.  Flu Vaccine status: Declined, Education has been provided regarding the importance of this vaccine but patient still declined. Advised may receive this vaccine at local pharmacy or Health Dept. Aware to provide a copy of the vaccination record if obtained from local pharmacy or Health Dept. Verbalized acceptance and  understanding.  Pneumococcal vaccine status: Declined,  Education has been provided regarding the importance of this vaccine but patient still declined. Advised may receive this vaccine at local pharmacy or Health Dept. Aware to provide a copy of the vaccination record if obtained from local pharmacy or Health Dept. Verbalized acceptance and understanding.   Covid-19 vaccine status: Declined, Education has been provided regarding the importance of this vaccine but patient still declined. Advised may receive this vaccine at local pharmacy or Health Dept.or vaccine clinic. Aware to provide a copy of the vaccination record if obtained from local pharmacy or Health Dept. Verbalized acceptance and understanding.  Qualifies for Shingles Vaccine? Yes   Zostavax completed No   Shingrix Completed?: No.    Education has been provided regarding the importance of this vaccine. Patient has been advised to call insurance company to determine out of pocket expense if they have not yet received this vaccine. Advised may also receive vaccine at local pharmacy or  Health Dept. Verbalized acceptance and understanding.  Screening Tests Health Maintenance  Topic Date Due   DTaP/Tdap/Td (1 - Tdap) Never done   Zoster Vaccines- Shingrix (1 of 2) Never done   COVID-19 Vaccine (1) 06/18/2023 (Originally 04/02/1961)   INFLUENZA VACCINE  08/11/2023 (Originally 12/12/2022)   Pneumonia Vaccine 39+ Years old (1 of 1 - PCV) 12/16/2023 (Originally 04/02/2021)   FOOT EXAM  06/08/2023   HEMOGLOBIN A1C  06/18/2023   OPHTHALMOLOGY EXAM  11/20/2023   Diabetic kidney evaluation - eGFR measurement  12/16/2023   Diabetic kidney evaluation - Urine ACR  12/16/2023   MAMMOGRAM  12/18/2023   Medicare Annual Wellness (AWV)  04/17/2024   Colonoscopy  05/15/2028   DEXA SCAN  Completed   Hepatitis C Screening  Completed   HPV VACCINES  Aged Out    Health Maintenance  Health Maintenance Due  Topic Date Due   DTaP/Tdap/Td (1 - Tdap)  Never done   Zoster Vaccines- Shingrix (1 of 2) Never done    Colorectal cancer screening: Type of screening: Colonoscopy. Completed 05/15/21. Repeat every 7 years  Mammogram status: Completed 12/18/22. Repeat every year  Bone Density status: Completed 05/19/18. Results reflect: Bone density results: NORMAL. Repeat every 5 years.  Lung Cancer Screening: (Low Dose CT Chest recommended if Age 64-80 years, 20 pack-year currently smoking OR have quit w/in 15years.) does not qualify.    Additional Screening:  Hepatitis C Screening: does qualify; Completed 10/29/16  Vision Screening: Recommended annual ophthalmology exams for early detection of glaucoma and other disorders of the eye. Is the patient up to date with their annual eye exam?  Yes  Who is the provider or what is the name of the office in which the patient attends annual eye exams? DR.WOODARD If pt is not established with a provider, would they like to be referred to a provider to establish care? No .   Dental Screening: Recommended annual dental exams for proper oral hygiene  Diabetic Foot Exam: Diabetic Foot Exam: Completed 06/07/22  Community Resource Referral / Chronic Care Management: CRR required this visit?  No   CCM required this visit?  No     Plan:     I have personally reviewed and noted the following in the patient's chart:   Medical and social history Use of alcohol, tobacco or illicit drugs  Current medications and supplements including opioid prescriptions. Patient is not currently taking opioid prescriptions. Functional ability and status Nutritional status Physical activity Advanced directives List of other physicians Hospitalizations, surgeries, and ER visits in previous 12 months Vitals Screenings to include cognitive, depression, and falls Referrals and appointments  In addition, I have reviewed and discussed with patient certain preventive protocols, quality metrics, and best practice  recommendations. A written personalized care plan for preventive services as well as general preventive health recommendations were provided to patient.     Hal Hope, LPN   [id number]   After Visit Summary: (MyChart) Due to this being a telephonic visit, the after visit summary with patients personalized plan was offered to patient via MyChart   Nurse Notes: BDS REFERRAL SENT

## 2023-04-18 NOTE — Patient Instructions (Addendum)
Melissa Cabrera , Thank you for taking time to come for your Medicare Wellness Visit. I appreciate your ongoing commitment to your health goals. Please review the following plan we discussed and let me know if I can assist you in the future.   Referrals/Orders/Follow-Ups/Clinician Recommendations: BONE DENSITY SCAN REFERRAL SENT  You have an order for:  []   2D Mammogram  []   3D Mammogram  [x]   Bone Density     Please call for appointment:  [facility]  [address] [place] [address] [phone] [facility] [address] [place], [place] [address] [phone] [facility] [address] Geanie Logan [place], [place] [address] [phone]   Make sure to wear two-piece clothing.  No lotions, powders, or deodorants the day of the appointment. Make sure to bring picture ID and insurance card.  Bring list of medications you are currently taking including any supplements.   Schedule your [place] screening mammogram through MyChart!   Log into your MyChart account.  Go to 'Visit' (or 'Appointments' if on mobile App) --> Schedule an Appointment  Under 'Select a Reason for Visit' choose the Mammogram Screening option.  Complete the pre-visit questions and select the time and place that best fits your schedule.   This is a list of the screening recommended for you and due dates:  Health Maintenance  Topic Date Due   DTaP/Tdap/Td vaccine (1 - Tdap) Never done   Zoster (Shingles) Vaccine (1 of 2) Never done   COVID-19 Vaccine (1) 06/18/2023*   Flu Shot  08/11/2023*   Pneumonia Vaccine (1 of 1 - PCV) 12/16/2023*   Complete foot exam   06/08/2023   Hemoglobin A1C  06/18/2023   Eye exam for diabetics  11/20/2023   Yearly kidney function blood test for diabetes  12/16/2023   Yearly kidney health urinalysis for diabetes  12/16/2023   Mammogram  12/18/2023    Medicare Annual Wellness Visit  04/17/2024   Colon Cancer Screening  05/15/2028   DEXA scan (bone density measurement)  Completed   Hepatitis C Screening  Completed   HPV Vaccine  Aged Out  *Topic was postponed. The date shown is not the original due date.    Advanced directives: (ACP Link)Information on Advanced Care Planning can be found at [facility] Advance Health Care Directives Advance Health Care Directives ([email or web address]   Next Medicare Annual Wellness Visit scheduled for next year: Yes   04/23/24 @ 10:50 AM BY VIDEO

## 2023-04-29 ENCOUNTER — Encounter: Payer: Self-pay | Admitting: Internal Medicine

## 2023-05-01 NOTE — Telephone Encounter (Signed)
Dr. Kirtland Bouchard,  The young man in the message below is your patient.  The grandmother, grandfather and his brother are my patients.  They would like him to switch to me just because the rest of the family sees me.  I told him I would reach out to ask if you are okay with the transfer although we typically do not transfer between providers in the office.  Just let me know if you are agreeable to this transfer or not and I can reach out to the patient/grandmother and let them know.

## 2023-05-14 ENCOUNTER — Encounter: Payer: Self-pay | Admitting: Internal Medicine

## 2023-06-18 ENCOUNTER — Ambulatory Visit (INDEPENDENT_AMBULATORY_CARE_PROVIDER_SITE_OTHER): Payer: Medicare Other | Admitting: Internal Medicine

## 2023-06-18 ENCOUNTER — Encounter: Payer: Self-pay | Admitting: Internal Medicine

## 2023-06-18 VITALS — BP 118/72 | Ht 60.0 in | Wt 251.8 lb

## 2023-06-18 DIAGNOSIS — E66813 Obesity, class 3: Secondary | ICD-10-CM

## 2023-06-18 DIAGNOSIS — Z86718 Personal history of other venous thrombosis and embolism: Secondary | ICD-10-CM

## 2023-06-18 DIAGNOSIS — E119 Type 2 diabetes mellitus without complications: Secondary | ICD-10-CM | POA: Diagnosis not present

## 2023-06-18 DIAGNOSIS — Z8542 Personal history of malignant neoplasm of other parts of uterus: Secondary | ICD-10-CM

## 2023-06-18 DIAGNOSIS — I5032 Chronic diastolic (congestive) heart failure: Secondary | ICD-10-CM

## 2023-06-18 DIAGNOSIS — D692 Other nonthrombocytopenic purpura: Secondary | ICD-10-CM

## 2023-06-18 DIAGNOSIS — I10 Essential (primary) hypertension: Secondary | ICD-10-CM | POA: Diagnosis not present

## 2023-06-18 DIAGNOSIS — M48061 Spinal stenosis, lumbar region without neurogenic claudication: Secondary | ICD-10-CM | POA: Diagnosis not present

## 2023-06-18 DIAGNOSIS — E1143 Type 2 diabetes mellitus with diabetic autonomic (poly)neuropathy: Secondary | ICD-10-CM

## 2023-06-18 DIAGNOSIS — M150 Primary generalized (osteo)arthritis: Secondary | ICD-10-CM

## 2023-06-18 DIAGNOSIS — L719 Rosacea, unspecified: Secondary | ICD-10-CM

## 2023-06-18 DIAGNOSIS — F411 Generalized anxiety disorder: Secondary | ICD-10-CM

## 2023-06-18 DIAGNOSIS — J3489 Other specified disorders of nose and nasal sinuses: Secondary | ICD-10-CM

## 2023-06-18 DIAGNOSIS — E785 Hyperlipidemia, unspecified: Secondary | ICD-10-CM

## 2023-06-18 DIAGNOSIS — K219 Gastro-esophageal reflux disease without esophagitis: Secondary | ICD-10-CM

## 2023-06-18 DIAGNOSIS — L4050 Arthropathic psoriasis, unspecified: Secondary | ICD-10-CM

## 2023-06-18 MED ORDER — OMEPRAZOLE 40 MG PO CPDR: 40.0000 mg | DELAYED_RELEASE_CAPSULE | Freq: Every day | ORAL | 1 refills | Status: DC

## 2023-06-18 MED ORDER — MUPIROCIN 2 % EX OINT: 1.0000 | TOPICAL_OINTMENT | Freq: Two times a day (BID) | CUTANEOUS | 0 refills | Status: DC

## 2023-06-18 MED ORDER — RIVAROXABAN 10 MG PO TABS: 10.0000 mg | ORAL_TABLET | Freq: Every day | ORAL | 1 refills | Status: DC

## 2023-06-18 MED ORDER — BUSPIRONE HCL 5 MG PO TABS: ORAL_TABLET | ORAL | 1 refills | Status: DC

## 2023-06-18 MED ORDER — GABAPENTIN 300 MG PO CAPS: ORAL_CAPSULE | ORAL | 1 refills | Status: DC

## 2023-06-18 MED ORDER — METFORMIN HCL 500 MG PO TABS: ORAL_TABLET | ORAL | 1 refills | Status: DC

## 2023-06-18 MED ORDER — OMEPRAZOLE 40 MG PO CPDR: 40.0000 mg | DELAYED_RELEASE_CAPSULE | Freq: Every day | ORAL | 0 refills | Status: DC

## 2023-06-18 MED ORDER — ROSUVASTATIN CALCIUM 10 MG PO TABS: 10.0000 mg | ORAL_TABLET | Freq: Every day | ORAL | 1 refills | Status: DC

## 2023-06-18 MED ORDER — EZETIMIBE 10 MG PO TABS: 10.0000 mg | ORAL_TABLET | Freq: Every day | ORAL | 1 refills | Status: DC

## 2023-06-18 MED ORDER — OSELTAMIVIR PHOSPHATE 75 MG PO CAPS: 75.0000 mg | ORAL_CAPSULE | Freq: Two times a day (BID) | ORAL | 0 refills | Status: DC

## 2023-06-18 MED ORDER — LOSARTAN POTASSIUM 50 MG PO TABS: 50.0000 mg | ORAL_TABLET | Freq: Every day | ORAL | 1 refills | Status: DC

## 2023-06-18 NOTE — Assessment & Plan Note (Signed)
Continue doxycycline per dermatology. °

## 2023-06-18 NOTE — Assessment & Plan Note (Signed)
Continue gabapentin as prescribed

## 2023-06-18 NOTE — Assessment & Plan Note (Signed)
Avoid foods that trigger reflux Encourage weight loss as this can help reduce reflux symptoms Continue omeprazole 

## 2023-06-18 NOTE — Assessment & Plan Note (Signed)
 Encouraged diet and exercise for weight loss ?

## 2023-06-18 NOTE — Assessment & Plan Note (Signed)
Controlled on losartan and furosemide Reinforced DASH diet and exercise for weight loss C-Met today

## 2023-06-18 NOTE — Assessment & Plan Note (Signed)
 A1c today Urine microalbumin has been checked within the last year Consider changing metformin  to XR due to GI side effects Continue Rybelsus , consider increasing to 14 mg daily pending labs Continue gabapentin  for neuropathic pain Encourage low-carb diet and exercise for weight loss Encouraged routine eye exam Encouraged routine foot exam She declines immunizations today

## 2023-06-18 NOTE — Assessment & Plan Note (Signed)
Will need lifelong anticoagualtion Continue xarelto

## 2023-06-18 NOTE — Assessment & Plan Note (Signed)
 Encourage weight loss as this can help reduce back pain Encouraged regular stretching and core strengthening Referral to neurosurgery for further evaluation and treatment given worsening lower extremity pain

## 2023-06-18 NOTE — Assessment & Plan Note (Signed)
 CBC today.

## 2023-06-18 NOTE — Assessment & Plan Note (Signed)
Continue with gabapentin, methotrexate, tylenol, simponi and prednisone She will continue to follow with rheumatology

## 2023-06-18 NOTE — Patient Instructions (Signed)
Spinal Stenosis  Spinal stenosis happens when the spinal canal gets smaller. The spinal canal is the space between the bones of your spine (vertebrae). As the canal gets smaller, the nerves that pass that part of the spine are pressed. This causes pain, weakness, or loss of feeling (numbness) in your arms or legs. Spinal stenosis can affect your neck, upper back, or lower back. What are the causes? This condition is caused by parts of bone that push into your spinal canal. This problem may be present at birth. If it occurs after birth, the cause may be: Breakdown of bones of your spine. This normally starts between 16 and 80 years of age. Injury to your spine. Any surgeries you have had to your spine. Tumors in your spine. A buildup of calcium in your spine. What increases the risk? You are more likely to develop this condition if: You are older than age 88. You were born with a problem in your spine, such as a curved spine (scoliosis). You have arthritis. This is disease of your joints. What are the signs or symptoms? Common symptoms of this condition include: Pain in your neck or back. The pain may be worse when you stand or walk. Problems with your legs or arms. A leg or arm may lose feeling, tingle, or turn hot or cold. This can happen in one arm or leg, or both. Pain that goes from your butt down to your lower leg (sciatica). This can happen in one or both legs. Falling a lot. Foot drop. This is when you have trouble lifting the front part of your foot and it drags on the ground when you walk. Severe symptoms of this condition include: Problems pooping (having a bowel movement) or peeing (urinating). Trouble having sex. Loss of feeling in your leg. Being unable to walk. Symptoms may come on slowly and get worse over time. Sometimes there are no symptoms. How is this treated? To treat pain and manage symptoms, you may be asked to: Practice sitting and standing up straight. This is  good posture. Do exercises. Lose weight, if needed. Take medicines or get shots. Wear a corset or a brace. This supports your back. In some cases, you may need to have surgery. Follow these instructions at home: Managing pain, stiffness, and swelling  Stand and sit up straight. If you have a brace or a corset, wear it as told by your doctor. Keep a healthy weight. Talk with your doctor if you need help losing weight. If told, put heat on the affected area. Do this as often as told by your doctor. Use the heat source that your doctor recommends, such as a moist heat pack or a heating pad. Place a towel between your skin and the heat source. Leave the heat on for 20-30 minutes. If your skin turns bright red, take off the heat right away to prevent burns. The risk of burns is higher if you cannot feel pain, heat, or cold. Activity Do exercises as told by your doctor. Do not do anything that causes pain. Ask your doctor what activities are safe for you. You may have to avoid lifting. Ask your doctor how much you can lift. Return to your normal activities when your doctor says that it is safe. General instructions Take over-the-counter and prescription medicines only as told by your doctor. Do not smoke or use any products that contain nicotine or tobacco. If you need help quitting, ask your doctor. Eat a healthy diet. Eat  a lot of: Fruits. Vegetables. Whole grains. Low-fat (lean) protein. Where to find more information [employer] of Arthritis and Musculoskeletal and Skin Diseases: www.niams.http://www.myers.net/ Contact a doctor if: Your symptoms do not get better. Your symptoms get worse. You have a fever. Get help right away if: You have new pain or very bad pain in your neck or upper back. You have very bad pain, and medicine does not help. You have a very bad headache. You are dizzy. You do not see well. You vomit or feel like you may vomit. You have these things in your back or  legs: New or worse loss of feeling. New or worse tingling. You cannot control when you poop or pee. Your arm or leg: Hurts or swells. Turns red. Feels warm. These symptoms may be an emergency. Get help right away. Call 911. Do not wait to see if the symptoms will go away. Do not drive yourself to the hospital. Summary Spinal stenosis happens when the space between the bones of the spine gets smaller. This condition may be present at birth. Spinal stenosis can cause pain in the neck, back, legs, or butt. Treatment for this condition focuses on lessening your pain and other symptoms. This information is not intended to replace advice given to you by your health care provider. Make sure you discuss any questions you have with your health care provider. Document Revised: 08/06/2021 Document Reviewed: 07/24/2021 Elsevier Patient Education  2024 [employer].

## 2023-06-18 NOTE — Assessment & Plan Note (Signed)
Encourage weight loss as this can to reduce joint pain Continue Tylenol, prednisone and gabapentin

## 2023-06-18 NOTE — Assessment & Plan Note (Signed)
 Worsening lower extremity edema Reinforced DASH diet, advised her to monitor daily weights Continue losartan  and furosemide  C-Met today, if kidney function normal will increase furosemide  to 2 times daily 2D echo ordered

## 2023-06-18 NOTE — Assessment & Plan Note (Signed)
In remission s/p hysterectomy No longer following with oncology

## 2023-06-18 NOTE — Assessment & Plan Note (Signed)
C-Met and lipid profile today Encouraged her to consume a low-fat diet Continue rosuvastatin, ezetimibe and fish oil

## 2023-06-18 NOTE — Assessment & Plan Note (Addendum)
Continue buspirone Support offered 

## 2023-06-18 NOTE — Progress Notes (Signed)
 Subjective:    Patient ID: Melissa Melissa, female    DOB: [birth year], 68 y.o.   MRN: [id number]  HPI  Patient presents to clinic today for 68-month follow-up of chronic conditions.  CHF, diastolic: She reports some lower extremity edema and shortness of breath but denies chronic cough.  She is taking losartan  and furosemide  as prescribed.  Echo from 07/2017 reviewed.  She does not follow with cardiology.  HTN: Her BP today is 118/72.  She is taking losartan  and furosemide  as prescribed.  ECG from 11/2018 reviewed.  HLD: Her last LDL was 59, triglyceride 162, 05/2022.  She denies myalgias on rosuvastatin , ezetimibe  and Melissa Melissa.  She does not consume a low-fat diet.  DM2 with peripheral neuropathy: Her last A1c was 6.3%, 12/2022.  She is taking metformin , rybelsus  and gabapentin  as prescribed.  She does not check her sugars.  She checks her feet routinely.  Her last eye exam was 11/2022.  Flu never.  Pneumovax never.  Prevnar never.  COVID never.  Anxiety: Chronic, managed on buspirone  as needed.  She is not currently seeing a therapist.  She denies depression, SI/HI.  Lumbar spinal stenosis/OA/psoriatic arthritis: Mainly in her low back, knees and feet.  X-ray lumbar spine and MRI lumbar spine from 2019 reviewed.  She does feel like her pain is significantly worse.  Managed with gabapentin , methotrexate, tylenol , simponi, and prednisone . She follows with rheumatology but not neurosurgery.  History of DVT: Managed with lifelong xarelto . She no longer follows with hematology.  History of endometrial cancer: In remission s/p hysterectomy. She no longer follows with oncology.  GERD: Triggered by certain medications.  She denies breakthrough on omeprazole .  There is no upper GI on file.  Rosacea: Managed with doxycycline. She follows with dermatology.  She will reports a nasal sore.  This has been ongoing.  She has not tried anything OTC for this.  Review of Systems     Past Medical History:   Diagnosis Date   Allergy    Arthritis    Cancer (HCC)    endometrial   Chicken pox    Diabetes mellitus without complication (HCC)    GERD (gastroesophageal reflux disease)    Heart murmur    DUE TO RHEUMATIC FEVER PER PT   Hyperlipidemia    Hypertension    Psoriasis    Rheumatic fever    Rosacea     Current Outpatient Medications  Medication Sig Dispense Refill   acetaminophen  (TYLENOL ) 650 MG CR tablet Take 1,300 mg by mouth daily.     busPIRone  (BUSPAR ) 5 MG tablet TAKE 1 TABLET EVERY DAY AS NEEDED 90 tablet 2   cetirizine (ZYRTEC) 10 MG tablet Take 10 mg by mouth at bedtime.      Cholecalciferol 25 MCG (1000 UT) tablet Take 1,000 Units by mouth daily.     diphenhydrAMINE (BENADRYL) 25 mg capsule Take 50 mg by mouth at bedtime.      doxycycline (ADOXA) 100 MG tablet Take 50 mg by mouth daily.     ezetimibe  (ZETIA ) 10 MG tablet TAKE 1 TABLET EVERY DAY 90 tablet 3   fluticasone  (FLONASE ) 50 MCG/ACT nasal spray Place 2 sprays into both nostrils daily. 16 g 6   folic acid (FOLVITE) 1 MG tablet Take 1 mg by mouth daily.     furosemide  (LASIX ) 40 MG tablet TAKE 1 TABLET EVERY DAY 90 tablet 1   gabapentin  (NEURONTIN ) 300 MG capsule TAKE 2 CAPSULES THREE TIMES DAILY 540 capsule 0  glucose blood (CONTOUR NEXT TEST) test strip USE TO CHECK GLUCOSE ONCE DAILY  DX: E11.9 100 each 1   Golimumab (SIMPONI ARIA IV) Inject into the vein. Reports receives via infusion from Rheumatology clinic     losartan  (COZAAR ) 50 MG tablet TAKE 1 TABLET EVERY DAY 90 tablet 1   metFORMIN  (GLUCOPHAGE ) 500 MG tablet TAKE 2 TABLETS EVERY MORNING AND TAKE 1 TABLET EVERY EVENING 270 tablet 1   methotrexate (RHEUMATREX) 2.5 MG tablet Take 25 mg by mouth every Friday. Caution:Chemotherapy. Protect from light.     Microlet Lancets MISC Use to check Blood sugar Once a day.  EX: E11.9 100 each 1   Omega-3 Fatty Acids (Melissa Melissa PO) Take 1 capsule by mouth in the morning. 1040 mg of Melissa Melissa in each capsule      omeprazole  (PRILOSEC) 40 MG capsule Take 1 capsule by mouth once daily 90 capsule 0   predniSONE  (DELTASONE ) 5 MG tablet Take 5 mg by mouth daily.     RESTASIS 0.05 % ophthalmic emulsion Place 1 drop into both eyes 2 (two) times daily.     rivaroxaban  (XARELTO ) 10 MG TABS tablet TAKE 1 TABLET EVERY DAY 90 tablet 0   rosuvastatin  (CRESTOR ) 10 MG tablet TAKE 1 TABLET EVERY DAY 90 tablet 3   RYBELSUS  7 MG TABS TAKE 1 TABLET EVERY DAY 90 tablet 3   vitamin C (ASCORBIC ACID) 500 MG tablet Take 1,000 mg by mouth daily.      Zinc 25 MG TABS Take by mouth.     No current facility-administered medications for this visit.    No Known Allergies  Family History  Problem Relation Age of Onset   Cancer Mother        skin   Dementia Mother    Cancer Sister        skin and liver   Stroke Maternal Grandmother    Skin cancer Brother    Diabetes Neg Hx    Heart disease Neg Hx    Breast cancer Neg Hx     Social History   Socioeconomic History   Marital status: Married    Spouse name: Not on file   Number of children: Not on file   Years of education: Not on file   Highest education level: Not on file  Occupational History   Not on file  Tobacco Use   Smoking status: Former    Current packs/day: 0.00    Average packs/day: 4.0 packs/day for 16.0 years (64.0 ttl pk-yrs)    Types: Cigarettes    Start date: 09/12/1979    Quit date: 09/12/1995    Years since quitting: 27.7   Smokeless tobacco: Never   Tobacco comments:    quit in 1997  Vaping Use   Vaping status: Never Used  Substance and Sexual Activity   Alcohol use: No    Alcohol/week: 0.0 standard drinks of alcohol   Drug use: No   Sexual activity: Yes    Birth control/protection: None  Other Topics Concern   Not on file  Social History Narrative   Not on file   Social Drivers of Health   Financial Resource Strain: Low Risk  (04/18/2023)   Overall Financial Resource Strain (CARDIA)    Difficulty of Paying Living Expenses: Not  hard at all  Food Insecurity: No Food Insecurity (04/18/2023)   Hunger Vital Sign    Worried About Running Out of Food in the Last Year: Never true    Ran  Out of Food in the Last Year: Never true  Transportation Needs: No Transportation Needs (04/18/2023)   PRAPARE - Administrator, Civil Service (Medical): No    Lack of Transportation (Non-Medical): No  Physical Activity: Inactive (04/18/2023)   Exercise Vital Sign    Days of Exercise per Week: 0 days    Minutes of Exercise per Session: 0 min  Stress: No Stress Concern Present (04/18/2023)   [employer] of Occupational Health - Occupational Stress Questionnaire    Feeling of Stress : Not at all  Social Connections: Moderately Isolated (04/18/2023)   Social Connection and Isolation Panel [NHANES]    Frequency of Communication with Friends and Family: Never    Frequency of Social Gatherings with Friends and Family: Never    Attends Religious Services: More than 4 times per year    Active Member of [employer] or Organizations: No    Attends Banker Meetings: Never    Marital Status: Married  Catering Manager Violence: Not At Risk (04/18/2023)   Humiliation, Afraid, Rape, and Kick questionnaire    Fear of Current or Ex-Partner: No    Emotionally Abused: No    Physically Abused: No    Sexually Abused: No     Constitutional: Pt reports weight gain. Denies fever, malaise, fatigue, headache.  HEENT: Patient reports nasal sore.  Denies eye pain, eye redness, ear pain, ringing in the ears, wax buildup, runny nose, nasal congestion, bloody nose, or sore throat. Respiratory: Patient reports intermittent shortness of breath.  Denies difficulty breathing, cough or sputum production.   Cardiovascular: Patient reports swelling in legs.  Denies chest pain, chest tightness, palpitations or swelling in the hands.  Gastrointestinal: Pt reports intermittent diarrhea, abdominal cramping. Denies bloating, constipation, or blood in  the stool.  GU: Pt reports mixed incontinence. Denies pain with urination, burning sensation, blood in urine, odor or discharge. Musculoskeletal: Pt reports low back pain, bilateral knee pain, bilateral foot pain, difficulty with gait. Denies decrease in range of motion, muscle pain or joint swelling.  Skin: Denies redness, rashes, lesions or ulcercations.  Neurological: Patient reports neuropathic pain of BLE.  Denies dizziness, difficulty with memory, difficulty with speech or problems with balance and coordination.  Psych: Patient has a history of anxiety.  Denies anxiety, depression, SI/HI.  No other specific complaints in a complete review of systems (except as listed in HPI above).  Objective:   Physical Exam  BP 118/72 (BP Location: Left Arm, Patient Position: Sitting, Cuff Size: Large)   Ht 5' (1.524 m)   Wt 251 lb 12.8 oz (114.2 kg)   BMI 49.18 kg/m    Wt Readings from Last 3 Encounters:  12/16/22 227 lb (103 kg)  06/07/22 230 lb (104.3 kg)  04/12/22 252 lb (114.3 kg)    General: Appears her stated age, obese, in NAD. Skin: Warm, dry and intact.  Senile purpura noted of BUE.  No ulcerations noted. HEENT: Head: normal shape and size; Eyes: sclera white, no icterus, conjunctiva pink, PERRLA and EOMs intact; Nose: Ulceration noted in the left nare. Neck:  Neck supple, trachea midline.  Thyromegaly or thyroid  nodule present. Cardiovascular: Normal rate and rhythm. S1,S2 noted.  No murmur, rubs or gallops noted. No JVD. 1 + BLE edema. No carotid bruits noted. Pulmonary/Chest: Normal effort and positive vesicular breath sounds. No respiratory distress. No wheezes, rales or ronchi noted.  Abdomen: Normal bowel sounds.  Musculoskeletal: She has difficulty getting from a sitting to a standing position.  Gait slow and steady without device. Neurological: Alert and oriented. Coordination normal.  Psychiatric: Mood and affect normal. Behavior is normal. Judgment and thought content  normal.     BMET    Component Value Date/Time   NA 141 12/16/2022 0911   NA 144 06/07/2022 0927   K 4.5 12/16/2022 0911   CL 103 12/16/2022 0911   CO2 30 12/16/2022 0911   GLUCOSE 106 12/16/2022 0911   BUN 19 12/16/2022 0911   BUN 15 06/07/2022 0927   CREATININE 0.83 12/16/2022 0911   CALCIUM  9.2 12/16/2022 0911   GFRNONAA >60 04/30/2019 1203   GFRAA >60 04/30/2019 1203    Lipid Panel     Component Value Date/Time   CHOL 144 06/07/2022 0927   TRIG 162 (H) 06/07/2022 0927   HDL 58 06/07/2022 0927   CHOLHDL 2.5 06/07/2022 0927   CHOLHDL 2.9 04/10/2021 1008   VLDL 39.0 12/21/2019 1149   LDLCALC 59 06/07/2022 0927   LDLCALC 74 04/10/2021 1008    CBC    Component Value Date/Time   WBC 5.8 12/16/2022 0911   RBC 4.03 12/16/2022 0911   HGB 13.1 12/16/2022 0911   HGB 12.7 03/07/2022 0912   HCT 39.2 12/16/2022 0911   HCT 38.0 03/07/2022 0912   PLT 294 12/16/2022 0911   PLT 298 03/07/2022 0912   MCV 97.3 12/16/2022 0911   MCV 92 03/07/2022 0912   MCH 32.5 12/16/2022 0911   MCHC 33.4 12/16/2022 0911   RDW 14.2 12/16/2022 0911   RDW 14.3 03/07/2022 0912   LYMPHSABS 2.1 04/30/2019 1203   MONOABS 0.6 04/30/2019 1203   EOSABS 0.2 04/30/2019 1203   BASOSABS 0.0 04/30/2019 1203    Hgb A1C Lab Results  Component Value Date   HGBA1C 6.3 (A) 12/16/2022           Assessment & Plan:   Nasal sore:  Rx for Bactroban  2% ointment twice daily as needed Consider referral to ENT for further evaluation if symptoms persist or worsen.  RTC in 6 months for follow-up of chronic conditions Angeline Laura, NP

## 2023-06-19 ENCOUNTER — Encounter: Payer: Self-pay | Admitting: Internal Medicine

## 2023-06-19 LAB — CBC
HCT: 40.5 % (ref 35.0–45.0)
Hemoglobin: 13 g/dL (ref 11.7–15.5)
MCH: 31.4 pg (ref 27.0–33.0)
MCHC: 32.1 g/dL (ref 32.0–36.0)
MCV: 97.8 fL (ref 80.0–100.0)
MPV: 9.4 fL (ref 7.5–12.5)
Platelets: 295 10*3/uL (ref 140–400)
RBC: 4.14 10*6/uL (ref 3.80–5.10)
RDW: 14.2 % (ref 11.0–15.0)
WBC: 4.9 10*3/uL (ref 3.8–10.8)

## 2023-06-19 LAB — COMPLETE METABOLIC PANEL WITH GFR
AG Ratio: 2 (calc) (ref 1.0–2.5)
ALT: 21 U/L (ref 6–29)
AST: 22 U/L (ref 10–35)
Albumin: 4 g/dL (ref 3.6–5.1)
Alkaline phosphatase (APISO): 58 U/L (ref 37–153)
BUN: 14 mg/dL (ref 7–25)
CO2: 29 mmol/L (ref 20–32)
Calcium: 9.4 mg/dL (ref 8.6–10.4)
Chloride: 105 mmol/L (ref 98–110)
Creat: 0.87 mg/dL (ref 0.50–1.05)
Globulin: 2 g/dL (ref 1.9–3.7)
Glucose, Bld: 128 mg/dL — ABNORMAL HIGH (ref 65–99)
Potassium: 4.2 mmol/L (ref 3.5–5.3)
Sodium: 142 mmol/L (ref 135–146)
Total Bilirubin: 0.5 mg/dL (ref 0.2–1.2)
Total Protein: 6 g/dL — ABNORMAL LOW (ref 6.1–8.1)
eGFR: 73 mL/min/{1.73_m2} (ref >=60)

## 2023-06-19 LAB — LIPID PANEL
Cholesterol: 120 mg/dL (ref <200)
HDL: 74 mg/dL (ref >=50)
LDL Cholesterol (Calc): 24 mg/dL
Non-HDL Cholesterol (Calc): 46 mg/dL (ref <130)
Total CHOL/HDL Ratio: 1.6 (calc) (ref <5.0)
Triglycerides: 135 mg/dL (ref <150)

## 2023-06-19 LAB — HEMOGLOBIN A1C
Hgb A1c MFr Bld: 6.2 %{Hb} — ABNORMAL HIGH (ref <5.7)
Mean Plasma Glucose: 131 mg/dL
eAG (mmol/L): 7.3 mmol/L

## 2023-06-19 MED ORDER — METFORMIN HCL ER 500 MG PO TB24: 500.0000 mg | ORAL_TABLET | Freq: Two times a day (BID) | ORAL | 1 refills | Status: DC

## 2023-06-19 NOTE — Addendum Note (Signed)
 Addended by: Carollynn Cirri on: 06/19/2023 01:20 PM   Modules accepted: Orders

## 2023-07-01 NOTE — Progress Notes (Unsigned)
Referring Physician:  Lorre Munroe, NP [address] [place],  [place] [address]  Primary Physician:  Lorre Munroe, NP  History of Present Illness: 07/04/2023 Melissa Cabrera has a history of heart failure, HTN, GERD, hyperlipidemia, DM with neuropathy, psoriatic arthritis, history of DVT, history of endometrial CA, obesity.   Lumbar MRI from 2019 showed advanced spinal stenosis at L4-L5.   She has intermittent LBP with bilateral leg pain (entire leg) to her knees x years. No numbness or tingling in her legs. She has weakness in both legs. Her legs feel like "jello." Pain is worse with any increased activity such as house work (cooking, Pharmacologist, vacuuming). Pain is better when sitting for short periods of time.   She has known OA of both knees and needs TKAs.   She notes intermittent right sided neck pain. No arm pain. No numbness or tingling. She has issues with hand dexterity, she drops things. She has balance issues that are getting worse.   She is on XARELTO. She takes prn tylenol, neurontin, and prednisone.   She does not smoke. Quit in 1997.   Bowel/Bladder Dysfunction: she has urinary and bowel urgency x months, no perineal numbness.   Conservative measures: chiropractor, dry needling Physical therapy: has participated in PT 2015-2016 Multimodal medical therapy including regular antiinflammatories: tylenol, gabapentin  Injections: has received 1 epidural steroid injection around 2015-2016  Past Surgery: no spinal surgeries.  Dream Harman has issued with hand dexterity, dropping things, and balance issues.   The symptoms are causing a significant impact on the patient's life.   Review of Systems:  A 10 point review of systems is negative, except for the pertinent positives and negatives detailed in the HPI.  Past Medical History: Past Medical History:  Diagnosis Date   Allergy    Arthritis    Cancer (HCC)    endometrial   Chicken pox    Diabetes mellitus without  complication (HCC)    GERD (gastroesophageal reflux disease)    Heart murmur    DUE TO RHEUMATIC FEVER PER PT   Hyperlipidemia    Hypertension    Psoriasis    Rheumatic fever    Rosacea     Past Surgical History: Past Surgical History:  Procedure Laterality Date   ABDOMINAL HYSTERECTOMY     ANTERIOR AND POSTERIOR REPAIR N/A 12/04/2016   Procedure: ANTERIOR (CYSTOCELE) AND POSTERIOR REPAIR (RECTOCELE), TRANSOBTURATOR SLING PLACEMENT(ARIS);  Surgeon: Linzie Collin, MD;  Location: [facility] ORS;  Service: Gynecology;  Laterality: N/A;   COLONOSCOPY WITH PROPOFOL N/A 05/15/2021   Procedure: COLONOSCOPY WITH PROPOFOL;  Surgeon: Midge Minium, MD;  Location: [facility] ENDOSCOPY;  Service: Endoscopy;  Laterality: N/A;   DILATION AND CURETTAGE OF UTERUS N/A 09/16/2016   Procedure: DILATATION AND CURETTAGE;  Surgeon: Linzie Collin, MD;  Location: [facility] ORS;  Service: Gynecology;  Laterality: N/A;   LAPAROSCOPIC HYSTERECTOMY Bilateral 12/04/2016   Procedure: HYSTERECTOMY TOTAL LAPAROSCOPIC WITH BILATERAL SALPINGO OOPHERECTOMY;  Surgeon: Linzie Collin, MD;  Location: [facility] ORS;  Service: Gynecology;  Laterality: Bilateral;   MOUTH SURGERY      Allergies: Allergies as of 07/04/2023   (No Known Allergies)    Medications: Outpatient Encounter Medications as of 07/04/2023  Medication Sig   acetaminophen (TYLENOL) 650 MG CR tablet Take 1,300 mg by mouth daily.   busPIRone (BUSPAR) 5 MG tablet TAKE 1 TABLET EVERY DAY AS NEEDED   cetirizine (ZYRTEC) 10 MG tablet Take 10 mg by mouth at bedtime.  Cholecalciferol 25 MCG (1000 UT) tablet Take 1,000 Units by mouth daily.   doxycycline (ADOXA) 100 MG tablet Take 50 mg by mouth daily.   ezetimibe (ZETIA) 10 MG tablet Take 1 tablet (10 mg total) by mouth daily.   fluticasone (FLONASE) 50 MCG/ACT nasal spray Place 2 sprays into both nostrils daily.   folic acid (FOLVITE) 1 MG tablet Take 1 mg by mouth daily.   furosemide (LASIX) 40 MG tablet TAKE 1  TABLET EVERY DAY   gabapentin (NEURONTIN) 300 MG capsule TAKE 2 CAPSULES THREE TIMES DAILY   Golimumab (SIMPONI ARIA IV) Inject into the vein. Reports receives via infusion from Rheumatology clinic   losartan (COZAAR) 50 MG tablet Take 1 tablet (50 mg total) by mouth daily.   metFORMIN (GLUCOPHAGE-XR) 500 MG 24 hr tablet Take 1 tablet (500 mg total) by mouth 2 (two) times daily with a meal.   methotrexate (RHEUMATREX) 2.5 MG tablet Take 25 mg by mouth every Friday. Caution:Chemotherapy. Protect from light.   Omega-3 Fatty Acids (FISH OIL PO) Take 1 capsule by mouth in the morning. 1040 mg of fish oil in each capsule   omeprazole (PRILOSEC) 40 MG capsule Take 1 capsule (40 mg total) by mouth daily.   predniSONE (DELTASONE) 5 MG tablet Take 5 mg by mouth daily.   RESTASIS 0.05 % ophthalmic emulsion Place 1 drop into both eyes 2 (two) times daily.   rivaroxaban (XARELTO) 10 MG TABS tablet Take 1 tablet (10 mg total) by mouth daily.   rosuvastatin (CRESTOR) 10 MG tablet Take 1 tablet (10 mg total) by mouth daily.   RYBELSUS 7 MG TABS TAKE 1 TABLET EVERY DAY   [DISCONTINUED] mupirocin ointment (BACTROBAN) 2 % Apply 1 Application topically 2 (two) times daily.   No facility-administered encounter medications on file as of 07/04/2023.    Social History: Social History   Tobacco Use   Smoking status: Former    Current packs/day: 0.00    Average packs/day: 4.0 packs/day for 16.0 years (64.0 ttl pk-yrs)    Types: Cigarettes    Start date: 09/12/1979    Quit date: 09/12/1995    Years since quitting: 27.8   Smokeless tobacco: Never   Tobacco comments:    quit in 1997  Vaping Use   Vaping status: Never Used  Substance Use Topics   Alcohol use: No    Alcohol/week: 0.0 standard drinks of alcohol   Drug use: No    Family Medical History: Family History  Problem Relation Age of Onset   Cancer Mother        skin   Dementia Mother    Cancer Sister        skin and liver   Stroke Maternal  Grandmother    Skin cancer Brother    Diabetes Neg Hx    Heart disease Neg Hx    Breast cancer Neg Hx     Physical Examination: There were no vitals filed for this visit.  General: Patient is well developed, well nourished, calm, collected, and in no apparent distress. Attention to examination is appropriate.  Respiratory: Patient is breathing without any difficulty.   NEUROLOGICAL:     Awake, alert, oriented to person, place, and time.  Speech is clear and fluent. Fund of knowledge is appropriate.   Cranial Nerves: Pupils equal round and reactive to light.  Facial tone is symmetric.    No posterior cervical tenderness. No tenderness in bilateral trapezial region.   No posterior lumbar tenderness.  No abnormal lesions on exposed skin.   Strength: Side Biceps Triceps Deltoid Interossei Grip Wrist Ext. Wrist Flex.  R 5 5 5 5 5 5 5   L 5 5 5 5 5 5 5    Side Iliopsoas Quads Hamstring PF DF EHL  R 5 5 5 5 5 5   L 5 5 5 5 5 5    Reflexes are 1+ and symmetric at the biceps, brachioradialis, patella and achilles.   Hoffman's is absent.  Clonus is not present.   Bilateral upper and lower extremity sensation is intact to light touch.     Gait is slow and unsteady.     Medical Decision Making  Imaging: No recent cervical or lumbar imaging.   Assessment and Plan: Ms. Pillard has intermittent LBP with bilateral leg pain (entire leg) to her knees x years. No numbness or tingling in her legs. She has weakness in both legs. Her legs feel like "jello." Pain is worse with any increased activity such as house work (cooking, Pharmacologist, vacuuming). Pain is better when sitting for short periods of time.   She has known OA of both knees and needs TKAs. Previous lumbar MRI in 2019 showed advanced spinal stenosis at L4-L5.   She notes intermittent right sided neck pain. No arm pain. No numbness or tingling. She has issues with hand dexterity, she drops things. She has balance issues that are  getting worse.   No cervical imaging is available.   Treatment options discussed with patient and following plan made:   - MRI of lumbar spine ordered to evaluate spinal stenosis. No improvement with time or medications.  - MRI of cervical spine ordered to evaluate chronic neck pain and also evaluate dexterity/balance issues.  - Depending on results of MRI scans, may consider PT and/or injections.  - She hates needles, but did okay with previous injection with Dr. Yves Dill. She had to take valium prior to procedure.  - Will schedule follow up visit to review MRI results once I get them back.   I spent a total of 30 minutes in face-to-face and non-face-to-face activities related to this patient's care today including review of outside records, review of imaging, review of symptoms, physical exam, discussion of differential diagnosis, discussion of treatment options, and documentation.   Thank you for involving me in the care of this patient.   Drake Leach PA-C Dept. of Neurosurgery

## 2023-07-02 ENCOUNTER — Ambulatory Visit: Payer: Medicare Other | Admitting: Orthopedic Surgery

## 2023-07-04 ENCOUNTER — Encounter: Payer: Self-pay | Admitting: Orthopedic Surgery

## 2023-07-04 ENCOUNTER — Ambulatory Visit (INDEPENDENT_AMBULATORY_CARE_PROVIDER_SITE_OTHER): Payer: Medicare Other | Admitting: Orthopedic Surgery

## 2023-07-04 VITALS — BP 134/80 | Ht 60.0 in | Wt 251.0 lb

## 2023-07-04 DIAGNOSIS — R2689 Other abnormalities of gait and mobility: Secondary | ICD-10-CM

## 2023-07-04 DIAGNOSIS — M48061 Spinal stenosis, lumbar region without neurogenic claudication: Secondary | ICD-10-CM

## 2023-07-04 DIAGNOSIS — R278 Other lack of coordination: Secondary | ICD-10-CM

## 2023-07-04 DIAGNOSIS — M542 Cervicalgia: Secondary | ICD-10-CM

## 2023-07-04 DIAGNOSIS — M5416 Radiculopathy, lumbar region: Secondary | ICD-10-CM

## 2023-07-04 NOTE — Patient Instructions (Signed)
It was so nice to see you today. Thank you so much for coming in.    I want to get an MRI of your neck and lower back to look into things further. We will get this approved through your insurance and [facility] will call you to schedule the appointment. Be sure you are scheduled for the WIDE BORE MRI.   After you have the MRIs, it takes 14-21 days for me to get the results back. Once I have them, we will call you to schedule a follow up visit with me to review them.   Please do not hesitate to call if you have any questions or concerns. You can also message me in MyChart.   Drake Leach PA-C [phone]     The physicians and staff at [facility] at [facility] are committed to providing excellent care. You may receive a survey asking for feedback about your experience at our office. We value you your feedback and appreciate you taking the time to to fill it out. The [facility] leadership team is also available to discuss your experience in person, feel free to contact us [phone].

## 2023-07-08 ENCOUNTER — Ambulatory Visit: Payer: Medicare Other | Attending: Internal Medicine

## 2023-07-08 DIAGNOSIS — I5032 Chronic diastolic (congestive) heart failure: Secondary | ICD-10-CM | POA: Diagnosis present

## 2023-07-08 LAB — ECHOCARDIOGRAM COMPLETE
AR max vel: 2.22 cm2
AV Area VTI: 2.43 cm2
AV Area mean vel: 2.2 cm2
AV Mean grad: 4 mm[Hg]
AV Peak grad: 6.8 mm[Hg]
Ao pk vel: 1.3 m/s
Area-P 1/2: 3.37 cm2
Calc EF: 57.6 %
S' Lateral: 2.8 cm
Single Plane A2C EF: 58.5 %
Single Plane A4C EF: 56.4 %

## 2023-07-09 ENCOUNTER — Encounter: Payer: Self-pay | Admitting: Internal Medicine

## 2023-07-11 ENCOUNTER — Ambulatory Visit
Admission: RE | Admit: 2023-07-11 | Discharge: 2023-07-11 | Disposition: A | Discharge status: Home / Self Care | Payer: Medicare Other | Source: Ambulatory Visit | Attending: Orthopedic Surgery | Admitting: Orthopedic Surgery

## 2023-07-11 DIAGNOSIS — R278 Other lack of coordination: Secondary | ICD-10-CM

## 2023-07-11 DIAGNOSIS — M542 Cervicalgia: Secondary | ICD-10-CM

## 2023-07-11 DIAGNOSIS — M5416 Radiculopathy, lumbar region: Secondary | ICD-10-CM | POA: Insufficient documentation

## 2023-07-11 DIAGNOSIS — M48061 Spinal stenosis, lumbar region without neurogenic claudication: Secondary | ICD-10-CM | POA: Insufficient documentation

## 2023-07-11 DIAGNOSIS — R2689 Other abnormalities of gait and mobility: Secondary | ICD-10-CM | POA: Insufficient documentation

## 2023-07-16 ENCOUNTER — Encounter: Payer: Self-pay | Admitting: Internal Medicine

## 2023-07-16 ENCOUNTER — Other Ambulatory Visit: Payer: Self-pay

## 2023-07-16 MED ORDER — RYBELSUS 14 MG PO TABS: 14.0000 mg | ORAL_TABLET | Freq: Every day | ORAL | 1 refills | Status: DC

## 2023-07-16 MED ORDER — FUROSEMIDE 40 MG PO TABS: 40.0000 mg | ORAL_TABLET | Freq: Two times a day (BID) | ORAL | 1 refills | Status: DC

## 2023-08-06 NOTE — Progress Notes (Unsigned)
 Referring Physician:  Lorre Munroe, NP [address] [place],  [place] [address]  Primary Physician:  Lorre Munroe, NP  History of Present Illness: [name] has a history of heart failure, HTN, GERD, hyperlipidemia, DM with neuropathy, psoriatic arthritis, history of DVT, history of endometrial CA, obesity.   Last seen by me on 07/04/23 for neck and back pain. She has known OA of both knees and needs TKAs. Previous lumbar MRI in 2019 showed advanced spinal stenosis at L4-L5.   She is here to review her cervical and lumbar MRI results.   She continues with intermittent right sided neck pain. No arm pain- had one episode of right arm pain to her elbow last week that resolved. No numbness or tingling. She has issues with hand dexterity, she drops things.   She has intermittent LBP with bilateral leg pain (entire leg) to her knees x years. No numbness or tingling in her legs. She has weakness in both legs. Her legs feel like "jello." Pain is worse with any increased activity such as house work (cooking, Pharmacologist, vacuuming). Pain is better when sitting for short periods of time.   She has known OA of both knees and needs TKAs.   She is on XARELTO. She takes prn tylenol, neurontin, and prednisone.   She does not smoke. Quit in 1997.   Bowel/Bladder Dysfunction: she has urinary and bowel urgency x months, no perineal numbness.   Conservative measures: chiropractor, dry needling Physical therapy: has participated in PT 2015-2016 Multimodal medical therapy including regular antiinflammatories: tylenol, gabapentin  Injections: has received 1 epidural steroid injection around 2015-2016  Past Surgery: no spinal surgeries.  Melissa Cabrera has issued with hand dexterity, dropping things, and balance issues.   The symptoms are causing a significant impact on the patient's life.   Review of Systems:  A 10 point review of systems is negative, except for the pertinent positives and  negatives detailed in the HPI.  Past Medical History: Past Medical History:  Diagnosis Date   Allergy    Arthritis    Cancer (HCC)    endometrial   Chicken pox    Diabetes mellitus without complication (HCC)    GERD (gastroesophageal reflux disease)    Heart murmur    DUE TO RHEUMATIC FEVER PER PT   Hyperlipidemia    Hypertension    Psoriasis    Rheumatic fever    Rosacea     Past Surgical History: Past Surgical History:  Procedure Laterality Date   ABDOMINAL HYSTERECTOMY     ANTERIOR AND POSTERIOR REPAIR N/A 12/04/2016   Procedure: ANTERIOR (CYSTOCELE) AND POSTERIOR REPAIR (RECTOCELE), TRANSOBTURATOR SLING PLACEMENT(ARIS);  Surgeon: Linzie Collin, MD;  Location: [facility] ORS;  Service: Gynecology;  Laterality: N/A;   COLONOSCOPY WITH PROPOFOL N/A 05/15/2021   Procedure: COLONOSCOPY WITH PROPOFOL;  Surgeon: Midge Minium, MD;  Location: [facility] ENDOSCOPY;  Service: Endoscopy;  Laterality: N/A;   DILATION AND CURETTAGE OF UTERUS N/A 09/16/2016   Procedure: DILATATION AND CURETTAGE;  Surgeon: Linzie Collin, MD;  Location: [facility] ORS;  Service: Gynecology;  Laterality: N/A;   LAPAROSCOPIC HYSTERECTOMY Bilateral 12/04/2016   Procedure: HYSTERECTOMY TOTAL LAPAROSCOPIC WITH BILATERAL SALPINGO OOPHERECTOMY;  Surgeon: Linzie Collin, MD;  Location: [facility] ORS;  Service: Gynecology;  Laterality: Bilateral;   MOUTH SURGERY      Allergies: Allergies as of 08/07/2023   (No Known Allergies)    Medications: Outpatient Encounter Medications as of 08/07/2023  Medication Sig   acetaminophen (  TYLENOL) 650 MG CR tablet Take 1,300 mg by mouth daily.   busPIRone (BUSPAR) 5 MG tablet TAKE 1 TABLET EVERY DAY AS NEEDED   cetirizine (ZYRTEC) 10 MG tablet Take 10 mg by mouth at bedtime.    Cholecalciferol 25 MCG (1000 UT) tablet Take 1,000 Units by mouth daily.   doxycycline (ADOXA) 100 MG tablet Take 50 mg by mouth daily.   ezetimibe (ZETIA) 10 MG tablet Take 1 tablet (10 mg total) by mouth daily.    fluticasone (FLONASE) 50 MCG/ACT nasal spray Place 2 sprays into both nostrils daily.   folic acid (FOLVITE) 1 MG tablet Take 1 mg by mouth daily.   furosemide (LASIX) 40 MG tablet Take 1 tablet (40 mg total) by mouth 2 (two) times daily.   gabapentin (NEURONTIN) 300 MG capsule TAKE 2 CAPSULES THREE TIMES DAILY   Golimumab (SIMPONI ARIA IV) Inject into the vein. Reports receives via infusion from Rheumatology clinic   losartan (COZAAR) 50 MG tablet Take 1 tablet (50 mg total) by mouth daily.   metFORMIN (GLUCOPHAGE-XR) 500 MG 24 hr tablet Take 1 tablet (500 mg total) by mouth 2 (two) times daily with a meal.   methotrexate (RHEUMATREX) 2.5 MG tablet Take 25 mg by mouth every Friday. Caution:Chemotherapy. Protect from light.   Omega-3 Fatty Acids (FISH OIL PO) Take 1 capsule by mouth in the morning. 1040 mg of fish oil in each capsule   omeprazole (PRILOSEC) 40 MG capsule Take 1 capsule (40 mg total) by mouth daily.   predniSONE (DELTASONE) 5 MG tablet Take 5 mg by mouth daily.   rivaroxaban (XARELTO) 10 MG TABS tablet Take 1 tablet (10 mg total) by mouth daily.   rosuvastatin (CRESTOR) 10 MG tablet Take 1 tablet (10 mg total) by mouth daily.   Semaglutide (RYBELSUS) 14 MG TABS Take 1 tablet (14 mg total) by mouth daily.   No facility-administered encounter medications on file as of 08/07/2023.    Social History: Social History   Tobacco Use   Smoking status: Former    Current packs/day: 0.00    Average packs/day: 4.0 packs/day for 16.0 years (64.0 ttl pk-yrs)    Types: Cigarettes    Start date: 09/12/1979    Quit date: 09/12/1995    Years since quitting: 27.9   Smokeless tobacco: Never   Tobacco comments:    quit in 1997  Vaping Use   Vaping status: Never Used  Substance Use Topics   Alcohol use: No    Alcohol/week: 0.0 standard drinks of alcohol   Drug use: No    Family Medical History: Family History  Problem Relation Age of Onset   Cancer Mother        skin   Dementia  Mother    Cancer Sister        skin and liver   Stroke Maternal Grandmother    Skin cancer Brother    Diabetes Neg Hx    Heart disease Neg Hx    Breast cancer Neg Hx     Physical Examination: There were no vitals filed for this visit.    Awake, alert, oriented to person, place, and time.  Speech is clear and fluent. Fund of knowledge is appropriate.   Cranial Nerves: Pupils equal round and reactive to light.  Facial tone is symmetric.    No abnormal lesions on exposed skin.   Strength: Side Biceps Triceps Deltoid Interossei Grip Wrist Ext. Wrist Flex.  R 5 5 5 5 5 5  5  L 5 5 5 5 5 5 5    Side Iliopsoas Quads Hamstring PF DF EHL  R 5 5 5 5 5 5   L 5 5 5 5 5 5    Reflexes are 1+ and symmetric at the biceps, brachioradialis, patella and achilles.   Hoffman's is absent.  Clonus is not present.   Bilateral upper and lower extremity sensation is intact to light touch.     Gait is slow and unsteady.     Medical Decision Making  Imaging: Cervical MRI dated 07/11/23:  FINDINGS: Straightening of the normal cervical lordosis with mild reversal could relate to muscle spasm or could be positional.   No prevertebral soft tissue swelling.   No significant listhesis.   No significant vertebral body height loss.   Multilevel moderate disc space narrowing from C3-4 through C6-7 and also at T1-2.   Cervical spinal cord is normal in signal intensity.   Bone marrow signal is mildly heterogeneous.   Imaging to the visualized portions of the brain parenchyma reveals no acute finding.   C2-3: No significant central canal narrowing. No significant neural foraminal narrowing.   C3-4: Moderate central canal stenosis from disc osteophyte complex causes some flattening of the cervical spinal cord. Moderate right and mild left neural foraminal narrowing from disc osteophyte and uncovertebral hypertrophy.   C4-5: Moderate central canal stenosis from disc osteophyte complex causes some  flattening of the cervical spinal cord. Moderate bilateral neural foramen narrowing from disc osteophyte and uncovertebral hypertrophy.   C5-6: Moderate central canal stenosis from disc osteophyte complex causes some flattening of the cervical spinal cord. Mild right and moderate left neural foraminal narrowing from disc osteophyte and uncovertebral hypertrophy.   C6-7: Mild central canal stenosis from disc osteophyte complex. Mild bilateral neural foraminal narrowing from disc osteophyte and uncovertebral hypertrophy.   C7-T1: No significant central canal narrowing. No significant neural foraminal narrowing.   IMPRESSION: IMPRESSION *Multilevel degenerative change is most notable at C3-4, C4-5, and C5-6 where there is moderate central canal and neural foraminal narrowing. *Please see above for more details.     Electronically Signed   By: Ala Bent M.D.   On: 08/02/2023 10:06   Lumbar MRI dated 07/11/23:  FINDINGS: FINDINGS For the purposes of this dictation, the L5-S1 disc space is on image 27, series 21.   Grade 1 anterolisthesis of L4 on L5 has increased and now measures roughly 6.5 mm (previously this measured roughly 3 mm).   No significant vertebral body height loss.   Multilevel disc space narrowing has increased and is now severe at L5-S1.   There is disc desiccation to varying degrees at every level   Bone marrow endplate edematous change is noted at L5-S1.   Conus ends at the upper L1 level.   Mild degenerative change of the partially imaged sacroiliac joints bilaterally.   T12-L1 : Small broad-based disc bulge causes no significant central canal narrowing. No significant neural foraminal narrowing. Mild bilateral facet degenerative change.   L1-2: No significant central canal narrowing. No significant neural foraminal narrowing. Moderate bilateral facet degenerative change.   L2-3: Mild-moderate central canal stenosis from broad-based  disc bulge is new. Moderate bilateral neural foramen narrowing is new. Insert effusions   L3-4: Small broad-based disc bulge causes no significant central canal narrowing. Mild right neural foramen narrowing is new. No significant left neural foraminal narrowing. Mild bilateral facet degenerative change.   L4-5: Severe central canal stenosis from unroofing of disc material is increased there is  superimposed severe left subarticular recess narrowing from left paracentral disc extrusion which is new. Severe right and moderate left neural foramen narrowing has increased. Severe bilateral facet degenerative change.   L5-S1: No significant central canal narrowing. Mild right and moderate left neural foramen narrowing is unchanged significantly. Mild bilateral facet degenerative change.   IMPRESSION: IMPRESSION *Multilevel degenerative change as increased a few levels and is most notable at L4-5 where there is severe central canal and left subarticular recess narrowing. *Please see above for more details.     Electronically Signed   By: Ala Bent M.D.   On: 08/02/2023 10:13   I have personally reviewed the images and agree with the above interpretation.   Assessment and Plan: Ms. Co continues with intermittent right sided neck pain. No arm pain- had one episode of right arm pain to her elbow last week that resolved. No numbness or tingling. She has issues with hand dexterity, she drops things.  She has known moderate central stenosis C3-C6 with multilevel foraminal stenosis. No signs of myelopathy on exam besides unsteady gait. She notes worsening balance issues and dexterity issues.   Her primary complaint is intermittent LBP with bilateral leg pain (entire leg) to her knees. No numbness or tingling in her legs. She has weakness in both legs. Her legs feel like "jello." Pain is worse with any increased activity such as house work (cooking, Pharmacologist, vacuuming).   She has  known slip L4-L5 with severe central stenosis, severe left lateral recess stenosis, severe right/moderate moderate left foraminal stenosis, and severe DDD. Also with mild central stenosis L2-L3 and multilevel foraminal stenosis.   LBP likely from lumbar spondylosis and DDD. Leg symptoms likely from severe stenosis L4-L5.   Treatment options discussed with patient and following plan made:   - Follow up with Dr. Katrinka Blazing to discuss cervical stenosis. Only moderate, but she has significant balance and dexterity issues.  - Recommend PT for lumbar spine. She declines until she loses weight.  - Weight loss discussed and encouraged. Goal weight for surgery is 210-230 lbs. Will continue to work with PCP regarding this. She declined referral to [facility]. Declined referral to dietician.  - Referral back to Dr. Yves Dill to revisit lumbar injections. She  hates needles, but did okay with previous injection with Dr. Yves Dill. She had to take valium prior to procedure.  - Consider lumbar xrays with flex/ext at her follow up.  - She will see Dr. Katrinka Blazing as scheduled. Will make follow up with me regarding lumbar spine in 6-8 weeks and call her with this appointment.   I spent a total of 30 minutes in face-to-face and non-face-to-face activities related to this patient's care today including review of outside records, review of imaging, review of symptoms, physical exam, discussion of differential diagnosis, discussion of treatment options, and documentation.   Drake Leach PA-C Dept. of Neurosurgery

## 2023-08-07 ENCOUNTER — Telehealth: Payer: Self-pay | Admitting: Orthopedic Surgery

## 2023-08-07 ENCOUNTER — Encounter: Payer: Self-pay | Admitting: Orthopedic Surgery

## 2023-08-07 ENCOUNTER — Ambulatory Visit (INDEPENDENT_AMBULATORY_CARE_PROVIDER_SITE_OTHER): Admitting: Orthopedic Surgery

## 2023-08-07 VITALS — BP 132/66 | Ht 60.0 in | Wt 251.0 lb

## 2023-08-07 DIAGNOSIS — M4802 Spinal stenosis, cervical region: Secondary | ICD-10-CM | POA: Diagnosis not present

## 2023-08-07 DIAGNOSIS — M4726 Other spondylosis with radiculopathy, lumbar region: Secondary | ICD-10-CM

## 2023-08-07 DIAGNOSIS — R2689 Other abnormalities of gait and mobility: Secondary | ICD-10-CM | POA: Diagnosis not present

## 2023-08-07 DIAGNOSIS — R278 Other lack of coordination: Secondary | ICD-10-CM | POA: Diagnosis not present

## 2023-08-07 DIAGNOSIS — M5416 Radiculopathy, lumbar region: Secondary | ICD-10-CM

## 2023-08-07 DIAGNOSIS — M51362 Other intervertebral disc degeneration, lumbar region with discogenic back pain and lower extremity pain: Secondary | ICD-10-CM

## 2023-08-07 DIAGNOSIS — M47816 Spondylosis without myelopathy or radiculopathy, lumbar region: Secondary | ICD-10-CM

## 2023-08-07 DIAGNOSIS — M48062 Spinal stenosis, lumbar region with neurogenic claudication: Secondary | ICD-10-CM

## 2023-08-07 NOTE — Patient Instructions (Signed)
 It was so nice to see you today. Thank you so much for coming in.    You have spinal stenosis in your neck. This may be causing the issues with your hands and your balance. I want you to see Dr. Katrinka Blazing for this.   You have spinal stenosis and wear/tear in your lower back as well.   I recommend PT for your lower back. You would need to do this prior to any consideration of surgery.   Continue to work on weight loss with your PCP. Weight goal for surgery would be 210-230 lbs.   Let me know if you want me to order PT.   Please do not hesitate to call if you have any questions or concerns. You can also message me in MyChart.   Drake Leach PA-C [phone]     The physicians and staff at [facility] are committed to providing excellent care. You may receive a survey asking for feedback about your experience at our office. We value you your feedback and appreciate you taking the time to to fill it out. The [facility] leadership team is also available to discuss your experience in person, feel free to contact us [phone].

## 2023-08-07 NOTE — Telephone Encounter (Signed)
 I saw her today.   She has f/u with Katrinka Blazing to discuss cervical stenosis.   Please make her f/u with me regarding lumbar spine in 7-8 weeks and let her know.   Thanks!

## 2023-08-13 NOTE — Progress Notes (Signed)
 Referring Physician:  Carollynn Cirri, NP [address] [place],  [place] [address]  Primary Physician:  Carollynn Cirri, NP  History of Present Illness: 08/20/2023 [name] is here today to follow-up after seeing Melissa Cabrera.  She has a history of spinal stenosis and back pain that radiates to her bilateral lower extremities and knees.  She states that her legs often feel like Jell-O.  Pain gets worse with activities and being upright.  She has a history of osteoarthritis.  She does take Xarelto .  She does not smoke.  She has had a history of urgency but no new bowel or bladder symptoms.  She does feel like she is losing dexterity in her hands and having difficulty with balance as well.  Also has a history of neck pain. I have utilized the care everywhere function in epic to review the outside records available from external health systems.  Review of Systems:  A 10 point review of systems is negative, except for the pertinent positives and negatives detailed in the HPI.  Past Medical History: Past Medical History:  Diagnosis Date   Allergy    Arthritis    Cancer (HCC)    endometrial   Chicken pox    Diabetes mellitus without complication (HCC)    GERD (gastroesophageal reflux disease)    Heart murmur    DUE TO RHEUMATIC FEVER PER PT   Hyperlipidemia    Hypertension    Psoriasis    Rheumatic fever    Rosacea     Past Surgical History: Past Surgical History:  Procedure Laterality Date   ABDOMINAL HYSTERECTOMY     ANTERIOR AND POSTERIOR REPAIR N/A 12/04/2016   Procedure: ANTERIOR (CYSTOCELE) AND POSTERIOR REPAIR (RECTOCELE), TRANSOBTURATOR SLING PLACEMENT(ARIS);  Surgeon: Zenobia Hila, MD;  Location: [facility] ORS;  Service: Gynecology;  Laterality: N/A;   COLONOSCOPY WITH PROPOFOL  N/A 05/15/2021   Procedure: COLONOSCOPY WITH PROPOFOL ;  Surgeon: Marnee Sink, MD;  Location: [facility] ENDOSCOPY;  Service: Endoscopy;  Laterality: N/A;   DILATION AND CURETTAGE OF UTERUS N/A 09/16/2016    Procedure: DILATATION AND CURETTAGE;  Surgeon: Zenobia Hila, MD;  Location: [facility] ORS;  Service: Gynecology;  Laterality: N/A;   LAPAROSCOPIC HYSTERECTOMY Bilateral 12/04/2016   Procedure: HYSTERECTOMY TOTAL LAPAROSCOPIC WITH BILATERAL SALPINGO OOPHERECTOMY;  Surgeon: Zenobia Hila, MD;  Location: [facility] ORS;  Service: Gynecology;  Laterality: Bilateral;   MOUTH SURGERY      Allergies: Allergies as of 08/20/2023   (No Known Allergies)    Medications:  Current Outpatient Medications:    acetaminophen  (TYLENOL ) 650 MG CR tablet, Take 1,300 mg by mouth daily., Disp: , Rfl:    busPIRone  (BUSPAR ) 5 MG tablet, TAKE 1 TABLET EVERY DAY AS NEEDED, Disp: 90 tablet, Rfl: 1   cetirizine (ZYRTEC) 10 MG tablet, Take 10 mg by mouth at bedtime. , Disp: , Rfl:    Cholecalciferol 25 MCG (1000 UT) tablet, Take 1,000 Units by mouth daily., Disp: , Rfl:    doxycycline (ADOXA) 100 MG tablet, Take 50 mg by mouth daily., Disp: , Rfl:    ezetimibe  (ZETIA ) 10 MG tablet, Take 1 tablet (10 mg total) by mouth daily., Disp: 90 tablet, Rfl: 1   fluticasone  (FLONASE ) 50 MCG/ACT nasal spray, Place 2 sprays into both nostrils daily., Disp: 16 g, Rfl: 6   folic acid (FOLVITE) 1 MG tablet, Take 1mg  by mouth Sunday to Thursday and 2mg  on Friday and Saturday, Disp: , Rfl:    furosemide  (LASIX )  40 MG tablet, Take 1 tablet (40 mg total) by mouth 2 (two) times daily., Disp: 180 tablet, Rfl: 1   gabapentin  (NEURONTIN ) 300 MG capsule, TAKE 2 CAPSULES THREE TIMES DAILY, Disp: 540 capsule, Rfl: 1   Golimumab (SIMPONI ARIA IV), Inject into the vein. Reports receives via infusion from Rheumatology clinic, Disp: , Rfl:    hydroxychloroquine (PLAQUENIL) 200 MG tablet, 1 tab Orally twice a day for 90 days, Disp: , Rfl:    losartan  (COZAAR ) 50 MG tablet, Take 1 tablet (50 mg total) by mouth daily., Disp: 90 tablet, Rfl: 1   metFORMIN  (GLUCOPHAGE -XR) 500 MG 24 hr tablet, Take 1 tablet (500 mg total) by mouth 2 (two) times daily  with a meal., Disp: 180 tablet, Rfl: 1   methotrexate (RHEUMATREX) 2.5 MG tablet, Take 25 mg by mouth every Friday. Caution:Chemotherapy. Protect from light., Disp: , Rfl:    Omega-3 Fatty Acids (FISH OIL PO), Take 1 capsule by mouth in the morning. 1040 mg of fish oil in each capsule, Disp: , Rfl:    omeprazole  (PRILOSEC) 40 MG capsule, Take 1 capsule (40 mg total) by mouth daily., Disp: 90 capsule, Rfl: 1   predniSONE  (DELTASONE ) 5 MG tablet, Take 5 mg by mouth daily., Disp: , Rfl:    rivaroxaban  (XARELTO ) 10 MG TABS tablet, Take 1 tablet (10 mg total) by mouth daily., Disp: 90 tablet, Rfl: 1   rosuvastatin  (CRESTOR ) 10 MG tablet, Take 1 tablet (10 mg total) by mouth daily., Disp: 90 tablet, Rfl: 1   Semaglutide  (RYBELSUS ) 14 MG TABS, Take 1 tablet (14 mg total) by mouth daily., Disp: 90 tablet, Rfl: 1  Social History: Social History   Tobacco Use   Smoking status: Former    Current packs/day: 0.00    Average packs/day: 4.0 packs/day for 16.0 years (64.0 ttl pk-yrs)    Types: Cigarettes    Start date: 09/12/1979    Quit date: 09/12/1995    Years since quitting: 27.9   Smokeless tobacco: Never   Tobacco comments:    quit in 1997  Vaping Use   Vaping status: Never Used  Substance Use Topics   Alcohol use: No    Alcohol/week: 0.0 standard drinks of alcohol   Drug use: No    Family Medical History: Family History  Problem Relation Age of Onset   Cancer Mother        skin   Dementia Mother    Cancer Sister        skin and liver   Stroke Maternal Grandmother    Skin cancer Brother    Diabetes Neg Hx    Heart disease Neg Hx    Breast cancer Neg Hx     Physical Examination: Vitals:   08/20/23 0905  BP: 118/78    General: Patient is in no apparent distress. Attention to examination is appropriate.  Neck:   Supple.  Full range of motion.  Respiratory: Patient is breathing without any difficulty.   NEUROLOGICAL:     Awake, alert, oriented to person, place, and time.   Speech is clear and fluent.   Cranial Nerves: Pupils equal round and reactive to light.  Facial tone is symmetric.  Facial sensation is symmetric. Shoulder shrug is symmetric. Tongue protrusion is midline.    Strength: Is maintained in her upper and lower extremities proximally distally.  She does have some giveaway phenomenon throughout unclear whether or not this is true neurologic weakness or pain mediated weakness.  Reflexes are mostly 2+  throughout, possible Hoffmann sign possible spreading at her brachial radialis.   Imaging: Narrative & Impression  CLINICAL DATA:  Bilateral leg pain.   EXAM: MRI LUMBAR SPINE WITHOUT CONTRAST   TECHNIQUE: Multiplanar, multisequence MR imaging of the lumbar spine was performed. No intravenous contrast was administered.   COMPARISON:  Lumbar MRI 04/23/2018.   FINDINGS: FINDINGS For the purposes of this dictation, the L5-S1 disc space is on image 27, series 21.   Grade 1 anterolisthesis of L4 on L5 has increased and now measures roughly 6.5 mm (previously this measured roughly 3 mm).   No significant vertebral body height loss.   Multilevel disc space narrowing has increased and is now severe at L5-S1.   There is disc desiccation to varying degrees at every level   Bone marrow endplate edematous change is noted at L5-S1.   Conus ends at the upper L1 level.   Mild degenerative change of the partially imaged sacroiliac joints bilaterally.   T12-L1 : Small broad-based disc bulge causes no significant central canal narrowing. No significant neural foraminal narrowing. Mild bilateral facet degenerative change.   L1-2: No significant central canal narrowing. No significant neural foraminal narrowing. Moderate bilateral facet degenerative change.   L2-3: Mild-moderate central canal stenosis from broad-based disc bulge is new. Moderate bilateral neural foramen narrowing is new. Insert effusions   L3-4: Small broad-based disc bulge  causes no significant central canal narrowing. Mild right neural foramen narrowing is new. No significant left neural foraminal narrowing. Mild bilateral facet degenerative change.   L4-5: Severe central canal stenosis from unroofing of disc material is increased there is superimposed severe left subarticular recess narrowing from left paracentral disc extrusion which is new. Severe right and moderate left neural foramen narrowing has increased. Severe bilateral facet degenerative change.   L5-S1: No significant central canal narrowing. Mild right and moderate left neural foramen narrowing is unchanged significantly. Mild bilateral facet degenerative change.   IMPRESSION: IMPRESSION *Multilevel degenerative change as increased a few levels and is most notable at L4-5 where there is severe central canal and left subarticular recess narrowing. *Please see above for more details.     Electronically Signed   By: Bryon  Nastasi M.D.   On: 08/02/2023 10:13   Narrative & Impression  CLINICAL DATA:  Right lateral neck pain intermittent x2 years.   EXAM: MRI CERVICAL SPINE WITHOUT CONTRAST   TECHNIQUE: Multiplanar, multisequence MR imaging of the cervical spine was performed. No intravenous contrast was administered.   COMPARISON:  None Available.   FINDINGS: Straightening of the normal cervical lordosis with mild reversal could relate to muscle spasm or could be positional.   No prevertebral soft tissue swelling.   No significant listhesis.   No significant vertebral body height loss.   Multilevel moderate disc space narrowing from C3-4 through C6-7 and also at T1-2.   Cervical spinal cord is normal in signal intensity.   Bone marrow signal is mildly heterogeneous.   Imaging to the visualized portions of the brain parenchyma reveals no acute finding.   C2-3: No significant central canal narrowing. No significant neural foraminal narrowing.   C3-4: Moderate  central canal stenosis from disc osteophyte complex causes some flattening of the cervical spinal cord. Moderate right and mild left neural foraminal narrowing from disc osteophyte and uncovertebral hypertrophy.   C4-5: Moderate central canal stenosis from disc osteophyte complex causes some flattening of the cervical spinal cord. Moderate bilateral neural foramen narrowing from disc osteophyte and uncovertebral hypertrophy.   C5-6:  Moderate central canal stenosis from disc osteophyte complex causes some flattening of the cervical spinal cord. Mild right and moderate left neural foraminal narrowing from disc osteophyte and uncovertebral hypertrophy.   C6-7: Mild central canal stenosis from disc osteophyte complex. Mild bilateral neural foraminal narrowing from disc osteophyte and uncovertebral hypertrophy.   C7-T1: No significant central canal narrowing. No significant neural foraminal narrowing.   IMPRESSION: IMPRESSION *Multilevel degenerative change is most notable at C3-4, C4-5, and C5-6 where there is moderate central canal and neural foraminal narrowing. *Please see above for more details.     Electronically Signed   By: Bryon  Nastasi M.D.   On: 08/02/2023 10:06      I have personally reviewed the images and agree with the above interpretation.  Medical Decision Making/Assessment and Plan: Melissa Cabrera is a pleasant 68 y.o. female with presenting with numerous issues for evaluation today.  She has a history of symptoms consistent with lumbar claudication and this has been getting spinal injections, she has not yet worked with physical therapy specifically for her lumbar issues so we will plan on making a referral there.  She also has a history of neck pain and radiating pain into her arms and shoulders as well as radiating pain into her legs which could be consistent with cervical radiculopathy or cervical myeloradiculopathy.  She does have a questionable spreading of her  reflexes.  Given her somewhat difficult to localize examination I feel like she would be served best by a 4 extremity EMG to evaluate for any signs of radiculopathy mononeuropathies or polyneuropathy.  Will make referral to neurology for EMG/nerve conduction study evaluation.  We also recommend that she continue to work with her pain team for injections.  Would like to follow-up with her after her nerve conduction study to see if this helps us  localize targets for intervention.  She does have diffuse spondylosis throughout her cervical and lumbar spine which could be causing compressive like symptoms however would like to localize these better before intervening.  Thank you for involving me in the care of this patient.    Carroll Clamp MD/MSCR Neurosurgery

## 2023-08-20 ENCOUNTER — Ambulatory Visit
Admission: RE | Admit: 2023-08-20 | Discharge: 2023-08-20 | Disposition: A | Discharge status: Home / Self Care | Attending: Neurosurgery | Admitting: Neurosurgery

## 2023-08-20 ENCOUNTER — Ambulatory Visit
Admission: RE | Admit: 2023-08-20 | Discharge: 2023-08-20 | Disposition: A | Discharge status: Home / Self Care | Source: Ambulatory Visit | Attending: Neurosurgery | Admitting: Neurosurgery

## 2023-08-20 ENCOUNTER — Ambulatory Visit: Admitting: Neurosurgery

## 2023-08-20 VITALS — BP 118/78 | Ht 60.0 in | Wt 251.0 lb

## 2023-08-20 DIAGNOSIS — M48062 Spinal stenosis, lumbar region with neurogenic claudication: Secondary | ICD-10-CM | POA: Diagnosis not present

## 2023-08-20 DIAGNOSIS — M51362 Other intervertebral disc degeneration, lumbar region with discogenic back pain and lower extremity pain: Secondary | ICD-10-CM | POA: Insufficient documentation

## 2023-08-20 DIAGNOSIS — M4802 Spinal stenosis, cervical region: Secondary | ICD-10-CM | POA: Insufficient documentation

## 2023-08-20 DIAGNOSIS — M4722 Other spondylosis with radiculopathy, cervical region: Secondary | ICD-10-CM

## 2023-08-20 DIAGNOSIS — M47816 Spondylosis without myelopathy or radiculopathy, lumbar region: Secondary | ICD-10-CM

## 2023-08-20 DIAGNOSIS — M542 Cervicalgia: Secondary | ICD-10-CM | POA: Insufficient documentation

## 2023-08-20 NOTE — Patient Instructions (Signed)
 LOCAL PHYSICAL THERAPY  [facility]  [address]  [place], [place] [address]  [phone]   [facility]  [address] [place], [place] [address]  [phone]   [facility] (2 locations)  [address]  [place], [place] [address]  [phone]          or  [address]  [place], [place] [address]  [phone]   [facility]  [address]  [place], [place] [address]  [phone]  **dry needling**   [facility] [place] [facility]  [address]  [place], [place] [address]  [phone]  Fax: [phone]  ** Aquatic therapy[address] [place], [place] [address] [phone] **Aquatic therapy**  [facility] [address] [place], [place] [address] [phone]  [facility]  [address]  [place], [place] [address]  [phone]   [facility]  [address]  [place], [place] [address]  [phone]   [facility]  [address]   Janora Norlander  [place], [place] [address]  [phone]   [facility]  [address]  [place], [place] [address]  [phone]  **dry needling**   PELVIC FLOOR/SI JOINT  [facility]  Mariane Masters, PT  [email or web address]   [place]   [facility]  [address]  Suite [place], [place] [address]  [phone]   [facility] [place]  [address], [place] [address]  [phone]   [facility] [address] [place], [place] [address]  Octavio Manns, [place]   [facility]  Raymond Gurney, PT  [address]  [place], [place] [address] [phone]   Samara Deist   [facility]  [address]   [phone]   [facility]  [address]  [phone]   [facility]  Annamaria Boots [facility]  [address]  [place], [place] [address]  [phone]  **No Aetna or medicaid**   [facility]  [phone] [address] [place] [address]  [phone]   [facility]  [address]  [place], [place] [address]  [phone]   Dannial Monarch, DC  [address], [place] [address]  [phone]   [facility]  [address], [place] [address]  [phone]   [facility]  [address]  [place], [place] [address]  [phone]  [facility]  (3 locations)  [address]  [place], [place] [address]  [phone]  **dry needling**           or  [address] [place], [place] [address]  [phone]  **Additionally has Gloris Manchester, OT**           or  [address]  [place], [place] [address]  [phone]  **Pediatric therapy**  [facility]  [address]  [phone]  **dry needlingVerdie Drown [facility]  [address]  [place], [place] [address]  [phone]  [facility]   (Inside [address] Fitness)  [address]  [place], [place] [address]  [phone]  **dry needling**  **MEDICAID  or UNINSURED** [facility] [employer] Therapy [place], [place] [address] [phone]  [facility]  [address] [place], [place] [address]  [phone]   [facility]  [address]  [place], [place] [address]  [phone]   [facility]  [address] [place], [place] [address]  [phone]   [facility]  [address]  [place], [place] [address]  [phone]   [facility]  [address]  [place], [place] [address]  [phone]   [facility]  [address]  [place], [place] [address]  [phone]   AQUATIC   Kathalene Frames [facility]   [employer]  [facility]  [place]   [place]  *Residents only*  [facility] [employer]  *[facility]  Exercise class  [facility]  Exercise class   [facility]  [address]  [place], [place]   [phone]   [facility]  [address]  [place], [place] [address]  [phone]   [place], [place]  [facility]  [address]  [phone]   [facility]  [facility]  [address]  [phone]           or  [address]  [phone]   [facility]   Provides education and support and practical information for coping with arthritis for arthritis sufferers and their families.   When: 12:15 - 1:30 p.m. the second Monday of each month, March through December  Info: Call Rehabilitation Services at [phone]

## 2023-08-25 NOTE — Telephone Encounter (Signed)
 Waiting for note to be completed then faxing to [facility]

## 2023-09-02 ENCOUNTER — Other Ambulatory Visit: Payer: Self-pay | Admitting: Internal Medicine

## 2023-09-02 DIAGNOSIS — Z1231 Encounter for screening mammogram for malignant neoplasm of breast: Secondary | ICD-10-CM

## 2023-09-09 ENCOUNTER — Telehealth: Payer: Self-pay | Admitting: Neurosurgery

## 2023-09-09 NOTE — Telephone Encounter (Signed)
 A referral was placed to Neurology on 4/9 and per their documentation was closed on 4/25 because Dr Shirlie Dove note isn't done. I think a new referral will need to be placed when his note is complete.

## 2023-09-09 NOTE — Telephone Encounter (Signed)
 Patient saw Whitney Meeler's office today and their office contacted us  to see if a referral had been placed for the patient to have an EMG done. The patient states that she was under the impression that she was to have this done. Please advise.

## 2023-09-11 ENCOUNTER — Encounter: Payer: Self-pay | Admitting: Internal Medicine

## 2023-09-11 ENCOUNTER — Encounter: Payer: Self-pay | Admitting: Neurology

## 2023-09-11 ENCOUNTER — Other Ambulatory Visit: Payer: Self-pay

## 2023-09-11 DIAGNOSIS — R202 Paresthesia of skin: Secondary | ICD-10-CM

## 2023-09-15 NOTE — Telephone Encounter (Signed)
 Looks like they are working on it now. They have a referral note in the chart on 09/11/23.

## 2023-09-25 ENCOUNTER — Ambulatory Visit: Admitting: Orthopedic Surgery

## 2023-10-23 ENCOUNTER — Ambulatory Visit (INDEPENDENT_AMBULATORY_CARE_PROVIDER_SITE_OTHER): Admitting: Neurology

## 2023-10-23 DIAGNOSIS — R202 Paresthesia of skin: Secondary | ICD-10-CM | POA: Diagnosis not present

## 2023-10-23 DIAGNOSIS — G5622 Lesion of ulnar nerve, left upper limb: Secondary | ICD-10-CM

## 2023-10-23 DIAGNOSIS — G5603 Carpal tunnel syndrome, bilateral upper limbs: Secondary | ICD-10-CM

## 2023-10-23 DIAGNOSIS — M5412 Radiculopathy, cervical region: Secondary | ICD-10-CM

## 2023-10-23 NOTE — Procedures (Signed)
 [facility]  [address]  [place], [place] [address] Tel: [phone] Fax: [phone] Test Date:  10/23/2023  Patient: Melissa Cabrera DOB: [birth year] Physician: Reyna Cava, DO  Sex: Female Height: 5' 0 Ref Phys: Henderson Lock, MD  ID#: [id number]   Technician:    History: This is a 68 year old female referred for evaluation of bilateral upper extremity paresthesias and pain.  NCV & EMG Findings: Extensive electrodiagnostic testing of the right upper extremity and additional studies of the left shows:  Bilateral median sensory responses are absent.  Left ulnar sensory response is asymmetrically reduced as compared to the right. Right median motor response is absent.  Left median motor response shows severely prolonged latency (7.8 ms) and reduced amplitude (1.7 mV).  Right ulnar motor response is within normal limits.  Left ulnar motor response shows reduced amplitude (5.8 mV) and decreased conduction velocity of the elbow (A Elbow-B Elbow, 42 m/s).   Despite maximal activation, no motor unit recruitment was seen in the right pollicis brevis muscle.  Chronic motor axonal loss changes are seen in the left abductor pollicis brevis muscle, first dorsal interosseous, pronator teres, triceps, and abductor minimi muscles.  There is no evidence of accompanying active denervation.  Impression: Bilateral median neuropathy at or distal to the wrist, consistent with a clinical diagnosis of carpal tunnel syndrome.  Overall, these findings are very severe in degree electrically and worse on the right. Left ulnar neuropathy with slowing across the elbow, demyelinating with axonal loss, moderate. Chronic C7 radiculopathy affecting the left upper extremity, mild.   ___________________________ Reyna Cava, DO    Nerve Conduction Studies   Stim Site NR Peak (ms) Norm Peak (ms) O-P Amp (V) Norm O-P Amp  Left Median Anti Sensory (2nd Digit)  32 C  Wrist *NR  <3.8  >10   Right Median Anti Sensory (2nd Digit)  32 C  Wrist *NR  <3.8  >10  Left Ulnar Anti Sensory (5th Digit)  32 C  Wrist    2.7 <3.2 8.7 >5  Right Ulnar Anti Sensory (5th Digit)  32 C  Wrist    2.4 <3.2 17.9 >5     Stim Site NR Onset (ms) Norm Onset (ms) O-P Amp (mV) Norm O-P Amp Site1 Site2 Delta-0 (ms) Dist (cm) Vel (m/s) Norm Vel (m/s)  Left Median Motor (Abd Poll Brev)  32 C  Wrist    *7.8 <4.0 *1.7 >5 Elbow Wrist 5.2 29.0 56 >50  Elbow    13.0  1.5         Right Median Motor (Abd Poll Brev)  32 C  Wrist *NR  <4.0  >5 Elbow Wrist  0.0  >50  Elbow *NR            Left Ulnar Motor (Abd Dig Minimi)  32 C  Wrist    2.3 <3.1 *5.8 >7 B Elbow Wrist 3.6 22.0 61 >50  B Elbow    5.9  4.3  A Elbow B Elbow 2.4 10.0 *42 >50  A Elbow    8.3  3.8         Right Ulnar Motor (Abd Dig Minimi)  32 C  Wrist    2.0 <3.1 8.1 >7 B Elbow Wrist 3.7 22.0 59 >50  B Elbow    5.7  7.7  A Elbow B Elbow 1.4 10.0 71 >50  A Elbow    7.1  7.3          Electromyography  Side Muscle Ins.Act Fibs Fasc Recrt Amp Dur Poly Activation Comment  Right 1stDorInt Nml Nml Nml Nml Nml Nml Nml Nml N/A  Right Abd Poll Brev Nml Nml Nml *None *- *- *- Nml *ATR  Right PronatorTeres Nml Nml Nml Nml Nml Nml Nml Nml N/A  Right Biceps Nml Nml Nml Nml Nml Nml Nml Nml N/A  Right Triceps Nml Nml Nml Nml Nml Nml Nml Nml N/A  Right Deltoid Nml Nml Nml Nml Nml Nml Nml Nml N/A  Left 1stDorInt Nml Nml Nml *2- *1+ *1+ *1+ Nml N/A  Left Abd Poll Brev Nml Nml Nml *SMU *1+ *1+ *1+ Nml *ATR  Left PronatorTeres Nml Nml Nml *1- *1+ *1+ *1+ Nml N/A  Left Biceps Nml Nml Nml Nml Nml Nml Nml Nml N/A  Left Triceps Nml Nml Nml *1- *1+ *1+ *1+ Nml N/A  Left Deltoid Nml Nml Nml Nml Nml Nml Nml Nml N/A  Left FlexCarpiUln Nml Nml Nml Nml Nml Nml Nml Nml N/A  Left Abd Dig Min Nml Nml Nml *2- *1+ *1+ *1+ Nml N/A      Waveforms:

## 2023-10-24 ENCOUNTER — Ambulatory Visit (INDEPENDENT_AMBULATORY_CARE_PROVIDER_SITE_OTHER): Admitting: Neurology

## 2023-10-24 ENCOUNTER — Ambulatory Visit: Payer: Self-pay | Admitting: Neurosurgery

## 2023-10-24 DIAGNOSIS — R202 Paresthesia of skin: Secondary | ICD-10-CM | POA: Diagnosis not present

## 2023-10-24 NOTE — Procedures (Signed)
 [facility]  [address] [place], [address]  [place], [place] [address] Tel: [phone] Fax: [phone] Test Date:  10/24/2023  Patient: Melissa Cabrera DOB: 17-Jun-1955 Physician: Reyna Cava, DO  Sex: Female Height: 5' 0 Ref Phys: Henderson Lock, MD  ID#: [id number]   Technician:    History: This is a 68 year old female referred for evaluation of bilateral lower extremity paresthesias and pain.  NCV & EMG Findings: Electrodiagnostic testing of the right lower extremity and additional studies of the left shows: Bilateral sural and superficial peroneal sensory responses are within normal limits. Bilateral peroneal and tibial motor responses are within normal limits. Bilateral tibial H reflex studies are within normal limits. There is no evidence of active or chronic motor axonal changes affecting any of the tested muscles.  Motor unit configuration and recruitment pattern is within normal limits.  Of note, proximal and deep muscles were not tested as the patient is on anticoagulation therapy.   Impression: This is a normal study of the lower extremities.  In particular, there is no evidence of a large fiber sensorimotor polyneuropathy or lumbosacral radiculopathy.    ___________________________ Reyna Cava, DO    Nerve Conduction Studies   Stim Site NR Peak (ms) Norm Peak (ms) O-P Amp (V) Norm O-P Amp  Left Sup Peroneal Anti Sensory (Ant Lat Mall)  32 C  12 cm    2.3 <4.6 6.5 >3  Right Sup Peroneal Anti Sensory (Ant Lat Mall)  32 C  12 cm    2.3 <4.6 7.3 >3  Left Sural Anti Sensory (Lat Mall)  32 C  Calf    2.3 <4.6 8.2 >3  Right Sural Anti Sensory (Lat Mall)  32 C  Calf    2.5 <4.6 7.3 >3     Stim Site NR Onset (ms) Norm Onset (ms) O-P Amp (mV) Norm O-P Amp Site1 Site2 Delta-0 (ms) Dist (cm) Vel (m/s) Norm Vel (m/s)  Left Peroneal Motor (Ext Dig Brev)  32 C  Ankle    2.9 <6.0 2.8 >2.5 B Fib Ankle 7.4 35.0 47 >40  B Fib    10.3  2.1  Poplt B Fib 1.7  8.0 47 >40  Poplt    12.0  2.0         Right Peroneal Motor (Ext Dig Brev)  32 C  Ankle    3.2 <6.0 2.6 >2.5 B Fib Ankle 7.3 37.0 51 >40  B Fib    10.5  2.2  Poplt B Fib 1.5 8.0 53 >40  Poplt    12.0  1.9         Left Tibial Motor (Abd Hall Brev)  32 C  Ankle    3.3 <6.0 4.3 >4 Knee Ankle 8.7 43.0 49 >40  Knee    12.0  4.3         Right Tibial Motor (Abd Hall Brev)  32 C  Ankle    2.0 <6.0 4.1 >4 Knee Ankle 10.0 43.0 43 >40  Knee    12.0  2.2          Electromyography   Side Muscle Ins.Act Fibs Fasc Recrt Amp Dur Poly Activation Comment  Right AntTibialis Nml Nml Nml Nml Nml Nml Nml Nml N/A  Right Gastroc Nml Nml Nml Nml Nml Nml Nml Nml N/A  Right RectFemoris Nml Nml Nml Nml Nml Nml Nml Nml N/A  Right BicepsFemS Nml Nml Nml Nml Nml Nml Nml Nml N/A  Left AntTibialis Nml Nml Nml Nml Nml  Nml Nml Nml N/A  Left Gastroc Nml Nml Nml Nml Nml Nml Nml Nml N/A  Left RectFemoris Nml Nml Nml Nml Nml Nml Nml Nml N/A  Left BicepsFemS Nml Nml Nml Nml Nml Nml Nml Nml N/A      Waveforms:

## 2023-10-31 ENCOUNTER — Encounter: Payer: Self-pay | Admitting: Internal Medicine

## 2023-11-03 ENCOUNTER — Ambulatory Visit (INDEPENDENT_AMBULATORY_CARE_PROVIDER_SITE_OTHER): Admitting: Neurosurgery

## 2023-11-03 ENCOUNTER — Encounter: Payer: Self-pay | Admitting: Neurosurgery

## 2023-11-03 VITALS — BP 136/70 | Ht 60.0 in | Wt 255.0 lb

## 2023-11-03 DIAGNOSIS — M51362 Other intervertebral disc degeneration, lumbar region with discogenic back pain and lower extremity pain: Secondary | ICD-10-CM

## 2023-11-03 DIAGNOSIS — M4316 Spondylolisthesis, lumbar region: Secondary | ICD-10-CM | POA: Diagnosis not present

## 2023-11-03 DIAGNOSIS — G5600 Carpal tunnel syndrome, unspecified upper limb: Secondary | ICD-10-CM | POA: Diagnosis not present

## 2023-11-03 NOTE — Progress Notes (Signed)
 Referring Physician:  Antonette Angeline ORN, NP [address] [place],  [place] [address]  Primary Physician:  Antonette Angeline ORN, NP  History of Present Illness: 11/03/2023 [name] is here today to follow-up after seeing Glade Boys.  She has a history of spinal stenosis and back pain that radiates to her bilateral lower extremities and knees.  She continues to have back pain radiating down her bilateral lower extremities.  She previously had neck pain and hand pain.  Has history of carpal tunnel syndrome.  Review of Systems:  A 10 point review of systems is negative, except for the pertinent positives and negatives detailed in the HPI.  Past Medical History: Past Medical History:  Diagnosis Date   Allergy    Arthritis    Cancer (HCC)    endometrial   Chicken pox    Diabetes mellitus without complication (HCC)    GERD (gastroesophageal reflux disease)    Heart murmur    DUE TO RHEUMATIC FEVER PER PT   Hyperlipidemia    Hypertension    Psoriasis    Rheumatic fever    Rosacea     Past Surgical History: Past Surgical History:  Procedure Laterality Date   ABDOMINAL HYSTERECTOMY     ANTERIOR AND POSTERIOR REPAIR N/A 12/04/2016   Procedure: ANTERIOR (CYSTOCELE) AND POSTERIOR REPAIR (RECTOCELE), TRANSOBTURATOR SLING PLACEMENT(ARIS);  Surgeon: Janit Alm Agent, MD;  Location: [facility] ORS;  Service: Gynecology;  Laterality: N/A;   COLONOSCOPY WITH PROPOFOL  N/A 05/15/2021   Procedure: COLONOSCOPY WITH PROPOFOL ;  Surgeon: Jinny Carmine, MD;  Location: [facility] ENDOSCOPY;  Service: Endoscopy;  Laterality: N/A;   DILATION AND CURETTAGE OF UTERUS N/A 09/16/2016   Procedure: DILATATION AND CURETTAGE;  Surgeon: Janit Alm Agent, MD;  Location: [facility] ORS;  Service: Gynecology;  Laterality: N/A;   LAPAROSCOPIC HYSTERECTOMY Bilateral 12/04/2016   Procedure: HYSTERECTOMY TOTAL LAPAROSCOPIC WITH BILATERAL SALPINGO OOPHERECTOMY;  Surgeon: Janit Alm Agent, MD;  Location: [facility] ORS;  Service: Gynecology;   Laterality: Bilateral;   MOUTH SURGERY      Allergies: Allergies as of 11/03/2023   (No Known Allergies)    Medications:  Current Outpatient Medications:    acetaminophen  (TYLENOL ) 650 MG CR tablet, Take 1,300 mg by mouth daily., Disp: , Rfl:    busPIRone  (BUSPAR ) 5 MG tablet, TAKE 1 TABLET EVERY DAY AS NEEDED, Disp: 90 tablet, Rfl: 1   cetirizine (ZYRTEC) 10 MG tablet, Take 10 mg by mouth at bedtime. , Disp: , Rfl:    Cholecalciferol 25 MCG (1000 UT) tablet, Take 1,000 Units by mouth daily., Disp: , Rfl:    doxycycline (ADOXA) 100 MG tablet, Take 50 mg by mouth daily., Disp: , Rfl:    ezetimibe  (ZETIA ) 10 MG tablet, Take 1 tablet (10 mg total) by mouth daily., Disp: 90 tablet, Rfl: 1   fluticasone  (FLONASE ) 50 MCG/ACT nasal spray, Place 2 sprays into both nostrils daily., Disp: 16 g, Rfl: 6   folic acid (FOLVITE) 1 MG tablet, Take 1mg  by mouth Sunday to Thursday and 2mg  on Friday and Saturday, Disp: , Rfl:    furosemide  (LASIX ) 40 MG tablet, Take 1 tablet (40 mg total) by mouth 2 (two) times daily., Disp: 180 tablet, Rfl: 1   gabapentin  (NEURONTIN ) 300 MG capsule, TAKE 2 CAPSULES THREE TIMES DAILY, Disp: 540 capsule, Rfl: 1   Golimumab (SIMPONI ARIA IV), Inject into the vein. Reports receives via infusion from Rheumatology clinic, Disp: , Rfl:    hydroxychloroquine (PLAQUENIL) 200 MG tablet, 1 tab  Orally twice a day for 90 days, Disp: , Rfl:    losartan  (COZAAR ) 50 MG tablet, Take 1 tablet (50 mg total) by mouth daily., Disp: 90 tablet, Rfl: 1   metFORMIN  (GLUCOPHAGE -XR) 500 MG 24 hr tablet, Take 1 tablet (500 mg total) by mouth 2 (two) times daily with a meal., Disp: 180 tablet, Rfl: 1   methotrexate (RHEUMATREX) 2.5 MG tablet, Take 25 mg by mouth every Friday. Caution:Chemotherapy. Protect from light., Disp: , Rfl:    Omega-3 Fatty Acids (FISH OIL PO), Take 1 capsule by mouth in the morning. 1040 mg of fish oil in each capsule, Disp: , Rfl:    omeprazole  (PRILOSEC) 40 MG capsule, Take  1 capsule (40 mg total) by mouth daily., Disp: 90 capsule, Rfl: 1   predniSONE  (DELTASONE ) 5 MG tablet, Take 5 mg by mouth daily., Disp: , Rfl:    rivaroxaban  (XARELTO ) 10 MG TABS tablet, Take 1 tablet (10 mg total) by mouth daily., Disp: 90 tablet, Rfl: 1   rosuvastatin  (CRESTOR ) 10 MG tablet, Take 1 tablet (10 mg total) by mouth daily., Disp: 90 tablet, Rfl: 1   Semaglutide  (RYBELSUS ) 14 MG TABS, Take 1 tablet (14 mg total) by mouth daily., Disp: 90 tablet, Rfl: 1   traMADol (ULTRAM) 50 MG tablet, Take 50 mg by mouth 2 (two) times daily., Disp: , Rfl:   Social History: Social History   Tobacco Use   Smoking status: Former    Current packs/day: 0.00    Average packs/day: 4.0 packs/day for 16.0 years (64.0 ttl pk-yrs)    Types: Cigarettes    Start date: 09/12/1979    Quit date: 09/12/1995    Years since quitting: 28.1   Smokeless tobacco: Never   Tobacco comments:    quit in 1997  Vaping Use   Vaping status: Never Used  Substance Use Topics   Alcohol use: No    Alcohol/week: 0.0 standard drinks of alcohol   Drug use: No    Family Medical History: Family History  Problem Relation Age of Onset   Cancer Mother        skin   Dementia Mother    Cancer Sister        skin and liver   Stroke Maternal Grandmother    Skin cancer Brother    Diabetes Neg Hx    Heart disease Neg Hx    Breast cancer Neg Hx     Physical Examination: Vitals:   11/03/23 1005  BP: 136/70    General: Patient is in no apparent distress. Attention to examination is appropriate.  Neck:   Supple.  Full range of motion.  Respiratory: Patient is breathing without any difficulty.   NEUROLOGICAL:   Strength: No major deficits noted.   Reflexes are mostly 2+ throughout, possible Hoffmann sign possible spreading at her brachial radialis.   Imaging: Narrative & Impression  CLINICAL DATA:  Bilateral leg pain.   EXAM: MRI LUMBAR SPINE WITHOUT CONTRAST   TECHNIQUE: Multiplanar, multisequence MR  imaging of the lumbar spine was performed. No intravenous contrast was administered.   COMPARISON:  Lumbar MRI 04/23/2018.   FINDINGS: FINDINGS For the purposes of this dictation, the L5-S1 disc space is on image 27, series 21.   Grade 1 anterolisthesis of L4 on L5 has increased and now measures roughly 6.5 mm (previously this measured roughly 3 mm).   No significant vertebral body height loss.   Multilevel disc space narrowing has increased and is now severe at L5-S1.  There is disc desiccation to varying degrees at every level   Bone marrow endplate edematous change is noted at L5-S1.   Conus ends at the upper L1 level.   Mild degenerative change of the partially imaged sacroiliac joints bilaterally.   T12-L1 : Small broad-based disc bulge causes no significant central canal narrowing. No significant neural foraminal narrowing. Mild bilateral facet degenerative change.   L1-2: No significant central canal narrowing. No significant neural foraminal narrowing. Moderate bilateral facet degenerative change.   L2-3: Mild-moderate central canal stenosis from broad-based disc bulge is new. Moderate bilateral neural foramen narrowing is new. Insert effusions   L3-4: Small broad-based disc bulge causes no significant central canal narrowing. Mild right neural foramen narrowing is new. No significant left neural foraminal narrowing. Mild bilateral facet degenerative change.   L4-5: Severe central canal stenosis from unroofing of disc material is increased there is superimposed severe left subarticular recess narrowing from left paracentral disc extrusion which is new. Severe right and moderate left neural foramen narrowing has increased. Severe bilateral facet degenerative change.   L5-S1: No significant central canal narrowing. Mild right and moderate left neural foramen narrowing is unchanged significantly. Mild bilateral facet degenerative change.    IMPRESSION: IMPRESSION *Multilevel degenerative change as increased a few levels and is most notable at L4-5 where there is severe central canal and left subarticular recess narrowing. *Please see above for more details.     Electronically Signed   By: Bryon  Nastasi M.D.   On: 08/02/2023 10:13   Narrative & Impression  CLINICAL DATA:  Right lateral neck pain intermittent x2 years.   EXAM: MRI CERVICAL SPINE WITHOUT CONTRAST   TECHNIQUE: Multiplanar, multisequence MR imaging of the cervical spine was performed. No intravenous contrast was administered.   COMPARISON:  None Available.   FINDINGS: Straightening of the normal cervical lordosis with mild reversal could relate to muscle spasm or could be positional.   No prevertebral soft tissue swelling.   No significant listhesis.   No significant vertebral body height loss.   Multilevel moderate disc space narrowing from C3-4 through C6-7 and also at T1-2.   Cervical spinal cord is normal in signal intensity.   Bone marrow signal is mildly heterogeneous.   Imaging to the visualized portions of the brain parenchyma reveals no acute finding.   C2-3: No significant central canal narrowing. No significant neural foraminal narrowing.   C3-4: Moderate central canal stenosis from disc osteophyte complex causes some flattening of the cervical spinal cord. Moderate right and mild left neural foraminal narrowing from disc osteophyte and uncovertebral hypertrophy.   C4-5: Moderate central canal stenosis from disc osteophyte complex causes some flattening of the cervical spinal cord. Moderate bilateral neural foramen narrowing from disc osteophyte and uncovertebral hypertrophy.   C5-6: Moderate central canal stenosis from disc osteophyte complex causes some flattening of the cervical spinal cord. Mild right and moderate left neural foraminal narrowing from disc osteophyte and uncovertebral hypertrophy.   C6-7: Mild  central canal stenosis from disc osteophyte complex. Mild bilateral neural foraminal narrowing from disc osteophyte and uncovertebral hypertrophy.   C7-T1: No significant central canal narrowing. No significant neural foraminal narrowing.   IMPRESSION: IMPRESSION *Multilevel degenerative change is most notable at C3-4, C4-5, and C5-6 where there is moderate central canal and neural foraminal narrowing. *Please see above for more details.     Electronically Signed   By: Bryon  Nastasi M.D.   On: 08/02/2023 10:06      I have personally reviewed  the images and agree with the above interpretation.  Medical Decision Making/Assessment and Plan: Melissa Cabrera is a pleasant 68 y.o. female with spondylolisthesis at L4-5.  We have been following this.  She had recent EMGs which were negative for any polyradiculopathy or lumbar radiculopathy.  Her upper extremity showed carpal tunnel syndrome.  As we discussed before her bandlike back pain is likely due to the spondylolisthesis, she does have some radiating pain that was not picked up on her EMG, however this is not uncommon since her pain is not persistent.  On her MRI it shows a slip which is likely causing some foraminal stenosis and compression of her nerve roots.  This is likely worsened when she is upright.  We discussed that she would likely benefit from a lumbar spinal fusion, however we would need her to work on her weight optimization for improving outcomes and decreasing perioperative and postoperative risks.  I discussed that I will reach out to her primary care provider about her weight loss medication as she has not lost a significant amount of weight.  I did discuss with her the utility of nutrition consult as I can help us  find places in our diet and daily intake for which we might be unaware.  She states that at this time she is not interested in a nutrition consult.  I did discuss that some patients require weight loss procedures  such as gastric bypass to lose weight prior to surgical intervention.  I would like to continue to follow-up with her, if she continues to move her BMI closer to 40 or less then we can discuss surgical intervention as this would help her with improving her chances for good outcomes.  Thank you for involving me in the care of this patient.    Penne MICAEL Sharps MD/MSCR Neurosurgery   I spent a total of 30 minutes with her today discussing her care, reviewing her charts, reviewing her recent EMG nerve conduction studies, counseling her on her current issues, and coordinating her care going forward.  I like to see her back in approximately 3 months.

## 2023-11-21 LAB — HM DIABETES EYE EXAM

## 2023-12-01 ENCOUNTER — Other Ambulatory Visit: Payer: Self-pay | Admitting: Internal Medicine

## 2023-12-02 NOTE — Telephone Encounter (Signed)
 Requested Prescriptions  Pending Prescriptions Disp Refills   busPIRone  (BUSPAR ) 5 MG tablet [Pharmacy Med Name: BUSPIRONE  HCL TABS 5MG ] 90 tablet 1    Sig: TAKE 1 TABLET DAILY AS NEEDED     Psychiatry: Anxiolytics/Hypnotics - Non-controlled Passed - 12/02/2023  5:29 PM      Passed - Valid encounter within last 12 months    Recent Outpatient Visits           5 months ago Type 2 diabetes mellitus without complication, without long-term current use of insulin (HCC)   [place] [facility] [place] [place], Angeline ORN, NP       Future Appointments             In 2 weeks Anton[place], Angeline ORN, NP [place] [facility]             ezetimibe  (ZETIA ) 10 MG tablet [Pharmacy Med Name: EZETIMIBE  TABS 10MG ] 90 tablet 1    Sig: TAKE 1 TABLET DAILY     Cardiovascular:  Antilipid - Sterol Transport Inhibitors Failed - 12/02/2023  5:29 PM      Failed - Lipid Panel in normal range within the last 12 months    Cholesterol, Total  Date Value Ref Range Status  06/07/2022 144 100 - 199 mg/dL Final   Cholesterol  Date Value Ref Range Status  06/18/2023 120 <200 mg/dL Final   LDL Cholesterol (Calc)  Date Value Ref Range Status  06/18/2023 24 mg/dL (calc) Final    Comment:    Reference range: <100 . Desirable range <100 mg/dL for primary prevention;   <70 mg/dL for patients with CHD or diabetic patients  with > or = 2 CHD risk factors. SABRA LDL-C is now calculated using the Martin-Hopkins  calculation, which is a validated novel method providing  better accuracy than the Friedewald equation in the  estimation of LDL-C.  Gladis APPLETHWAITE et al. SANDREA. 7986;689(80): 2061-2068  (http://education.[facility].com/faq/FAQ164)    Direct LDL  Date Value Ref Range Status  12/16/2022 26 <100 mg/dL Final    Comment:    Greatly elevated Triglycerides values (>1200 mg/dL) interfere with the dLDL assay. SABRA Desirable range <100 mg/dL for primary prevention;   <70 mg/dL for  patients with CHD or diabetic patients  with > or = 2 CHD risk factors. SABRA    HDL  Date Value Ref Range Status  06/18/2023 74 > OR = 50 mg/dL Final  98/73/7975 58 >60 mg/dL Final   Triglycerides  Date Value Ref Range Status  06/18/2023 135 <150 mg/dL Final         Passed - AST in normal range and within 360 days    AST  Date Value Ref Range Status  06/18/2023 22 10 - 35 U/L Final         Passed - ALT in normal range and within 360 days    ALT  Date Value Ref Range Status  06/18/2023 21 6 - 29 U/L Final         Passed - Patient is not pregnant      Passed - Valid encounter within last 12 months    Recent Outpatient Visits           5 months ago Type 2 diabetes mellitus without complication, without long-term current use of insulin [facility])   [place] [facility] [place], Angeline ORN, NP       Future Appointments  In 2 weeks [place], Angeline ORN, NP [place] [facility], [facility]             losartan  (COZAAR ) 50 MG tablet [Pharmacy Med Name: LOSARTAN  TABS 50MG ] 90 tablet 0    Sig: TAKE 1 TABLET DAILY     Cardiovascular:  Angiotensin Receptor Blockers Passed - 12/02/2023  5:29 PM      Passed - Cr in normal range and within 180 days    Creat  Date Value Ref Range Status  06/18/2023 0.87 0.50 - 1.05 mg/dL Final   Creatinine, Urine  Date Value Ref Range Status  12/16/2022 34 20 - 275 mg/dL Final         Passed - K in normal range and within 180 days    Potassium  Date Value Ref Range Status  06/18/2023 4.2 3.5 - 5.3 mmol/L Final         Passed - Patient is not pregnant      Passed - Last BP in normal range    BP Readings from Last 1 Encounters:  11/03/23 136/70         Passed - Valid encounter within last 6 months    Recent Outpatient Visits           5 months ago Type 2 diabetes mellitus without complication, without long-term current use of insulin (HCC)   [place] [facility] [place], Angeline ORN, NP       Future Appointments             In 2 weeks [place], Angeline ORN, NP [place] [facility], [facility]             omeprazole  (PRILOSEC) 40 MG capsule [Pharmacy Med Name: OMEPRAZOLE  DR CAPS 40MG ] 90 capsule 1    Sig: TAKE 1 CAPSULE DAILY     Gastroenterology: Proton Pump Inhibitors Passed - 12/02/2023  5:29 PM      Passed - Valid encounter within last 12 months    Recent Outpatient Visits           5 months ago Type 2 diabetes mellitus without complication, without long-term current use of insulin (HCC)   [place] [facility] [place], Angeline ORN, NP       Future Appointments             In 2 weeks [place], Angeline ORN, NP [facility], [facility]             rivaroxaban  (XARELTO ) 10 MG TABS tablet [Pharmacy Med Name: XARELTO  TABS 10MG ] 90 tablet 1    Sig: TAKE 1 TABLET DAILY     Hematology: Anticoagulants - rivaroxaban  Passed - 12/02/2023  5:29 PM      Passed - ALT in normal range and within 360 days    ALT  Date Value Ref Range Status  06/18/2023 21 6 - 29 U/L Final         Passed - AST in normal range and within 360 days    AST  Date Value Ref Range Status  06/18/2023 22 10 - 35 U/L Final         Passed - Cr in normal range and within 360 days    Creat  Date Value Ref Range Status  06/18/2023 0.87 0.50 - 1.05 mg/dL Final   Creatinine, Urine  Date Value Ref Range Status  12/16/2022 34 20 - 275 mg/dL Final  Passed - HCT in normal range and within 360 days    HCT  Date Value Ref Range Status  06/18/2023 40.5 35.0 - 45.0 % Final   Hematocrit  Date Value Ref Range Status  03/07/2022 38.0 34.0 - 46.6 % Final         Passed - HGB in normal range and within 360 days    Hemoglobin  Date Value Ref Range Status  06/18/2023 13.0 11.7 - 15.5 g/dL Final  89/73/7976 87.2 11.1 - 15.9 g/dL Final         Passed - PLT in normal range and within 360 days    Platelets  Date Value Ref Range Status   06/18/2023 295 140 - 400 Thousand/uL Final  03/07/2022 298 150 - 450 x10E3/uL Final         Passed - eGFR is 15 or above and within 360 days    GFR calc Af Amer  Date Value Ref Range Status  04/30/2019 >60 >60 mL/min Final   GFR calc non Af Amer  Date Value Ref Range Status  04/30/2019 >60 >60 mL/min Final   GFR  Date Value Ref Range Status  12/21/2019 53.40 (L) >60.00 mL/min Final   eGFR  Date Value Ref Range Status  06/18/2023 73 > OR = 60 mL/min/1.60m2 Final  06/07/2022 65 >59 mL/min/1.73 Final         Passed - Patient is not pregnant      Passed - Valid encounter within last 12 months    Recent Outpatient Visits           5 months ago Type 2 diabetes mellitus without complication, without long-term current use of insulin (HCC)   [facility] [place], Angeline ORN, NP       Future Appointments             In 2 weeks Antonette, Angeline ORN, NP  [facility], [facility]             rosuvastatin  (CRESTOR ) 10 MG tablet [Pharmacy Med Name: ROSUVASTATIN  TABS 10MG ] 90 tablet 1    Sig: TAKE 1 TABLET DAILY     Cardiovascular:  Antilipid - Statins 2 Failed - 12/02/2023  5:29 PM      Failed - Lipid Panel in normal range within the last 12 months    Cholesterol, Total  Date Value Ref Range Status  06/07/2022 144 100 - 199 mg/dL Final   Cholesterol  Date Value Ref Range Status  06/18/2023 120 <200 mg/dL Final   LDL Cholesterol (Calc)  Date Value Ref Range Status  06/18/2023 24 mg/dL (calc) Final    Comment:    Reference range: <100 . Desirable range <100 mg/dL for primary prevention;   <70 mg/dL for patients with CHD or diabetic patients  with > or = 2 CHD risk factors. SABRA LDL-C is now calculated using the Martin-Hopkins  calculation, which is a validated novel method providing  better accuracy than the Friedewald equation in the  estimation of LDL-C.  Gladis APPLETHWAITE et al. SANDREA. 7986;689(80): 2061-2068   (http://education.[facility].com/faq/FAQ164)    Direct LDL  Date Value Ref Range Status  12/16/2022 26 <100 mg/dL Final    Comment:    Greatly elevated Triglycerides values (>1200 mg/dL) interfere with the dLDL assay. SABRA Desirable range <100 mg/dL for primary prevention;   <70 mg/dL for patients with CHD or diabetic patients  with > or = 2 CHD risk factors. SABRA  HDL  Date Value Ref Range Status  06/18/2023 74 > OR = 50 mg/dL Final  98/73/7975 58 >60 mg/dL Final   Triglycerides  Date Value Ref Range Status  06/18/2023 135 <150 mg/dL Final         Passed - Cr in normal range and within 360 days    Creat  Date Value Ref Range Status  06/18/2023 0.87 0.50 - 1.05 mg/dL Final   Creatinine, Urine  Date Value Ref Range Status  12/16/2022 34 20 - 275 mg/dL Final         Passed - Patient is not pregnant      Passed - Valid encounter within last 12 months    Recent Outpatient Visits           5 months ago Type 2 diabetes mellitus without complication, without long-term current use of insulin (HCC)   [place] [facility] [place], Angeline ORN, NP       Future Appointments             In 2 weeks Antonette, Angeline ORN, NP [place] [facility]

## 2023-12-08 ENCOUNTER — Encounter: Payer: Self-pay | Admitting: Internal Medicine

## 2023-12-09 MED ORDER — VALACYCLOVIR HCL 500 MG PO TABS: ORAL_TABLET | ORAL | 1 refills | Status: DC

## 2023-12-11 ENCOUNTER — Other Ambulatory Visit: Payer: Self-pay | Admitting: Internal Medicine

## 2023-12-12 NOTE — Telephone Encounter (Signed)
 Requested Prescriptions  Pending Prescriptions Disp Refills   gabapentin  (NEURONTIN ) 300 MG capsule [Pharmacy Med Name: GABAPENTIN  CAPS 300MG ] 540 capsule 0    Sig: TAKE 2 CAPSULES THREE TIMES A DAY     Neurology: Anticonvulsants - gabapentin  Passed - 12/12/2023  1:01 PM      Passed - Cr in normal range and within 360 days    Creat  Date Value Ref Range Status  06/18/2023 0.87 0.50 - 1.05 mg/dL Final   Creatinine, Urine  Date Value Ref Range Status  12/16/2022 34 20 - 275 mg/dL Final         Passed - Completed PHQ-2 or PHQ-9 in the last 360 days      Passed - Valid encounter within last 12 months    Recent Outpatient Visits           5 months ago Type 2 diabetes mellitus without complication, without long-term current use of insulin (HCC)   [facility] May, Angeline ORN, NP       Future Appointments             In 4 days Hendricks, Angeline ORN, NP [facility], [facility]

## 2023-12-16 ENCOUNTER — Encounter: Payer: Self-pay | Admitting: Internal Medicine

## 2023-12-16 ENCOUNTER — Ambulatory Visit (INDEPENDENT_AMBULATORY_CARE_PROVIDER_SITE_OTHER): Payer: Medicare Other | Admitting: Internal Medicine

## 2023-12-16 VITALS — BP 130/80 | HR 76 | Ht 60.0 in | Wt 241.5 lb

## 2023-12-16 DIAGNOSIS — I5032 Chronic diastolic (congestive) heart failure: Secondary | ICD-10-CM | POA: Diagnosis not present

## 2023-12-16 DIAGNOSIS — E119 Type 2 diabetes mellitus without complications: Secondary | ICD-10-CM | POA: Diagnosis not present

## 2023-12-16 DIAGNOSIS — Z6841 Body Mass Index (BMI) 40.0 and over, adult: Secondary | ICD-10-CM

## 2023-12-16 DIAGNOSIS — Z7984 Long term (current) use of oral hypoglycemic drugs: Secondary | ICD-10-CM

## 2023-12-16 DIAGNOSIS — N3946 Mixed incontinence: Secondary | ICD-10-CM | POA: Insufficient documentation

## 2023-12-16 DIAGNOSIS — I10 Essential (primary) hypertension: Secondary | ICD-10-CM | POA: Diagnosis not present

## 2023-12-16 DIAGNOSIS — L719 Rosacea, unspecified: Secondary | ICD-10-CM

## 2023-12-16 DIAGNOSIS — M150 Primary generalized (osteo)arthritis: Secondary | ICD-10-CM

## 2023-12-16 DIAGNOSIS — D692 Other nonthrombocytopenic purpura: Secondary | ICD-10-CM

## 2023-12-16 DIAGNOSIS — F411 Generalized anxiety disorder: Secondary | ICD-10-CM

## 2023-12-16 DIAGNOSIS — E785 Hyperlipidemia, unspecified: Secondary | ICD-10-CM

## 2023-12-16 DIAGNOSIS — G5603 Carpal tunnel syndrome, bilateral upper limbs: Secondary | ICD-10-CM

## 2023-12-16 DIAGNOSIS — G5600 Carpal tunnel syndrome, unspecified upper limb: Secondary | ICD-10-CM | POA: Insufficient documentation

## 2023-12-16 DIAGNOSIS — E1143 Type 2 diabetes mellitus with diabetic autonomic (poly)neuropathy: Secondary | ICD-10-CM

## 2023-12-16 DIAGNOSIS — E1169 Type 2 diabetes mellitus with other specified complication: Secondary | ICD-10-CM

## 2023-12-16 DIAGNOSIS — M48061 Spinal stenosis, lumbar region without neurogenic claudication: Secondary | ICD-10-CM

## 2023-12-16 DIAGNOSIS — K219 Gastro-esophageal reflux disease without esophagitis: Secondary | ICD-10-CM

## 2023-12-16 DIAGNOSIS — L4050 Arthropathic psoriasis, unspecified: Secondary | ICD-10-CM

## 2023-12-16 DIAGNOSIS — Z8542 Personal history of malignant neoplasm of other parts of uterus: Secondary | ICD-10-CM

## 2023-12-16 DIAGNOSIS — Z86718 Personal history of other venous thrombosis and embolism: Secondary | ICD-10-CM

## 2023-12-16 NOTE — Assessment & Plan Note (Signed)
 A1c today and urine microalbumin today Continue metformin  XR 500 mg twice daily, rybelsus  14 mg daily and gabapentin  300 mg 3 times daily Encourage low-carb diet and exercise for weight loss Encouraged routine eye exam Encouraged routine foot exam She declines immunizations today

## 2023-12-16 NOTE — Assessment & Plan Note (Signed)
 Reinforced DASH diet, advised her to monitor daily weights Continue losartan  50 mg daily and furosemide  40 mg bid C-Met today

## 2023-12-16 NOTE — Progress Notes (Signed)
 Subjective:    Patient ID: Melissa Cabrera, female    DOB: 09-19-1955, 68 y.o.   MRN: [id number]  HPI  Patient presents to clinic today for 61-month follow-up of chronic conditions.  CHF, diastolic: She reports some lower extremity edema and shortness of breath but denies chronic cough.  She is taking losartan  and furosemide  as prescribed.  Echo from 06/2023 reviewed.  She does not follow with cardiology.  HTN: Her BP today is 130/80.  She is taking losartan  and furosemide  as prescribed.  ECG from 11/2018 reviewed.  HLD: Her last LDL was 24, triglyceride 135, 06/2023.  She denies myalgias on rosuvastatin , ezetimibe  and [name].  She does not consume a low-fat diet.  DM2 with peripheral neuropathy: Her last A1c was 6.2%, 06/2023.  She is taking metformin , rybelsus  and gabapentin  as prescribed.  She does not check her sugars.  She checks her feet routinely.  Her last eye exam was 11/2023.  Flu never.  Pneumovax never.  Prevnar never.  COVID never.  Anxiety: Chronic, managed on buspirone  as needed.  She is not currently seeing a therapist.  She denies depression, SI/HI.  Lumbar spinal stenosis/OA/psoriatic arthritis: Mainly in her low back, knees and feet.  MRI cervical and lumbar spine from 06/2023 reviewed. She reports recently had gel injections in her knees but it has not helped.  Managed with gabapentin , methotrexate, plaquenil , simponi, tramadol and prednisone . She follows with rheumatology, orthopedics and neurosurgery.  History of DVT: Managed with lifelong xarelto . She no longer follows with hematology.  History of endometrial cancer: In remission s/p hysterectomy. She no longer follows with oncology.  GERD: Triggered by certain medications.  She denies breakthrough on omeprazole .  There is no upper GI on file.  Rosacea: Managed with doxycycline. She follows with dermatology.  Mixed incontinence: She does wear pads. She is not currently taking any medications for this.  She does not  follow with urology.  Carpal tunnel syndrome: She reports she had EMG's done by neurology. The right is worse than her left. She is not wearing splints. She follows with neurology.   Review of Systems     Past Medical History:  Diagnosis Date   Allergy    Arthritis    Cancer (HCC)    endometrial   Chicken pox    Diabetes mellitus without complication (HCC)    GERD (gastroesophageal reflux disease)    Heart murmur    DUE TO RHEUMATIC FEVER PER PT   Hyperlipidemia    Hypertension    Psoriasis    Rheumatic fever    Rosacea     Current Outpatient Medications  Medication Sig Dispense Refill   acetaminophen  (TYLENOL ) 650 MG CR tablet Take 1,300 mg by mouth daily.     busPIRone  (BUSPAR ) 5 MG tablet TAKE 1 TABLET DAILY AS NEEDED 90 tablet 1   cetirizine (ZYRTEC) 10 MG tablet Take 10 mg by mouth at bedtime.      Cholecalciferol 25 MCG (1000 UT) tablet Take 1,000 Units by mouth daily.     doxycycline (ADOXA) 100 MG tablet Take 50 mg by mouth daily.     ezetimibe  (ZETIA ) 10 MG tablet TAKE 1 TABLET DAILY 90 tablet 1   fluticasone  (FLONASE ) 50 MCG/ACT nasal spray Place 2 sprays into both nostrils daily. 16 g 6   folic acid (FOLVITE) 1 MG tablet Take 1mg  by mouth Sunday to Thursday and 2mg  on Friday and Saturday     furosemide  (LASIX ) 40 MG tablet Take 1  tablet (40 mg total) by mouth 2 (two) times daily. 180 tablet 1   gabapentin  (NEURONTIN ) 300 MG capsule TAKE 2 CAPSULES THREE TIMES A DAY 540 capsule 0   Golimumab (SIMPONI ARIA IV) Inject into the vein. Reports receives via infusion from Rheumatology clinic     hydroxychloroquine (PLAQUENIL) 200 MG tablet 1 tab Orally twice a day for 90 days     losartan  (COZAAR ) 50 MG tablet TAKE 1 TABLET DAILY 90 tablet 0   metFORMIN  (GLUCOPHAGE -XR) 500 MG 24 hr tablet Take 1 tablet (500 mg total) by mouth 2 (two) times daily with a meal. 180 tablet 1   methotrexate (RHEUMATREX) 2.5 MG tablet Take 25 mg by mouth every Friday. Caution:Chemotherapy.  Protect from light.     Omega-3 Fatty Acids ([name]) Take 1 capsule by mouth in the morning. 1040 mg of [name] in each capsule     omeprazole  (PRILOSEC) 40 MG capsule TAKE 1 CAPSULE DAILY 90 capsule 1   predniSONE  (DELTASONE ) 5 MG tablet Take 5 mg by mouth daily.     rivaroxaban  (XARELTO ) 10 MG TABS tablet TAKE 1 TABLET DAILY 90 tablet 1   rosuvastatin  (CRESTOR ) 10 MG tablet TAKE 1 TABLET DAILY 90 tablet 1   Semaglutide  (RYBELSUS ) 14 MG TABS Take 1 tablet (14 mg total) by mouth daily. 90 tablet 1   traMADol (ULTRAM) 50 MG tablet Take 50 mg by mouth 2 (two) times daily.     valACYclovir  (VALTREX ) 500 MG tablet 1 tab po daily. if flare, increase to 4 tabs at onset of symptoms and then 4 tabs 12 hours later. Then return to 1 tab po daily. 90 tablet 1   No current facility-administered medications for this visit.    No Known Allergies  Family History  Problem Relation Age of Onset   Cancer Mother        skin   Dementia Mother    Cancer Sister        skin and liver   Stroke Maternal Grandmother    Skin cancer Brother    Diabetes Neg Hx    Heart disease Neg Hx    Breast cancer Neg Hx     Social History   Socioeconomic History   Marital status: Married    Spouse name: Not on file   Number of children: Not on file   Years of education: Not on file   Highest education level: 12th grade  Occupational History   Not on file  Tobacco Use   Smoking status: Former    Current packs/day: 0.00    Average packs/day: 4.0 packs/day for 16.0 years (64.0 ttl pk-yrs)    Types: Cigarettes    Start date: 09/12/1979    Quit date: 09/12/1995    Years since quitting: 28.2   Smokeless tobacco: Never   Tobacco comments:    quit in 1997  Vaping Use   Vaping status: Never Used  Substance and Sexual Activity   Alcohol use: No    Alcohol/week: 0.0 standard drinks of alcohol   Drug use: No   Sexual activity: Yes    Birth control/protection: None  Other Topics Concern   Not on file  Social  History Narrative   Not on file   Social Drivers of Health   Financial Resource Strain: Low Risk  (12/15/2023)   Overall Financial Resource Strain (CARDIA)    Difficulty of Paying Living Expenses: Not very hard  Food Insecurity: Food Insecurity Present (12/15/2023)   Hunger  Vital Sign    Worried About Programme researcher, broadcasting/film/video in the Last Year: Sometimes true    Ran Out of Food in the Last Year: Never true  Transportation Needs: No Transportation Needs (12/15/2023)   PRAPARE - Administrator, Civil Service (Medical): No    Lack of Transportation (Non-Medical): No  Physical Activity: Inactive (12/15/2023)   Exercise Vital Sign    Days of Exercise per Week: 0 days    Minutes of Exercise per Session: Not on file  Stress: No Stress Concern Present (12/15/2023)   [employer] of Occupational Health - Occupational Stress Questionnaire    Feeling of Stress: Not at all  Social Connections: Moderately Isolated (12/15/2023)   Social Connection and Isolation Panel    Frequency of Communication with Friends and Family: Never    Frequency of Social Gatherings with Friends and Family: Never    Attends Religious Services: More than 4 times per year    Active Member of [employer] or Organizations: No    Attends Engineer, structural: Not on file    Marital Status: Married  Catering manager Violence: Not At Risk (04/18/2023)   Humiliation, Afraid, Rape, and Kick questionnaire    Fear of Current or Ex-Partner: No    Emotionally Abused: No    Physically Abused: No    Sexually Abused: No     Constitutional:  Denies fever, malaise, fatigue, headache or abnormal weight changes.  HEENT:  Denies eye pain, eye redness, ear pain, ringing in the ears, wax buildup, runny nose, nasal congestion, bloody nose, or sore throat. Respiratory: Patient reports intermittent shortness of breath.  Denies difficulty breathing, cough or sputum production.   Cardiovascular: Patient reports swelling in legs.  Denies  chest pain, chest tightness, palpitations or swelling in the hands.  Gastrointestinal: Denies abdominal pain, bloating, constipation, diarrhea or blood in the stool.  GU: Pt reports mixed incontinence. Denies pain with urination, burning sensation, blood in urine, odor or discharge. Musculoskeletal: Pt reports low back pain, bilateral knee pain, bilateral foot pain, difficulty with gait. Denies decrease in range of motion, muscle pain or joint swelling.  Skin: Denies redness, rashes, lesions or ulcercations.  Neurological: Patient reports neuropathic pain of BUE/BLE.  Denies dizziness, difficulty with memory, difficulty with speech or problems with balance and coordination.  Psych: Patient has a history of anxiety.  Denies anxiety, depression, SI/HI.  No other specific complaints in a complete review of systems (except as listed in HPI above).  Objective:   Physical Exam  BP 130/80 (BP Location: Left Arm, Patient Position: Sitting, Cuff Size: Large)   Pulse 76   Ht 5' (1.524 m)   Wt 241 lb 8 oz (109.5 kg)   SpO2 100%   BMI 47.16 kg/m     Wt Readings from Last 3 Encounters:  11/03/23 255 lb (115.7 kg)  08/20/23 251 lb (113.9 kg)  08/07/23 251 lb (113.9 kg)    General: Appears her stated age, obese, in NAD. Skin: Warm, dry and intact.  Senile purpura noted of BUE.  No ulcerations noted. HEENT: Head: normal shape and size; Eyes: sclera white, no icterus, conjunctiva pink, PERRLA and EOMs intact; Nose: Ulceration noted in the left nare. Neck:  Neck supple, trachea midline. No thyromegaly or thyroid  nodule present. Cardiovascular: Normal rate and rhythm. S1,S2 noted.  No murmur, rubs or gallops noted. No JVD. 1 + BLE edema. No carotid bruits noted. Pulmonary/Chest: Normal effort and positive vesicular breath sounds. No respiratory distress.  No wheezes, rales or ronchi noted.  Abdomen: Normal bowel sounds.  Musculoskeletal: She has difficulty getting from a sitting to a standing  position.  Gait slow and steady without device. Neurological: Alert and oriented. Coordination normal.  Psychiatric: Mood and affect normal. Behavior is normal. Judgment and thought content normal.     BMET    Component Value Date/Time   NA 142 06/18/2023 0952   NA 144 06/07/2022 0927   K 4.2 06/18/2023 0952   CL 105 06/18/2023 0952   CO2 29 06/18/2023 0952   GLUCOSE 128 (H) 06/18/2023 0952   BUN 14 06/18/2023 0952   BUN 15 06/07/2022 0927   CREATININE 0.87 06/18/2023 0952   CALCIUM  9.4 06/18/2023 0952   GFRNONAA >60 04/30/2019 1203   GFRAA >60 04/30/2019 1203    Lipid Panel     Component Value Date/Time   CHOL 120 06/18/2023 0952   CHOL 144 06/07/2022 0927   TRIG 135 06/18/2023 0952   HDL 74 06/18/2023 0952   HDL 58 06/07/2022 0927   CHOLHDL 1.6 06/18/2023 0952   VLDL 39.0 12/21/2019 1149   LDLCALC 24 06/18/2023 0952    CBC    Component Value Date/Time   WBC 4.9 06/18/2023 0952   RBC 4.14 06/18/2023 0952   HGB 13.0 06/18/2023 0952   HGB 12.7 03/07/2022 0912   HCT 40.5 06/18/2023 0952   HCT 38.0 03/07/2022 0912   PLT 295 06/18/2023 0952   PLT 298 03/07/2022 0912   MCV 97.8 06/18/2023 0952   MCV 92 03/07/2022 0912   MCH 31.4 06/18/2023 0952   MCHC 32.1 06/18/2023 0952   RDW 14.2 06/18/2023 0952   RDW 14.3 03/07/2022 0912   LYMPHSABS 2.1 04/30/2019 1203   MONOABS 0.6 04/30/2019 1203   EOSABS 0.2 04/30/2019 1203   BASOSABS 0.0 04/30/2019 1203    Hgb A1C Lab Results  Component Value Date   HGBA1C 6.2 (H) 06/18/2023           Assessment & Plan:     RTC in 6 months for follow-up of chronic conditions Angeline Laura, NP

## 2023-12-16 NOTE — Assessment & Plan Note (Signed)
 Continue gabapentin  300 mg 3 times daily and tramadol 50 mg twice daily. Encouraged regular stretching Encouraged weight loss as this can help reduce joint pain

## 2023-12-16 NOTE — Patient Instructions (Signed)

## 2023-12-16 NOTE — Assessment & Plan Note (Signed)
 CBC today Advised her to stop fish oil OTC as this has blood thinning properties

## 2023-12-16 NOTE — Assessment & Plan Note (Signed)
 Continue gabapentin  300 mg 3 times daily Encourage use of wrist splints She is not interested in surgical intervention at this time

## 2023-12-16 NOTE — Assessment & Plan Note (Signed)
 Controlled on losartan  50 mg and furosemide  40 mg twice daily Reinforced DASH diet and exercise for weight loss C-Met today

## 2023-12-16 NOTE — Assessment & Plan Note (Signed)
 Continue with gabapentin  300 mg 3 times daily She will continue to follow with neurosurgery

## 2023-12-16 NOTE — Assessment & Plan Note (Signed)
 Encouraged diet and exercise for weight loss ?

## 2023-12-16 NOTE — Assessment & Plan Note (Signed)
 Avoid foods that trigger reflux Encourage weight loss as this can help reduce reflux symptoms Continue omeprazole 40 mg daily

## 2023-12-16 NOTE — Assessment & Plan Note (Signed)
 Continue doxycycline 100 mg daily per dermatology

## 2023-12-16 NOTE — Assessment & Plan Note (Signed)
 Will need lifelong anticoagualtion Continue xarelto  10 mg daily

## 2023-12-16 NOTE — Assessment & Plan Note (Signed)
 C-Met and lipid profile today Encouraged her to consume a low-fat diet Continue rosuvastatin  10 mg, ezetimibe  10 mg daily  Advised her to stop fish oil OTC

## 2023-12-16 NOTE — Assessment & Plan Note (Addendum)
 Continue buspirone  5 mg daily as needed Support offered

## 2023-12-16 NOTE — Assessment & Plan Note (Signed)
 Encouraged weight loss as this can reduce symptoms Encouraged core strengthening and Kegel exercises She will continue to wear pads

## 2023-12-16 NOTE — Assessment & Plan Note (Signed)
In remission s/p hysterectomy No longer following with oncology

## 2023-12-16 NOTE — Assessment & Plan Note (Signed)
 Continue with gabapentin  300 mg 3 times daily, methotrexate 2.5 mg weekly, try 50 mg twice daily, simponi infusions and prednisone  5 mg daily Did encourage her to try to get off the prednisone  if at all possible She will continue to follow with rheumatology

## 2023-12-16 NOTE — Assessment & Plan Note (Signed)
 Continue gabapentin  300 mg 3 times daily as previous prescribed

## 2023-12-17 ENCOUNTER — Ambulatory Visit: Payer: Self-pay | Admitting: Internal Medicine

## 2023-12-17 LAB — CBC
HCT: 40.7 % (ref 35.0–45.0)
Hemoglobin: 13.2 g/dL (ref 11.7–15.5)
MCH: 32.5 pg (ref 27.0–33.0)
MCHC: 32.4 g/dL (ref 32.0–36.0)
MCV: 100.2 fL — ABNORMAL HIGH (ref 80.0–100.0)
MPV: 9.3 fL (ref 7.5–12.5)
Platelets: 245 Thousand/uL (ref 140–400)
RBC: 4.06 Million/uL (ref 3.80–5.10)
RDW: 13.7 % (ref 11.0–15.0)
WBC: 5 Thousand/uL (ref 3.8–10.8)

## 2023-12-17 LAB — LIPID PANEL
Cholesterol: 95 mg/dL (ref <200)
HDL: 64 mg/dL (ref >=50)
LDL Cholesterol (Calc): 13 mg/dL
Non-HDL Cholesterol (Calc): 31 mg/dL (ref <130)
Total CHOL/HDL Ratio: 1.5 (calc) (ref <5.0)
Triglycerides: 99 mg/dL (ref <150)

## 2023-12-17 LAB — COMPREHENSIVE METABOLIC PANEL WITH GFR
AG Ratio: 2.1 (calc) (ref 1.0–2.5)
ALT: 18 U/L (ref 6–29)
AST: 19 U/L (ref 10–35)
Albumin: 3.8 g/dL (ref 3.6–5.1)
Alkaline phosphatase (APISO): 55 U/L (ref 37–153)
BUN: 14 mg/dL (ref 7–25)
CO2: 33 mmol/L — ABNORMAL HIGH (ref 20–32)
Calcium: 9.3 mg/dL (ref 8.6–10.4)
Chloride: 102 mmol/L (ref 98–110)
Creat: 0.96 mg/dL (ref 0.50–1.05)
Globulin: 1.8 g/dL — ABNORMAL LOW (ref 1.9–3.7)
Glucose, Bld: 95 mg/dL (ref 65–139)
Potassium: 4.5 mmol/L (ref 3.5–5.3)
Sodium: 142 mmol/L (ref 135–146)
Total Bilirubin: 0.5 mg/dL (ref 0.2–1.2)
Total Protein: 5.6 g/dL — ABNORMAL LOW (ref 6.1–8.1)
eGFR: 65 mL/min/1.73m2 (ref >=60)

## 2023-12-17 LAB — HEMOGLOBIN A1C
Hgb A1c MFr Bld: 6 % — ABNORMAL HIGH (ref <5.7)
Mean Plasma Glucose: 126 mg/dL
eAG (mmol/L): 7 mmol/L

## 2023-12-17 LAB — MICROALBUMIN / CREATININE URINE RATIO
Creatinine, Urine: 192 mg/dL (ref 20–275)
Microalb, Ur: <0.2 mg/dL

## 2023-12-22 ENCOUNTER — Other Ambulatory Visit: Payer: Self-pay | Admitting: Internal Medicine

## 2023-12-22 ENCOUNTER — Encounter

## 2023-12-22 ENCOUNTER — Other Ambulatory Visit

## 2023-12-25 ENCOUNTER — Ambulatory Visit
Admission: RE | Admit: 2023-12-25 | Discharge: 2023-12-25 | Disposition: A | Discharge status: Home / Self Care | Source: Ambulatory Visit | Attending: Internal Medicine | Admitting: Internal Medicine

## 2023-12-25 DIAGNOSIS — Z1231 Encounter for screening mammogram for malignant neoplasm of breast: Secondary | ICD-10-CM | POA: Insufficient documentation

## 2023-12-25 DIAGNOSIS — Z780 Asymptomatic menopausal state: Secondary | ICD-10-CM | POA: Diagnosis present

## 2023-12-25 NOTE — Telephone Encounter (Signed)
 Requested medication (s) are due for refill today: no  Requested medication (s) are on the active medication list: yes  Last refill:  Semaglutide: 07/16/23 #90 1 RF             Furosemide: 07/16/23 #180 1 RF  Future visit scheduled: no  Notes to clinic:  Semaglutide: off protocol                  Furosemide: no Mg level   Requested Prescriptions  Pending Prescriptions Disp Refills   RYBELSUS 14 MG TABS [Pharmacy Med Name: RYBELSUS TABS 30'S 14MG ] 90 tablet 3    Sig: TAKE 1 TABLET DAILY     Off-Protocol Failed - 12/25/2023  9:01 AM      Failed - Medication not assigned to a protocol, review manually.      Passed - Valid encounter within last 12 months    Recent Outpatient Visits           1 week ago Type 2 diabetes mellitus without complication, without long-term current use of insulin (HCC)   [facility] [place], [place] W, NP   6 months ago Type 2 diabetes mellitus without complication, without long-term current use of insulin (HCC)   [place] [facility] [place], Angeline ORN, NP               furosemide (LASIX) 40 MG tablet [Pharmacy Med Name: FUROSEMIDE TABS 40MG ] 180 tablet 3    Sig: TAKE 1 TABLET TWICE A DAY     Cardiovascular:  Diuretics - Loop Failed - 12/25/2023  9:01 AM      Failed - Mg Level in normal range and within 180 days    No results found for: MG       Passed - K in normal range and within 180 days    Potassium  Date Value Ref Range Status  12/16/2023 4.5 3.5 - 5.3 mmol/L Final         Passed - Ca in normal range and within 180 days    Calcium  Date Value Ref Range Status  12/16/2023 9.3 8.6 - 10.4 mg/dL Final         Passed - Na in normal range and within 180 days    Sodium  Date Value Ref Range Status  12/16/2023 142 135 - 146 mmol/L Final  06/07/2022 144 134 - 144 mmol/L Final         Passed - Cr in normal range and within 180 days    Creat  Date Value Ref Range Status  12/16/2023 0.96 0.50 - 1.05  mg/dL Final   Creatinine, Urine  Date Value Ref Range Status  12/16/2023 192 20 - 275 mg/dL Final         Passed - Cl in normal range and within 180 days    Chloride  Date Value Ref Range Status  12/16/2023 102 98 - 110 mmol/L Final         Passed - Last BP in normal range    BP Readings from Last 1 Encounters:  12/16/23 130/80         Passed - Valid encounter within last 6 months    Recent Outpatient Visits           1 week ago Type 2 diabetes mellitus without complication, without long-term current use of insulin [facility])   [place] [facility] [place], Angeline ORN, NP   6 months ago  Type 2 diabetes mellitus without complication, without long-term current use of insulin [facility])   [place] [facility] [place], Angeline ORN, [place]

## 2023-12-26 ENCOUNTER — Ambulatory Visit: Payer: Self-pay | Admitting: Internal Medicine

## 2024-01-05 ENCOUNTER — Encounter: Payer: Self-pay | Admitting: Internal Medicine

## 2024-01-06 MED ORDER — TIRZEPATIDE 5 MG/0.5ML ~~LOC~~ SOAJ: 5.0000 mg | SUBCUTANEOUS | 0 refills | Status: DC

## 2024-01-14 ENCOUNTER — Encounter: Payer: Self-pay | Admitting: Internal Medicine

## 2024-01-26 ENCOUNTER — Encounter: Payer: Self-pay | Admitting: Neurosurgery

## 2024-02-04 ENCOUNTER — Ambulatory Visit: Admitting: Neurosurgery

## 2024-02-04 NOTE — Progress Notes (Signed)
 [facility] - Physiatry  PROCEDURES: 1. Right L3 and L4 medial branch and L5 dorsal ramus blocks were performed. 2. Fluoroscopic guidance was required to ensure proper needle placement for each of the nerve blocks. 3. The patient was monitored with continuous pulse oximetry throughout the procedure.   DIAGNOSIS/INDICATION FOR PROCEDURE: The patient is a pleasant 68 year-old female followed by [facility] for low back pain. Clinically her symptoms are most consistent with facet mediated pain.  MRI lumbar spine without contrast dated 07/11/2023 imaging and report reviewed today. TECHNIQUE:  Multiplanar, multisequence MR imaging of the lumbar spine was  performed. No intravenous contrast was administered.  COMPARISON:  Lumbar MRI 04/23/2018.  FINDINGS:  FINDINGS  For the purposes of this dictation, the L5-S1 disc space is on image  27, series 21.  Grade 1 anterolisthesis of L4 on L5 has increased and now measures  roughly 6.5 mm (previously this measured roughly 3 mm).  No significant vertebral body height loss.  Multilevel disc space narrowing has increased and is now severe at  L5-S1.  There is disc desiccation to varying degrees at every level  Bone marrow endplate edematous change is noted at L5-S1.  Conus ends at the upper L1 level.  Mild degenerative change of the partially imaged sacroiliac joints  bilaterally.   T12-L1 : Small broad-based disc bulge causes no significant central  canal narrowing. No significant neural foraminal narrowing. Mild  bilateral facet degenerative change.  L1-2: No significant central canal narrowing. No significant neural  foraminal narrowing. Moderate bilateral facet degenerative change.  L2-3: Mild-moderate central canal stenosis from broad-based disc  bulge is new. Moderate bilateral neural foramen narrowing is new.  Insert effusions  L3-4: Small broad-based disc bulge causes no significant central  canal  narrowing. Mild right neural foramen narrowing is new. No  significant left neural foraminal narrowing. Mild bilateral facet  degenerative change.  L4-5: Severe central canal stenosis from unroofing of disc material  is increased there is superimposed severe left subarticular recess  narrowing from left paracentral disc extrusion which is new. Severe  right and moderate left neural foramen narrowing has increased.  Severe bilateral facet degenerative change.  L5-S1: No significant central canal narrowing. Mild right and  moderate left neural foramen narrowing is unchanged significantly.  Mild bilateral facet degenerative change.   IMPRESSION:  *Multilevel degenerative change as increased a few levels and is  most notable at L4-5 where there is severe central canal and left  subarticular recess narrowing.  *Please see above for more details.   Electronically Signed    By: Bryon  Nastasi M.D.    On: 08/02/2023 10:13   She has diabetes mellitus. Last A1c dated 12/16/2023 was 6.0.  Patient is on Xarelto  for history of DVT.  Procedures: Needs valium  02/04/2024: MBB to the right L4-5 and L5-S1 facet joints (9/10 to 0/10) 08/19/2023: Right L5-S1 transforaminal ESI (questionable relief for 2nd day) 07/08/2018: Right L5-S1 transforaminal ESI (50% relief)  IMPRESSION/RESULTS: Patient tolerated the procedure well without any complications.  We used a 3.5 inch needle.  I would recommend a 4 inch, 22-gauge needle for RFA if indicated.  Patient reported pre-injection pain to be 9/10 and post-injection pain to be 0/10.  We will call the patient in 1 to 2 days to see how they have done.          INFORMED CONSENT: The patient understood the potential risks and benefits of the procedure, which were explained to the patient  prior to the procedure.  The patient read and signed the consent stating complete understanding of the information, and wished to proceed with the procedure. There were no  barriers to understanding.  Ample time was given for any questions to be answered prior to the procedure.  The risks of the procedure were explained including, but not limited to the risk of bleeding and/or infection into the spinal area, intervertebral discs, zygapophysial joints, or epidural space; nerve injury; nerve irritation; reaction to medications; etc.  The patient denied any history of bleeding disorders, allergies to medications used or other medical contraindications to the procedure.  No promises were given to any expected outcome.        PROCEDURES: A time out was performed by physician and assistant.  Correct patient, procedure, plan of care, site, position, and equipment were verified.  Identical procedural technique was used for each medial branch block.  The patient was sterilely prepped with a triple scrub of betadine solution and draped in the prone position.  Careful attention was paid to aseptic technique throughout the procedure.  An oblique fluoroscopic view was used to identify the appropriate bony landmark for each medial branch block.  The L3 and L4 medial branch targets were identified as the junction of the transverse process with the corresponding superior articulating process of the L4 and L5  vertebral levels respectively.  The L5 dorsal ramus target was identified at the sacral ala.  The overlying skin and subcutaneous tissues were anesthetized with approximately 2 cc of 1% Xylocaine  at each level.  A 25 gauge spinal needle was inserted down to the appropriate target bony landmark and approximately 0.5 cc of Omnipaque-240 mgI/mL contrast was infiltrated under real-time fluoroscopy without evidence of vascular uptake at each level.  0.5 cc of 0.5% preservative-free Marcaine  was slowly infiltrated for each block.  The needle was then removed.  No aspiration was noted during the procedure despite frequent, intermittent attempts at aspiration.  There were no  complications.   DISCHARGE SUMMARY: The patient was monitored post-injection for 30 minutes in the recovery area and remained stable without evidence of complications.  The patient was discharged with discharge instructions in stable condition. The patient was instructed to contact us  with any problems.  Procedure fluoro time: 26 seconds. Fluoroscopy dose: 9.18 mGy.  This is below the dose threshold #1 (5000 mGy).    BENJAMIN CHARLES CHASNIS, DO

## 2024-02-09 ENCOUNTER — Encounter: Payer: Self-pay | Admitting: Neurosurgery

## 2024-02-09 ENCOUNTER — Ambulatory Visit (INDEPENDENT_AMBULATORY_CARE_PROVIDER_SITE_OTHER): Admitting: Neurosurgery

## 2024-02-09 VITALS — BP 130/80 | Wt 235.2 lb

## 2024-02-09 DIAGNOSIS — M4316 Spondylolisthesis, lumbar region: Secondary | ICD-10-CM | POA: Insufficient documentation

## 2024-02-09 DIAGNOSIS — G8929 Other chronic pain: Secondary | ICD-10-CM | POA: Insufficient documentation

## 2024-02-09 DIAGNOSIS — M549 Dorsalgia, unspecified: Secondary | ICD-10-CM | POA: Diagnosis not present

## 2024-02-09 DIAGNOSIS — M48061 Spinal stenosis, lumbar region without neurogenic claudication: Secondary | ICD-10-CM

## 2024-02-09 NOTE — Progress Notes (Signed)
 Referring Physician:  Antonette Angeline ORN, NP [address] [place],  [place] [address]  Primary Physician:  Antonette Angeline ORN, NP  History of Present Illness: 02/09/2024 Ms. Prince Olivier is here today to follow-up.  We have been following her closely.  Last time we saw her we sent her for pain management.  She has since seen pain management team and has had medial branch blocks and has had good relief.  She is going back for another trial to see whether or not she will qualify for ablations.  She is also been following with her knee doctors.  Currently she states that her knees are the most bothersome to her.  They wake her at night and keep her from sleeping.  She often has to prop them in certain positions and when they move she gets severe pain.   Review of Systems:  A 10 point review of systems is negative, except for the pertinent positives and negatives detailed in the HPI.  Past Medical History: Past Medical History:  Diagnosis Date   Allergy    Arthritis    Cancer (HCC)    endometrial   CHF (congestive heart failure) (HCC)    Chicken pox    Diabetes mellitus without complication (HCC)    GERD (gastroesophageal reflux disease)    Heart murmur    DUE TO RHEUMATIC FEVER PER PT   Hyperlipidemia    Hypertension    Psoriasis    Rheumatic fever    Rosacea    Sleep apnea     Past Surgical History: Past Surgical History:  Procedure Laterality Date   ABDOMINAL HYSTERECTOMY     ANTERIOR AND POSTERIOR REPAIR N/A 12/04/2016   Procedure: ANTERIOR (CYSTOCELE) AND POSTERIOR REPAIR (RECTOCELE), TRANSOBTURATOR SLING PLACEMENT(ARIS);  Surgeon: Janit Alm Agent, MD;  Location: [facility] ORS;  Service: Gynecology;  Laterality: N/A;   COLONOSCOPY WITH PROPOFOL  N/A 05/15/2021   Procedure: COLONOSCOPY WITH PROPOFOL ;  Surgeon: Jinny Carmine, MD;  Location: [facility] ENDOSCOPY;  Service: Endoscopy;  Laterality: N/A;   DILATION AND CURETTAGE OF UTERUS N/A 09/16/2016   Procedure: DILATATION AND CURETTAGE;   Surgeon: Janit Alm Agent, MD;  Location: [facility] ORS;  Service: Gynecology;  Laterality: N/A;   LAPAROSCOPIC HYSTERECTOMY Bilateral 12/04/2016   Procedure: HYSTERECTOMY TOTAL LAPAROSCOPIC WITH BILATERAL SALPINGO OOPHERECTOMY;  Surgeon: Janit Alm Agent, MD;  Location: [facility] ORS;  Service: Gynecology;  Laterality: Bilateral;   MOUTH SURGERY      Allergies: Allergies as of 02/09/2024   (No Known Allergies)    Medications:  Current Outpatient Medications:    acetaminophen  (TYLENOL ) 650 MG CR tablet, Take 1,300 mg by mouth daily., Disp: , Rfl:    busPIRone  (BUSPAR ) 5 MG tablet, TAKE 1 TABLET DAILY AS NEEDED, Disp: 90 tablet, Rfl: 1   cetirizine (ZYRTEC) 10 MG tablet, Take 10 mg by mouth at bedtime. , Disp: , Rfl:    Cholecalciferol 25 MCG (1000 UT) tablet, Take 1,000 Units by mouth daily., Disp: , Rfl:    doxycycline (ADOXA) 100 MG tablet, Take 50 mg by mouth daily., Disp: , Rfl:    ezetimibe  (ZETIA ) 10 MG tablet, TAKE 1 TABLET DAILY, Disp: 90 tablet, Rfl: 1   fluticasone  (FLONASE ) 50 MCG/ACT nasal spray, Place 2 sprays into both nostrils daily., Disp: 16 g, Rfl: 6   folic acid (FOLVITE) 1 MG tablet, Take 1mg  by mouth Sunday to Thursday and 2mg  on Friday and Saturday, Disp: , Rfl:    furosemide  (LASIX ) 40 MG tablet, TAKE 1  TABLET TWICE A DAY, Disp: 180 tablet, Rfl: 3   gabapentin  (NEURONTIN ) 300 MG capsule, TAKE 2 CAPSULES THREE TIMES A DAY, Disp: 540 capsule, Rfl: 0   Golimumab (SIMPONI ARIA IV), Inject into the vein. Reports receives via infusion from Rheumatology clinic, Disp: , Rfl:    hydroxychloroquine (PLAQUENIL) 200 MG tablet, 1 tab Orally twice a day for 90 days, Disp: , Rfl:    losartan  (COZAAR ) 50 MG tablet, TAKE 1 TABLET DAILY, Disp: 90 tablet, Rfl: 0   metFORMIN  (GLUCOPHAGE -XR) 500 MG 24 hr tablet, Take 1 tablet (500 mg total) by mouth 2 (two) times daily with a meal., Disp: 180 tablet, Rfl: 1   methotrexate (RHEUMATREX) 2.5 MG tablet, Take 25 mg by mouth every Friday.  Caution:Chemotherapy. Protect from light., Disp: , Rfl:    omeprazole  (PRILOSEC) 40 MG capsule, TAKE 1 CAPSULE DAILY, Disp: 90 capsule, Rfl: 1   rivaroxaban  (XARELTO ) 10 MG TABS tablet, TAKE 1 TABLET DAILY, Disp: 90 tablet, Rfl: 1   rosuvastatin  (CRESTOR ) 10 MG tablet, TAKE 1 TABLET DAILY, Disp: 90 tablet, Rfl: 1   tirzepatide  (MOUNJARO ) 5 MG/0.5ML Pen, Inject 5 mg into the skin once a week., Disp: 6 mL, Rfl: 0   traMADol (ULTRAM) 50 MG tablet, Take 50 mg by mouth 2 (two) times daily., Disp: , Rfl:    valACYclovir  (VALTREX ) 500 MG tablet, 1 tab po daily. if flare, increase to 4 tabs at onset of symptoms and then 4 tabs 12 hours later. Then return to 1 tab po daily., Disp: 90 tablet, Rfl: 1  Social History: Social History   Tobacco Use   Smoking status: Former    Current packs/day: 0.00    Average packs/day: 4.0 packs/day for 16.0 years (64.0 ttl pk-yrs)    Types: Cigarettes    Start date: 09/12/1979    Quit date: 09/12/1995    Years since quitting: 28.4   Smokeless tobacco: Never   Tobacco comments:    quit in 1997  Vaping Use   Vaping status: Never Used  Substance Use Topics   Alcohol use: No    Alcohol/week: 0.0 standard drinks of alcohol   Drug use: No    Family Medical History: Family History  Problem Relation Age of Onset   Cancer Mother        skin   Dementia Mother    Varicose Veins Mother    Cancer Sister        skin and liver   Stroke Maternal Grandmother    Skin cancer Brother    Cancer Brother    COPD Sister    Diabetes Daughter    Heart disease Neg Hx    Breast cancer Neg Hx     Physical Examination: Vitals:   02/09/24 0939  BP: 130/80    General: Patient is in no apparent distress. Attention to examination is appropriate.  Neck:   Supple.  Full range of motion.  Respiratory: Patient is breathing without any difficulty.   NEUROLOGICAL:   Strength: No major deficits noted.   Reflexes are mostly 2+ throughout, possible Hoffmann sign possible  spreading at her brachial radialis.   Imaging: Narrative & Impression  CLINICAL DATA:  Bilateral leg pain.   EXAM: MRI LUMBAR SPINE WITHOUT CONTRAST   TECHNIQUE: Multiplanar, multisequence MR imaging of the lumbar spine was performed. No intravenous contrast was administered.   COMPARISON:  Lumbar MRI 04/23/2018.   FINDINGS: FINDINGS For the purposes of this dictation, the L5-S1 disc space is on image 27,  series 21.   Grade 1 anterolisthesis of L4 on L5 has increased and now measures roughly 6.5 mm (previously this measured roughly 3 mm).   No significant vertebral body height loss.   Multilevel disc space narrowing has increased and is now severe at L5-S1.   There is disc desiccation to varying degrees at every level   Bone marrow endplate edematous change is noted at L5-S1.   Conus ends at the upper L1 level.   Mild degenerative change of the partially imaged sacroiliac joints bilaterally.   T12-L1 : Small broad-based disc bulge causes no significant central canal narrowing. No significant neural foraminal narrowing. Mild bilateral facet degenerative change.   L1-2: No significant central canal narrowing. No significant neural foraminal narrowing. Moderate bilateral facet degenerative change.   L2-3: Mild-moderate central canal stenosis from broad-based disc bulge is new. Moderate bilateral neural foramen narrowing is new. Insert effusions   L3-4: Small broad-based disc bulge causes no significant central canal narrowing. Mild right neural foramen narrowing is new. No significant left neural foraminal narrowing. Mild bilateral facet degenerative change.   L4-5: Severe central canal stenosis from unroofing of disc material is increased there is superimposed severe left subarticular recess narrowing from left paracentral disc extrusion which is new. Severe right and moderate left neural foramen narrowing has increased. Severe bilateral facet degenerative  change.   L5-S1: No significant central canal narrowing. Mild right and moderate left neural foramen narrowing is unchanged significantly. Mild bilateral facet degenerative change.   IMPRESSION: IMPRESSION *Multilevel degenerative change as increased a few levels and is most notable at L4-5 where there is severe central canal and left subarticular recess narrowing. *Please see above for more details.     Electronically Signed   By: Bryon  Nastasi M.D.   On: 08/02/2023 10:13   Narrative & Impression  CLINICAL DATA:  Right lateral neck pain intermittent x2 years.   EXAM: MRI CERVICAL SPINE WITHOUT CONTRAST   TECHNIQUE: Multiplanar, multisequence MR imaging of the cervical spine was performed. No intravenous contrast was administered.   COMPARISON:  None Available.   FINDINGS: Straightening of the normal cervical lordosis with mild reversal could relate to muscle spasm or could be positional.   No prevertebral soft tissue swelling.   No significant listhesis.   No significant vertebral body height loss.   Multilevel moderate disc space narrowing from C3-4 through C6-7 and also at T1-2.   Cervical spinal cord is normal in signal intensity.   Bone marrow signal is mildly heterogeneous.   Imaging to the visualized portions of the brain parenchyma reveals no acute finding.   C2-3: No significant central canal narrowing. No significant neural foraminal narrowing.   C3-4: Moderate central canal stenosis from disc osteophyte complex causes some flattening of the cervical spinal cord. Moderate right and mild left neural foraminal narrowing from disc osteophyte and uncovertebral hypertrophy.   C4-5: Moderate central canal stenosis from disc osteophyte complex causes some flattening of the cervical spinal cord. Moderate bilateral neural foramen narrowing from disc osteophyte and uncovertebral hypertrophy.   C5-6: Moderate central canal stenosis from disc osteophyte  complex causes some flattening of the cervical spinal cord. Mild right and moderate left neural foraminal narrowing from disc osteophyte and uncovertebral hypertrophy.   C6-7: Mild central canal stenosis from disc osteophyte complex. Mild bilateral neural foraminal narrowing from disc osteophyte and uncovertebral hypertrophy.   C7-T1: No significant central canal narrowing. No significant neural foraminal narrowing.   IMPRESSION: IMPRESSION *Multilevel degenerative change is most  notable at C3-4, C4-5, and C5-6 where there is moderate central canal and neural foraminal narrowing. *Please see above for more details.     Electronically Signed   By: Bryon  Nastasi M.D.   On: 08/02/2023 10:06      I have personally reviewed the images and agree with the above interpretation.  Medical Decision Making/Assessment and Plan: Ms. Earnhart is a pleasant 68 y.o. female with spondylolisthesis at L4-5.  We have been following this.  She had recent EMGs which were negative for any polyradiculopathy or lumbar radiculopathy.  Her upper extremity showed carpal tunnel syndrome.  She does continue to have back pain, however she did have a significant improvement with her last medial branch block.  She says that she was back pain free for over 24 hours.  She is going again for repeat injection to see whether or not this is reproducible.  If it is she may benefit from a ablation procedure.  She is discussing this with her pain team.  In regards to her current issues she states that her knee pain is the most bothersome to her and she wants to focus on that.  I did let her know that if there is any concern for inability to operate on her for her knees that she may be able to get some relief from genicular type injections.  She could also discuss this with Dr. Avanell  I did discuss with her that at some point her spine may become severe enough that she would want intervention.  She does have a known  spondylolisthesis and has some possible claudication symptoms, however her knees are so bothersome to her that she very rarely can walk far enough to make it an issue.  I like to see her again in another 3 months to follow-up.  Penne MICAEL Sharps MD/MSCR Neurosurgery

## 2024-02-23 ENCOUNTER — Other Ambulatory Visit: Payer: Self-pay | Admitting: Internal Medicine

## 2024-02-24 NOTE — Telephone Encounter (Signed)
 Requested Prescriptions  Pending Prescriptions Disp Refills   metFORMIN  (GLUCOPHAGE -XR) 500 MG 24 hr tablet [Pharmacy Med Name: METFORMIN  HCL ER TABS 500MG ] 180 tablet 0    Sig: TAKE 1 TABLET TWICE A DAY WITH MEALS     Endocrinology:  Diabetes - Biguanides Failed - 02/24/2024  3:56 PM      Failed - B12 Level in normal range and within 720 days    No results found for: VITAMINB12       Failed - CBC within normal limits and completed in the last 12 months    WBC  Date Value Ref Range Status  12/16/2023 5.0 3.8 - 10.8 Thousand/uL Final   RBC  Date Value Ref Range Status  12/16/2023 4.06 3.80 - 5.10 Million/uL Final   Hemoglobin  Date Value Ref Range Status  12/16/2023 13.2 11.7 - 15.5 g/dL Final  89/73/7976 87.2 11.1 - 15.9 g/dL Final   HCT  Date Value Ref Range Status  12/16/2023 40.7 35.0 - 45.0 % Final   Hematocrit  Date Value Ref Range Status  03/07/2022 38.0 34.0 - 46.6 % Final   MCHC  Date Value Ref Range Status  12/16/2023 32.4 32.0 - 36.0 g/dL Final    Comment:    For adults, a slight decrease in the calculated MCHC value (in the range of 30 to 32 g/dL) is most likely not clinically significant; however, it should be interpreted with caution in correlation with other red cell parameters and the patient's clinical condition.    [facility]  Date Value Ref Range Status  12/16/2023 32.5 27.0 - 33.0 pg Final   MCV  Date Value Ref Range Status  12/16/2023 100.2 (H) 80.0 - 100.0 fL Final  03/07/2022 92 79 - 97 fL Final   No results found for: PLTCOUNTKUC, LABPLAT, POCPLA RDW  Date Value Ref Range Status  12/16/2023 13.7 11.0 - 15.0 % Final  03/07/2022 14.3 11.7 - 15.4 % Final         Passed - Cr in normal range and within 360 days    Creat  Date Value Ref Range Status  12/16/2023 0.96 0.50 - 1.05 mg/dL Final   Creatinine, Urine  Date Value Ref Range Status  12/16/2023 192 20 - 275 mg/dL Final         Passed - HBA1C is between 0 and 7.9 and within  180 days    Hgb A1c MFr Bld  Date Value Ref Range Status  12/16/2023 6.0 (H) <5.7 % Final    Comment:    For someone without known diabetes, a hemoglobin  A1c value between 5.7% and 6.4% is consistent with prediabetes and should be confirmed with a  follow-up test. . For someone with known diabetes, a value <7% indicates that their diabetes is well controlled. A1c targets should be individualized based on duration of diabetes, age, comorbid conditions, and other considerations. . This assay result is consistent with an increased risk of diabetes. . Currently, no consensus exists regarding use of hemoglobin A1c for diagnosis of diabetes for children. .          Passed - eGFR in normal range and within 360 days    GFR calc Af Amer  Date Value Ref Range Status  04/30/2019 >60 >60 mL/min Final   GFR calc non Af Amer  Date Value Ref Range Status  04/30/2019 >60 >60 mL/min Final   GFR  Date Value Ref Range Status  12/21/2019 53.40 (L) >60.00 mL/min Final   eGFR  Date Value Ref Range Status  12/16/2023 65 > OR = 60 mL/min/1.55m2 Final  06/07/2022 65 >59 mL/min/1.73 Final         Passed - Valid encounter within last 6 months    Recent Outpatient Visits           2 months ago Type 2 diabetes mellitus without complication, without long-term current use of insulin [facility])   [place] [facility] [place], [place] W, NP   8 months ago Type 2 diabetes mellitus without complication, without long-term current use of insulin [facility])   [place] [facility] [place], Angeline ORN, [place]

## 2024-02-27 ENCOUNTER — Other Ambulatory Visit: Payer: Self-pay | Admitting: Internal Medicine

## 2024-03-01 NOTE — Telephone Encounter (Signed)
 Requested Prescriptions  Pending Prescriptions Disp Refills   losartan  (COZAAR ) 50 MG tablet [Pharmacy Med Name: LOSARTAN  TABS 50MG ] 90 tablet 1    Sig: TAKE 1 TABLET DAILY     Cardiovascular:  Angiotensin Receptor Blockers Passed - 03/01/2024 11:14 AM      Passed - Cr in normal range and within 180 days    Creat  Date Value Ref Range Status  12/16/2023 0.96 0.50 - 1.05 mg/dL Final   Creatinine, Urine  Date Value Ref Range Status  12/16/2023 192 20 - 275 mg/dL Final         Passed - K in normal range and within 180 days    Potassium  Date Value Ref Range Status  12/16/2023 4.5 3.5 - 5.3 mmol/L Final         Passed - Patient is not pregnant      Passed - Last BP in normal range    BP Readings from Last 1 Encounters:  02/09/24 130/80         Passed - Valid encounter within last 6 months    Recent Outpatient Visits           2 months ago Type 2 diabetes mellitus without complication, without long-term current use of insulin [facility])   [place] [facility] [place], [place], NP   8 months ago Type 2 diabetes mellitus without complication, without long-term current use of insulin [facility])   [place] [facility] [place], Angeline ORN, [place]

## 2024-03-02 ENCOUNTER — Other Ambulatory Visit: Payer: Self-pay | Admitting: Internal Medicine

## 2024-03-04 NOTE — Telephone Encounter (Signed)
 Requested by interface surescripts. Future visit 06/17/24.  Requested Prescriptions  Pending Prescriptions Disp Refills   gabapentin  (NEURONTIN ) 300 MG capsule [Pharmacy Med Name: GABAPENTIN  CAPS 300MG ] 540 capsule 1    Sig: TAKE 2 CAPSULES THREE TIMES A DAY     Neurology: Anticonvulsants - gabapentin  Passed - 03/04/2024  2:09 PM      Passed - Cr in normal range and within 360 days    Creat  Date Value Ref Range Status  12/16/2023 0.96 0.50 - 1.05 mg/dL Final   Creatinine, Urine  Date Value Ref Range Status  12/16/2023 192 20 - 275 mg/dL Final         Passed - Completed PHQ-2 or PHQ-9 in the last 360 days      Passed - Valid encounter within last 12 months    Recent Outpatient Visits           2 months ago Type 2 diabetes mellitus without complication, without long-term current use of insulin [facility])   [place] [facility] [place], [place] W, NP   8 months ago Type 2 diabetes mellitus without complication, without long-term current use of insulin [facility])   [place] [facility] [place], Angeline ORN, [place]

## 2024-03-20 ENCOUNTER — Other Ambulatory Visit: Payer: Self-pay | Admitting: Internal Medicine

## 2024-03-21 ENCOUNTER — Other Ambulatory Visit: Payer: Self-pay | Admitting: Internal Medicine

## 2024-03-22 NOTE — Telephone Encounter (Signed)
 Requested medication (s) are due for refill today: yes  Requested medication (s) are on the active medication list: yes  Last refill:  01/06/24  Future visit scheduled: yes  Notes to clinic:   Medication not assigned to a protocol, review manually.      Requested Prescriptions  Pending Prescriptions Disp Refills   MOUNJARO  5 MG/0.5ML Pen [Pharmacy Med Name: MOUNJARO  PEN 0.5ML 4'S 5MG ] 6 mL 3    Sig: INJECT 5 MG UNDER THE SKIN WEEKLY     Off-Protocol Failed - 03/22/2024  3:49 PM      Failed - Medication not assigned to a protocol, review manually.      Passed - Valid encounter within last 12 months    Recent Outpatient Visits           3 months ago Type 2 diabetes mellitus without complication, without long-term current use of insulin [facility])   [place] [facility] [address], [place] W, NP   9 months ago Type 2 diabetes mellitus without complication, without long-term current use of insulin [facility])   [place] [facility] [place], Angeline ORN, [place]

## 2024-03-22 NOTE — Telephone Encounter (Signed)
 Requested Prescriptions  Refused Prescriptions Disp Refills   valACYclovir  (VALTREX ) 500 MG tablet [Pharmacy Med Name: VALACYCLOVIR  HCL TABS 500MG ] 90 tablet 4    Sig: TAKE 1 TABLET DAILY, IF FLARE INCREASE TO 4 TABLETS AT ONSET OF SYMPTOMS AND THEN 4 TABLETS 12 HOURS LATER, THEN RETURN TO 1 TABLET DAILY     Antimicrobials:  Antiviral Agents - Anti-Herpetic Passed - 03/22/2024  5:26 PM      Passed - Valid encounter within last 12 months    Recent Outpatient Visits           3 months ago Type 2 diabetes mellitus without complication, without long-term current use of insulin [facility])   [place] [facility] [place], [place] W, NP   9 months ago Type 2 diabetes mellitus without complication, without long-term current use of insulin [facility])   [place] [facility] [place], Angeline ORN, [place]

## 2024-03-23 ENCOUNTER — Encounter: Payer: Self-pay | Admitting: Internal Medicine

## 2024-03-23 MED ORDER — TIRZEPATIDE 7.5 MG/0.5ML ~~LOC~~ SOAJ: 7.5000 mg | SUBCUTANEOUS | 0 refills | Status: DC

## 2024-03-23 NOTE — Addendum Note (Signed)
 Addended by: ZELIA GAUZE D on: 03/23/2024 02:47 PM   Modules accepted: Orders

## 2024-03-23 NOTE — Addendum Note (Signed)
 Addended by: ANTONETTE ANGELINE ORN on: 03/23/2024 08:36 AM   Modules accepted: Orders

## 2024-03-24 ENCOUNTER — Other Ambulatory Visit: Payer: Self-pay

## 2024-03-24 MED ORDER — VALACYCLOVIR HCL 500 MG PO TABS: ORAL_TABLET | ORAL | 1 refills | Status: AC

## 2024-04-05 ENCOUNTER — Telehealth (HOSPITAL_BASED_OUTPATIENT_CLINIC_OR_DEPARTMENT_OTHER): Payer: Self-pay

## 2024-04-05 NOTE — Telephone Encounter (Signed)
 Patient is scheduled for pre-op clearance on 04/16/24 with Reche Finder, NP

## 2024-04-05 NOTE — Telephone Encounter (Signed)
   Name: Melissa Cabrera  DOB: [birth year]  MRN: [id number]  Primary Cardiologist: None  Chart reviewed as part of pre-operative protocol coverage. Because of Ninel Abdella past medical history and time since last visit, she will require a follow-up in-office visit in order to better assess preoperative cardiovascular risk.  Pre-op covering staff: - Please schedule appointment and call patient to inform them. If patient already had an upcoming appointment within acceptable timeframe, please add pre-op clearance to the appointment notes so provider is aware. - Please contact requesting surgeon's office via preferred method (i.e, phone, fax) to inform them of need for appointment prior to surgery.  Xarelto  is not managed by cardiology service.  Lum LITTIE Louis, NP  04/05/2024, 11:41 AM

## 2024-04-05 NOTE — Telephone Encounter (Signed)
   Pre-operative Risk Assessment    Patient Name: Helen Winterhalter  DOB: 1955/05/21 MRN: [id number]   Date of last office visit: 11/30/18 with Dr. Perla Date of next office visit: NA   Request for Surgical Clearance    Procedure:  Left THA  Date of Surgery:  Clearance TBD                                 Surgeon:  Dr. Lorelle Socks Group or Practice Name:  [facility]  Phone number:  [phone] Fax number:  [phone]   Type of Clearance Requested:   - Medical  - Pharmacy:  Hold Rivaroxaban  (Xarelto ) not indicated   Type of Anesthesia:  Not Indicated   Additional requests/questions:    Bonney Augustin JONETTA Delores   04/05/2024, 11:30 AM

## 2024-04-16 ENCOUNTER — Institutional Professional Consult (permissible substitution) (HOSPITAL_BASED_OUTPATIENT_CLINIC_OR_DEPARTMENT_OTHER): Admitting: Family

## 2024-04-20 ENCOUNTER — Ambulatory Visit

## 2024-04-20 VITALS — BP 116/64 | HR 86 | Ht 59.0 in | Wt 211.0 lb

## 2024-04-20 DIAGNOSIS — I5032 Chronic diastolic (congestive) heart failure: Secondary | ICD-10-CM | POA: Diagnosis not present

## 2024-04-20 DIAGNOSIS — E782 Mixed hyperlipidemia: Secondary | ICD-10-CM | POA: Diagnosis not present

## 2024-04-20 DIAGNOSIS — Z7189 Other specified counseling: Secondary | ICD-10-CM | POA: Diagnosis not present

## 2024-04-20 DIAGNOSIS — Z01810 Encounter for preprocedural cardiovascular examination: Secondary | ICD-10-CM

## 2024-04-20 NOTE — Progress Notes (Signed)
  Cardiology Office Note   Date:  04/20/2024  ID:  Melissa Cabrera, DOB 1955/12/25, MRN [id number] PCP: Antonette [place]N, NP  [facility] Providers Cardiologist:  Caron Poser, MD     History of Present Illness Melissa Cabrera is a 68 y.o. female PMH morbid obesity, DM 2, HLD, history of DVT who presents for perioperative cardiovascular risk assessment for upcoming left TKA.  Patient reports she is overall doing well.  Denies any dyspnea on exertion, chest pain, syncope, palpitations, or other significant cardiovascular symptoms.  She is somewhat limited with exertion by severe bilateral knee pain and back pain.  Last LDL 13 12/2023.  Relevant CVD History -TTE 06/2023 normal biventricular function, grade 1 diastolic dysfunction, PASP 33 mmHg, mild MR   ROS: Pt denies any chest discomfort, jaw pain, arm pain, palpitations, syncope, presyncope, orthopnea, PND, or LE edema.  Studies Reviewed I have independently reviewed the patient's ECG, previous cardiac testing, previous medical records, recent blood work.  Physical Exam VS:  BP 116/64 (BP Location: Left Arm, Patient Position: Sitting, Cuff Size: Large)   Pulse 86   Ht 4' 11 (1.499 m)   Wt 211 lb (95.7 kg)   SpO2 98%   BMI 42.62 kg/m        Wt Readings from Last 3 Encounters:  04/20/24 211 lb (95.7 kg)  02/09/24 235 lb 3.2 oz (106.7 kg)  12/16/23 241 lb 8 oz (109.5 kg)    GEN: No acute distress. NECK: No JVD; No carotid bruits. CARDIAC: RRR, no murmurs, rubs, gallops. RESPIRATORY:  Clear to auscultation. EXTREMITIES:  Warm and well-perfused. No edema.  ASSESSMENT AND PLAN Perioperative risk assessment RCRI of 1 indicating patient is low perioperative cardiovascular risk for low risk operation.  She does not have any known ASCVD events.  She does not have any angina.  She is unable to do greater than 4 METS due to severe knee and back pain.  Due to this, we will plan to obtain a stress PET-CT to further stratify her  before surgery.  HLD Well-controlled.  Last LDL 13 12/2023.  Continue Crestor  10 mg daily and Zetia  10 mg daily.  Reported history of HFpEF Patient has a nebulous history of reported HFpEF.  She was diagnosed with this during the COVID pandemic when she was infected twice.  Her symptoms have completely resolved without intervention since then.  She has not had heart failure hospitalizations and does not require consistent diuretic escalation.  Seems unlikely that she actually has HFpEF since her symptoms have resolved, this was potentially more likely to be due to COVID infxn. No further treatment/stratification is needed at this time; we can reassess at subsequent visits should this become a problem or should she develop symptoms     Informed Consent  The risks [chest pain, shortness of breath, cardiac arrhythmias, dizziness, blood pressure fluctuations, myocardial infarction, stroke/transient ischemic attack, nausea, vomiting, allergic reaction, radiation exposure, metallic taste sensation and life-threatening complications (estimated to be 1 in 10,000)], benefits (risk stratification, diagnosing coronary artery disease, treatment guidance) and alternatives of a cardiac PET stress test were discussed in detail with Melissa Cabrera and she agrees to proceed.     Dispo: RTC 1 year or sooner as needed  Signed, Caron Poser, MD

## 2024-04-20 NOTE — Patient Instructions (Signed)
 Medication Instructions:  Your physician recommends that you continue on your current medications as directed. Please refer to the Current Medication list given to you today.  *If you need a refill on your cardiac medications before your next appointment, please call your pharmacy*  Lab Work: No labs ordered today  If you have labs (blood work) drawn today and your tests are completely normal, you will receive your results only by: MyChart Message (if you have MyChart) OR A paper copy in the mail If you have any lab test that is abnormal or we need to change your treatment, we will call you to review the results.  Testing/Procedures:    Please report to Radiology at the [facility] Main Entrance 30 minutes early for your test.  [address] [place], [place] [address]                         OR   Please report to Radiology at [facility] Main Entrance, medical mall, 30 mins prior to your test.  [address]  [place], [place]  How to Prepare for Your Cardiac PET/CT Stress Test:  Nothing to eat or drink, except water, 3 hours prior to arrival time.  NO caffeine/decaffeinated products, or chocolate 12 hours prior to arrival. (Please note decaffeinated beverages (teas/coffees) still contain caffeine).  If you have caffeine within 12 hours prior, the test will need to be rescheduled.  Medication instructions:  Diabetic Preparation: If able to eat breakfast prior to 3 hour fasting, you may take all medications, including your insulin. Do not worry if you miss your breakfast dose of insulin - start at your next meal. If you do not eat prior to 3 hour fast-Hold all diabetes (oral and insulin) medications. Patients who wear a continuous glucose monitor MUST remove the device prior to scanning.  You may take your remaining medications with water.  NO perfume, cologne or lotion on chest or abdomen area. FEMALES - Please avoid wearing dresses to  this appointment.  Total time is 1 to 2 hours; you may want to bring reading material for the waiting time.   In preparation for your appointment, medication and supplies will be purchased.  Appointment availability is limited, so if you need to cancel or reschedule, please call the Radiology Department Scheduler at [phone] 24 hours in advance to avoid a cancellation fee of $100.00  What to Expect When you Arrive:  Once you arrive and check in for your appointment, you will be taken to a preparation room within the Radiology Department.  A technologist or Nurse will obtain your medical history, verify that you are correctly prepped for the exam, and explain the procedure.  Afterwards, an IV will be started in your arm and electrodes will be placed on your skin for EKG monitoring during the stress portion of the exam. Then you will be escorted to the PET/CT scanner.  There, staff will get you positioned on the scanner and obtain a blood pressure and EKG.  During the exam, you will continue to be connected to the EKG and blood pressure machines.  A small, safe amount of a radioactive tracer will be injected in your IV to obtain a series of pictures of your heart along with an injection of a stress agent.    After your Exam:  It is recommended that you eat a meal and drink a caffeinated beverage to counter act any effects of  the stress agent.  Drink plenty of fluids for the remainder of the day and urinate frequently for the first couple of hours after the exam.  Your doctor will inform you of your test results within 7-10 business days.  For more information and frequently asked questions, please visit our website: [email or web address]  For questions about your test or how to prepare for your test, please call: Cardiac Imaging Nurse Navigators Office: [phone]   Follow-Up: At [facility], you and your health needs are our priority.  As part of our continuing  mission to provide you with exceptional heart care, our providers are all part of one team.  This team includes your primary Cardiologist (physician) and Advanced Practice Providers or APPs (Physician Assistants and Nurse Practitioners) who all work together to provide you with the care you need, when you need it.  Your next appointment:   12 month(s)  Provider:   You may see Caron Poser, MD or one of the following Advanced Practice Providers on your designated Care Team:   Lonni Meager, NP Lesley Maffucci, PA-C Bernardino Bring, PA-C Cadence Ansted, PA-C Tylene Lunch, NP Barnie Hila, NP   We recommend signing up for the patient portal called MyChart.  Sign up information is provided on this After Visit Summary.  MyChart is used to connect with patients for Virtual Visits (Telemedicine).  Patients are able to view lab/test results, encounter notes, upcoming appointments, etc.  Non-urgent messages can be sent to your provider as well.   To learn more about what you can do with MyChart, go to [email or web address].

## 2024-04-21 ENCOUNTER — Encounter (HOSPITAL_COMMUNITY): Payer: Self-pay

## 2024-04-21 ENCOUNTER — Encounter: Payer: Self-pay | Admitting: Internal Medicine

## 2024-04-22 ENCOUNTER — Ambulatory Visit: Admitting: Internal Medicine

## 2024-04-22 ENCOUNTER — Ambulatory Visit: Admission: RE | Admit: 2024-04-22 | Discharge: 2024-04-22 | Disposition: A | Source: Ambulatory Visit

## 2024-04-22 DIAGNOSIS — Z01810 Encounter for preprocedural cardiovascular examination: Secondary | ICD-10-CM | POA: Diagnosis not present

## 2024-04-22 DIAGNOSIS — I5032 Chronic diastolic (congestive) heart failure: Secondary | ICD-10-CM | POA: Diagnosis not present

## 2024-04-22 DIAGNOSIS — R918 Other nonspecific abnormal finding of lung field: Secondary | ICD-10-CM | POA: Diagnosis present

## 2024-04-22 DIAGNOSIS — I700 Atherosclerosis of aorta: Secondary | ICD-10-CM | POA: Diagnosis not present

## 2024-04-22 DIAGNOSIS — K760 Fatty (change of) liver, not elsewhere classified: Secondary | ICD-10-CM | POA: Diagnosis not present

## 2024-04-22 DIAGNOSIS — M47814 Spondylosis without myelopathy or radiculopathy, thoracic region: Secondary | ICD-10-CM | POA: Diagnosis not present

## 2024-04-22 DIAGNOSIS — I2510 Atherosclerotic heart disease of native coronary artery without angina pectoris: Secondary | ICD-10-CM | POA: Diagnosis not present

## 2024-04-22 LAB — NM PET CT CARDIAC PERFUSION MULTI W/ABSOLUTE BLOODFLOW
MBFR: 1.32
Nuc Rest EF: 73 %
Nuc Stress EF: 73 %
Peak HR: 86 {beats}/min
Rest HR: 75 {beats}/min
Rest MBF: 1 ml/g/min
Rest Nuclear Isotope Dose: 25.1 mCi
SRS: 0
SSS: 4
ST Depression (mm): 0 mm
Stress MBF: 1.32 ml/g/min
Stress Nuclear Isotope Dose: 25 mCi
TID: 0.93

## 2024-04-22 MED ORDER — RUBIDIUM RB82 GENERATOR (RUBYFILL)
25.0000 | PACK | Freq: Once | INTRAVENOUS | Status: AC
  Administered 2024-04-22: 25.05 via INTRAVENOUS

## 2024-04-22 MED ORDER — REGADENOSON 0.4 MG/5ML IV SOLN
0.4000 mg | Freq: Once | INTRAVENOUS | Status: AC
  Administered 2024-04-22: 0.4 mg via INTRAVENOUS
  Filled 2024-04-22: qty 5

## 2024-04-22 MED ORDER — RUBIDIUM RB82 GENERATOR (RUBYFILL)
25.0000 | PACK | Freq: Once | INTRAVENOUS | Status: AC
  Administered 2024-04-22: 25.03 via INTRAVENOUS

## 2024-04-22 MED ORDER — REGADENOSON 0.4 MG/5ML IV SOLN
INTRAVENOUS | Status: AC
  Filled 2024-04-22: qty 5

## 2024-04-23 ENCOUNTER — Ambulatory Visit: Payer: Self-pay

## 2024-04-23 ENCOUNTER — Other Ambulatory Visit: Payer: Self-pay | Admitting: *Deleted

## 2024-04-23 DIAGNOSIS — R911 Solitary pulmonary nodule: Secondary | ICD-10-CM

## 2024-04-23 DIAGNOSIS — Z0000 Encounter for general adult medical examination without abnormal findings: Secondary | ICD-10-CM

## 2024-04-23 MED ORDER — ASPIRIN 81 MG PO TBEC: 81.0000 mg | DELAYED_RELEASE_TABLET | Freq: Every day | ORAL | Status: DC

## 2024-04-23 NOTE — Patient Instructions (Addendum)
 Ms. Donlan,  Thank you for taking the time for your Medicare Wellness Visit. I appreciate your continued commitment to your health goals. Please review the care plan we discussed, and feel free to reach out if I can assist you further.  Please note that Annual Wellness Visits do not include a physical exam. Some assessments may be limited, especially if the visit was conducted virtually. If needed, we may recommend an in-person follow-up with your provider.  Ongoing Care Seeing your primary care provider every 3 to 6 months helps us  monitor your health and provide consistent, personalized care.   Referrals If a referral was made during today's visit and you haven't received any updates within two weeks, please contact the referred provider directly to check on the status.  Recommended Screenings:  Health Maintenance  Topic Date Due   COVID-19 Vaccine (1) Never done   Pneumococcal Vaccine for age over 58 (1 of 2 - PCV) Never done   Zoster (Shingles) Vaccine (1 of 2) Never done   Flu Shot  08/10/2024*   DTaP/Tdap/Td vaccine (1 - Tdap) 12/15/2024*   Complete foot exam   06/17/2024   Hemoglobin A1C  06/17/2024   Eye exam for diabetics  11/20/2024   Yearly kidney function blood test for diabetes  12/15/2024   Yearly kidney health urinalysis for diabetes  12/15/2024   Breast Cancer Screening  12/24/2024   Medicare Annual Wellness Visit  04/23/2025   Colon Cancer Screening  05/15/2028   Osteoporosis screening with Bone Density Scan  12/24/2028   Hepatitis C Screening  Completed   Meningitis B Vaccine  Aged Out  *Topic was postponed. The date shown is not the original due date.     Vision: Annual vision screenings are recommended for early detection of glaucoma, cataracts, and diabetic retinopathy. These exams can also reveal signs of chronic conditions such as diabetes and high blood pressure.  Dental: Annual dental screenings help detect early signs of oral cancer, gum disease, and  other conditions linked to overall health, including heart disease and diabetes.  Please see the attached documents for additional preventive care recommendations.    NEXT AWV 04/27/25 @ 10:50 AM IN PERSON

## 2024-04-23 NOTE — Progress Notes (Signed)
 Chief Complaint  Patient presents with   Medicare Wellness     Subjective:   Melissa Cabrera is a 68 y.o. female who presents for a Medicare Annual Wellness Visit.  Visit info / Clinical Intake: Medicare Wellness Visit Type:: Subsequent Annual Wellness Visit Persons participating in visit and providing information:: patient Medicare Wellness Visit Mode:: Video Since this visit was completed virtually, some vitals may be partially provided or unavailable. Missing vitals are due to the limitations of the virtual format.: Unable to obtain vitals - no equipment If Telephone or Video please confirm:: I connected with patient using audio/video enable telemedicine. I verified patient identity with two identifiers, discussed telehealth limitations, and patient agreed to proceed. Patient Location:: HOME Provider Location:: OFFICE Interpreter Needed?: No Pre-visit prep was completed: yes AWV questionnaire completed by patient prior to visit?: no Living arrangements:: lives with spouse/significant other Patient's Overall Health Status Rating: good Typical amount of pain: (!) a lot Does pain affect daily life?: (!) yes Are you currently prescribed opioids?: no  Dietary Habits and Nutritional Risks How many meals a day?: 2 Eats fruit and vegetables daily?: yes Most meals are obtained by: preparing own meals In the last 2 weeks, have you had any of the following?: none Diabetic:: (!) yes Any non-healing wounds?: no How often do you check your BS?: 0 Would you like to be referred to a Nutritionist or for Diabetic Management? : no  Functional Status Activities of Daily Living (to include ambulation/medication): Independent Ambulation: Independent Medication Administration: Independent Home Management (perform basic housework or laundry): Independent Manage your own finances?: yes Primary transportation is: driving Concerns about vision?: no *vision screening is required for [facility]* (WEARS  GLASSES ALL DAY- [place])  Fall Screening Falls in the past year?: 0  Home and Transportation Safety: All rugs have non-skid backing?: yes All stairs or steps have railings?: N/A, no stairs Grab bars in the bathtub or shower?: (!) no Have non-skid surface in bathtub or shower?: yes Good home lighting?: yes Regular seat belt use?: yes Hospital stays in the last year:: no  Cognitive Assessment Difficulty concentrating, remembering, or making decisions? : no    Allergies (verified) Patient has no known allergies.   Current Medications (verified) Outpatient Encounter Medications as of 04/23/2024  Medication Sig   acetaminophen  (TYLENOL ) 650 MG CR tablet Take 1,300 mg by mouth daily.   busPIRone  (BUSPAR ) 5 MG tablet TAKE 1 TABLET DAILY AS NEEDED   cetirizine (ZYRTEC) 10 MG tablet Take 10 mg by mouth at bedtime.    Cholecalciferol 25 MCG (1000 UT) tablet Take 1,000 Units by mouth daily.   doxycycline (ADOXA) 100 MG tablet Take 50 mg by mouth daily.   ezetimibe  (ZETIA ) 10 MG tablet TAKE 1 TABLET DAILY   fluticasone  (FLONASE ) 50 MCG/ACT nasal spray Place 2 sprays into both nostrils daily.   folic acid (FOLVITE) 1 MG tablet Take 1mg  by mouth Sunday to Thursday and 2mg  on Friday and Saturday   furosemide  (LASIX ) 40 MG tablet TAKE 1 TABLET TWICE A DAY   gabapentin  (NEURONTIN ) 300 MG capsule TAKE 2 CAPSULES THREE TIMES A DAY   Golimumab (SIMPONI ARIA IV) Inject into the vein. Reports receives via infusion from Rheumatology clinic   hydroxychloroquine (PLAQUENIL) 200 MG tablet 1 tab Orally twice a day for 90 days   losartan  (COZAAR ) 50 MG tablet TAKE 1 TABLET DAILY   metFORMIN  (GLUCOPHAGE -XR) 500 MG 24 hr tablet TAKE 1 TABLET TWICE A DAY WITH MEALS   methotrexate (RHEUMATREX) 2.5  MG tablet Take 25 mg by mouth every Friday. Caution:Chemotherapy. Protect from light.   omeprazole  (PRILOSEC) 40 MG capsule TAKE 1 CAPSULE DAILY   rivaroxaban  (XARELTO ) 10 MG TABS tablet TAKE 1 TABLET DAILY    rosuvastatin  (CRESTOR ) 10 MG tablet TAKE 1 TABLET DAILY   tirzepatide  (MOUNJARO ) 7.5 MG/0.5ML Pen Inject 7.5 mg into the skin once a week.   traMADol (ULTRAM) 50 MG tablet Take 50 mg by mouth 2 (two) times daily.   valACYclovir  (VALTREX ) 500 MG tablet 1 tab po daily. if flare, increase to 4 tabs at onset of symptoms and then 4 tabs 12 hours later. Then return to 1 tab po daily.   No facility-administered encounter medications on file as of 04/23/2024.    History: Past Medical History:  Diagnosis Date   Allergy    Arthritis    Cancer (HCC)    endometrial   CHF (congestive heart failure) (HCC)    Chicken pox    Diabetes mellitus without complication (HCC)    GERD (gastroesophageal reflux disease)    Heart murmur    DUE TO RHEUMATIC FEVER PER PT   Hyperlipidemia    Hypertension    Psoriasis    Rheumatic fever    Rosacea    Sleep apnea    Past Surgical History:  Procedure Laterality Date   ABDOMINAL HYSTERECTOMY     ANTERIOR AND POSTERIOR REPAIR N/A 12/04/2016   Procedure: ANTERIOR (CYSTOCELE) AND POSTERIOR REPAIR (RECTOCELE), TRANSOBTURATOR SLING PLACEMENT(ARIS);  Surgeon: Janit Alm Agent, MD;  Location: [facility];  Service: Gynecology;  Laterality: N/A;   COLONOSCOPY WITH PROPOFOL  N/A 05/15/2021   Procedure: COLONOSCOPY WITH PROPOFOL ;  Surgeon: Jinny Carmine, MD;  Location: [facility];  Service: Endoscopy;  Laterality: N/A;   DILATION AND CURETTAGE OF UTERUS N/A 09/16/2016   Procedure: DILATATION AND CURETTAGE;  Surgeon: Janit Alm Agent, MD;  Location: [facility];  Service: Gynecology;  Laterality: N/A;   LAPAROSCOPIC HYSTERECTOMY Bilateral 12/04/2016   Procedure: HYSTERECTOMY TOTAL LAPAROSCOPIC WITH BILATERAL SALPINGO OOPHERECTOMY;  Surgeon: Janit Alm Agent, MD;  Location: [facility];  Service: Gynecology;  Laterality: Bilateral;   MOUTH SURGERY     Family History  Problem Relation Age of Onset   Cancer Mother        skin   Dementia Mother    Varicose Veins Mother     Cancer Sister        skin and liver   Stroke Maternal Grandmother    Skin cancer Brother    Cancer Brother    COPD Sister    Diabetes Daughter    Heart disease Neg Hx    Breast cancer Neg Hx    Social History   Occupational History   Not on file  Tobacco Use   Smoking status: Former    Current packs/day: 0.00    Average packs/day: 4.0 packs/day for 16.0 years (64.0 ttl pk-yrs)    Types: Cigarettes    Start date: 09/12/1979    Quit date: 09/12/1995    Years since quitting: 28.6   Smokeless tobacco: Never   Tobacco comments:    quit in 1997  Vaping Use   Vaping status: Never Used  Substance and Sexual Activity   Alcohol use: Never   Drug use: Never   Sexual activity: Yes    Birth control/protection: None   Tobacco Counseling Counseling given: Not Answered Tobacco comments: quit in 1997  SDOH Screenings   Food Insecurity: No Food Insecurity (04/17/2024)  Housing: Low Risk (04/17/2024)  Transportation Needs: No Transportation Needs (04/17/2024)  Utilities: Not At Risk (03/08/2024)   Received from [facility]  Alcohol Screen: Low Risk (04/18/2023)  Depression (PHQ2-9): Low Risk (06/18/2023)  Financial Resource Strain: Low Risk (04/17/2024)  Physical Activity: Inactive (04/17/2024)  Social Connections: Moderately Integrated (04/17/2024)  Stress: No Stress Concern Present (04/17/2024)  Tobacco Use: Medium Risk (04/23/2024)  Health Literacy: Adequate Health Literacy (04/18/2023)   See flowsheets for full screening details  Depression Screen PHQ 2 & 9 Depression Scale- Over the past 2 weeks, how often have you been bothered by any of the following problems? Little interest or pleasure in doing things: 1 Feeling down, depressed, or hopeless (PHQ Adolescent also includes...irritable): 0 PHQ-2 Total Score: 1     Goals Addressed             This Visit's Progress    DIET - REDUCE SUGAR INTAKE               Objective:    There were no vitals filed for  this visit. There is no height or weight on file to calculate BMI.  Hearing/Vision screen No results found. Immunizations and Health Maintenance Health Maintenance  Topic Date Due   COVID-19 Vaccine (1) Never done   Pneumococcal Vaccine: 50+ Years (1 of 2 - PCV) Never done   Zoster Vaccines- Shingrix (1 of 2) Never done   Influenza Vaccine  08/10/2024 (Originally 12/12/2023)   DTaP/Tdap/Td (1 - Tdap) 12/15/2024 (Originally 04/03/1975)   FOOT EXAM  06/17/2024   HEMOGLOBIN A1C  06/17/2024   OPHTHALMOLOGY EXAM  11/20/2024   Diabetic kidney evaluation - eGFR measurement  12/15/2024   Diabetic kidney evaluation - Urine ACR  12/15/2024   Mammogram  12/24/2024   Medicare Annual Wellness (AWV)  04/23/2025   Colonoscopy  05/15/2028   Bone Density Scan  Completed   Hepatitis C Screening  Completed   Meningococcal B Vaccine  Aged Out        Assessment/Plan:  This is a routine wellness examination for Melissa Cabrera.  Patient Care Team: Antonette Angeline ORN, NP as PCP - General (Internal Medicine) Argentina Clap, MD as PCP - Cardiology (Cardiology) Alana, Sharyle LABOR, RPH-CPP as Pharmacist [facility], [facility]  I have personally reviewed and noted the following in the patients chart:   Medical and social history Use of alcohol, tobacco or illicit drugs  Current medications and supplements including opioid prescriptions. Functional ability and status Nutritional status Physical activity Advanced directives List of other physicians Hospitalizations, surgeries, and ER visits in previous 12 months Vitals Screenings to include cognitive, depression, and falls Referrals and appointments  No orders of the defined types were placed in this encounter.  In addition, I have reviewed and discussed with patient certain preventive protocols, quality metrics, and best practice recommendations. A written personalized care plan for preventive services as well as general preventive health  recommendations were provided to patient.   Jhonnie GORMAN Das, LPN   [id number]   Return in 1 year (on 04/23/2025).  After Visit Summary: (MyChart) Due to this being a telephonic visit, the after visit summary with patients personalized plan was offered to patient via MyChart   Nurse Notes: DECLINES ALL VACCINES; UTD ON COLONOSCOPY, MAMMOGRAM & BDS  06/14/24 WILL HAVE LEFT KNEE REPLACED

## 2024-04-26 ENCOUNTER — Encounter: Payer: Self-pay | Admitting: Internal Medicine

## 2024-04-26 ENCOUNTER — Ambulatory Visit: Admitting: Internal Medicine

## 2024-04-26 VITALS — BP 110/60 | HR 68 | Ht 60.0 in | Wt 209.6 lb

## 2024-04-26 DIAGNOSIS — Z7985 Long-term (current) use of injectable non-insulin antidiabetic drugs: Secondary | ICD-10-CM

## 2024-04-26 DIAGNOSIS — Z01818 Encounter for other preprocedural examination: Secondary | ICD-10-CM | POA: Diagnosis not present

## 2024-04-26 DIAGNOSIS — M25561 Pain in right knee: Secondary | ICD-10-CM

## 2024-04-26 DIAGNOSIS — E119 Type 2 diabetes mellitus without complications: Secondary | ICD-10-CM | POA: Diagnosis not present

## 2024-04-26 DIAGNOSIS — M25562 Pain in left knee: Secondary | ICD-10-CM | POA: Diagnosis not present

## 2024-04-26 DIAGNOSIS — G8929 Other chronic pain: Secondary | ICD-10-CM | POA: Insufficient documentation

## 2024-04-26 NOTE — Progress Notes (Signed)
 Subjective:    Patient ID: Melissa Cabrera, female    DOB: 05-11-1956, 68 y.o.   MRN: [id number]  HPI  Discussed the use of AI scribe software for clinical note transcription with the patient, who gave verbal consent to proceed.  Melissa Cabrera is a 68 year old female with knee osteoarthritis who presents for preoperative clearance for left total knee arthroplasty.  She is scheduled for a left total knee arthroplasty in February. There was a clerical error in the paperwork, which incorrectly stated the procedure as a left total hip arthroplasty. She has had previous cardiac clearance with a recent EKG.  She experiences chronic knee pain described as 'bone on bone' with associated bone spurs on the lateral aspect of both knees. The pain worsens with weight-bearing activities and prolonged sitting, necessitating frequent position changes. She has tried various treatments, including injections and gel applications, which were ineffective. Currently, she takes tramadol at night and tylenol  arthritis strength, two tablets in the morning and one at night, for pain management. Her simponi is on hold for nine weeks due to the upcoming surgery.  She has lost significant weight, from 255 to 209 pounds over six months with the use of tirzepatide  for treatment of DM2. She reports no significant side effects from the medication. Her dietary changes include focusing on protein and fiber, avoiding sweets, breads, and fried foods, and drinking mostly water and coffee with minimal sugar.  She inquires about the possibility of having lipedema due to persistent fat deposits on her legs, which have not reduced despite overall weight loss. She notes that her body has always carried more weight from the waist down, even when she was younger.       Review of Systems     Past Medical History:  Diagnosis Date   Allergy    Arthritis    Cancer (HCC)    endometrial   CHF (congestive heart failure) (HCC)    Chicken  pox    Diabetes mellitus without complication (HCC)    GERD (gastroesophageal reflux disease)    Heart murmur    DUE TO RHEUMATIC FEVER PER PT   Hyperlipidemia    Hypertension    Psoriasis    Rheumatic fever    Rosacea    Sleep apnea     Current Outpatient Medications  Medication Sig Dispense Refill   acetaminophen  (TYLENOL ) 650 MG CR tablet Take 1,300 mg by mouth daily.     aspirin  EC 81 MG tablet Take 1 tablet (81 mg total) by mouth daily. Swallow whole.     busPIRone  (BUSPAR ) 5 MG tablet TAKE 1 TABLET DAILY AS NEEDED 90 tablet 1   cetirizine (ZYRTEC) 10 MG tablet Take 10 mg by mouth at bedtime.      Cholecalciferol 25 MCG (1000 UT) tablet Take 1,000 Units by mouth daily.     doxycycline (ADOXA) 100 MG tablet Take 50 mg by mouth daily.     ezetimibe  (ZETIA ) 10 MG tablet TAKE 1 TABLET DAILY 90 tablet 1   fluticasone  (FLONASE ) 50 MCG/ACT nasal spray Place 2 sprays into both nostrils daily. 16 g 6   folic acid (FOLVITE) 1 MG tablet Take 1mg  by mouth Sunday to Thursday and 2mg  on Friday and Saturday     furosemide  (LASIX ) 40 MG tablet TAKE 1 TABLET TWICE A DAY 180 tablet 3   gabapentin  (NEURONTIN ) 300 MG capsule TAKE 2 CAPSULES THREE TIMES A DAY 540 capsule 1   Golimumab (SIMPONI ARIA IV) Inject into  the vein. Reports receives via infusion from Rheumatology clinic     hydroxychloroquine (PLAQUENIL) 200 MG tablet 1 tab Orally twice a day for 90 days     losartan  (COZAAR ) 50 MG tablet TAKE 1 TABLET DAILY 90 tablet 1   metFORMIN  (GLUCOPHAGE -XR) 500 MG 24 hr tablet TAKE 1 TABLET TWICE A DAY WITH MEALS 180 tablet 0   methotrexate (RHEUMATREX) 2.5 MG tablet Take 25 mg by mouth every Friday. Caution:Chemotherapy. Protect from light.     omeprazole  (PRILOSEC) 40 MG capsule TAKE 1 CAPSULE DAILY 90 capsule 1   rivaroxaban  (XARELTO ) 10 MG TABS tablet TAKE 1 TABLET DAILY 90 tablet 1   rosuvastatin  (CRESTOR ) 10 MG tablet TAKE 1 TABLET DAILY 90 tablet 1   tirzepatide  (MOUNJARO ) 7.5 MG/0.5ML Pen  Inject 7.5 mg into the skin once a week. 6 mL 0   traMADol (ULTRAM) 50 MG tablet Take 50 mg by mouth 2 (two) times daily.     valACYclovir  (VALTREX ) 500 MG tablet 1 tab po daily. if flare, increase to 4 tabs at onset of symptoms and then 4 tabs 12 hours later. Then return to 1 tab po daily. 90 tablet 1   No current facility-administered medications for this visit.    No Known Allergies  Family History  Problem Relation Age of Onset   Cancer Mother        skin   Dementia Mother    Varicose Veins Mother    Cancer Sister        skin and liver   Stroke Maternal Grandmother    Skin cancer Brother    Cancer Brother    COPD Sister    Diabetes Daughter    Heart disease Neg Hx    Breast cancer Neg Hx     Social History   Socioeconomic History   Marital status: Married    Spouse name: Not on file   Number of children: Not on file   Years of education: Not on file   Highest education level: 12th grade  Occupational History   Not on file  Tobacco Use   Smoking status: Former    Current packs/day: 0.00    Average packs/day: 4.0 packs/day for 16.0 years (64.0 ttl pk-yrs)    Types: Cigarettes    Start date: 09/12/1979    Quit date: 09/12/1995    Years since quitting: 28.6   Smokeless tobacco: Never   Tobacco comments:    quit in 1997  Vaping Use   Vaping status: Never Used  Substance and Sexual Activity   Alcohol use: Never   Drug use: Never   Sexual activity: Yes    Birth control/protection: None  Other Topics Concern   Not on file  Social History Narrative   Not on file   Social Drivers of Health   Tobacco Use: Medium Risk (04/23/2024)   Patient History    Smoking Tobacco Use: Former    Smokeless Tobacco Use: Never    Passive Exposure: Not on Actuary Strain: Low Risk (04/17/2024)   Overall Financial Resource Strain (CARDIA)    Difficulty of Paying Living Expenses: Not hard at all  Food Insecurity: No Food Insecurity (04/23/2024)   Epic    Worried  About Radiation Protection Practitioner of Food in the Last Year: Never true    Ran Out of Food in the Last Year: Never true  Transportation Needs: No Transportation Needs (04/23/2024)   Epic    Lack of Transportation (Medical): No  Lack of Transportation (Non-Medical): No  Physical Activity: Inactive (04/23/2024)   Exercise Vital Sign    Days of Exercise per Week: 0 days    Minutes of Exercise per Session: 0 min  Stress: No Stress Concern Present (04/23/2024)   [employer] of Occupational Health - Occupational Stress Questionnaire    Feeling of Stress: Only a little  Social Connections: Moderately Integrated (04/23/2024)   Social Connection and Isolation Panel    Frequency of Communication with Friends and Family: More than three times a week    Frequency of Social Gatherings with Friends and Family: More than three times a week    Attends Religious Services: More than 4 times per year    Active Member of Clubs or Organizations: No    Attends Banker Meetings: Never    Marital Status: Married  Catering Manager Violence: Not At Risk (04/23/2024)   Epic    Fear of Current or Ex-Partner: No    Emotionally Abused: No    Physically Abused: No    Sexually Abused: No  Depression (PHQ2-9): Low Risk (04/23/2024)   Depression (PHQ2-9)    PHQ-2 Score: 0  Alcohol Screen: Low Risk (04/18/2023)   Alcohol Screen    Last Alcohol Screening Score (AUDIT): 0  Housing: Unknown (04/23/2024)   Epic    Unable to Pay for Housing in the Last Year: No    Number of Times Moved in the Last Year: Not on file    Homeless in the Last Year: No  Utilities: Not At Risk (04/23/2024)   Epic    Threatened with loss of utilities: No  Health Literacy: Adequate Health Literacy (04/23/2024)   B1300 Health Literacy    Frequency of need for help with medical instructions: Never     Constitutional:  Denies fever, malaise, fatigue, headache or abnormal weight changes.  HEENT:  Denies eye pain, eye redness, ear  pain, ringing in the ears, wax buildup, runny nose, nasal congestion, bloody nose, or sore throat. Respiratory: Patient reports intermittent shortness of breath.  Denies difficulty breathing, cough or sputum production.   Cardiovascular: Patient reports swelling in legs.  Denies chest pain, chest tightness, palpitations or swelling in the hands.  Gastrointestinal: Denies abdominal pain, bloating, constipation, diarrhea or blood in the stool.  GU: Pt reports mixed incontinence. Denies pain with urination, burning sensation, blood in urine, odor or discharge. Musculoskeletal: Pt reports low back pain, bilateral knee pain, bilateral foot pain, difficulty with gait. Denies decrease in range of motion, muscle pain or joint swelling.  Skin: Denies redness, rashes, lesions or ulcercations.  Neurological: Patient reports neuropathic pain of BUE/BLE.  Denies dizziness, difficulty with memory, difficulty with speech or problems with balance and coordination.  Psych: Patient has a history of anxiety.  Denies anxiety, depression, SI/HI.  No other specific complaints in a complete review of systems (except as listed in HPI above).  Objective:   Physical Exam BP 110/60 (BP Location: Right Arm, Patient Position: Sitting, Cuff Size: Large)   Pulse 68   Ht 5' (1.524 m)   Wt 209 lb 9.6 oz (95.1 kg)   SpO2 98%   BMI 40.93 kg/m      Wt Readings from Last 3 Encounters:  04/20/24 211 lb (95.7 kg)  02/09/24 235 lb 3.2 oz (106.7 kg)  12/16/23 241 lb 8 oz (109.5 kg)    General: Appears her stated age, obese, in NAD. Skin: Warm, dry and intact.  Senile purpura noted of BUE.  No  ulcerations noted. Cardiovascular: Normal rate and rhythm. S1,S2 noted.  No murmur, rubs or gallops noted. No JVD. No BLE edema.  Pulmonary/Chest: Normal effort and positive vesicular breath sounds. No respiratory distress. No wheezes, rales or ronchi noted.  Abdomen: Normal bowel sounds.  Musculoskeletal: Normal flexion extension  of the left knee but painful extension.  No pain with palpation of the left knee.  No joint swelling noted.  She has difficulty getting from a sitting to a standing position.  Gait slow and steady without device. Neurological: Alert and oriented. Coordination normal.  Psychiatric: Mood and affect normal. Behavior is normal. Judgment and thought content normal.     BMET    Component Value Date/Time   NA 142 12/16/2023 0923   NA 144 06/07/2022 0927   K 4.5 12/16/2023 0923   CL 102 12/16/2023 0923   CO2 33 (H) 12/16/2023 0923   GLUCOSE 95 12/16/2023 0923   BUN 14 12/16/2023 0923   BUN 15 06/07/2022 0927   CREATININE 0.96 12/16/2023 0923   CALCIUM  9.3 12/16/2023 0923   GFRNONAA >60 04/30/2019 1203   GFRAA >60 04/30/2019 1203    Lipid Panel     Component Value Date/Time   CHOL 95 12/16/2023 0923   CHOL 144 06/07/2022 0927   TRIG 99 12/16/2023 0923   HDL 64 12/16/2023 0923   HDL 58 06/07/2022 0927   CHOLHDL 1.5 12/16/2023 0923   VLDL 39.0 12/21/2019 1149   LDLCALC 13 12/16/2023 0923    CBC    Component Value Date/Time   WBC 5.0 12/16/2023 0923   RBC 4.06 12/16/2023 0923   HGB 13.2 12/16/2023 0923   HGB 12.7 03/07/2022 0912   HCT 40.7 12/16/2023 0923   HCT 38.0 03/07/2022 0912   PLT 245 12/16/2023 0923   PLT 298 03/07/2022 0912   MCV 100.2 (H) 12/16/2023 0923   MCV 92 03/07/2022 0912   MCH 32.5 12/16/2023 0923   MCHC 32.4 12/16/2023 0923   RDW 13.7 12/16/2023 0923   RDW 14.3 03/07/2022 0912   LYMPHSABS 2.1 04/30/2019 1203   MONOABS 0.6 04/30/2019 1203   EOSABS 0.2 04/30/2019 1203   BASOSABS 0.0 04/30/2019 1203    Hgb A1C Lab Results  Component Value Date   HGBA1C 6.0 (H) 12/16/2023           Assessment & Plan:  Assessment and Plan    Preoperative clearance for left total knee arthroplasty Preoperative clearance needed for left total knee arthroplasty. Current documentation incorrect. Cardiac clearance obtained. - Contact surgical team to correct  documentation. - Order preoperative labs including CBC, c-Met and A1c.  Bilateral knee osteoarthritis with chronic pain Chronic bilateral knee pain due to osteoarthritis. Previous treatments ineffective. Current pain management includes tramadol and Tylenol . - Continue tramadol and Tylenol . - Encouraged continued weight loss.  Type 2 diabetes mellitus Managed with dietary modifications and Mounjaro . Recent dosage increase to 7.5 mg. No significant side effects. A1c needs assessment. - Order A1c. - Continue dietary modifications.  Obesity Weight loss from 255 lbs to 209 lbs over six months. - Continue Mounjaro  with potential dosage increase. - Encouraged continued dietary modifications and weight loss.         RTC in 2 months for follow-up of chronic conditions Angeline Laura, NP

## 2024-04-26 NOTE — Patient Instructions (Signed)
 Knee Exercises Ask your health care provider which exercises are safe for you. Do exercises exactly as told by your health care provider and adjust them as directed. It is normal to feel mild stretching, pulling, tightness, or discomfort as you do these exercises. Stop right away if you feel sudden pain or your pain gets worse. Do not begin these exercises until told by your health care provider. Stretching and range-of-motion exercises These exercises warm up your muscles and joints and improve the movement and flexibility of your knee. These exercises also help to relieve pain and swelling. Knee extension, prone  Lie on your abdomen (prone position) on a bed. Place your left / right knee just beyond the edge of the surface so your knee is not on the bed. You can put a towel under your left / right thigh just above your kneecap for comfort. Relax your leg muscles and allow gravity to straighten your knee (extension). You should feel a stretch behind your left / right knee. Hold this position for __________ seconds. Scoot up so your knee is supported between repetitions. Repeat __________ times. Complete this exercise __________ times a day. Knee flexion, active  Lie on your back with both legs straight. If this causes back discomfort, bend your left / right knee so your foot is flat on the floor. Slowly slide your left / right heel back toward your buttocks. Stop when you feel a gentle stretch in the front of your knee or thigh (flexion). Hold this position for __________ seconds. Slowly slide your left / right heel back to the starting position. Repeat __________ times. Complete this exercise __________ times a day. Quadriceps stretch, prone  Lie on your abdomen on a firm surface, such as a bed or padded floor. Bend your left / right knee and hold your ankle. If you cannot reach your ankle or pant leg, loop a belt around your foot and grab the belt instead. Gently pull your heel toward your  buttocks. Your knee should not slide out to the side. You should feel a stretch in the front of your thigh and knee (quadriceps). Hold this position for __________ seconds. Repeat __________ times. Complete this exercise __________ times a day. Hamstring, supine  Lie on your back (supine position). Loop a belt or towel over the ball of your left / right foot. The ball of your foot is on the walking surface, right under your toes. Straighten your left / right knee and slowly pull on the belt to raise your leg until you feel a gentle stretch behind your knee (hamstring). Do not let your knee bend while you do this. Keep your other leg flat on the floor. Hold this position for __________ seconds. Repeat __________ times. Complete this exercise __________ times a day. Strengthening exercises These exercises build strength and endurance in your knee. Endurance is the ability to use your muscles for a long time, even after they get tired. Quadriceps, isometric This exercise strengthens the muscles in front of your thigh (quadriceps) without moving your knee joint (isometric). Lie on your back with your left / right leg extended and your other knee bent. Put a rolled towel or small pillow under your knee if told by your health care provider. Slowly tense the muscles in the front of your left / right thigh. You should see your kneecap slide up toward your hip or see increased dimpling just above the knee. This motion will push the back of the knee toward the floor.  For __________ seconds, hold the muscle as tight as you can without increasing your pain. Relax the muscles slowly and completely. Repeat __________ times. Complete this exercise __________ times a day. Straight leg raises This exercise strengthens the muscles in front of your thigh (quadriceps) and the muscles that move your hips (hip flexors). Lie on your back with your left / right leg extended and your other knee bent. Tense the  muscles in the front of your left / right thigh. You should see your kneecap slide up or see increased dimpling just above the knee. Your thigh may even shake a bit. Keep these muscles tight as you raise your leg 4-6 inches (10-15 cm) off the floor. Do not let your knee bend. Hold this position for __________ seconds. Keep these muscles tense as you lower your leg. Relax your muscles slowly and completely after each repetition. Repeat __________ times. Complete this exercise __________ times a day. Hamstring, isometric  Lie on your back on a firm surface. Bend your left / right knee about __________ degrees. Dig your left / right heel into the surface as if you are trying to pull it toward your buttocks. Tighten the muscles in the back of your thighs (hamstring) to "dig" as hard as you can without increasing any pain. Hold this position for __________ seconds. Release the tension gradually and allow your muscles to relax completely for __________ seconds after each repetition. Repeat __________ times. Complete this exercise __________ times a day. Hamstring curls If told by your health care provider, do this exercise while wearing ankle weights. Begin with __________lb / kg weights. Then increase the weight by 1 lb (0.5 kg) increments. Do not wear ankle weights that are more than __________lb / kg. Lie on your abdomen with your legs straight. Bend your left / right knee as far as you can without feeling pain. Keep your hips flat against the floor. Hold this position for __________ seconds. Slowly lower your leg to the starting position. Repeat __________ times. Complete this exercise __________ times a day. Squats This exercise strengthens the muscles in front of your thigh and knee (quadriceps). Stand in front of a table, with your feet and knees pointing straight ahead. You may rest your hands on the table for balance but not for support. Slowly bend your knees and lower your hips like you  are going to sit in a chair. Keep your weight over your heels, not over your toes. Keep your lower legs upright so they are parallel with the table legs. Do not let your hips go lower than your knees. Do not bend lower than told by your health care provider. If your knee pain increases, do not bend as low. Hold the squat position for __________ seconds. Slowly push with your legs to return to standing. Do not use your hands to pull yourself to standing. Repeat __________ times. Complete this exercise __________ times a day. Wall slides This exercise strengthens the muscles in front of your thigh and knee (quadriceps). Lean your back against a smooth wall or door, and walk your feet out 18-24 inches (46-61 cm) from it. Place your feet hip-width apart. Slowly slide down the wall or door until your knees bend __________ degrees. Keep your knees over your heels, not over your toes. Keep your knees in line with your hips. Hold this position for __________ seconds. Repeat __________ times. Complete this exercise __________ times a day. Straight leg raises, side-lying This exercise strengthens the muscles that rotate  the leg at the hip and move it away from your body (hip abductors). Lie on your side with your left / right leg in the top position. Lie so your head, shoulder, knee, and hip line up. You may bend your bottom knee to help you keep your balance. Roll your hips slightly forward so your hips are stacked directly over each other and your left / right knee is facing forward. Leading with your heel, lift your top leg 4-6 inches (10-15 cm). You should feel the muscles in your outer hip lifting. Do not let your foot drift forward. Do not let your knee roll toward the ceiling. Hold this position for __________ seconds. Slowly return your leg to the starting position. Let your muscles relax completely after each repetition. Repeat __________ times. Complete this exercise __________ times a  day. Straight leg raises, prone This exercise stretches the muscles that move your hips away from the front of the pelvis (hip extensors). Lie on your abdomen on a firm surface. You can put a pillow under your hips if that is more comfortable. Tense the muscles in your buttocks and lift your left / right leg about 4-6 inches (10-15 cm). Keep your knee straight as you lift your leg. Hold this position for __________ seconds. Slowly lower your leg to the starting position. Let your leg relax completely after each repetition. Repeat __________ times. Complete this exercise __________ times a day. This information is not intended to replace advice given to you by your health care provider. Make sure you discuss any questions you have with your health care provider. Document Revised: 01/09/2021 Document Reviewed: 01/09/2021 [employer] Patient Education  2024 [employer].

## 2024-04-27 ENCOUNTER — Ambulatory Visit: Payer: Self-pay | Admitting: Internal Medicine

## 2024-04-27 ENCOUNTER — Encounter: Payer: Self-pay | Admitting: Internal Medicine

## 2024-04-27 LAB — COMPREHENSIVE METABOLIC PANEL WITH GFR
AG Ratio: 2.1 (calc) (ref 1.0–2.5)
ALT: 17 U/L (ref 6–29)
AST: 26 U/L (ref 10–35)
Albumin: 4.2 g/dL (ref 3.6–5.1)
Alkaline phosphatase (APISO): 70 U/L (ref 37–153)
BUN/Creatinine Ratio: 13 (calc) (ref 6–22)
BUN: 15 mg/dL (ref 7–25)
CO2: 29 mmol/L (ref 20–32)
Calcium: 9.3 mg/dL (ref 8.6–10.4)
Chloride: 102 mmol/L (ref 98–110)
Creat: 1.15 mg/dL — ABNORMAL HIGH (ref 0.50–1.05)
Globulin: 2 g/dL (ref 1.9–3.7)
Glucose, Bld: 85 mg/dL (ref 65–99)
Potassium: 3.9 mmol/L (ref 3.5–5.3)
Sodium: 141 mmol/L (ref 135–146)
Total Bilirubin: 0.5 mg/dL (ref 0.2–1.2)
Total Protein: 6.2 g/dL (ref 6.1–8.1)
eGFR: 52 mL/min/1.73m2 — ABNORMAL LOW (ref >=60)

## 2024-04-27 LAB — CBC
HCT: 39.3 % (ref 35.9–46.0)
Hemoglobin: 13 g/dL (ref 11.7–15.5)
MCH: 32.7 pg (ref 27.0–33.0)
MCHC: 33.1 g/dL (ref 31.6–35.4)
MCV: 99 fL (ref 81.4–101.7)
MPV: 9.7 fL (ref 7.5–12.5)
Platelets: 273 Thousand/uL (ref 140–400)
RBC: 3.97 Million/uL (ref 3.80–5.10)
RDW: 13.7 % (ref 11.0–15.0)
WBC: 5.1 Thousand/uL (ref 3.8–10.8)

## 2024-04-27 LAB — HEMOGLOBIN A1C
Hgb A1c MFr Bld: 5.3 % (ref <5.7)
Mean Plasma Glucose: 105 mg/dL
eAG (mmol/L): 5.8 mmol/L

## 2024-04-27 NOTE — Progress Notes (Signed)
 HPI The patient is a pleasant 68 year old right-hand-dominant female who presents today for follow up of acute on chronic bilateral right much greater low back pain with radiation to the buttock to the right posterior thigh right much greater than left. Her pain is moderate stiff and intermittent. Her pain is increasing since 2019 without injury and states since her 30s she has had pain in her right low back.  Her pain is worsened with standing, walking, lifting, twisting, squatting and stairs.  Rest can help to alleviate her pain.  In the past she has participated in physical therapy.  She states in the past she has received an epidural steroid injection and this has been helpful.  She states she has urgency of bowel and bladder but awareness she denies any numbness in the groin.   Medications have included Tylenol  (mild relief).   She was last evaluated by neurosurgery on 08/07/2023 at which time she was experiencing intermittent right-sided neck pain with difficulty with hand dexterity and significant balance and dexterity issues she is to follow-up with Dr. Claudene to discuss cervical stenosis that is moderate at C3-C6.  It was recommended that she participate in physical therapy and consider epidural steroid injections for the lumbar spine.  She presents today with her husband.   She is also followed for bilateral posterior cervical spine pain and states that she has noted difficulty with fine motor task in the right hand she is right-hand dominant.  She has been evaluated by Dr. Claudene.  She states that it has been greater than a year that she has stopped reaching upward to get the serial due to pain radiating down her back with this.  She was evaluated by neurosurgery Dr. Claudene on 11/03/2023 at which time it was noted that recent EMGs were negative for any polyradiculopathy or lumbar radiculopathy upper extremity showed carpal tunnel syndrome.  Discussion was made for weight loss prior to surgical  intervention.  She was to have a goal of BMI closer to 40 to discuss this.  She was last evaluated on 01/26/2024 at which time she was experiencing right-sided low back pain with occasional radiating pain to the buttock.  She was to continue with Tylenol  and neurosurgery and gabapentin  and prednisone  along with exercises as learned in physical therapy.  She is given a trial of tramadol and scheduled for medial branch blocks with goal of radiofrequency ablation.  At today's visit she is experiencing some continued right sided low back pain with rating pain to the right buttock.  Some benefit following radiofrequency ablation.  She states tramadol is slightly helpful taking this in the evening.  I will refill this and we also discussed a trigger point injection she has significant anxiety around needles and prefers to avoid this at this time.  We discussed trialing Robaxin and she wishes to move forward with this.  She is also going to follow-up with neurosurgery.  She keeps her medication as a place and does not share with others.  She is on Xarelto   The [place]  Narcotic Database was reviewed today.  The patient has signed a curator contract (01/26/2024). We have discussed the realistic benefits and known risk of opioid therapy.  Patient verbalized understanding. Dr. Avanell and I dicussed the role of long term narcotics for this patient and is in agreeance with the medication prescribed.   Procedures: Needs valium  03/16/2024: RFA to the right L4-5 and L5-S1 facet joints (50% relief) 02/24/2024: MBB to the right L4-5 and  L5-S1 facet joints (9/10 to 0/10) 02/04/2024: MBB to the right L4-5 and L5-S1 facet joints (9/10 to 0/10) 08/19/2023: Right L5-S1 transforaminal ESI (questionable relief for 2nd day) 07/08/2018: Right L5-S1 transforaminal ESI (50% relief)  Past Medical History:  Diagnosis Date   Arthritis    Diabetes mellitus type 2, uncomplicated (CMS/HHS-HCC)     Hypertension    Psoriasis    Rosacea     Past Surgical History:  Procedure Laterality Date   COLPORRHAPHY FOR REPAIR CYSTOCELE ANTERIOR     HYSTERECTOMY VAGINAL W/REMOVAL TUBES &/OR OVARIES     REPAIR RECTOCELE      Social History   Socioeconomic History   Marital status: Married  Tobacco Use   Smoking status: Former   Smokeless tobacco: Never  Advertising Account Planner   Vaping status: Never Used  Substance and Sexual Activity   Alcohol use: No   Drug use: No   Sexual activity: Defer   Social Drivers of Corporate Investment Banker Strain: Low Risk (04/17/2024)   Received [facility]   Overall Financial Resource Strain (CARDIA)    How hard is it for you to pay for the very basics like food, housing, medical care, and heating?: Not hard at all  Food Insecurity: No Food Insecurity (04/23/2024)   Received from [facility]   Hunger Vital Sign    Within the past 12 months, you worried that your food would run out before you got the money to buy more.: Never true    Within the past 12 months, the food you bought just didn't last and you didn't have money to get more.: Never true  Transportation Needs: No Transportation Needs (04/23/2024)   Received from [facility] - Transportation    In the past 12 months, has lack of transportation kept you from medical appointments or from getting medications?: No    In the past 12 months, has lack of transportation kept you from meetings, work, or from getting things needed for daily living?: No    No family history on file.  Current Outpatient Medications on File Prior to Visit  Medication Sig Dispense Refill   acetaminophen  (TYLENOL ) 325 MG tablet Take 650 mg by mouth 2 (two) times daily as needed for Pain     ascorbic acid, vitamin C, (VITAMIN C) 500 MG tablet Take 500 mg by mouth 2 (two) times daily     cholecalciferol (VITAMIN D3) 2,000 unit tablet Take 2,000 Units by mouth once daily     clobetasoL (TEMOVATE)  0.05 % ointment 1 Application     diphenhydrAMINE (BENADRYL) 25 mg capsule Take 50 mg by mouth 2 (two) times daily     doxycycline monohydrate 100 mg Tab tablet Take 50 mg by mouth once daily for rosacea     ezetimibe  (ZETIA ) 10 mg tablet Take 10 mg by mouth once daily     fluticasone  (FLONASE ) 50 mcg/actuation nasal spray Place 2 sprays into both nostrils once daily. 16 g 0   folic acid (FOLVITE) 1 MG tablet TAKE 1 TABLET BY MOUTH ONCE DAILY (Patient taking differently: Take 1 mg by mouth as directed 1mg  Sunday through Thursday, 3mg  on Friday & Saturday) 90 tablet 1   FUROsemide  (LASIX ) 40 MG tablet Take 1 tablet by mouth 2 (two) times daily     gabapentin  (NEURONTIN ) 300 MG capsule Take 2 capsules by mouth 3 (three) times a day     golimumab (SIMPONI ARIA IV) Inject into the vein every  2 (two) months     hydroxychloroquine (PLAQUENIL) 200 mg tablet Take 200 mg by mouth 2 (two) times daily     losartan  (COZAAR ) 50 MG tablet Take 1 tablet by mouth once daily     metFORMIN  (GLUCOPHAGE -XR) 500 MG XR tablet Take 500 mg by mouth 2 (two) times daily     methotrexate (RHEUMATREX) 2.5 MG tablet Take 10 tablets by mouth once a week     MOUNJARO  5 mg/0.5 mL pen injector Inject 5 mg subcutaneously every 7 (seven) days     omega-3 fatty acids-fish oil 300-1,000 mg capsule Take 1 capsule by mouth once daily      omeprazole  (PRILOSEC) 40 MG DR capsule Take 40 mg by mouth nightly.     predniSONE  (DELTASONE ) 5 MG tablet Take 5 mg by mouth once daily     rivaroxaban  (XARELTO ) 10 mg tablet Take 10 mg by mouth once daily     valACYclovir  (VALTREX ) 500 MG tablet Take 500 mg by mouth once daily     zinc acetate 50 mg (zinc) Cap Take 50 mg by mouth once daily     No current facility-administered medications on file prior to visit.    Allergies as of 04/27/2024   (No Known Allergies)    ROS More than 10 system, review of system form was given to the patient to fill out and has been signed  by [employer] FNP-C and scanned into the patient's chart.   Vital signs Vitals:   04/27/24 1042  BP: (!) 143/72  Pulse: 81  Temp: 36.7 C (98 F)  TempSrc: Oral  Weight: 97.5 kg (214 lb 15.2 oz)  Height: 154.9 cm (5' 0.98)  PainSc:   9  PainLoc: Back    Exam General: Alert oriented obese no distress  Cervical Exam (performed 08/14/2023) Cervical rotation is full to the right and left Spurling's maneuver produces posterior cervical spine pain.  Forward flexion is negative hyperextension produces mild pain radiating to the thoracic spine.  Upper Extremity Exam She has 5/5 strength in bilateral wrist extensors, biceps, triceps, deltoids, shoulder internal/external rotators.  Sensation is intact to light touch bilaterally.  She has +2 bilateral bicep, tricep and brachialis reflexes bilaterally.  Questionable Hoffmann's on the right negative to the left.  Lumbosacral Exam (performed 08/14/2023) Upon inspection there are no rashes or scars.  She has mild tenderness to palpation to the right L5 paraspinal musculature.  Extension rotation produces bilateral low back pain right greater than left.  Forward flexion produces bilateral low back pain.  Lower Extremity Exam She has 5/5 strength in bilateral dorsiflexors, knee extensors and 4/5 strength in bilateral hip flexors.  Sensation is intact to light touch bilaterally.  Straight leg raise is negative bilaterally.  Full range of motion to bilateral hip joints without pain elicited.  Absent bilateral patellar and Achilles reflexes.  No ankle clonus.  Radiographic Data   Impression 1.  Acute on chronic bilateral low back pain right much greater than left with rating pain to the right buttock and posterior thigh mildly on the left.  Clinically her symptoms are most consistent with spinal stenosis with neurogenic claudication.MRI LUMBAR SPINE WITHOUT CONTRAST  TECHNIQUE:  Multiplanar, multisequence MR imaging of the lumbar spine was   performed. No intravenous contrast was administered.  COMPARISON:  Lumbar MRI 04/23/2018.  FINDINGS:  FINDINGS  For the purposes of this dictation, the L5-S1 disc space is on image  27, series 21.  Grade 1 anterolisthesis of L4 on  L5 has increased and now measures  roughly 6.5 mm (previously this measured roughly 3 mm).  No significant vertebral body height loss.  Multilevel disc space narrowing has increased and is now severe at  L5-S1.  There is disc desiccation to varying degrees at every level  Bone marrow endplate edematous change is noted at L5-S1.  Conus ends at the upper L1 level.  Mild degenerative change of the partially imaged sacroiliac joints  bilaterally.  T12-L1 : Small broad-based disc bulge causes no significant central  canal narrowing. No significant neural foraminal narrowing. Mild  bilateral facet degenerative change.  L1-2: No significant central canal narrowing. No significant neural  foraminal narrowing. Moderate bilateral facet degenerative change.  L2-3: Mild-moderate central canal stenosis from broad-based disc  bulge is new. Moderate bilateral neural foramen narrowing is new.  Insert effusions  L3-4: Small broad-based disc bulge causes no significant central  canal narrowing. Mild right neural foramen narrowing is new. No  significant left neural foraminal narrowing. Mild bilateral facet  degenerative change.  L4-5: Severe central canal stenosis from unroofing of disc material  is increased there is superimposed severe left subarticular recess  narrowing from left paracentral disc extrusion which is new. Severe  right and moderate left neural foramen narrowing has increased.  Severe bilateral facet degenerative change.  L5-S1: No significant central canal narrowing. Mild right and  moderate left neural foramen narrowing is unchanged significantly.  Mild bilateral facet degenerative change.  IMPRESSION:  IMPRESSION  *Multilevel degenerative change as  increased a few levels and is  most notable at L4-5 where there is severe central canal and left  subarticular recess narrowing.  *Please see above for more details.  Electronically Signed    By: Bryon  Nastasi M.D.    On: 08/02/2023 10:13    2.  Acute on chronic bilateral posterior cervical spine pain with difficulty with fine motor task and difficulty with balance that has been ongoing for the last year.  She is planning to follow-up with the Dr. Claudene next week and has been evaluated states he for evaluation of cervical myelopathy.MRI CERVICAL SPINE WITHOUT CONTRAST  TECHNIQUE:  Multiplanar, multisequence MR imaging of the cervical spine was  performed. No intravenous contrast was administered.  COMPARISON:  None Available.  FINDINGS:  Straightening of the normal cervical lordosis with mild reversal  could relate to muscle spasm or could be positional.  No prevertebral soft tissue swelling.  No significant listhesis.  No significant vertebral body height loss.  Multilevel moderate disc space narrowing from C3-4 through C6-7 and  also at T1-2.  Cervical spinal cord is normal in signal intensity.  Bone marrow signal is mildly heterogeneous.  Imaging to the visualized portions of the brain parenchyma reveals  no acute finding.  C2-3: No significant central canal narrowing. No significant neural  foraminal narrowing.  C3-4: Moderate central canal stenosis from disc osteophyte complex  causes some flattening of the cervical spinal cord. Moderate right  and mild left neural foraminal narrowing from disc osteophyte and  uncovertebral hypertrophy.  C4-5: Moderate central canal stenosis from disc osteophyte complex  causes some flattening of the cervical spinal cord. Moderate  bilateral neural foramen narrowing from disc osteophyte and  uncovertebral hypertrophy.  C5-6: Moderate central canal stenosis from disc osteophyte complex  causes some flattening of the cervical spinal cord.  Mild right and  moderate left neural foraminal narrowing from disc osteophyte and  uncovertebral hypertrophy.  C6-7: Mild central canal stenosis from disc osteophyte complex.  Mild  bilateral neural foraminal narrowing from disc osteophyte and  uncovertebral hypertrophy.  C7-T1: No significant central canal narrowing. No significant neural  foraminal narrowing.  IMPRESSION:  IMPRESSION  *Multilevel degenerative change is most notable at C3-4, C4-5, and  C5-6 where there is moderate central canal and neural foraminal  narrowing.  *Please see above for more details.  Electronically Signed    By: Bryon  Nastasi M.D.    On: 08/02/2023 10:06   3.  Hypertension 4.  Psoriatic arthritis she if followed by [facility] rheumatology on prednisone  5mg  daily 5.  Type 2 diabetes last A1c was 5.3 in December 2025 6.  Patient is on Xarelto  for history of DVT  Plan 1.  We will no prescribe any NSAIDs as patient is on Xarelto  2.  Continue with tylenol   3.  Continue with neurosurgery  4.  Continue with gabapentin  300mg  TID 5.  Continue with prednisone  5mg  daily 6.  Continue with exercises as learned in physical therapy at [facility] 2025 7.  Continue with Tramadol 50 mg 1 tablet twice daily as needed #45, 5 refills sent to pharmacy. 8.  Continue with ortho for knee pain 9.  Robaxin 500mg  1 tablet twice daily as needed #60 sent to pharmacy  10. We discussed trigger point injection she has significant anxiety around needles.  11.  She will follow with our office in 3 months   Social Drivers of Health with Concerns   Tobacco Use: Medium Risk (04/27/2024)   Patient History    Smoking Tobacco Use: Former    Smokeless Tobacco Use: Never  Physical Activity: Inactive (04/23/2024)   [facility]   Exercise Vital Sign    On average, how many days per week do you engage in moderate to strenuous exercise (like a brisk walk)?: 0 days    On average, how many minutes do you engage in  exercise at this level?: 0 min      Attestation Statement:   I personally performed the service, non-incident to. (WP)   WHITNEY MEELER, NP    BENTON DOWSE, NP  This note was generated in part with voice recognition software and I apologize for any typographical errors that were not detected and corrected.  PCP: Angeline Laura NP

## 2024-04-28 ENCOUNTER — Encounter: Payer: Self-pay | Admitting: Pulmonary Disease

## 2024-04-28 ENCOUNTER — Ambulatory Visit: Admitting: Pulmonary Disease

## 2024-04-28 VITALS — BP 132/82 | HR 71 | Temp 98.5°F | Ht 60.0 in | Wt 208.4 lb

## 2024-04-28 DIAGNOSIS — Z8542 Personal history of malignant neoplasm of other parts of uterus: Secondary | ICD-10-CM | POA: Diagnosis not present

## 2024-04-28 DIAGNOSIS — R911 Solitary pulmonary nodule: Secondary | ICD-10-CM

## 2024-04-28 DIAGNOSIS — Z87891 Personal history of nicotine dependence: Secondary | ICD-10-CM

## 2024-04-28 NOTE — Progress Notes (Signed)
 Synopsis: Referred in by Argentina Clap, MD   Subjective:   PATIENT ID: Melissa Cabrera GENDER: female DOB: August 20, 1955, MRN: [id number]  Chief Complaint  Patient presents with   Lung Mass    Nodule. DOE. No wheezing. No cough.     HPI Discussed the use of AI scribe software for clinical note transcription with the patient, who gave verbal consent to proceed.  History of Present Illness    Melissa Cabrera is a 68 year old female who presents for evaluation of lung nodules found incidentally on a cardiac scan.  She underwent a cardiac scan as part of pre-operative clearance for upcoming knee surgery, during which lung nodules were incidentally discovered. She experiences shortness of breath upon exertion but denies wheezing, chest tightness, or cough. There is no history of lung diseases such as asthma.  She has a significant smoking history, having smoked close to four packs a day until she quit in 1997. She started smoking at the age of 4 and quit in 1997. She has had COVID-19 twice.  Her past medical history includes endometrial cancer diagnosed in 2018 or 2019, for which she underwent surgery. She is up to date on her cancer screenings, including mammography and colonoscopy. She does not require Pap smears as her cervix was removed during surgery.  Family history is notable for her sister who died from liver cancer, her brother who has skin cancer, and her mother who had facial cancer.        Family History  Problem Relation Age of Onset   Cancer Mother        skin   Dementia Mother    Varicose Veins Mother    Cancer Sister        skin and liver   Stroke Maternal Grandmother    Skin cancer Brother    Cancer Brother    COPD Sister    Diabetes Daughter    Heart disease Neg Hx    Breast cancer Neg Hx      Social History   Socioeconomic History   Marital status: Married    Spouse name: Not on file   Number of children: Not on file   Years of education: Not on file    Highest education level: 12th grade  Occupational History   Not on file  Tobacco Use   Smoking status: Former    Current packs/day: 0.00    Average packs/day: 4.0 packs/day for 16.0 years (64.0 ttl pk-yrs)    Types: Cigarettes    Start date: 09/12/1979    Quit date: 09/12/1995    Years since quitting: 28.6   Smokeless tobacco: Never   Tobacco comments:    quit in 1997  Vaping Use   Vaping status: Never Used  Substance and Sexual Activity   Alcohol use: Never   Drug use: Never   Sexual activity: Yes    Birth control/protection: None  Other Topics Concern   Not on file  Social History Narrative   Not on file   Social Drivers of Health   Tobacco Use: Medium Risk (04/28/2024)   Patient History    Smoking Tobacco Use: Former    Smokeless Tobacco Use: Never    Passive Exposure: Not on Actuary Strain: Low Risk (04/17/2024)   Overall Financial Resource Strain (CARDIA)    Difficulty of Paying Living Expenses: Not hard at all  Food Insecurity: No Food Insecurity (04/23/2024)   Epic    Worried About Running Out of  Food in the Last Year: Never true    Ran Out of Food in the Last Year: Never true  Transportation Needs: No Transportation Needs (04/23/2024)   Epic    Lack of Transportation (Medical): No    Lack of Transportation (Non-Medical): No  Physical Activity: Inactive (04/23/2024)   Exercise Vital Sign    Days of Exercise per Week: 0 days    Minutes of Exercise per Session: 0 min  Stress: No Stress Concern Present (04/23/2024)   [employer] of Occupational Health - Occupational Stress Questionnaire    Feeling of Stress: Only a little  Social Connections: Moderately Integrated (04/23/2024)   Social Connection and Isolation Panel    Frequency of Communication with Friends and Family: More than three times a week    Frequency of Social Gatherings with Friends and Family: More than three times a week    Attends Religious Services: More than 4 times per  year    Active Member of Clubs or Organizations: No    Attends Banker Meetings: Never    Marital Status: Married  Catering Manager Violence: Not At Risk (04/23/2024)   Epic    Fear of Current or Ex-Partner: No    Emotionally Abused: No    Physically Abused: No    Sexually Abused: No  Depression (PHQ2-9): Low Risk (04/26/2024)   Depression (PHQ2-9)    PHQ-2 Score: 2  Alcohol Screen: Low Risk (04/18/2023)   Alcohol Screen    Last Alcohol Screening Score (AUDIT): 0  Housing: Unknown (04/23/2024)   Epic    Unable to Pay for Housing in the Last Year: No    Number of Times Moved in the Last Year: Not on file    Homeless in the Last Year: No  Utilities: Not At Risk (04/23/2024)   Epic    Threatened with loss of utilities: No  Health Literacy: Adequate Health Literacy (04/23/2024)   B1300 Health Literacy    Frequency of need for help with medical instructions: Never        Objective:   Vitals:   04/28/24 0859  BP: 132/82  Pulse: 71  Temp: 98.5 F (36.9 C)  SpO2: 100%  Weight: 208 lb 6.4 oz (94.5 kg)  Height: 5' (1.524 m)   100% on RA BMI Readings from Last 3 Encounters:  04/28/24 40.70 kg/m  04/26/24 40.93 kg/m  04/20/24 42.62 kg/m   Wt Readings from Last 3 Encounters:  04/28/24 208 lb 6.4 oz (94.5 kg)  04/26/24 209 lb 9.6 oz (95.1 kg)  04/20/24 211 lb (95.7 kg)    Physical Exam GEN: NAD, Healthy Appearing HEENT: Supple Neck, Reactive Pupils, EOMI  CVS: Normal S1, Normal S2, RRR, No murmurs or ES appreciated  Lungs: Clear bilateral air entry.  Abdomen: Soft, non tender, non distended, + BS  Extremities: Warm and well perfused, No edema   Labs and imaging were reviewed.   Ancillary Information   CBC    Component Value Date/Time   WBC 5.1 04/26/2024 1446   RBC 3.97 04/26/2024 1446   HGB 13.0 04/26/2024 1446   HGB 12.7 03/07/2022 0912   HCT 39.3 04/26/2024 1446   HCT 38.0 03/07/2022 0912   PLT 273 04/26/2024 1446   PLT 298 03/07/2022  0912   MCV 99.0 04/26/2024 1446   MCV 92 03/07/2022 0912   MCH 32.7 04/26/2024 1446   MCHC 33.1 04/26/2024 1446   RDW 13.7 04/26/2024 1446   RDW 14.3 03/07/2022 0912   LYMPHSABS 2.1  04/30/2019 1203   MONOABS 0.6 04/30/2019 1203   EOSABS 0.2 04/30/2019 1203   BASOSABS 0.0 04/30/2019 1203        No data to display           Assessment & Plan:  Assessment and Plan    #Pulmonary nodules Nodules likely benign due to small size and location. However with her smoking history and endometrial cancer I am inclined to obtain a CT chest at shorter interval. Largest nodule is 7 mm in the right lower lobe. MAYO 24.1 %.   - Order follow-up CT scan in 3 months to monitor nodule size and characteristics.      Return in about 3 months (around 07/27/2024) for with the CT chest. .  I personally spent a total of 60 minutes in the care of the patient today including preparing to see the patient, getting/reviewing separately obtained history, performing a medically appropriate exam/evaluation, counseling and educating, placing orders, documenting clinical information in the EHR, independently interpreting results, and communicating results.   Darrin Barn, MD [facility] 04/28/2024 9:31 AM

## 2024-05-10 ENCOUNTER — Ambulatory Visit: Admitting: Neurosurgery

## 2024-05-19 ENCOUNTER — Other Ambulatory Visit: Payer: Self-pay | Admitting: Internal Medicine

## 2024-05-20 NOTE — Telephone Encounter (Signed)
 Requested Prescriptions  Pending Prescriptions Disp Refills   metFORMIN  (GLUCOPHAGE -XR) 500 MG 24 hr tablet [Pharmacy Med Name: METFORMIN  HCL ER TABS 500MG ] 180 tablet 1    Sig: TAKE 1 TABLET TWICE A DAY WITH MEALS     Endocrinology:  Diabetes - Biguanides Failed - 05/20/2024 11:34 AM      Failed - Cr in normal range and within 360 days    Creat  Date Value Ref Range Status  04/26/2024 1.15 (H) 0.50 - 1.05 mg/dL Final   Creatinine, Urine  Date Value Ref Range Status  12/16/2023 192 20 - 275 mg/dL Final         Failed - eGFR in normal range and within 360 days    GFR calc Af Amer  Date Value Ref Range Status  04/30/2019 >60 >60 mL/min Final   GFR calc non Af Amer  Date Value Ref Range Status  04/30/2019 >60 >60 mL/min Final   GFR  Date Value Ref Range Status  12/21/2019 53.40 (L) >60.00 mL/min Final   eGFR  Date Value Ref Range Status  04/26/2024 52 (L) > OR = 60 mL/min/1.1m2 Final  06/07/2022 65 >59 mL/min/1.73 Final         Failed - B12 Level in normal range and within 720 days    No results found for: VITAMINB12       Failed - CBC within normal limits and completed in the last 12 months    WBC  Date Value Ref Range Status  04/26/2024 5.1 3.8 - 10.8 Thousand/uL Final   RBC  Date Value Ref Range Status  04/26/2024 3.97 3.80 - 5.10 Million/uL Final   Hemoglobin  Date Value Ref Range Status  04/26/2024 13.0 11.7 - 15.5 g/dL Final  89/73/7976 87.2 11.1 - 15.9 g/dL Final   HCT  Date Value Ref Range Status  04/26/2024 39.3 35.9 - 46.0 % Final   Hematocrit  Date Value Ref Range Status  03/07/2022 38.0 34.0 - 46.6 % Final   MCHC  Date Value Ref Range Status  04/26/2024 33.1 31.6 - 35.4 g/dL Final   [facility]  Date Value Ref Range Status  04/26/2024 32.7 27.0 - 33.0 pg Final   MCV  Date Value Ref Range Status  04/26/2024 99.0 81.4 - 101.7 fL Final  03/07/2022 92 79 - 97 fL Final   No results found for: PLTCOUNTKUC, LABPLAT, POCPLA RDW  Date Value  Ref Range Status  04/26/2024 13.7 11.0 - 15.0 % Final  03/07/2022 14.3 11.7 - 15.4 % Final         Passed - HBA1C is between 0 and 7.9 and within 180 days    Hgb A1c MFr Bld  Date Value Ref Range Status  04/26/2024 5.3 <5.7 % Final    Comment:    For the purpose of screening for the presence of diabetes: . <5.7%       Consistent with the absence of diabetes 5.7-6.4%    Consistent with increased risk for diabetes             (prediabetes) > or =6.5%  Consistent with diabetes . This assay result is consistent with a decreased risk of diabetes. . Currently, no consensus exists regarding use of hemoglobin A1c for diagnosis of diabetes in children. . According to American Diabetes Association (ADA) guidelines, hemoglobin A1c <7.0% represents optimal control in non-pregnant diabetic patients. Different metrics may apply to specific patient populations.  Standards of Medical Care in Diabetes(ADA). SABRA  Passed - Valid encounter within last 6 months    Recent Outpatient Visits           3 weeks ago Preoperative clearance   [place] [facility] [place], [place] W, NP   5 months ago Type 2 diabetes mellitus without complication, without long-term current use of insulin [facility])   [facility] [place], [place] W, NP   11 months ago Type 2 diabetes mellitus without complication, without long-term current use of insulin [facility])   [place] [facility] [place], [place], [place]

## 2024-05-21 ENCOUNTER — Other Ambulatory Visit: Payer: Self-pay

## 2024-05-21 ENCOUNTER — Encounter: Payer: Self-pay | Admitting: Internal Medicine

## 2024-05-21 MED ORDER — TIRZEPATIDE 7.5 MG/0.5ML ~~LOC~~ SOAJ: 7.5000 mg | SUBCUTANEOUS | 0 refills | Status: AC

## 2024-05-25 ENCOUNTER — Encounter (INDEPENDENT_AMBULATORY_CARE_PROVIDER_SITE_OTHER): Payer: Self-pay | Admitting: Vascular Surgery

## 2024-05-25 ENCOUNTER — Ambulatory Visit (INDEPENDENT_AMBULATORY_CARE_PROVIDER_SITE_OTHER): Payer: Self-pay | Admitting: Vascular Surgery

## 2024-05-25 VITALS — BP 117/79 | HR 85 | Resp 18 | Ht 61.0 in | Wt 201.0 lb

## 2024-05-25 DIAGNOSIS — Z86718 Personal history of other venous thrombosis and embolism: Secondary | ICD-10-CM | POA: Diagnosis not present

## 2024-05-25 DIAGNOSIS — E119 Type 2 diabetes mellitus without complications: Secondary | ICD-10-CM

## 2024-05-25 DIAGNOSIS — I10 Essential (primary) hypertension: Secondary | ICD-10-CM | POA: Diagnosis not present

## 2024-05-25 DIAGNOSIS — E785 Hyperlipidemia, unspecified: Secondary | ICD-10-CM | POA: Diagnosis not present

## 2024-05-25 DIAGNOSIS — E1169 Type 2 diabetes mellitus with other specified complication: Secondary | ICD-10-CM | POA: Diagnosis not present

## 2024-05-25 NOTE — Progress Notes (Signed)
 "   Patient ID: Melissa Cabrera, female   DOB: 07-04-1955, 69 y.o.   MRN: [id number]  Chief Complaint  Patient presents with   Establish Care    New patient consult evaluation for IVC filter before knee replacement, ref. Aberman    HPI Melissa Cabrera is a 69 y.o. female.  I am asked to see the patient by Dr. Lorelle for evaluation of IVC filter.   She presents with prior unprovoked lower extremity deep vein thrombosis presenting for preoperative evaluation for inferior vena cava filter placement prior to bilateral knee replacement surgery.  She had a lower extremity DVT about 7-8 years ago without a clear provoking factor and has had no recurrent DVT symptoms since.  She is scheduled for bilateral knee replacements, first on February 2nd, and denies current leg pain, swelling, headache, or visual changes.  She has intermittent groin rash with sweating that responds to topical powder. She has not had a rash for several weeks but is concerned it could recur around the time of surgery. Discussed the use of AI scribe software for clinical note transcription with the patient, who gave verbal consent to proceed.  History of Present Illness     Results   Past Medical History:  Diagnosis Date   Allergy    Arthritis    Cancer (HCC)    endometrial   CHF (congestive heart failure) (HCC)    Chicken pox    Diabetes mellitus without complication (HCC)    GERD (gastroesophageal reflux disease)    Heart murmur    DUE TO RHEUMATIC FEVER PER PT   Hyperlipidemia    Hypertension    Psoriasis    Rheumatic fever    Rosacea    Sleep apnea     Past Surgical History:  Procedure Laterality Date   ABDOMINAL HYSTERECTOMY     ANTERIOR AND POSTERIOR REPAIR N/A 12/04/2016   Procedure: ANTERIOR (CYSTOCELE) AND POSTERIOR REPAIR (RECTOCELE), TRANSOBTURATOR SLING PLACEMENT(ARIS);  Surgeon: Janit Alm Agent, MD;  Location: [facility] ORS;  Service: Gynecology;  Laterality: N/A;   COLONOSCOPY WITH PROPOFOL  N/A  05/15/2021   Procedure: COLONOSCOPY WITH PROPOFOL ;  Surgeon: Jinny Carmine, MD;  Location: [facility] ENDOSCOPY;  Service: Endoscopy;  Laterality: N/A;   DILATION AND CURETTAGE OF UTERUS N/A 09/16/2016   Procedure: DILATATION AND CURETTAGE;  Surgeon: Janit Alm Agent, MD;  Location: [facility] ORS;  Service: Gynecology;  Laterality: N/A;   LAPAROSCOPIC HYSTERECTOMY Bilateral 12/04/2016   Procedure: HYSTERECTOMY TOTAL LAPAROSCOPIC WITH BILATERAL SALPINGO OOPHERECTOMY;  Surgeon: Janit Alm Agent, MD;  Location: [facility] ORS;  Service: Gynecology;  Laterality: Bilateral;   MOUTH SURGERY       Family History  Problem Relation Age of Onset   Cancer Mother        skin   Dementia Mother    Varicose Veins Mother    Cancer Sister        skin and liver   Stroke Maternal Grandmother    Skin cancer Brother    Cancer Brother    COPD Sister    Diabetes Daughter    Heart disease Neg Hx    Breast cancer Neg Hx       Social History[1]   Allergies[2]  Current Outpatient Medications  Medication Sig Dispense Refill   acetaminophen  (TYLENOL ) 650 MG CR tablet Take 1,300 mg by mouth daily.     aspirin  EC 81 MG tablet Take 1 tablet (81 mg total) by mouth daily. Swallow whole.     busPIRone  (BUSPAR ) 5 MG  tablet TAKE 1 TABLET DAILY AS NEEDED 90 tablet 1   Cholecalciferol 25 MCG (1000 UT) tablet Take 1,000 Units by mouth daily.     doxycycline (ADOXA) 100 MG tablet Take 50 mg by mouth daily.     ezetimibe  (ZETIA ) 10 MG tablet TAKE 1 TABLET DAILY 90 tablet 1   fluticasone  (FLONASE ) 50 MCG/ACT nasal spray Place 2 sprays into both nostrils daily. 16 g 6   folic acid (FOLVITE) 1 MG tablet Take 1mg  by mouth Sunday to Thursday and 2mg  on Friday and Saturday     furosemide  (LASIX ) 40 MG tablet TAKE 1 TABLET TWICE A DAY 180 tablet 3   gabapentin  (NEURONTIN ) 300 MG capsule TAKE 2 CAPSULES THREE TIMES A DAY 540 capsule 1   Golimumab (SIMPONI ARIA IV) Inject into the vein. Reports receives via infusion from Rheumatology  clinic     hydroxychloroquine (PLAQUENIL) 200 MG tablet 1 tab Orally twice a day for 90 days     losartan  (COZAAR ) 50 MG tablet TAKE 1 TABLET DAILY 90 tablet 1   metFORMIN  (GLUCOPHAGE -XR) 500 MG 24 hr tablet TAKE 1 TABLET TWICE A DAY WITH MEALS 180 tablet 1   methotrexate (RHEUMATREX) 2.5 MG tablet Take 25 mg by mouth every Friday. Caution:Chemotherapy. Protect from light.     omeprazole  (PRILOSEC) 40 MG capsule TAKE 1 CAPSULE DAILY 90 capsule 1   rivaroxaban  (XARELTO ) 10 MG TABS tablet TAKE 1 TABLET DAILY 90 tablet 1   rosuvastatin  (CRESTOR ) 10 MG tablet TAKE 1 TABLET DAILY 90 tablet 1   tirzepatide  (MOUNJARO ) 7.5 MG/0.5ML Pen Inject 7.5 mg into the skin once a week. 6 mL 0   traMADol (ULTRAM) 50 MG tablet Take 50 mg by mouth 2 (two) times daily.     valACYclovir  (VALTREX ) 500 MG tablet 1 tab po daily. if flare, increase to 4 tabs at onset of symptoms and then 4 tabs 12 hours later. Then return to 1 tab po daily. 90 tablet 1   No current facility-administered medications for this visit.      REVIEW OF SYSTEMS (Negative unless checked)  Constitutional: [] Weight loss  [] Fever  [] Chills Cardiac: [] Chest pain   [] Chest pressure   [] Palpitations   [] Shortness of breath when laying flat   [] Shortness of breath at rest   [] Shortness of breath with exertion. Vascular:  [] Pain in legs with walking   [] Pain in legs at rest   [] Pain in legs when laying flat   [] Claudication   [] Pain in feet when walking  [] Pain in feet at rest  [] Pain in feet when laying flat   [x] History of DVT   [] Phlebitis   [x] Swelling in legs   [] Varicose veins   [] Non-healing ulcers Pulmonary:   [] Uses home oxygen   [] Productive cough   [] Hemoptysis   [] Wheeze  [] COPD   [] Asthma Neurologic:  [] Dizziness  [] Blackouts   [] Seizures   [] History of stroke   [] History of TIA  [] Aphasia   [] Temporary blindness   [] Dysphagia   [] Weakness or numbness in arms   [] Weakness or numbness in legs Musculoskeletal:  [x] Arthritis   [] Joint  swelling   [x] Joint pain   [] Low back pain Hematologic:  [] Easy bruising  [] Easy bleeding   [] Hypercoagulable state   [] Anemic  [] Hepatitis Gastrointestinal:  [] Blood in stool   [] Vomiting blood  [x] Gastroesophageal reflux/heartburn   [] Abdominal pain Genitourinary:  [] Chronic kidney disease   [] Difficult urination  [] Frequent urination  [] Burning with urination   [] Hematuria Skin:  [] Rashes   []   Ulcers   [] Wounds Psychological:  [] History of anxiety   []  History of major depression.    Physical Exam BP 117/79 (BP Location: Left Arm, Patient Position: Sitting, Cuff Size: Normal)   Pulse 85   Resp 18   Ht 5' 1 (1.549 m)   Wt 201 lb (91.2 kg)   BMI 37.98 kg/m  Gen:  WD/WN, NAD Head: [place]/AT, No temporalis wasting.  Ear/Nose/Throat: Hearing grossly intact, nares w/o erythema or drainage, oropharynx w/o Erythema/Exudate Eyes: Conjunctiva clear, sclera non-icteric  Neck: trachea midline.  No JVD.  Pulmonary:  Good air movement, respirations not labored, no use of accessory muscles  Cardiac: RRR, no JVD Vascular:  Vessel Right Left  Radial Palpable Palpable                                   Gastrointestinal:. No masses, surgical incisions, or scars. Musculoskeletal: M/S 5/5 throughout.  Extremities without ischemic changes.  No deformity or atrophy. Trace LE edema. Neurologic: Sensation grossly intact in extremities.  Symmetrical.  Speech is fluent. Motor exam as listed above. Psychiatric: Judgment intact, Mood & affect appropriate for pt's clinical situation. Dermatologic: No rashes or ulcers noted.  No cellulitis or open wounds.  Physical Exam   Radiology No results found.  Labs Recent Results (from the past 2160 hours)  NM PET CT CARDIAC PERFUSION MULTI W/ABSOLUTE BLOODFLOW     Status: None   Collection Time: 04/22/24 12:39 PM  Result Value Ref Range   Rest Nuclear Isotope Dose 25.1 mCi   Stress Nuclear Isotope Dose 25.0 mCi   Rest HR 75.0 bpm   Rest BP 120/61 mmHg    Peak HR 86 bpm   Peak BP 98/50 mmHg   SSS 4.0    SRS 0.0    TID 0.93    Nuc Stress EF 73 %   Nuc Rest EF 73 %   ST Depression (mm) 0 mm   Rest MBF 1.00 ml/g/min   Stress MBF 1.32 ml/g/min   MBFR 1.32   CBC     Status: None   Collection Time: 04/26/24  2:46 PM  Result Value Ref Range   WBC 5.1 3.8 - 10.8 Thousand/uL   RBC 3.97 3.80 - 5.10 Million/uL   Hemoglobin 13.0 11.7 - 15.5 g/dL   HCT 60.6 64.0 - 53.9 %   MCV 99.0 81.4 - 101.7 fL   MCH 32.7 27.0 - 33.0 pg   MCHC 33.1 31.6 - 35.4 g/dL   RDW 86.2 88.9 - 84.9 %   Platelets 273 140 - 400 Thousand/uL   MPV 9.7 7.5 - 12.5 fL  Comprehensive metabolic panel with GFR     Status: Abnormal   Collection Time: 04/26/24  2:46 PM  Result Value Ref Range   Glucose, Bld 85 65 - 99 mg/dL    Comment: .            Fasting reference interval .    BUN 15 7 - 25 mg/dL   Creat 8.84 (H) 9.49 - 1.05 mg/dL   eGFR 52 (L) > OR = 60 mL/min/1.79m2   BUN/Creatinine Ratio 13 6 - 22 (calc)   Sodium 141 135 - 146 mmol/L   Potassium 3.9 3.5 - 5.3 mmol/L   Chloride 102 98 - 110 mmol/L   CO2 29 20 - 32 mmol/L   Calcium  9.3 8.6 - 10.4 mg/dL   Total Protein 6.2 6.1 - 8.1  g/dL   Albumin 4.2 3.6 - 5.1 g/dL   Globulin 2.0 1.9 - 3.7 g/dL (calc)   AG Ratio 2.1 1.0 - 2.5 (calc)   Total Bilirubin 0.5 0.2 - 1.2 mg/dL   Alkaline phosphatase (APISO) 70 37 - 153 U/L   AST 26 10 - 35 U/L   ALT 17 6 - 29 U/L  Hemoglobin A1c     Status: None   Collection Time: 04/26/24  2:46 PM  Result Value Ref Range   Hgb A1c MFr Bld 5.3 <5.7 %    Comment: For the purpose of screening for the presence of diabetes: . <5.7%       Consistent with the absence of diabetes 5.7-6.4%    Consistent with increased risk for diabetes             (prediabetes) > or =6.5%  Consistent with diabetes . This assay result is consistent with a decreased risk of diabetes. . Currently, no consensus exists regarding use of hemoglobin A1c for diagnosis of diabetes in  children. . According to American Diabetes Association (ADA) guidelines, hemoglobin A1c <7.0% represents optimal control in non-pregnant diabetic patients. Different metrics may apply to specific patient populations.  Standards of Medical Care in Diabetes(ADA). .    Mean Plasma Glucose 105 mg/dL   eAG (mmol/L) 5.8 mmol/L    Assessment/Plan: Assessment & Plan   Planned inferior vena cava filter placement Scheduled for prophylactic IVC filter placement due to elevated perioperative venous thromboembolism risk from unprovoked DVT and upcoming bilateral knee arthroplasties. Procedure aims to prevent pulmonary embolism. Discussed risks and alternative management strategies. - Scheduled IVC filter placement for the last Monday in January, prior [facility]nd[facility] - Planned use of IV sedation and local anesthesia. - Provided anticipatory guidance on mild post-procedural soreness and groin bruising. - Discussed filter removal 1 month to 6 weeks postoperatively if no DVT is detected; if DVT is present, filter remains for 3 months. - Discussed leaving the filter in place between staged knee replacements if the second surgery is within 3 months. - Provided instructions on clothing and procedural logistics. - Addressed groin skin irritation concerns and discussed alternate access if needed.  History of deep vein thrombosis of lower extremity Unprovoked lower extremity DVT increases perioperative DVT/PE risk, justifying prophylactic IVC filter placement before orthopedic surgery. - Planned follow-up with lower extremity ultrasound 1 month to 6 weeks postoperatively to assess for new DVT before filter removal. - Discussed increased perioperative DVT/PE risk and rationale for IVC filter placement as prophylaxis.  Hyperlipidemia associated with type 2 diabetes mellitus (HCC) lipid control important in reducing the progression of atherosclerotic  disease. Continue statin therapy   Diabetes mellitus type 2, uncomplicated (HCC) blood glucose control important in reducing the progression of atherosclerotic disease. Also, involved in wound healing. On appropriate medications.   Hypertension blood pressure control important in reducing the progression of atherosclerotic disease. On appropriate oral medications.   Selinda Gu 05/25/2024, 9:44 AM   This note was created with Dragon medical transcription system.  Any errors from dictation are unintentional.       [1]  Social History Tobacco Use   Smoking status: Former    Current packs/day: 0.00    Average packs/day: 4.0 packs/day for 16.0 years (64.0 ttl pk-yrs)    Types: Cigarettes    Start date: 09/12/1979    Quit date: 09/12/1995    Years since quitting: 28.7   Smokeless tobacco:  Never   Tobacco comments:    quit in 1997  Vaping Use   Vaping status: Never Used  Substance Use Topics   Alcohol use: Never   Drug use: Never  [2] No Known Allergies  "

## 2024-05-25 NOTE — Assessment & Plan Note (Signed)
 blood pressure control important in reducing the progression of atherosclerotic disease. On appropriate oral medications.

## 2024-05-25 NOTE — H&P (View-Only) (Signed)
 "   Patient ID: Melissa Cabrera, female   DOB: 07-04-1955, 69 y.o.   MRN: [id number]  Chief Complaint  Patient presents with   Establish Care    New patient consult evaluation for IVC filter before knee replacement, ref. Aberman    HPI Melissa Cabrera is a 69 y.o. female.  I am asked to see the patient by Dr. Lorelle for evaluation of IVC filter.   She presents with prior unprovoked lower extremity deep vein thrombosis presenting for preoperative evaluation for inferior vena cava filter placement prior to bilateral knee replacement surgery.  She had a lower extremity DVT about 7-8 years ago without a clear provoking factor and has had no recurrent DVT symptoms since.  She is scheduled for bilateral knee replacements, first on February 2nd, and denies current leg pain, swelling, headache, or visual changes.  She has intermittent groin rash with sweating that responds to topical powder. She has not had a rash for several weeks but is concerned it could recur around the time of surgery. Discussed the use of AI scribe software for clinical note transcription with the patient, who gave verbal consent to proceed.  History of Present Illness     Results   Past Medical History:  Diagnosis Date   Allergy    Arthritis    Cancer (HCC)    endometrial   CHF (congestive heart failure) (HCC)    Chicken pox    Diabetes mellitus without complication (HCC)    GERD (gastroesophageal reflux disease)    Heart murmur    DUE TO RHEUMATIC FEVER PER PT   Hyperlipidemia    Hypertension    Psoriasis    Rheumatic fever    Rosacea    Sleep apnea     Past Surgical History:  Procedure Laterality Date   ABDOMINAL HYSTERECTOMY     ANTERIOR AND POSTERIOR REPAIR N/A 12/04/2016   Procedure: ANTERIOR (CYSTOCELE) AND POSTERIOR REPAIR (RECTOCELE), TRANSOBTURATOR SLING PLACEMENT(ARIS);  Surgeon: Janit Alm Agent, MD;  Location: [facility];  Service: Gynecology;  Laterality: N/A;   COLONOSCOPY WITH PROPOFOL  N/A  05/15/2021   Procedure: COLONOSCOPY WITH PROPOFOL ;  Surgeon: Jinny Carmine, MD;  Location: [facility];  Service: Endoscopy;  Laterality: N/A;   DILATION AND CURETTAGE OF UTERUS N/A 09/16/2016   Procedure: DILATATION AND CURETTAGE;  Surgeon: Janit Alm Agent, MD;  Location: [facility];  Service: Gynecology;  Laterality: N/A;   LAPAROSCOPIC HYSTERECTOMY Bilateral 12/04/2016   Procedure: HYSTERECTOMY TOTAL LAPAROSCOPIC WITH BILATERAL SALPINGO OOPHERECTOMY;  Surgeon: Janit Alm Agent, MD;  Location: [facility];  Service: Gynecology;  Laterality: Bilateral;   MOUTH SURGERY       Family History  Problem Relation Age of Onset   Cancer Mother        skin   Dementia Mother    Varicose Veins Mother    Cancer Sister        skin and liver   Stroke Maternal Grandmother    Skin cancer Brother    Cancer Brother    COPD Sister    Diabetes Daughter    Heart disease Neg Hx    Breast cancer Neg Hx       Social History[1]   Allergies[2]  Current Outpatient Medications  Medication Sig Dispense Refill   acetaminophen  (TYLENOL ) 650 MG CR tablet Take 1,300 mg by mouth daily.     aspirin  EC 81 MG tablet Take 1 tablet (81 mg total) by mouth daily. Swallow whole.     busPIRone  (BUSPAR ) 5 MG  tablet TAKE 1 TABLET DAILY AS NEEDED 90 tablet 1   Cholecalciferol 25 MCG (1000 UT) tablet Take 1,000 Units by mouth daily.     doxycycline (ADOXA) 100 MG tablet Take 50 mg by mouth daily.     ezetimibe  (ZETIA ) 10 MG tablet TAKE 1 TABLET DAILY 90 tablet 1   fluticasone  (FLONASE ) 50 MCG/ACT nasal spray Place 2 sprays into both nostrils daily. 16 g 6   folic acid (FOLVITE) 1 MG tablet Take 1mg  by mouth Sunday to Thursday and 2mg  on Friday and Saturday     furosemide  (LASIX ) 40 MG tablet TAKE 1 TABLET TWICE A DAY 180 tablet 3   gabapentin  (NEURONTIN ) 300 MG capsule TAKE 2 CAPSULES THREE TIMES A DAY 540 capsule 1   Golimumab (SIMPONI ARIA IV) Inject into the vein. Reports receives via infusion from Rheumatology  clinic     hydroxychloroquine (PLAQUENIL) 200 MG tablet 1 tab Orally twice a day for 90 days     losartan  (COZAAR ) 50 MG tablet TAKE 1 TABLET DAILY 90 tablet 1   metFORMIN  (GLUCOPHAGE -XR) 500 MG 24 hr tablet TAKE 1 TABLET TWICE A DAY WITH MEALS 180 tablet 1   methotrexate (RHEUMATREX) 2.5 MG tablet Take 25 mg by mouth every Friday. Caution:Chemotherapy. Protect from light.     omeprazole  (PRILOSEC) 40 MG capsule TAKE 1 CAPSULE DAILY 90 capsule 1   rivaroxaban  (XARELTO ) 10 MG TABS tablet TAKE 1 TABLET DAILY 90 tablet 1   rosuvastatin  (CRESTOR ) 10 MG tablet TAKE 1 TABLET DAILY 90 tablet 1   tirzepatide  (MOUNJARO ) 7.5 MG/0.5ML Pen Inject 7.5 mg into the skin once a week. 6 mL 0   traMADol (ULTRAM) 50 MG tablet Take 50 mg by mouth 2 (two) times daily.     valACYclovir  (VALTREX ) 500 MG tablet 1 tab po daily. if flare, increase to 4 tabs at onset of symptoms and then 4 tabs 12 hours later. Then return to 1 tab po daily. 90 tablet 1   No current facility-administered medications for this visit.      REVIEW OF SYSTEMS (Negative unless checked)  Constitutional: [] Weight loss  [] Fever  [] Chills Cardiac: [] Chest pain   [] Chest pressure   [] Palpitations   [] Shortness of breath when laying flat   [] Shortness of breath at rest   [] Shortness of breath with exertion. Vascular:  [] Pain in legs with walking   [] Pain in legs at rest   [] Pain in legs when laying flat   [] Claudication   [] Pain in feet when walking  [] Pain in feet at rest  [] Pain in feet when laying flat   [x] History of DVT   [] Phlebitis   [x] Swelling in legs   [] Varicose veins   [] Non-healing ulcers Pulmonary:   [] Uses home oxygen   [] Productive cough   [] Hemoptysis   [] Wheeze  [] COPD   [] Asthma Neurologic:  [] Dizziness  [] Blackouts   [] Seizures   [] History of stroke   [] History of TIA  [] Aphasia   [] Temporary blindness   [] Dysphagia   [] Weakness or numbness in arms   [] Weakness or numbness in legs Musculoskeletal:  [x] Arthritis   [] Joint  swelling   [x] Joint pain   [] Low back pain Hematologic:  [] Easy bruising  [] Easy bleeding   [] Hypercoagulable state   [] Anemic  [] Hepatitis Gastrointestinal:  [] Blood in stool   [] Vomiting blood  [x] Gastroesophageal reflux/heartburn   [] Abdominal pain Genitourinary:  [] Chronic kidney disease   [] Difficult urination  [] Frequent urination  [] Burning with urination   [] Hematuria Skin:  [] Rashes   []   Ulcers   [] Wounds Psychological:  [] History of anxiety   []  History of major depression.    Physical Exam BP 117/79 (BP Location: Left Arm, Patient Position: Sitting, Cuff Size: Normal)   Pulse 85   Resp 18   Ht 5' 1 (1.549 m)   Wt 201 lb (91.2 kg)   BMI 37.98 kg/m  Gen:  WD/WN, NAD Head: [place]/AT, No temporalis wasting.  Ear/Nose/Throat: Hearing grossly intact, nares w/o erythema or drainage, oropharynx w/o Erythema/Exudate Eyes: Conjunctiva clear, sclera non-icteric  Neck: trachea midline.  No JVD.  Pulmonary:  Good air movement, respirations not labored, no use of accessory muscles  Cardiac: RRR, no JVD Vascular:  Vessel Right Left  Radial Palpable Palpable                                   Gastrointestinal:. No masses, surgical incisions, or scars. Musculoskeletal: M/S 5/5 throughout.  Extremities without ischemic changes.  No deformity or atrophy. Trace LE edema. Neurologic: Sensation grossly intact in extremities.  Symmetrical.  Speech is fluent. Motor exam as listed above. Psychiatric: Judgment intact, Mood & affect appropriate for pt's clinical situation. Dermatologic: No rashes or ulcers noted.  No cellulitis or open wounds.  Physical Exam   Radiology No results found.  Labs Recent Results (from the past 2160 hours)  NM PET CT CARDIAC PERFUSION MULTI W/ABSOLUTE BLOODFLOW     Status: None   Collection Time: 04/22/24 12:39 PM  Result Value Ref Range   Rest Nuclear Isotope Dose 25.1 mCi   Stress Nuclear Isotope Dose 25.0 mCi   Rest HR 75.0 bpm   Rest BP 120/61 mmHg    Peak HR 86 bpm   Peak BP 98/50 mmHg   SSS 4.0    SRS 0.0    TID 0.93    Nuc Stress EF 73 %   Nuc Rest EF 73 %   ST Depression (mm) 0 mm   Rest MBF 1.00 ml/g/min   Stress MBF 1.32 ml/g/min   MBFR 1.32   CBC     Status: None   Collection Time: 04/26/24  2:46 PM  Result Value Ref Range   WBC 5.1 3.8 - 10.8 Thousand/uL   RBC 3.97 3.80 - 5.10 Million/uL   Hemoglobin 13.0 11.7 - 15.5 g/dL   HCT 60.6 64.0 - 53.9 %   MCV 99.0 81.4 - 101.7 fL   MCH 32.7 27.0 - 33.0 pg   MCHC 33.1 31.6 - 35.4 g/dL   RDW 86.2 88.9 - 84.9 %   Platelets 273 140 - 400 Thousand/uL   MPV 9.7 7.5 - 12.5 fL  Comprehensive metabolic panel with GFR     Status: Abnormal   Collection Time: 04/26/24  2:46 PM  Result Value Ref Range   Glucose, Bld 85 65 - 99 mg/dL    Comment: .            Fasting reference interval .    BUN 15 7 - 25 mg/dL   Creat 8.84 (H) 9.49 - 1.05 mg/dL   eGFR 52 (L) > OR = 60 mL/min/1.79m2   BUN/Creatinine Ratio 13 6 - 22 (calc)   Sodium 141 135 - 146 mmol/L   Potassium 3.9 3.5 - 5.3 mmol/L   Chloride 102 98 - 110 mmol/L   CO2 29 20 - 32 mmol/L   Calcium  9.3 8.6 - 10.4 mg/dL   Total Protein 6.2 6.1 - 8.1  g/dL   Albumin 4.2 3.6 - 5.1 g/dL   Globulin 2.0 1.9 - 3.7 g/dL (calc)   AG Ratio 2.1 1.0 - 2.5 (calc)   Total Bilirubin 0.5 0.2 - 1.2 mg/dL   Alkaline phosphatase (APISO) 70 37 - 153 U/L   AST 26 10 - 35 U/L   ALT 17 6 - 29 U/L  Hemoglobin A1c     Status: None   Collection Time: 04/26/24  2:46 PM  Result Value Ref Range   Hgb A1c MFr Bld 5.3 <5.7 %    Comment: For the purpose of screening for the presence of diabetes: . <5.7%       Consistent with the absence of diabetes 5.7-6.4%    Consistent with increased risk for diabetes             (prediabetes) > or =6.5%  Consistent with diabetes . This assay result is consistent with a decreased risk of diabetes. . Currently, no consensus exists regarding use of hemoglobin A1c for diagnosis of diabetes in  children. . According to American Diabetes Association (ADA) guidelines, hemoglobin A1c <7.0% represents optimal control in non-pregnant diabetic patients. Different metrics may apply to specific patient populations.  Standards of Medical Care in Diabetes(ADA). .    Mean Plasma Glucose 105 mg/dL   eAG (mmol/L) 5.8 mmol/L    Assessment/Plan: Assessment & Plan   Planned inferior vena cava filter placement Scheduled for prophylactic IVC filter placement due to elevated perioperative venous thromboembolism risk from unprovoked DVT and upcoming bilateral knee arthroplasties. Procedure aims to prevent pulmonary embolism. Discussed risks and alternative management strategies. - Scheduled IVC filter placement for the last Monday in January, prior to [facility]nd[facility] - Planned use of IV sedation and local anesthesia. - Provided anticipatory guidance on mild post-procedural soreness and groin bruising. - Discussed filter removal 1 month to 6 weeks postoperatively if no DVT is detected; if DVT is present, filter remains for 3 months. - Discussed leaving the filter in place between staged knee replacements if the second surgery is within 3 months. - Provided instructions on clothing and procedural logistics. - Addressed groin skin irritation concerns and discussed alternate access if needed.  History of deep vein thrombosis of lower extremity Unprovoked lower extremity DVT increases perioperative DVT/PE risk, justifying prophylactic IVC filter placement before orthopedic surgery. - Planned follow-up with lower extremity ultrasound 1 month to 6 weeks postoperatively to assess for new DVT before filter removal. - Discussed increased perioperative DVT/PE risk and rationale for IVC filter placement as prophylaxis.  Hyperlipidemia associated with type 2 diabetes mellitus (HCC) lipid control important in reducing the progression of atherosclerotic  disease. Continue statin therapy   Diabetes mellitus type 2, uncomplicated (HCC) blood glucose control important in reducing the progression of atherosclerotic disease. Also, involved in wound healing. On appropriate medications.   Hypertension blood pressure control important in reducing the progression of atherosclerotic disease. On appropriate oral medications.   Selinda Gu 05/25/2024, 9:44 AM   This note was created with Dragon medical transcription system.  Any errors from dictation are unintentional.       [1]  Social History Tobacco Use   Smoking status: Former    Current packs/day: 0.00    Average packs/day: 4.0 packs/day for 16.0 years (64.0 ttl pk-yrs)    Types: Cigarettes    Start date: 09/12/1979    Quit date: 09/12/1995    Years since quitting: 28.7   Smokeless tobacco:  Never   Tobacco comments:    quit in 1997  Vaping Use   Vaping status: Never Used  Substance Use Topics   Alcohol use: Never   Drug use: Never  [2] No Known Allergies  "

## 2024-05-25 NOTE — Assessment & Plan Note (Signed)
 lipid control important in reducing the progression of atherosclerotic disease. Continue statin therapy

## 2024-05-25 NOTE — Assessment & Plan Note (Signed)
 blood glucose control important in reducing the progression of atherosclerotic disease. Also, involved in wound healing. On appropriate medications.

## 2024-05-25 NOTE — Patient Instructions (Signed)
Inferior Vena Cava Filter Insertion  Inferior vena cava filter insertion is a procedure in which a filter is placed into the large vein in the abdomen that carries blood from the lower part of the body to the heart. This vein is called the inferior vena cava (IVC). The inserted filter helps to prevent blood clots in the legs or pelvis from traveling to the lungs, which can be very dangerous. The filter is a small, metal device that is shaped like the spokes of an umbrella. The filter is inserted through a pathway that is created in the neck or groin. Filters are inserted when blood thinners (anticoagulants) cannot be used to prevent blood clots from forming. You may need filters rather than anticoagulants if you have: Severe platelet problems or shortages. Recent or current major bleeding that cannot be treated. Bleeding associated with anticoagulants. Bleeding in your head. Recent surgery or impending surgery. Tell a health care provider about: Any allergies you have, including iodine. All medicines you are taking, including vitamins, herbs, eye drops, creams, and over-the-counter medicines. Any problems you or family members have had with anesthetic medicines or with contrast dyes that are used during imaging tests. Any bleeding problems you have. Any surgeries you have had. Any medical conditions you have. Whether you are pregnant or may be pregnant. What are the risks? Generally, this is a safe procedure. However, problems may occur, including: Filter problems such as: The filter blocking the inferior vena cava. This can cause leg swelling. The filter not working properly as it gets older. The filter moving and traveling to the heart or lungs. Allergic reactions to medicines or dyes. Damage to nearby structures or organs. Infection. Bleeding, such as a pool of blood (hematoma) around the site where a long, thin tube is put into a large vein (catheter insertion site). Blood clot in  lower extremities called a deep vein thrombosis (DVT). What happens before the procedure? When to stop eating and drinking Follow instructions from your health care provider about what you may eat and drink before your procedure. These may include: 8 hours before your procedure Stop eating most foods. Do not eat meat, fried foods, or fatty foods. Eat only light foods, such as toast or crackers. All liquids are okay except energy drinks and alcohol. 6 hours before your procedure Stop eating. Drink only clear liquids, such as water, clear fruit juice, black coffee, plain tea, and sports drinks. Do not drink energy drinks or alcohol. 2 hours before your procedure Stop drinking all liquids. You may be allowed to take medicines with small sips of water. If you do not follow your health care provider's instructions, your procedure may be delayed or canceled. Medicines Ask your health care provider about: Changing or stopping your regular medicines. This is especially important if you are taking diabetes medicines or blood thinners. Taking medicines such as aspirin and ibuprofen. These medicines can thin your blood. Do not take these medicines unless your health care provider tells you to take them. Taking over-the-counter medicines, vitamins, herbs, and supplements. Surgery safety Ask your health care provider: How your surgery site will be marked. What steps will be taken to help prevent infection. These steps may include: Removing hair at the surgery site. Washing skin with a germ-killing soap. Receiving antibiotic medicine. General instructions You may have blood tests. These tests can help to tell how well your kidneys and liver are working. They can also show how fast your blood is clotting. Do not use any  products that contain nicotine or tobacco for at least 4 weeks before the procedure. These products include cigarettes, chewing tobacco, and vaping devices, such as e-cigarettes. If  you need help quitting, ask your health care provider. If you will be going home right after the procedure, plan to have a responsible adult: Take you home from the hospital or clinic. You will not be allowed to drive. Care for you for the time you are told. What happens during the procedure? An IV will be inserted into one of your veins. You will be given one or more of the following: A medicine to help you relax (sedative). A medicine to numb the area (local anesthetic). A medicine to make you fall asleep (general anesthetic). The procedure is done through a large vein in your neck or groin that leads to the IVC. An incision will be made over the vein. A long, thin tube (catheter) will be put into the vein. Contrast dye may be injected into the IVC to help guide the catheter. X-rays may be used to make sure that the catheter is in the correct position. The IVC filter will be inserted into the vein through the catheter until it reaches the correct location in the inferior vena cava. The catheter will be removed through the insertion site in your skin. Pressure will be applied to the insertion site to stop bleeding. A bandage (dressing) may be applied over the catheter insertion site. Your IV will be taken out. The procedure may vary among health care providers and hospitals. What happens after the procedure? Your blood pressure, heart rate, breathing rate, and blood oxygen level will be monitored until you leave the hospital or clinic. You may need to stay in bed (be on bed rest) for a period of time. Your insertion site will be monitored for the first few hours for any signs of bleeding. If you were given a sedative during the procedure, it can affect you for several hours. Do not drive or operate machinery until your health care provider says that it is safe. Summary The inferior vena cava filter helps to prevent blood clots in the legs or pelvis from traveling to your lungs. The IVC  filter is inserted when anticoagulants cannot be used to prevent blood clots. Plan to have a responsible adult care for you for the time you are told. This information is not intended to replace advice given to you by your health care provider. Make sure you discuss any questions you have with your health care provider. Document Revised: 05/15/2021 Document Reviewed: 05/15/2021 Elsevier Patient Education  2024 [employer].

## 2024-05-26 ENCOUNTER — Ambulatory Visit (INDEPENDENT_AMBULATORY_CARE_PROVIDER_SITE_OTHER): Admitting: Neurosurgery

## 2024-05-26 ENCOUNTER — Encounter: Payer: Self-pay | Admitting: Neurosurgery

## 2024-05-26 ENCOUNTER — Telehealth (INDEPENDENT_AMBULATORY_CARE_PROVIDER_SITE_OTHER): Payer: Self-pay

## 2024-05-26 VITALS — BP 126/76 | Ht 61.0 in | Wt 196.0 lb

## 2024-05-26 DIAGNOSIS — M48061 Spinal stenosis, lumbar region without neurogenic claudication: Secondary | ICD-10-CM

## 2024-05-26 DIAGNOSIS — M4316 Spondylolisthesis, lumbar region: Secondary | ICD-10-CM

## 2024-05-26 DIAGNOSIS — M48062 Spinal stenosis, lumbar region with neurogenic claudication: Secondary | ICD-10-CM | POA: Diagnosis not present

## 2024-05-26 NOTE — Telephone Encounter (Signed)
 Spoke with the patient and she is scheduled with Dr. Marea for a IVC filter removal on 06/07/24 with a 8:00 am arrival time to the [facility] Pre-procedure instructions were discussed and will be sent to Mychart and mailed.

## 2024-05-26 NOTE — Progress Notes (Signed)
 "   Referring Physician:  Antonette Angeline ORN, NP [address] [place],  [place] [address]  Primary Physician:  Antonette Angeline ORN, NP  Discussed the use of AI scribe software for clinical note transcription with the patient, who gave verbal consent to proceed.  History of Present Illness Melissa Cabrera is a 69 year old female with lumbar spondylolisthesis who presents for follow-up of persistent low back and right buttock pain after lumbar spine injection.  She has chronic low back pain with tightness and persistent discomfort across the right buttock, intermittently radiating down the right leg. Pain is sometimes severe, difficult to localize, and only partially improved after the recent lumbar injection. Symptoms are worsened by ambulation and activities of daily living, and she now needs caregiver support for walking. Pain occurs with both left and right steps. She denies new neurological deficits or change in leg function since the last visit.  She has completed two courses of physical therapy and recently received a lumbar spine injection with partial but incomplete relief. Recent lumbar imaging, including flexion-extension x-rays, was obtained.  She is preparing for a second knee replacement and is not in physical therapy currently. She has lost weight from 256 lbs to 196 lbs with Mounjaro  but without improvement in joint pain. Ongoing knee pathology limits her energy.  Review of Systems:  A 10 point review of systems is negative, except for the pertinent positives and negatives detailed in the HPI.  Past Medical History: Past Medical History:  Diagnosis Date   Allergy    Arthritis    Cancer (HCC)    endometrial   CHF (congestive heart failure) (HCC)    Chicken pox    Diabetes mellitus without complication (HCC)    GERD (gastroesophageal reflux disease)    Heart murmur    DUE TO RHEUMATIC FEVER PER PT   Hyperlipidemia    Hypertension    Psoriasis    Rheumatic fever    Rosacea    Sleep  apnea     Past Surgical History: Past Surgical History:  Procedure Laterality Date   ABDOMINAL HYSTERECTOMY     ANTERIOR AND POSTERIOR REPAIR N/A 12/04/2016   Procedure: ANTERIOR (CYSTOCELE) AND POSTERIOR REPAIR (RECTOCELE), TRANSOBTURATOR SLING PLACEMENT(ARIS);  Surgeon: Janit Alm Agent, MD;  Location: [facility];  Service: Gynecology;  Laterality: N/A;   COLONOSCOPY WITH PROPOFOL  N/A 05/15/2021   Procedure: COLONOSCOPY WITH PROPOFOL ;  Surgeon: Jinny Carmine, MD;  Location: [facility];  Service: Endoscopy;  Laterality: N/A;   DILATION AND CURETTAGE OF UTERUS N/A 09/16/2016   Procedure: DILATATION AND CURETTAGE;  Surgeon: Janit Alm Agent, MD;  Location: [facility];  Service: Gynecology;  Laterality: N/A;   LAPAROSCOPIC HYSTERECTOMY Bilateral 12/04/2016   Procedure: HYSTERECTOMY TOTAL LAPAROSCOPIC WITH BILATERAL SALPINGO OOPHERECTOMY;  Surgeon: Janit Alm Agent, MD;  Location: [facility];  Service: Gynecology;  Laterality: Bilateral;   MOUTH SURGERY      Allergies: Allergies as of 05/26/2024   (No Known Allergies)    Medications:  Current Outpatient Medications:    acetaminophen  (TYLENOL ) 650 MG CR tablet, Take 1,300 mg by mouth daily., Disp: , Rfl:    busPIRone  (BUSPAR ) 5 MG tablet, TAKE 1 TABLET DAILY AS NEEDED, Disp: 90 tablet, Rfl: 1   Cholecalciferol 25 MCG (1000 UT) tablet, Take 1,000 Units by mouth daily., Disp: , Rfl:    doxycycline (ADOXA) 100 MG tablet, Take 50 mg by mouth daily., Disp: , Rfl:    ezetimibe  (ZETIA ) 10 MG tablet, TAKE 1 TABLET DAILY,  Disp: 90 tablet, Rfl: 1   fluticasone  (FLONASE ) 50 MCG/ACT nasal spray, Place 2 sprays into both nostrils daily., Disp: 16 g, Rfl: 6   folic acid (FOLVITE) 1 MG tablet, Take 1mg  by mouth Sunday to Thursday and 2mg  on Friday and Saturday, Disp: , Rfl:    furosemide  (LASIX ) 40 MG tablet, TAKE 1 TABLET TWICE A DAY, Disp: 180 tablet, Rfl: 3   gabapentin  (NEURONTIN ) 300 MG capsule, TAKE 2 CAPSULES THREE TIMES A DAY, Disp: 540 capsule,  Rfl: 1   hydroxychloroquine (PLAQUENIL) 200 MG tablet, 1 tab Orally twice a day for 90 days, Disp: , Rfl:    losartan  (COZAAR ) 50 MG tablet, TAKE 1 TABLET DAILY, Disp: 90 tablet, Rfl: 1   metFORMIN  (GLUCOPHAGE -XR) 500 MG 24 hr tablet, TAKE 1 TABLET TWICE A DAY WITH MEALS, Disp: 180 tablet, Rfl: 1   methotrexate (RHEUMATREX) 2.5 MG tablet, Take 25 mg by mouth every Friday. Caution:Chemotherapy. Protect from light., Disp: , Rfl:    omeprazole  (PRILOSEC) 40 MG capsule, TAKE 1 CAPSULE DAILY, Disp: 90 capsule, Rfl: 1   rivaroxaban  (XARELTO ) 10 MG TABS tablet, TAKE 1 TABLET DAILY, Disp: 90 tablet, Rfl: 1   rosuvastatin  (CRESTOR ) 10 MG tablet, TAKE 1 TABLET DAILY, Disp: 90 tablet, Rfl: 1   tirzepatide  (MOUNJARO ) 7.5 MG/0.5ML Pen, Inject 7.5 mg into the skin once a week., Disp: 6 mL, Rfl: 0   traMADol (ULTRAM) 50 MG tablet, Take 50 mg by mouth 2 (two) times daily., Disp: , Rfl:    valACYclovir  (VALTREX ) 500 MG tablet, 1 tab po daily. if flare, increase to 4 tabs at onset of symptoms and then 4 tabs 12 hours later. Then return to 1 tab po daily., Disp: 90 tablet, Rfl: 1  Social History: Social History   Tobacco Use   Smoking status: Former    Current packs/day: 0.00    Average packs/day: 4.0 packs/day for 16.0 years (64.0 ttl pk-yrs)    Types: Cigarettes    Start date: 09/12/1979    Quit date: 09/12/1995    Years since quitting: 28.7   Smokeless tobacco: Never   Tobacco comments:    quit in 1997  Vaping Use   Vaping status: Never Used  Substance Use Topics   Alcohol use: Never   Drug use: Never    Family Medical History: Family History  Problem Relation Age of Onset   Cancer Mother        skin   Dementia Mother    Varicose Veins Mother    Cancer Sister        skin and liver   Stroke Maternal Grandmother    Skin cancer Brother    Cancer Brother    COPD Sister    Diabetes Daughter    Heart disease Neg Hx    Breast cancer Neg Hx     Physical Examination: Vitals:   05/26/24 0941   BP: 126/76    General: Patient is in no apparent distress. Attention to examination is appropriate.  Neck:   Supple.  Full range of motion.  Respiratory: Patient is breathing without any difficulty.   NEUROLOGICAL:   Strength: No major deficits noted.   Reflexes are mostly 2+ throughout, possible Hoffmann sign possible spreading at her brachial radialis.   Imaging: Narrative & Impression  CLINICAL DATA:  Bilateral leg pain.   EXAM: MRI LUMBAR SPINE WITHOUT CONTRAST   TECHNIQUE: Multiplanar, multisequence MR imaging of the lumbar spine was performed. No intravenous contrast was administered.   COMPARISON:  Lumbar MRI 04/23/2018.   FINDINGS: FINDINGS For the purposes of this dictation, the L5-S1 disc space is on image 27, series 21.   Grade 1 anterolisthesis of L4 on L5 has increased and now measures roughly 6.5 mm (previously this measured roughly 3 mm).   No significant vertebral body height loss.   Multilevel disc space narrowing has increased and is now severe at L5-S1.   There is disc desiccation to varying degrees at every level   Bone marrow endplate edematous change is noted at L5-S1.   Conus ends at the upper L1 level.   Mild degenerative change of the partially imaged sacroiliac joints bilaterally.   T12-L1 : Small broad-based disc bulge causes no significant central canal narrowing. No significant neural foraminal narrowing. Mild bilateral facet degenerative change.   L1-2: No significant central canal narrowing. No significant neural foraminal narrowing. Moderate bilateral facet degenerative change.   L2-3: Mild-moderate central canal stenosis from broad-based disc bulge is new. Moderate bilateral neural foramen narrowing is new. Insert effusions   L3-4: Small broad-based disc bulge causes no significant central canal narrowing. Mild right neural foramen narrowing is new. No significant left neural foraminal narrowing. Mild bilateral  facet degenerative change.   L4-5: Severe central canal stenosis from unroofing of disc material is increased there is superimposed severe left subarticular recess narrowing from left paracentral disc extrusion which is new. Severe right and moderate left neural foramen narrowing has increased. Severe bilateral facet degenerative change.   L5-S1: No significant central canal narrowing. Mild right and moderate left neural foramen narrowing is unchanged significantly. Mild bilateral facet degenerative change.   IMPRESSION: IMPRESSION *Multilevel degenerative change as increased a few levels and is most notable at L4-5 where there is severe central canal and left subarticular recess narrowing. *Please see above for more details.     Electronically Signed   By: Bryon  Nastasi M.D.   On: 08/02/2023 10:13   Narrative & Impression  CLINICAL DATA:  Right lateral neck pain intermittent x2 years.   EXAM: MRI CERVICAL SPINE WITHOUT CONTRAST   TECHNIQUE: Multiplanar, multisequence MR imaging of the cervical spine was performed. No intravenous contrast was administered.   COMPARISON:  None Available.   FINDINGS: Straightening of the normal cervical lordosis with mild reversal could relate to muscle spasm or could be positional.   No prevertebral soft tissue swelling.   No significant listhesis.   No significant vertebral body height loss.   Multilevel moderate disc space narrowing from C3-4 through C6-7 and also at T1-2.   Cervical spinal cord is normal in signal intensity.   Bone marrow signal is mildly heterogeneous.   Imaging to the visualized portions of the brain parenchyma reveals no acute finding.   C2-3: No significant central canal narrowing. No significant neural foraminal narrowing.   C3-4: Moderate central canal stenosis from disc osteophyte complex causes some flattening of the cervical spinal cord. Moderate right and mild left neural foraminal narrowing  from disc osteophyte and uncovertebral hypertrophy.   C4-5: Moderate central canal stenosis from disc osteophyte complex causes some flattening of the cervical spinal cord. Moderate bilateral neural foramen narrowing from disc osteophyte and uncovertebral hypertrophy.   C5-6: Moderate central canal stenosis from disc osteophyte complex causes some flattening of the cervical spinal cord. Mild right and moderate left neural foraminal narrowing from disc osteophyte and uncovertebral hypertrophy.   C6-7: Mild central canal stenosis from disc osteophyte complex. Mild bilateral neural foraminal narrowing from disc osteophyte and uncovertebral hypertrophy.  C7-T1: No significant central canal narrowing. No significant neural foraminal narrowing.   IMPRESSION: IMPRESSION *Multilevel degenerative change is most notable at C3-4, C4-5, and C5-6 where there is moderate central canal and neural foraminal narrowing. *Please see above for more details.     Electronically Signed   By: Bryon  Nastasi M.D.   On: 08/02/2023 10:06      I have personally reviewed the images and agree with the above interpretation.  Assessment and Plan Assessment & Plan Lumbar spondylolisthesis at L4-5 with neurogenic symptoms Chronic grade 1 lumbar spondylolisthesis at L4-5 with persistent neurogenic symptoms, including right buttock and intermittent leg pain, significantly limiting ambulation and daily activities. Imaging demonstrates no instability on flexion-extension x-rays. Symptoms have partially improved following recent lumbar injection but remain functionally significant. Bilateral knee pathology may be contributing to her overall limitation. Recent weight loss is expected to benefit both knee and lumbar outcomes. - Deferred further spine intervention until after recovery from planned bilateral knee replacements, as knee pathology may be contributing to functional limitation. - Advised follow-up in  August, approximately eight months post-knee surgeries, to reassess lumbar symptoms and functional status. - Recommended earlier evaluation if knee recovery is expedited or if lumbar symptoms become intolerable. - Discussed surgical decompression as a reasonable option if neurogenic symptoms persist after knee recovery, with the goal of improving neurogenic symptoms and function but not eliminating all back pain. - Provided anticipatory guidance that spine surgery is unlikely to resolve all arthritic pain but would address nerve compression. - Encouraged continued weight management, as recent weight loss will benefit both knee and lumbar outcomes.    Penne MICAEL Sharps MD/MSCR Neurosurgery  "

## 2024-05-27 ENCOUNTER — Other Ambulatory Visit: Payer: Self-pay | Admitting: Internal Medicine

## 2024-05-27 NOTE — Telephone Encounter (Signed)
 Requested Prescriptions  Pending Prescriptions Disp Refills   rosuvastatin  (CRESTOR ) 10 MG tablet [Pharmacy Med Name: ROSUVASTATIN  TABS 10MG ] 90 tablet 1    Sig: TAKE 1 TABLET DAILY     Cardiovascular:  Antilipid - Statins 2 Failed - 05/27/2024  4:33 PM      Failed - Cr in normal range and within 360 days    Creat  Date Value Ref Range Status  04/26/2024 1.15 (H) 0.50 - 1.05 mg/dL Final   Creatinine, Urine  Date Value Ref Range Status  12/16/2023 192 20 - 275 mg/dL Final         Failed - Lipid Panel in normal range within the last 12 months    Cholesterol, Total  Date Value Ref Range Status  06/07/2022 144 100 - 199 mg/dL Final   Cholesterol  Date Value Ref Range Status  12/16/2023 95 <200 mg/dL Final   LDL Cholesterol (Calc)  Date Value Ref Range Status  12/16/2023 13 mg/dL (calc) Final    Comment:    Reference range: <100 . Desirable range <100 mg/dL for primary prevention;   <70 mg/dL for patients with CHD or diabetic patients  with > or = 2 CHD risk factors. SABRA LDL-C is now calculated using the Martin-Hopkins  calculation, which is a validated novel method providing  better accuracy than the Friedewald equation in the  estimation of LDL-C.  Gladis APPLETHWAITE et al. SANDREA. 7986;689(80): 2061-2068  (http://education.[facility].com/faq/FAQ164)    Direct LDL  Date Value Ref Range Status  12/16/2022 26 <100 mg/dL Final    Comment:    Greatly elevated Triglycerides values (>1200 mg/dL) interfere with the dLDL assay. SABRA Desirable range <100 mg/dL for primary prevention;   <70 mg/dL for patients with CHD or diabetic patients  with > or = 2 CHD risk factors. SABRA    HDL  Date Value Ref Range Status  12/16/2023 64 > OR = 50 mg/dL Final  98/73/7975 58 >60 mg/dL Final   Triglycerides  Date Value Ref Range Status  12/16/2023 99 <150 mg/dL Final         Passed - Patient is not pregnant      Passed - Valid encounter within last 12 months    Recent Outpatient Visits            1 month ago Preoperative clearance    [facility] [place], [place], NP   5 months ago Type 2 diabetes mellitus without complication, without long-term current use of insulin [facility])    [facility] [place], [place], NP   11 months ago Type 2 diabetes mellitus without complication, without long-term current use of insulin (HCC)    [facility] [place], Angeline ORN, NP               XARELTO  10 MG TABS tablet [Pharmacy Med Name: XARELTO  TABS 10MG ] 90 tablet 3    Sig: TAKE 1 TABLET DAILY     Hematology: Anticoagulants - rivaroxaban  Failed - 05/27/2024  4:33 PM      Failed - Cr in normal range and within 360 days    Creat  Date Value Ref Range Status  04/26/2024 1.15 (H) 0.50 - 1.05 mg/dL Final   Creatinine, Urine  Date Value Ref Range Status  12/16/2023 192 20 - 275 mg/dL Final         Passed - ALT in normal range and within 360 days    ALT  Date Value  Ref Range Status  04/26/2024 17 6 - 29 U/L Final         Passed - AST in normal range and within 360 days    AST  Date Value Ref Range Status  04/26/2024 26 10 - 35 U/L Final         Passed - HCT in normal range and within 360 days    HCT  Date Value Ref Range Status  04/26/2024 39.3 35.9 - 46.0 % Final   Hematocrit  Date Value Ref Range Status  03/07/2022 38.0 34.0 - 46.6 % Final         Passed - HGB in normal range and within 360 days    Hemoglobin  Date Value Ref Range Status  04/26/2024 13.0 11.7 - 15.5 g/dL Final  89/73/7976 87.2 11.1 - 15.9 g/dL Final         Passed - PLT in normal range and within 360 days    Platelets  Date Value Ref Range Status  04/26/2024 273 140 - 400 Thousand/uL Final  03/07/2022 298 150 - 450 x10E3/uL Final         Passed - eGFR is 15 or above and within 360 days    GFR calc Af Amer  Date Value Ref Range Status  04/30/2019 >60 >60 mL/min Final   GFR calc non Af Amer  Date Value Ref Range  Status  04/30/2019 >60 >60 mL/min Final   GFR  Date Value Ref Range Status  12/21/2019 53.40 (L) >60.00 mL/min Final   eGFR  Date Value Ref Range Status  04/26/2024 52 (L) > OR = 60 mL/min/1.21m2 Final  06/07/2022 65 >59 mL/min/1.73 Final         Passed - Patient is not pregnant      Passed - Valid encounter within last 12 months    Recent Outpatient Visits           1 month ago Preoperative clearance   [facility] [place], Angeline ORN, NP   5 months ago Type 2 diabetes mellitus without complication, without long-term current use of insulin [facility])   [facility] [place], [place], NP   11 months ago Type 2 diabetes mellitus without complication, without long-term current use of insulin [facility])   [place] [facility] [place], Angeline ORN, NP               omeprazole  (PRILOSEC) 40 MG capsule [Pharmacy Med Name: OMEPRAZOLE  DR CAPS 40MG ] 90 capsule 3    Sig: TAKE 1 CAPSULE DAILY     Gastroenterology: Proton Pump Inhibitors Passed - 05/27/2024  4:33 PM      Passed - Valid encounter within last 12 months    Recent Outpatient Visits           1 month ago Preoperative clearance   [place] [facility] [place], [place], NP   5 months ago Type 2 diabetes mellitus without complication, without long-term current use of insulin [facility])   [place] [facility] [place], [place], NP   11 months ago Type 2 diabetes mellitus without complication, without long-term current use of insulin [facility])   [place] [facility] [place], Angeline ORN, NP               ezetimibe  (ZETIA ) 10 MG tablet [Pharmacy Med Name: EZETIMIBE  TABS 10MG ] 90 tablet 1    Sig: TAKE 1 TABLET DAILY  Cardiovascular:  Antilipid - Sterol Transport Inhibitors Failed - 05/27/2024  4:33 PM      Failed - Lipid Panel in normal range within the last 12 months    Cholesterol, Total  Date Value Ref Range Status   06/07/2022 144 100 - 199 mg/dL Final   Cholesterol  Date Value Ref Range Status  12/16/2023 95 <200 mg/dL Final   LDL Cholesterol (Calc)  Date Value Ref Range Status  12/16/2023 13 mg/dL (calc) Final    Comment:    Reference range: <100 . Desirable range <100 mg/dL for primary prevention;   <70 mg/dL for patients with CHD or diabetic patients  with > or = 2 CHD risk factors. SABRA LDL-C is now calculated using the Martin-Hopkins  calculation, which is a validated novel method providing  better accuracy than the Friedewald equation in the  estimation of LDL-C.  Gladis APPLETHWAITE et al. SANDREA. 7986;689(80): 2061-2068  (http://education.[facility].com/faq/FAQ164)    Direct LDL  Date Value Ref Range Status  12/16/2022 26 <100 mg/dL Final    Comment:    Greatly elevated Triglycerides values (>1200 mg/dL) interfere with the dLDL assay. SABRA Desirable range <100 mg/dL for primary prevention;   <70 mg/dL for patients with CHD or diabetic patients  with > or = 2 CHD risk factors. SABRA    HDL  Date Value Ref Range Status  12/16/2023 64 > OR = 50 mg/dL Final  98/73/7975 58 >60 mg/dL Final   Triglycerides  Date Value Ref Range Status  12/16/2023 99 <150 mg/dL Final         Passed - AST in normal range and within 360 days    AST  Date Value Ref Range Status  04/26/2024 26 10 - 35 U/L Final         Passed - ALT in normal range and within 360 days    ALT  Date Value Ref Range Status  04/26/2024 17 6 - 29 U/L Final         Passed - Patient is not pregnant      Passed - Valid encounter within last 12 months    Recent Outpatient Visits           1 month ago Preoperative clearance   [place] [place], [place], NP   5 months ago Type 2 diabetes mellitus without complication, without long-term current use of insulin [facility])   [place] [facility] [place], [place], NP   11 months ago Type 2 diabetes mellitus without complication,  without long-term current use of insulin [facility])   [place] [facility] [place], Angeline ORN, NP               busPIRone  (BUSPAR ) 5 MG tablet [Pharmacy Med Name: BUSPIRONE  HCL TABS 5MG ] 90 tablet 3    Sig: TAKE 1 TABLET DAILY AS NEEDED     Psychiatry: Anxiolytics/Hypnotics - Non-controlled Passed - 05/27/2024  4:33 PM      Passed - Valid encounter within last 12 months    Recent Outpatient Visits           1 month ago Preoperative clearance   [place] [facility] [place], [place], NP   5 months ago Type 2 diabetes mellitus without complication, without long-term current use of insulin [facility])   [place] [facility] [place], [place], NP   11 months ago Type 2 diabetes mellitus without complication, without long-term current  use of insulin [facility])   [place] [facility] [place], Angeline ORN, [place]

## 2024-05-31 ENCOUNTER — Other Ambulatory Visit: Payer: Self-pay | Admitting: Orthopedic Surgery

## 2024-06-01 ENCOUNTER — Other Ambulatory Visit (INDEPENDENT_AMBULATORY_CARE_PROVIDER_SITE_OTHER): Payer: Self-pay | Admitting: Nurse Practitioner

## 2024-06-03 ENCOUNTER — Telehealth: Payer: Self-pay

## 2024-06-03 NOTE — Patient Instructions (Signed)
 Your procedure is scheduled on:06-14-24 Monday Report to the Registration Desk on the 1st floor of the [facility].Then proceed to the 2nd floor Surgery Desk To find out your arrival time, please call [phone] between 1PM - 3PM on:06-11-24 Friday If your arrival time is 6:00 am, do not arrive before that time as the [facility] entrance doors do not open until 6:00 am.  REMEMBER: Instructions that are not followed completely may result in serious medical risk, up to and including death; or upon the discretion of your surgeon and anesthesiologist your surgery may need to be rescheduled.  Do not eat food after midnight the night before surgery.  No gum chewing or hard candies.  You may however, drink Water up to 2 hours before you are scheduled to arrive for your surgery. Do not drink anything within 2 hours of your scheduled arrival time.  In addition, your doctor has ordered for you to drink the provided:  Gatorade G2 Drinking this carbohydrate drink up to two hours before surgery helps to reduce insulin resistance and improve patient outcomes. Please complete drinking 2 hours before scheduled arrival time.  One week prior to surgery:Last dose on 06-06-24 Sunday Stop Anti-inflammatories (NSAIDS) such as Advil , Aleve , Ibuprofen , Motrin , Naproxen , Naprosyn  and Aspirin  based products such as Excedrin, Goody's Powder, BC Powder. Stop ANY OVER THE COUNTER supplements until after surgery (Vitamin C, Vitamin D, Folic Acid)  You may however, continue to take Tylenol  if needed for pain up until the day of surgery.  Stop metFORMIN  (GLUCOPHAGE -XR) 2 days prior to surgery-Last dose will be on 06-11-24 Friday  Stop tirzepatide  (MOUNJARO ) 7 days prior to surgery-Last dose   Continue taking all of your other prescription medications up until the day of surgery.  ON THE DAY OF SURGERY ONLY TAKE THESE MEDICATIONS WITH SIPS OF WATER: -omeprazole  (PRILOSEC)   No Alcohol for 24 hours before or after  surgery.  No Smoking including e-cigarettes for 24 hours before surgery.  No chewable tobacco products for at least 6 hours before surgery.  No nicotine patches on the day of surgery.  Do not use any recreational drugs for at least a week (preferably 2 weeks) before your surgery.  Please be advised that the combination of cocaine and anesthesia may have negative outcomes, up to and including death. If you test positive for cocaine, your surgery will be cancelled.  On the morning of surgery brush your teeth with toothpaste and water, you may rinse your mouth with mouthwash if you wish. Do not swallow any toothpaste or mouthwash.  Use CHG Soap as directed on instruction sheet.  Do not wear jewelry, make-up, hairpins, clips or nail polish.  For welded (permanent) jewelry: bracelets, anklets, waist bands, etc.  Please have this removed prior to surgery.  If it is not removed, there is a chance that hospital personnel will need to cut it off on the day of surgery.  Do not wear lotions, powders, or perfumes.   Do not shave body hair from the neck down 48 hours before surgery.  Contact lenses, hearing aids and dentures may not be worn into surgery.  Do not bring valuables to the hospital. [facility] is not responsible for any missing/lost belongings or valuables.   Notify your doctor if there is any change in your medical condition (cold, fever, infection).  Wear comfortable clothing (specific to your surgery type) to the hospital.  After surgery, you can help prevent lung complications by doing breathing exercises.  Take  deep breaths and cough every 1-2 hours. Your doctor may order a device called an Incentive Spirometer to help you take deep breaths. When coughing or sneezing, hold a pillow firmly against your incision with both hands. This is called splinting. Doing this helps protect your incision. It also decreases belly discomfort.  If you are being admitted to the hospital  overnight, leave your suitcase in the car. After surgery it may be brought to your room.  In case of increased patient census, it may be necessary for you, the patient, to continue your postoperative care in the Same Day Surgery department.  If you are being discharged the day of surgery, you will not be allowed to drive home. You will need a responsible individual to drive you home and stay with you for 24 hours after surgery.   If you are taking public transportation, you will need to have a responsible individual with you.  Please call the Pre-admissions Testing Dept. at [phone] if you have any questions about these instructions.  Surgery Visitation Policy:  Patients having surgery or a procedure may have two visitors.  Children under the age of 63 must have an adult with them who is not the patient.  Inpatient Visitation:    Visiting hours are 7 a.m. to 8 p.m. Up to four visitors are allowed at one time in a patient room. The visitors may rotate out with other people during the day.  One visitor age 46 or older may stay with the patient overnight and must be in the room by 8 p.m.    Pre-operative 4 CHG Bath Instructions   You can play a key role in reducing the risk of infection after surgery. Your skin needs to be as free of germs as possible. You can reduce the number of germs on your skin by washing with CHG (chlorhexidine  gluconate) soap before surgery. CHG is an antiseptic soap that kills germs and continues to kill germs even after washing.   DO NOT use if you have an allergy to chlorhexidine /CHG or antibacterial soaps. If your skin becomes reddened or irritated, stop using the CHG and notify one of our RNs at [phone].   Please shower with the CHG soap starting 4 days before surgery using the following schedule:     Please keep in mind the following:  DO NOT shave, including legs and underarms, starting the day of your first shower.   You may shave your face  at any point before/day of surgery.  Place clean sheets on your bed the day you start using CHG soap. Use a clean washcloth (not used since being washed) for each shower. DO NOT sleep with pets once you start using the CHG.   CHG Shower Instructions:  If you choose to wash your hair and private area, wash first with your normal shampoo/soap.  After you use shampoo/soap, rinse your hair and body thoroughly to remove shampoo/soap residue.  Turn the water OFF and apply about 3 tablespoons (45 ml) of CHG soap to a CLEAN washcloth.  Apply CHG soap ONLY FROM YOUR NECK DOWN TO YOUR TOES (washing for 3-5 minutes)  DO NOT use CHG soap on face, private areas, open wounds, or sores.  Pay special attention to the area where your surgery is being performed.  If you are having back surgery, having someone wash your back for you may be helpful. Wait 2 minutes after CHG soap is applied, then you may rinse off the CHG soap.  Pat dry with a clean towel  Put on clean clothes/pajamas   If you choose to wear lotion, please use ONLY the CHG-compatible lotions on the back of this paper.     Additional instructions for the day of surgery: DO NOT APPLY any lotions, deodorants, cologne, or perfumes.   Put on clean/comfortable clothes.  Brush your teeth.  Ask your nurse before applying any prescription medications to the skin.      CHG Compatible Lotions   Aveeno Moisturizing lotion  Cetaphil Moisturizing Cream  Cetaphil Moisturizing Lotion  Clairol Herbal Essence Moisturizing Lotion, Dry Skin  Clairol Herbal Essence Moisturizing Lotion, Extra Dry Skin  Clairol Herbal Essence Moisturizing Lotion, Normal Skin  Curel Age Defying Therapeutic Moisturizing Lotion with Alpha Hydroxy  Curel Extreme Care Body Lotion  Curel Soothing Hands Moisturizing Hand Lotion  Curel Therapeutic Moisturizing Cream, Fragrance-Free  Curel Therapeutic Moisturizing Lotion, Fragrance-Free  Curel Therapeutic Moisturizing Lotion,  Original Formula  Eucerin Daily Replenishing Lotion  Eucerin Dry Skin Therapy Plus Alpha Hydroxy Crme  Eucerin Dry Skin Therapy Plus Alpha Hydroxy Lotion  Eucerin Original Crme  Eucerin Original Lotion  Eucerin Plus Crme Eucerin Plus Lotion  Eucerin TriLipid Replenishing Lotion  Keri Anti-Bacterial Hand Lotion  Keri Deep Conditioning Original Lotion Dry Skin Formula Softly Scented  Keri Deep Conditioning Original Lotion, Fragrance Free Sensitive Skin Formula  Keri Lotion Fast Absorbing Fragrance Free Sensitive Skin Formula  Keri Lotion Fast Absorbing Softly Scented Dry Skin Formula  Keri Original Lotion  Keri Skin Renewal Lotion Keri Silky Smooth Lotion  Keri Silky Smooth Sensitive Skin Lotion  Nivea Body Creamy Conditioning Oil  Nivea Body Extra Enriched Lotion  Nivea Body Original Lotion  Nivea Body Sheer Moisturizing Lotion Nivea Crme  Nivea Skin Firming Lotion  NutraDerm 30 Skin Lotion  NutraDerm Skin Lotion  NutraDerm Therapeutic Skin Cream  NutraDerm Therapeutic Skin Lotion  ProShield Protective Hand Cream  Provon moisturizing lotion  How to Use an Incentive Spirometer An incentive spirometer is a tool that measures how well you are filling your lungs with each breath. Learning to take long, deep breaths using this tool can help you keep your lungs clear and active. This may help to reverse or lessen your chance of developing breathing (pulmonary) problems, especially infection. You may be asked to use a spirometer: After a surgery. If you have a lung problem or a history of smoking. After a long period of time when you have been unable to move or be active. If the spirometer includes an indicator to show the highest number that you have reached, your health care provider or respiratory therapist will help you set a goal. Keep a log of your progress as told by your health care provider. What are the risks? Breathing too quickly may cause dizziness or cause you to pass  out. Take your time so you do not get dizzy or light-headed. If you are in pain, you may need to take pain medicine before doing incentive spirometry. It is harder to take a deep breath if you are having pain. How to use your incentive spirometer  Sit up on the edge of your bed or on a chair. Hold the incentive spirometer so that it is in an upright position. Before you use the spirometer, breathe out normally. Place the mouthpiece in your mouth. Make sure your lips are closed tightly around it. Breathe in slowly and as deeply as you can through your mouth, causing the piston or the ball to rise toward  the top of the chamber. Hold your breath for 3-5 seconds, or for as long as possible. If the spirometer includes a coach indicator, use this to guide you in breathing. Slow down your breathing if the indicator goes above the marked areas. Remove the mouthpiece from your mouth and breathe out normally. The piston or ball will return to the bottom of the chamber. Rest for a few seconds, then repeat the steps 10 or more times. Take your time and take a few normal breaths between deep breaths so that you do not get dizzy or light-headed. Do this every 1-2 hours when you are awake. If the spirometer includes a goal marker to show the highest number you have reached (best effort), use this as a goal to work toward during each repetition. After each set of 10 deep breaths, cough a few times. This will help to make sure that your lungs are clear. If you have an incision on your chest or abdomen from surgery, place a pillow or a rolled-up towel firmly against the incision when you cough. This can help to reduce pain while taking deep breaths and coughing. General tips When you are able to get out of bed: Walk around often. Continue to take deep breaths and cough in order to clear your lungs. Keep using the incentive spirometer until your health care provider says it is okay to stop using it. If you have  been in the hospital, you may be told to keep using the spirometer at home. Contact a health care provider if: You are having difficulty using the spirometer. You have trouble using the spirometer as often as instructed. Your pain medicine is not giving enough relief for you to use the spirometer as told. You have a fever. Get help right away if: You develop shortness of breath. You develop a cough with bloody mucus from the lungs. You have fluid or blood coming from an incision site after you cough. Summary An incentive spirometer is a tool that can help you learn to take long, deep breaths to keep your lungs clear and active. You may be asked to use a spirometer after a surgery, if you have a lung problem or a history of smoking, or if you have been inactive for a long period of time. Use your incentive spirometer as instructed every 1-2 hours while you are awake. If you have an incision on your chest or abdomen, place a pillow or a rolled-up towel firmly against your incision when you cough. This will help to reduce pain. Get help right away if you have shortness of breath, you cough up bloody mucus, or blood comes from your incision when you cough. This information is not intended to replace advice given to you by your health care provider. Make sure you discuss any questions you have with your health care provider. Document Revised: 03/07/2023 Document Reviewed: 03/07/2023 Elsevier Patient Education  2024 Elsevier Inc.   Preoperative Educational Videos for Total Hip, Knee and Shoulder Replacements  To better prepare for surgery, please view our videos that explain the physical activity and discharge planning required to have the best surgical recovery at [facility].  [email or web address]  Questions? Call [phone] or email [email or web address]      Community Resource Directory to address  health-related social needs:  [email or web address]

## 2024-06-03 NOTE — Telephone Encounter (Signed)
"  ° °  Pre-operative Risk Assessment    Patient Name: Melissa Cabrera  DOB: [birth year] MRN: [id number]   Date of last office visit: 04/20/24 Date of next office visit: n/a   Request for Surgical Clearance    Procedure:  Left TKA  Date of Surgery:  Clearance 06/14/24                                Surgeon:  Dr Lorelle Socks Group or Practice Name:  [facility] Phone number:  [phone] Fax number:  [phone]   Type of Clearance Requested:   - Medical    Type of Anesthesia:  Not Indicated   Additional requests/questions:    SignedRosina Stamps   06/03/2024, 2:54 PM   "

## 2024-06-03 NOTE — Telephone Encounter (Signed)
" ° °  Name: Melissa Cabrera  DOB: 04-24-1956  MRN: [id number]   Primary Cardiologist: Caron Poser, MD  Chart reviewed as part of pre-operative protocol coverage. [name] was last seen on 04/20/2024 by Dr. Poser.  She underwent cardiac PET stress testing for risk stratification which showed no ischemia.   Therefore, based on ACC/AHA guidelines, the patient would be an acceptable risk for the planned procedure without further cardiovascular testing.   Ideally aspirin  should be continued without interruption, however if the bleeding risk is too great, aspirin  may be held for 5-7 days prior to surgery. Please resume aspirin  post operatively when it is felt to be safe from a bleeding standpoint.    I will route this recommendation to the requesting party via Epic fax function and remove from pre-op pool. Please call with questions.  Barnie Hila, NP 06/03/2024, 3:35 PM  "

## 2024-06-04 ENCOUNTER — Other Ambulatory Visit: Payer: Self-pay

## 2024-06-04 ENCOUNTER — Encounter
Admission: RE | Admit: 2024-06-04 | Discharge: 2024-06-04 | Disposition: A | Source: Ambulatory Visit | Attending: Orthopedic Surgery

## 2024-06-04 DIAGNOSIS — Z01818 Encounter for other preprocedural examination: Secondary | ICD-10-CM

## 2024-06-04 DIAGNOSIS — M179 Osteoarthritis of knee, unspecified: Secondary | ICD-10-CM

## 2024-06-04 HISTORY — DX: Spinal stenosis, lumbar region without neurogenic claudication: M48.061

## 2024-06-04 HISTORY — DX: Malignant neoplasm of endometrium: C54.1

## 2024-06-04 HISTORY — DX: Long term (current) use of anticoagulants: Z79.01

## 2024-06-04 HISTORY — DX: Morbid (severe) obesity due to excess calories: E66.01

## 2024-06-04 HISTORY — DX: Unilateral primary osteoarthritis, right knee: M17.11

## 2024-06-04 HISTORY — DX: Type 2 diabetes mellitus without complications: E11.9

## 2024-06-04 HISTORY — DX: Type 2 diabetes mellitus with diabetic neuropathy, unspecified: E11.40

## 2024-06-04 HISTORY — DX: Arthropathic psoriasis, unspecified: L40.50

## 2024-06-04 NOTE — Progress Notes (Signed)
 Pt was a no show for her PAT in person appt for upcoming 2-2 surgery with Dr Lorelle. I called pt on the phone and did entire interview. Pt coming 06-09-24 @ 0900 for labs ua and mrsa and to pick up soap incentive spirometer ensure and surgery instructions

## 2024-06-04 NOTE — Progress Notes (Addendum)
" °  Called pt back after receiving information from Dr Marea that pt does not need to stop her Xarelto  3 day prior to IVC filter placement (pt had been told by dr dews office that her last dose would be 06-03-24) and informed pt to continue her Xarelto  but she does need to stop it 3 days prior to Total knee replacement. Last dose will be on 06-10-24 Thursday. Pt verbalized understanding that she needs to continue her Xarelto  for her IVC filter placement on 06-07-24 but is aware last dose will be on 06-10-24 for upcoming TKA "

## 2024-06-07 ENCOUNTER — Encounter: Payer: Self-pay | Admitting: Urgent Care

## 2024-06-07 ENCOUNTER — Encounter: Admission: RE | Disposition: A | Payer: Self-pay | Source: Home / Self Care | Attending: Vascular Surgery

## 2024-06-07 ENCOUNTER — Other Ambulatory Visit: Payer: Self-pay

## 2024-06-07 ENCOUNTER — Encounter: Payer: Self-pay | Admitting: Vascular Surgery

## 2024-06-07 ENCOUNTER — Ambulatory Visit
Admission: RE | Admit: 2024-06-07 | Discharge: 2024-06-07 | Disposition: A | Discharge status: Home / Self Care | Attending: Vascular Surgery | Admitting: Vascular Surgery

## 2024-06-07 DIAGNOSIS — I110 Hypertensive heart disease with heart failure: Secondary | ICD-10-CM | POA: Insufficient documentation

## 2024-06-07 DIAGNOSIS — Z79899 Other long term (current) drug therapy: Secondary | ICD-10-CM | POA: Insufficient documentation

## 2024-06-07 DIAGNOSIS — I509 Heart failure, unspecified: Secondary | ICD-10-CM | POA: Diagnosis not present

## 2024-06-07 DIAGNOSIS — Z7984 Long term (current) use of oral hypoglycemic drugs: Secondary | ICD-10-CM | POA: Insufficient documentation

## 2024-06-07 DIAGNOSIS — Z86718 Personal history of other venous thrombosis and embolism: Secondary | ICD-10-CM

## 2024-06-07 DIAGNOSIS — Z409 Encounter for prophylactic surgery, unspecified: Secondary | ICD-10-CM | POA: Insufficient documentation

## 2024-06-07 DIAGNOSIS — E7849 Other hyperlipidemia: Secondary | ICD-10-CM | POA: Insufficient documentation

## 2024-06-07 DIAGNOSIS — Z7982 Long term (current) use of aspirin: Secondary | ICD-10-CM | POA: Insufficient documentation

## 2024-06-07 DIAGNOSIS — Z7901 Long term (current) use of anticoagulants: Secondary | ICD-10-CM | POA: Insufficient documentation

## 2024-06-07 DIAGNOSIS — Z7985 Long-term (current) use of injectable non-insulin antidiabetic drugs: Secondary | ICD-10-CM | POA: Insufficient documentation

## 2024-06-07 DIAGNOSIS — M179 Osteoarthritis of knee, unspecified: Secondary | ICD-10-CM

## 2024-06-07 DIAGNOSIS — Z4089 Encounter for other prophylactic surgery: Secondary | ICD-10-CM | POA: Diagnosis not present

## 2024-06-07 DIAGNOSIS — I82409 Acute embolism and thrombosis of unspecified deep veins of unspecified lower extremity: Secondary | ICD-10-CM

## 2024-06-07 DIAGNOSIS — E1169 Type 2 diabetes mellitus with other specified complication: Secondary | ICD-10-CM | POA: Insufficient documentation

## 2024-06-07 LAB — GLUCOSE, CAPILLARY: Glucose-Capillary: 106 mg/dL — ABNORMAL HIGH (ref 70–99)

## 2024-06-07 MED ORDER — SODIUM CHLORIDE 0.9 % IV SOLN
INTRAVENOUS | Status: DC
  Administered 2024-06-07: 75 mL via INTRAVENOUS

## 2024-06-07 MED ORDER — LACTATED RINGERS IV SOLN: INTRAVENOUS | Status: DC

## 2024-06-07 MED ORDER — HEPARIN (PORCINE) IN NACL 1000-0.9 UT/500ML-% IV SOLN
INTRAVENOUS | Status: DC | PRN
  Administered 2024-06-07: 500 mL

## 2024-06-07 MED ORDER — MIDAZOLAM HCL 2 MG/2ML IJ SOLN
INTRAMUSCULAR | Status: AC
  Filled 2024-06-07: qty 2

## 2024-06-07 MED ORDER — CHLORHEXIDINE GLUCONATE 0.12 % MT SOLN
15.0000 mL | Freq: Once | OROMUCOSAL | Status: DC
  Filled 2024-06-07: qty 15

## 2024-06-07 MED ORDER — ONDANSETRON HCL 4 MG/2ML IJ SOLN: 4.0000 mg | Freq: Four times a day (QID) | INTRAMUSCULAR | Status: DC | PRN

## 2024-06-07 MED ORDER — FENTANYL CITRATE (PF) 100 MCG/2ML IJ SOLN
INTRAMUSCULAR | Status: DC | PRN
  Administered 2024-06-07: 50 ug via INTRAVENOUS

## 2024-06-07 MED ORDER — MIDAZOLAM HCL 2 MG/ML PO SYRP: 8.0000 mg | ORAL_SOLUTION | Freq: Once | ORAL | Status: DC | PRN

## 2024-06-07 MED ORDER — FAMOTIDINE 20 MG PO TABS: 40.0000 mg | ORAL_TABLET | Freq: Once | ORAL | Status: DC | PRN

## 2024-06-07 MED ORDER — HYDROMORPHONE HCL 1 MG/ML IJ SOLN: 1.0000 mg | Freq: Once | INTRAMUSCULAR | Status: DC | PRN

## 2024-06-07 MED ORDER — CEFAZOLIN SODIUM-DEXTROSE 2-4 GM/100ML-% IV SOLN
2.0000 g | INTRAVENOUS | Status: AC
  Administered 2024-06-07: 2 g via INTRAVENOUS

## 2024-06-07 MED ORDER — ORAL CARE MOUTH RINSE: 15.0000 mL | Freq: Once | OROMUCOSAL | Status: DC

## 2024-06-07 MED ORDER — FENTANYL CITRATE (PF) 50 MCG/ML IJ SOSY
PREFILLED_SYRINGE | INTRAMUSCULAR | Status: AC
  Filled 2024-06-07: qty 1

## 2024-06-07 MED ORDER — LIDOCAINE-EPINEPHRINE (PF) 1 %-1:200000 IJ SOLN
INTRAMUSCULAR | Status: DC | PRN
  Administered 2024-06-07: 10 mL

## 2024-06-07 MED ORDER — IODIXANOL 320 MG/ML IV SOLN
INTRAVENOUS | Status: DC | PRN
  Administered 2024-06-07: 15 mL

## 2024-06-07 MED ORDER — METHYLPREDNISOLONE SODIUM SUCC 125 MG IJ SOLR: 125.0000 mg | Freq: Once | INTRAMUSCULAR | Status: DC | PRN

## 2024-06-07 MED ORDER — DIPHENHYDRAMINE HCL 50 MG/ML IJ SOLN: 50.0000 mg | Freq: Once | INTRAMUSCULAR | Status: DC | PRN

## 2024-06-07 MED ORDER — MIDAZOLAM HCL (PF) 2 MG/2ML IJ SOLN
INTRAMUSCULAR | Status: DC | PRN
  Administered 2024-06-07: 2 mg via INTRAVENOUS

## 2024-06-07 NOTE — Op Note (Signed)
 [facility]   OPERATIVE NOTE    PRE-OPERATIVE DIAGNOSIS: Previous unprovoked DVT, upcoming major orthopedic surgery  POST-OPERATIVE DIAGNOSIS: same as above  PROCEDURE: 1.   Ultrasound guidance for vascular access to the right femoral vein 2.   Catheter placement into the inferior vena cava 3.   Inferior venacavogram 4.   Placement of a option Elite IVC filter  SURGEON: Selinda Gu, MD  ASSISTANT(S): None  ANESTHESIA: local with Moderate Conscious Sedation for approximately 9 minutes using 2 mg of Versed  and 50 mcg of Fentanyl   ESTIMATED BLOOD LOSS: minimal  CONTRAST: 15 cc  FLUORO TIME: less than one minute  FINDING(S): 1.  Patent IVC  SPECIMEN(S):  none  INDICATIONS:   Melissa Cabrera is a 69 y.o. female who presents with a previous unprovoked DVT and upcoming major orthopedic surgery putting her at high risk for thromboembolic complications.  Inferior vena cava filter is indicated for this reason.  Risks and benefits including filter thrombosis, migration, fracture, bleeding, and infection were all discussed.  We discussed that all IVC filters that we place can be removed if desired from the patient once the need for the filter has passed.    DESCRIPTION: After obtaining full informed written consent, the patient was brought back to the vascular suite. The skin was sterilely prepped and draped in a sterile surgical field was created. Moderate conscious sedation was administered during a face to face encounter with the patient throughout the procedure with my supervision of the RN administering medicines and monitoring the patient's vital signs, pulse oximetry, telemetry and mental status throughout from the start of the procedure until the patient was taken to the recovery room. The right femoral vein was accessed under direct ultrasound guidance without difficulty with a Seldinger needle and a J-wire was then placed. After skin nick and dilatation, the  delivery sheath was placed into the inferior vena cava and an inferior venacavogram was performed. This demonstrated a patent IVC with the level of the renal veins at L1.  The filter was then deployed into the inferior vena cava at the level of the top of L2 just below the renal veins. The delivery sheath was then removed. Pressure was held. Sterile dressings were placed. The patient tolerated the procedure well and was taken to the recovery room in stable condition.  COMPLICATIONS: None  CONDITION: Stable  Selinda Gu  06/07/2024, 11:49 AM   This note was created with Dragon Medical transcription system. Any errors in dictation are purely unintentional.

## 2024-06-07 NOTE — Interval H&P Note (Signed)
 History and Physical Interval Note:  06/07/2024 10:46 AM  [name]  has presented today for surgery, with the diagnosis of IVC filter insertion    DVT.  The various methods of treatment have been discussed with the patient and family. After consideration of risks, benefits and other options for treatment, the patient has consented to  Procedures: IVC FILTER INSERTION (N/A) as a surgical intervention.  The patient's history has been reviewed, patient examined, no change in status, stable for surgery.  I have reviewed the patient's chart and labs.  Questions were answered to the patient's satisfaction.     Melissa Cabrera

## 2024-06-09 ENCOUNTER — Encounter: Payer: Self-pay | Admitting: Orthopedic Surgery

## 2024-06-09 ENCOUNTER — Encounter: Payer: Self-pay | Admitting: Urgent Care

## 2024-06-09 ENCOUNTER — Encounter
Admission: RE | Admit: 2024-06-09 | Discharge: 2024-06-09 | Disposition: A | Discharge status: Home / Self Care | Source: Ambulatory Visit | Attending: Orthopedic Surgery | Admitting: Orthopedic Surgery

## 2024-06-09 DIAGNOSIS — M179 Osteoarthritis of knee, unspecified: Secondary | ICD-10-CM | POA: Insufficient documentation

## 2024-06-09 DIAGNOSIS — Z01812 Encounter for preprocedural laboratory examination: Secondary | ICD-10-CM | POA: Insufficient documentation

## 2024-06-09 LAB — COMPREHENSIVE METABOLIC PANEL WITH GFR
ALT: 16 U/L (ref 0–44)
AST: 32 U/L (ref 15–41)
Albumin: 4.2 g/dL (ref 3.5–5.0)
Alkaline Phosphatase: 73 U/L (ref 38–126)
Anion gap: 12 (ref 5–15)
BUN: 11 mg/dL (ref 8–23)
CO2: 27 mmol/L (ref 22–32)
Calcium: 9.3 mg/dL (ref 8.9–10.3)
Chloride: 104 mmol/L (ref 98–111)
Creatinine, Ser: 0.84 mg/dL (ref 0.44–1.00)
GFR, Estimated: >60 mL/min
Glucose, Bld: 86 mg/dL (ref 70–99)
Potassium: 3.3 mmol/L — ABNORMAL LOW (ref 3.5–5.1)
Sodium: 143 mmol/L (ref 135–145)
Total Bilirubin: 0.6 mg/dL (ref 0.0–1.2)
Total Protein: 6.5 g/dL (ref 6.5–8.1)

## 2024-06-09 LAB — URINALYSIS, ROUTINE W REFLEX MICROSCOPIC
Bilirubin Urine: NEGATIVE
Glucose, UA: NEGATIVE mg/dL
Hgb urine dipstick: NEGATIVE
Ketones, ur: NEGATIVE mg/dL
Leukocytes,Ua: NEGATIVE
Nitrite: NEGATIVE
Protein, ur: NEGATIVE mg/dL
Specific Gravity, Urine: 1.006 (ref 1.005–1.030)
pH: 5 (ref 5.0–8.0)

## 2024-06-09 LAB — SURGICAL PCR SCREEN
MRSA, PCR: NEGATIVE
Staphylococcus aureus: NEGATIVE

## 2024-06-11 NOTE — Telephone Encounter (Signed)
 I will send notes to requesting office to see the notes from Barnie Hila, NP and the notes from Dr. Argentina pt has been cleared as of 06/03/24 with notes faxed to [phone], see the notes pt has been cleared and see notes about ASA.

## 2024-06-11 NOTE — Telephone Encounter (Signed)
 Patient stated Dr. Mal office will need clearance confirmation today for surgery scheduled on 2/2.  Fax# [phone].  Patient wants a call back to confirm clearance sent.

## 2024-06-13 MED ORDER — TRANEXAMIC ACID-NACL 1000-0.7 MG/100ML-% IV SOLN
1000.0000 mg | INTRAVENOUS | Status: AC
  Administered 2024-06-14 (×2): 1000 mg via INTRAVENOUS

## 2024-06-13 MED ORDER — DEXAMETHASONE SOD PHOSPHATE PF 10 MG/ML IJ SOLN: 8.0000 mg | Freq: Once | INTRAMUSCULAR | Status: DC

## 2024-06-13 MED ORDER — CEFAZOLIN SODIUM-DEXTROSE 2-4 GM/100ML-% IV SOLN
2.0000 g | INTRAVENOUS | Status: AC
  Administered 2024-06-14: 2 g via INTRAVENOUS

## 2024-06-14 ENCOUNTER — Other Ambulatory Visit: Payer: Self-pay

## 2024-06-14 ENCOUNTER — Encounter: Payer: Self-pay | Admitting: Orthopedic Surgery

## 2024-06-14 ENCOUNTER — Ambulatory Visit
Admission: RE | Admit: 2024-06-14 | Discharge: 2024-06-15 | Disposition: A | Attending: Orthopedic Surgery | Admitting: Orthopedic Surgery

## 2024-06-14 ENCOUNTER — Ambulatory Visit: Payer: Self-pay | Admitting: Urgent Care

## 2024-06-14 ENCOUNTER — Encounter: Admission: RE | Disposition: A | Payer: Self-pay | Source: Home / Self Care | Attending: Orthopedic Surgery

## 2024-06-14 ENCOUNTER — Ambulatory Visit

## 2024-06-14 DIAGNOSIS — K219 Gastro-esophageal reflux disease without esophagitis: Secondary | ICD-10-CM | POA: Insufficient documentation

## 2024-06-14 DIAGNOSIS — Z79899 Other long term (current) drug therapy: Secondary | ICD-10-CM | POA: Insufficient documentation

## 2024-06-14 DIAGNOSIS — G4730 Sleep apnea, unspecified: Secondary | ICD-10-CM | POA: Insufficient documentation

## 2024-06-14 DIAGNOSIS — E1151 Type 2 diabetes mellitus with diabetic peripheral angiopathy without gangrene: Secondary | ICD-10-CM | POA: Insufficient documentation

## 2024-06-14 DIAGNOSIS — Z7901 Long term (current) use of anticoagulants: Secondary | ICD-10-CM | POA: Insufficient documentation

## 2024-06-14 DIAGNOSIS — I5032 Chronic diastolic (congestive) heart failure: Secondary | ICD-10-CM | POA: Insufficient documentation

## 2024-06-14 DIAGNOSIS — E119 Type 2 diabetes mellitus without complications: Secondary | ICD-10-CM

## 2024-06-14 DIAGNOSIS — Z87891 Personal history of nicotine dependence: Secondary | ICD-10-CM | POA: Insufficient documentation

## 2024-06-14 DIAGNOSIS — E1140 Type 2 diabetes mellitus with diabetic neuropathy, unspecified: Secondary | ICD-10-CM | POA: Insufficient documentation

## 2024-06-14 DIAGNOSIS — Z86718 Personal history of other venous thrombosis and embolism: Secondary | ICD-10-CM | POA: Insufficient documentation

## 2024-06-14 DIAGNOSIS — I110 Hypertensive heart disease with heart failure: Secondary | ICD-10-CM | POA: Insufficient documentation

## 2024-06-14 DIAGNOSIS — F419 Anxiety disorder, unspecified: Secondary | ICD-10-CM | POA: Insufficient documentation

## 2024-06-14 DIAGNOSIS — Z7985 Long-term (current) use of injectable non-insulin antidiabetic drugs: Secondary | ICD-10-CM | POA: Insufficient documentation

## 2024-06-14 DIAGNOSIS — Z96652 Presence of left artificial knee joint: Secondary | ICD-10-CM

## 2024-06-14 DIAGNOSIS — Z7984 Long term (current) use of oral hypoglycemic drugs: Secondary | ICD-10-CM | POA: Insufficient documentation

## 2024-06-14 DIAGNOSIS — M1712 Unilateral primary osteoarthritis, left knee: Secondary | ICD-10-CM | POA: Insufficient documentation

## 2024-06-14 DIAGNOSIS — Z6841 Body Mass Index (BMI) 40.0 and over, adult: Secondary | ICD-10-CM | POA: Insufficient documentation

## 2024-06-14 HISTORY — DX: Fatty (change of) liver, not elsewhere classified: K76.0

## 2024-06-14 LAB — GLUCOSE, CAPILLARY
Glucose-Capillary: 99 mg/dL (ref 70–99)
Glucose-Capillary: 102 mg/dL — ABNORMAL HIGH (ref 70–99)
Glucose-Capillary: 151 mg/dL — ABNORMAL HIGH (ref 70–99)
Glucose-Capillary: 161 mg/dL — ABNORMAL HIGH (ref 70–99)

## 2024-06-14 MED ORDER — SENNA 8.6 MG PO TABS
1.0000 | ORAL_TABLET | Freq: Every day | ORAL | Status: DC
  Administered 2024-06-15: 8.6 mg via ORAL
  Filled 2024-06-14: qty 1

## 2024-06-14 MED ORDER — KETOROLAC TROMETHAMINE 30 MG/ML IJ SOLN
INTRAMUSCULAR | Status: AC
  Filled 2024-06-14: qty 1

## 2024-06-14 MED ORDER — KETOROLAC TROMETHAMINE 15 MG/ML IJ SOLN
7.5000 mg | Freq: Four times a day (QID) | INTRAMUSCULAR | Status: AC
  Administered 2024-06-14 – 2024-06-15 (×4): 7.5 mg via INTRAVENOUS
  Filled 2024-06-14 (×3): qty 1

## 2024-06-14 MED ORDER — SODIUM CHLORIDE (PF) 0.9 % IJ SOLN
INTRAMUSCULAR | Status: DC | PRN
  Administered 2024-06-14: 71 mL via INTRAMUSCULAR

## 2024-06-14 MED ORDER — MORPHINE SULFATE (PF) 4 MG/ML IV SOLN: 0.5000 mg | INTRAVENOUS | Status: DC | PRN

## 2024-06-14 MED ORDER — LIDOCAINE HCL (CARDIAC) PF 100 MG/5ML IV SOSY
PREFILLED_SYRINGE | INTRAVENOUS | Status: DC | PRN
  Administered 2024-06-14: 100 mg via INTRAVENOUS

## 2024-06-14 MED ORDER — STERILE WATER FOR IRRIGATION IR SOLN
Status: DC | PRN
  Administered 2024-06-14: 1000 mL

## 2024-06-14 MED ORDER — CHLORHEXIDINE GLUCONATE 0.12 % MT SOLN
OROMUCOSAL | Status: AC
  Filled 2024-06-14: qty 15

## 2024-06-14 MED ORDER — EZETIMIBE 10 MG PO TABS
10.0000 mg | ORAL_TABLET | Freq: Every day | ORAL | Status: DC
  Administered 2024-06-14 – 2024-06-15 (×2): 10 mg via ORAL

## 2024-06-14 MED ORDER — DROPERIDOL 2.5 MG/ML IJ SOLN: 0.6250 mg | Freq: Once | INTRAMUSCULAR | Status: DC | PRN

## 2024-06-14 MED ORDER — DEXAMETHASONE SOD PHOSPHATE PF 10 MG/ML IJ SOLN
INTRAMUSCULAR | Status: AC
  Filled 2024-06-14: qty 1

## 2024-06-14 MED ORDER — ACETAMINOPHEN 325 MG PO TABS: 325.0000 mg | ORAL_TABLET | Freq: Four times a day (QID) | ORAL | Status: DC | PRN

## 2024-06-14 MED ORDER — KETOROLAC TROMETHAMINE 15 MG/ML IJ SOLN
INTRAMUSCULAR | Status: AC
  Filled 2024-06-14: qty 1

## 2024-06-14 MED ORDER — HYDROCODONE-ACETAMINOPHEN 5-325 MG PO TABS
1.0000 | ORAL_TABLET | ORAL | Status: DC | PRN
  Administered 2024-06-14: 1 via ORAL
  Filled 2024-06-14: qty 1

## 2024-06-14 MED ORDER — PHENYLEPHRINE HCL-NACL 20-0.9 MG/250ML-% IV SOLN
INTRAVENOUS | Status: DC | PRN
  Administered 2024-06-14: 50 ug/min via INTRAVENOUS

## 2024-06-14 MED ORDER — LIDOCAINE HCL (PF) 2 % IJ SOLN
INTRAMUSCULAR | Status: AC
  Filled 2024-06-14: qty 5

## 2024-06-14 MED ORDER — SODIUM CHLORIDE 0.9 % IV SOLN: INTRAVENOUS | Status: DC

## 2024-06-14 MED ORDER — FENTANYL CITRATE (PF) 100 MCG/2ML IJ SOLN: 25.0000 ug | INTRAMUSCULAR | Status: DC | PRN

## 2024-06-14 MED ORDER — INSULIN ASPART 100 UNIT/ML IJ SOLN: 0.0000 [IU] | Freq: Every day | INTRAMUSCULAR | Status: DC

## 2024-06-14 MED ORDER — INSULIN ASPART 100 UNIT/ML IJ SOLN: 0.0000 [IU] | Freq: Three times a day (TID) | INTRAMUSCULAR | Status: DC

## 2024-06-14 MED ORDER — PHENOL 1.4 % MT LIQD: 1.0000 | OROMUCOSAL | Status: DC | PRN

## 2024-06-14 MED ORDER — FUROSEMIDE 20 MG PO TABS
40.0000 mg | ORAL_TABLET | Freq: Two times a day (BID) | ORAL | Status: DC
  Administered 2024-06-14 – 2024-06-15 (×2): 40 mg via ORAL
  Filled 2024-06-14 (×2): qty 2

## 2024-06-14 MED ORDER — MENTHOL 3 MG MT LOZG: 1.0000 | LOZENGE | OROMUCOSAL | Status: DC | PRN

## 2024-06-14 MED ORDER — OXYCODONE HCL 5 MG/5ML PO SOLN: 5.0000 mg | Freq: Once | ORAL | Status: DC | PRN

## 2024-06-14 MED ORDER — TRANEXAMIC ACID-NACL 1000-0.7 MG/100ML-% IV SOLN
INTRAVENOUS | Status: AC
  Filled 2024-06-14: qty 100

## 2024-06-14 MED ORDER — PROPOFOL 10 MG/ML IV BOLUS
INTRAVENOUS | Status: AC
  Filled 2024-06-14: qty 20

## 2024-06-14 MED ORDER — ACETAMINOPHEN 10 MG/ML IV SOLN
INTRAVENOUS | Status: DC | PRN
  Administered 2024-06-14: 1000 mg via INTRAVENOUS

## 2024-06-14 MED ORDER — ROSUVASTATIN CALCIUM 10 MG PO TABS
10.0000 mg | ORAL_TABLET | Freq: Every day | ORAL | Status: DC
  Administered 2024-06-14 – 2024-06-15 (×2): 10 mg via ORAL
  Filled 2024-06-14 (×2): qty 1

## 2024-06-14 MED ORDER — ONDANSETRON HCL 4 MG PO TABS: 4.0000 mg | ORAL_TABLET | Freq: Four times a day (QID) | ORAL | Status: DC | PRN

## 2024-06-14 MED ORDER — OXYCODONE HCL 5 MG PO TABS: 5.0000 mg | ORAL_TABLET | Freq: Once | ORAL | Status: DC | PRN

## 2024-06-14 MED ORDER — GABAPENTIN 100 MG PO CAPS
300.0000 mg | ORAL_CAPSULE | Freq: Three times a day (TID) | ORAL | Status: DC
  Administered 2024-06-14 – 2024-06-15 (×3): 300 mg via ORAL
  Filled 2024-06-14 (×2): qty 3

## 2024-06-14 MED ORDER — EZETIMIBE 10 MG PO TABS
ORAL_TABLET | ORAL | Status: AC
  Filled 2024-06-14: qty 1

## 2024-06-14 MED ORDER — PHENYLEPHRINE 80 MCG/ML (10ML) SYRINGE FOR IV PUSH (FOR BLOOD PRESSURE SUPPORT)
PREFILLED_SYRINGE | INTRAVENOUS | Status: AC
  Filled 2024-06-14: qty 10

## 2024-06-14 MED ORDER — CHLORHEXIDINE GLUCONATE 0.12 % MT SOLN
15.0000 mL | Freq: Once | OROMUCOSAL | Status: AC
  Administered 2024-06-14: 15 mL via OROMUCOSAL

## 2024-06-14 MED ORDER — BUPIVACAINE LIPOSOME 1.3 % IJ SUSP
INTRAMUSCULAR | Status: AC
  Filled 2024-06-14: qty 20

## 2024-06-14 MED ORDER — BUPIVACAINE HCL (PF) 0.5 % IJ SOLN
INTRAMUSCULAR | Status: DC | PRN
  Administered 2024-06-14: 3 mL

## 2024-06-14 MED ORDER — DEXAMETHASONE SOD PHOSPHATE PF 10 MG/ML IJ SOLN
INTRAMUSCULAR | Status: DC | PRN
  Administered 2024-06-14: 10 mg via INTRAVENOUS

## 2024-06-14 MED ORDER — BUPIVACAINE HCL (PF) 0.5 % IJ SOLN
INTRAMUSCULAR | Status: AC
  Filled 2024-06-14: qty 10

## 2024-06-14 MED ORDER — CEFAZOLIN SODIUM-DEXTROSE 2-4 GM/100ML-% IV SOLN
2.0000 g | Freq: Four times a day (QID) | INTRAVENOUS | Status: AC
  Administered 2024-06-14 (×2): 2 g via INTRAVENOUS
  Filled 2024-06-14: qty 100

## 2024-06-14 MED ORDER — LIDOCAINE-EPINEPHRINE 1 %-1:100000 IJ SOLN
INTRAMUSCULAR | Status: AC
  Filled 2024-06-14: qty 20

## 2024-06-14 MED ORDER — FENTANYL CITRATE (PF) 100 MCG/2ML IJ SOLN
INTRAMUSCULAR | Status: AC
  Filled 2024-06-14: qty 2

## 2024-06-14 MED ORDER — PHENYLEPHRINE 80 MCG/ML (10ML) SYRINGE FOR IV PUSH (FOR BLOOD PRESSURE SUPPORT)
PREFILLED_SYRINGE | INTRAVENOUS | Status: DC | PRN
  Administered 2024-06-14 (×2): 80 ug via INTRAVENOUS

## 2024-06-14 MED ORDER — RIVAROXABAN 10 MG PO TABS
10.0000 mg | ORAL_TABLET | Freq: Every day | ORAL | Status: DC
  Administered 2024-06-15: 10 mg via ORAL
  Filled 2024-06-14 (×2): qty 1

## 2024-06-14 MED ORDER — BACITRACIN ZINC 500 UNIT/GM EX OINT
TOPICAL_OINTMENT | CUTANEOUS | Status: AC
  Filled 2024-06-14: qty 28.35

## 2024-06-14 MED ORDER — ONDANSETRON HCL 4 MG/2ML IJ SOLN: 4.0000 mg | Freq: Four times a day (QID) | INTRAMUSCULAR | Status: DC | PRN

## 2024-06-14 MED ORDER — METOCLOPRAMIDE HCL 5 MG/ML IJ SOLN: 5.0000 mg | Freq: Three times a day (TID) | INTRAMUSCULAR | Status: DC | PRN

## 2024-06-14 MED ORDER — LOSARTAN POTASSIUM 50 MG PO TABS
50.0000 mg | ORAL_TABLET | Freq: Every day | ORAL | Status: DC
  Administered 2024-06-15: 50 mg via ORAL
  Filled 2024-06-14: qty 1

## 2024-06-14 MED ORDER — TRAMADOL HCL 50 MG PO TABS
50.0000 mg | ORAL_TABLET | Freq: Four times a day (QID) | ORAL | Status: DC | PRN
  Administered 2024-06-15: 50 mg via ORAL
  Filled 2024-06-14: qty 1

## 2024-06-14 MED ORDER — PHENYLEPHRINE HCL-NACL 20-0.9 MG/250ML-% IV SOLN
INTRAVENOUS | Status: AC
  Filled 2024-06-14: qty 250

## 2024-06-14 MED ORDER — ORAL CARE MOUTH RINSE: 15.0000 mL | Freq: Once | OROMUCOSAL | Status: AC

## 2024-06-14 MED ORDER — PANTOPRAZOLE SODIUM 40 MG PO TBEC
40.0000 mg | DELAYED_RELEASE_TABLET | Freq: Every day | ORAL | Status: DC
  Administered 2024-06-15: 40 mg via ORAL
  Filled 2024-06-14: qty 1

## 2024-06-14 MED ORDER — ACETAMINOPHEN 10 MG/ML IV SOLN: 1000.0000 mg | Freq: Once | INTRAVENOUS | Status: DC | PRN

## 2024-06-14 MED ORDER — MIDAZOLAM HCL 5 MG/5ML IJ SOLN
INTRAMUSCULAR | Status: DC | PRN
  Administered 2024-06-14: 2 mg via INTRAVENOUS

## 2024-06-14 MED ORDER — HYDROXYCHLOROQUINE SULFATE 200 MG PO TABS
200.0000 mg | ORAL_TABLET | Freq: Every day | ORAL | Status: DC
  Administered 2024-06-14 – 2024-06-15 (×2): 200 mg via ORAL
  Filled 2024-06-14 (×3): qty 1

## 2024-06-14 MED ORDER — FENTANYL CITRATE (PF) 100 MCG/2ML IJ SOLN
INTRAMUSCULAR | Status: DC | PRN
  Administered 2024-06-14 (×2): 25 ug via INTRAVENOUS
  Administered 2024-06-14: 50 ug via INTRAVENOUS

## 2024-06-14 MED ORDER — SODIUM CHLORIDE (PF) 0.9 % IJ SOLN
INTRAMUSCULAR | Status: AC
  Filled 2024-06-14: qty 20

## 2024-06-14 MED ORDER — PROPOFOL 500 MG/50ML IV EMUL
INTRAVENOUS | Status: DC | PRN
  Administered 2024-06-14: 40 ug/kg/min via INTRAVENOUS

## 2024-06-14 MED ORDER — PROPOFOL 1000 MG/100ML IV EMUL
INTRAVENOUS | Status: AC
  Filled 2024-06-14: qty 100

## 2024-06-14 MED ORDER — METOCLOPRAMIDE HCL 5 MG PO TABS: 5.0000 mg | ORAL_TABLET | Freq: Three times a day (TID) | ORAL | Status: DC | PRN

## 2024-06-14 MED ORDER — ACETAMINOPHEN 500 MG PO TABS
1000.0000 mg | ORAL_TABLET | Freq: Three times a day (TID) | ORAL | Status: DC
  Administered 2024-06-14 – 2024-06-15 (×2): 1000 mg via ORAL
  Filled 2024-06-14 (×2): qty 2

## 2024-06-14 MED ORDER — CEFAZOLIN SODIUM-DEXTROSE 2-4 GM/100ML-% IV SOLN
INTRAVENOUS | Status: AC
  Filled 2024-06-14: qty 100

## 2024-06-14 MED ORDER — DOXYCYCLINE HYCLATE 100 MG PO TABS
50.0000 mg | ORAL_TABLET | Freq: Every day | ORAL | Status: DC
  Administered 2024-06-14 – 2024-06-15 (×2): 50 mg via ORAL
  Filled 2024-06-14 (×2): qty 1
  Filled 2024-06-14: qty 0.5

## 2024-06-14 MED ORDER — SURGIPHOR WOUND IRRIGATION SYSTEM - OPTIME: TOPICAL | Status: DC | PRN

## 2024-06-14 MED ORDER — SODIUM CHLORIDE 0.9 % IR SOLN
Status: DC | PRN
  Administered 2024-06-14: 1000 mL

## 2024-06-14 MED ORDER — MIDAZOLAM HCL 2 MG/2ML IJ SOLN
INTRAMUSCULAR | Status: AC
  Filled 2024-06-14: qty 2

## 2024-06-14 MED ORDER — ACETAMINOPHEN 10 MG/ML IV SOLN
INTRAVENOUS | Status: AC
  Filled 2024-06-14: qty 100

## 2024-06-14 NOTE — Interval H&P Note (Signed)
 Patient history and physical updated. Consent reviewed including risks, benefits, and alternatives to surgery. Patient agrees with above plan to proceed with left total knee arthroplasty.

## 2024-06-14 NOTE — TOC Initial Note (Signed)
 Transition of Care [facility]) - Initial/Assessment Note    Patient Details  Name: Melissa Cabrera MRN: [id number] Date of Birth: [birth year]  [facility]) CM/SW Contact:    Nathanael CHRISTELLA Ring, RN Phone Number: 06/14/2024, 4:09 PM  Clinical Narrative:                  Home health PT and OT has been prearranged with [facility].  Patient will only need a RW at discharge tomorrow.  [facility] will deliver RW to the bedside before DC.         Patient Goals and CMS Choice            Expected Discharge Plan and Services                                              Prior Living Arrangements/Services                       Activities of Daily Living   ADL Screening (condition at time of admission) Independently performs ADLs?: Yes (appropriate for developmental age) Is the patient deaf or have difficulty hearing?: No Does the patient have difficulty seeing, even when wearing glasses/contacts?: No Does the patient have difficulty concentrating, remembering, or making decisions?: No  Permission Sought/Granted                  Emotional Assessment              Admission diagnosis:  Primary osteoarthritis of left knee [M17.12] S/P TKR (total knee replacement), left [Z96.652] Patient Active Problem List   Diagnosis Date Noted   S/P TKR (total knee replacement), left 06/14/2024   Morbid obesity (HCC) 04/26/2024   Chronic pain of both knees 04/26/2024   Spondylolisthesis at L4-L5 level 02/09/2024   Chronic back pain greater than 3 months duration 02/09/2024   Carpal tunnel syndrome 12/16/2023   Mixed incontinence 12/16/2023   Lumbar spinal stenosis 12/16/2022   Thyroid  nodule 12/16/2022   GAD (generalized anxiety disorder) 06/07/2022   Diabetic neuropathy (HCC) 10/10/2021   Senile purpura 10/10/2021   History of endometrial cancer 04/11/2021   Chronic diastolic congestive heart failure (HCC) 12/21/2019   History of DVT (deep vein thrombosis)  12/21/2019   Rosacea 03/24/2018   Psoriatic arthritis (HCC) 02/04/2017   Osteoarthritis 11/05/2016   Hypertension 09/24/2016   Diabetes mellitus type 2, uncomplicated (HCC) 09/24/2016   Gastroesophageal reflux disease without esophagitis 04/12/2014   Hyperlipidemia associated with type 2 diabetes mellitus (HCC) 04/12/2014   PCP:  Antonette Angeline ORN, NP Pharmacy:   [facility] DELIV[facility] - [place], [place] - [address] [place] [address] Phone: [phone] Fax: [phone]  [facility] [address]), [place] - [address] [place] ([place]) [place] [address] Phone: [phone] Fax: [phone]  [facility] - [place], [place] - [address], [address] [place] [address] Phone: [phone] Fax: [phone]     Social Drivers of Health (SDOH) Social History: SDOH Screenings   Food Insecurity: No Food Insecurity (06/14/2024)  Housing: Low Risk (06/14/2024)  Transportation Needs: No Transportation Needs (06/14/2024)  Utilities: Not At Risk (06/14/2024)  Alcohol Screen: Low Risk (04/18/2023)  Depression (PHQ2-9): Low Risk (04/26/2024)  Financial Resource  Strain: Low Risk  (05/19/2024)   Received from [facility]  Physical Activity: Inactive (04/23/2024)  Social Connections: Moderately Integrated (06/14/2024)  Stress: No Stress Concern Present (04/23/2024)  Tobacco Use: Medium Risk (06/14/2024)  Health Literacy: Adequate Health Literacy (04/23/2024)   SDOH Interventions:     Readmission Risk Interventions     No data to display

## 2024-06-14 NOTE — Anesthesia Procedure Notes (Signed)
 Spinal  Patient location during procedure: OR Reason for block: surgical anesthesia  Staffing Authorized by: Myra Lynwood MATSU, MD   Performed by: Dorcus Juliene SAILOR, CRNA  Preanesthetic Checklist Completed: patient identified, IV checked, site marked, risks and benefits discussed, surgical consent, monitors and equipment checked, pre-op evaluation and timeout performed Spinal Block Patient position: sitting Prep: Betadine and DuraPrep Patient monitoring: heart rate, cardiac monitor, continuous pulse ox and blood pressure Approach: midline Location: L3-4 Injection technique: single-shot Needle Needle type: Sprotte  Needle gauge: 24 G Needle length: 9 cm Assessment Sensory level: T4 Events: CSF return  Additional Notes Negative paresthesia. Negative blood return. Positive free-flowing CSF. Expiration date of kit checked and confirmed. Patient tolerated procedure well, without complications.

## 2024-06-14 NOTE — Progress Notes (Signed)
 PT Cancellation Note  Patient Details Name: Melissa Cabrera MRN: [id number] DOB: 1955/12/10   Cancelled Treatment:    Reason Eval/Treat Not Completed: Medical issues which prohibited therapy Orders received, chart reviewed. Patient remains numb in LLE with inability to PF/DF ankle. Will hold until medically appropriate to initiate PT following TKA.   [name], PT, DPT Physical Therapist - [place]  [name]   [name] 06/14/2024, 3:30 PM

## 2024-06-14 NOTE — Transfer of Care (Signed)
 Immediate Anesthesia Transfer of Care Note  Patient: Melissa Cabrera  Procedure(s) Performed: ARTHROPLASTY, KNEE, TOTAL (Left: Knee)  Patient Location: PACU  Anesthesia Type:Spinal  Level of Consciousness: awake, alert , and oriented  Airway & Oxygen Therapy: Patient Spontanous Breathing  Post-op Assessment: Report given to RN and Post -op Vital signs reviewed and stable  Post vital signs: Reviewed and stable  Last Vitals:  Vitals Value Taken Time  BP 88/52 06/14/24 12:20  Temp    Pulse 62 06/14/24 12:25  Resp 12 06/14/24 12:25  SpO2 98 % 06/14/24 12:25  Vitals shown include unfiled device data.  Last Pain:  Vitals:   06/14/24 0756  TempSrc: Temporal  PainSc: 4          Complications: No notable events documented.

## 2024-06-14 NOTE — Anesthesia Procedure Notes (Signed)
 Spinal  Patient location during procedure: OR Start time: 06/14/2024 10:25 AM End time: 06/14/2024 10:35 AM Reason for block: surgical anesthesia  Staffing Performed: resident/CRNA  Authorized by: Myra Lynwood MATSU, MD   Performed by: Dorcus Juliene SAILOR, CRNA  Preanesthetic Checklist Completed: patient identified, IV checked, site marked, risks and benefits discussed, surgical consent, monitors and equipment checked, pre-op evaluation and timeout performed Spinal Block Patient position: sitting Prep: ChloraPrep Patient monitoring: heart rate, continuous pulse ox, blood pressure and cardiac monitor Approach: midline Location: L4-5 Injection technique: single-shot Needle Needle type: Whitacre and Introducer  Needle gauge: 24 G Needle length: 9 cm Assessment Events: CSF return  Additional Notes Negative paresthesia. Negative blood return. Positive free-flowing CSF. Expiration date of kit checked and confirmed. Patient tolerated procedure well, without complications.

## 2024-06-14 NOTE — Anesthesia Postprocedure Evaluation (Signed)
"   Anesthesia Post Note  Patient: Melissa Cabrera  Procedure(s) Performed: ARTHROPLASTY, KNEE, TOTAL (Left: Knee)  Patient location during evaluation: PACU Anesthesia Type: Spinal Level of consciousness: awake and alert Pain management: pain level controlled Vital Signs Assessment: post-procedure vital signs reviewed and stable Respiratory status: spontaneous breathing, nonlabored ventilation, respiratory function stable and patient connected to nasal cannula oxygen Cardiovascular status: blood pressure returned to baseline and stable Postop Assessment: no apparent nausea or vomiting Anesthetic complications: no   No notable events documented.   Last Vitals:  Vitals:   06/14/24 1231 06/14/24 1235  BP:    Pulse: 62 63  Resp: 12 13  Temp:    SpO2: 98% 98%    Last Pain:  Vitals:   06/14/24 0756  TempSrc: Temporal  PainSc: 4                  [place] KANDICE Clause      "

## 2024-06-14 NOTE — Plan of Care (Signed)

## 2024-06-15 ENCOUNTER — Encounter: Payer: Self-pay | Admitting: Orthopedic Surgery

## 2024-06-15 ENCOUNTER — Other Ambulatory Visit: Payer: Self-pay

## 2024-06-15 LAB — CBC
HCT: 32.1 % — ABNORMAL LOW (ref 36.0–46.0)
Hemoglobin: 11 g/dL — ABNORMAL LOW (ref 12.0–15.0)
MCH: 34.2 pg — ABNORMAL HIGH (ref 26.0–34.0)
MCHC: 34.3 g/dL (ref 30.0–36.0)
MCV: 99.7 fL (ref 80.0–100.0)
Platelets: 195 10*3/uL (ref 150–400)
RBC: 3.22 MIL/uL — ABNORMAL LOW (ref 3.87–5.11)
RDW: 14.9 % (ref 11.5–15.5)
WBC: 12.1 10*3/uL — ABNORMAL HIGH (ref 4.0–10.5)
nRBC: 0 % (ref 0.0–0.2)

## 2024-06-15 LAB — BASIC METABOLIC PANEL WITH GFR
Anion gap: 7 (ref 5–15)
BUN: 16 mg/dL (ref 8–23)
CO2: 25 mmol/L (ref 22–32)
Calcium: 9 mg/dL (ref 8.9–10.3)
Chloride: 108 mmol/L (ref 98–111)
Creatinine, Ser: 0.86 mg/dL (ref 0.44–1.00)
GFR, Estimated: >60 mL/min
Glucose, Bld: 130 mg/dL — ABNORMAL HIGH (ref 70–99)
Potassium: 4.7 mmol/L (ref 3.5–5.1)
Sodium: 140 mmol/L (ref 135–145)

## 2024-06-15 LAB — GLUCOSE, CAPILLARY: Glucose-Capillary: 121 mg/dL — ABNORMAL HIGH (ref 70–99)

## 2024-06-15 MED ORDER — TRAMADOL HCL 50 MG PO TABS
50.0000 mg | ORAL_TABLET | Freq: Four times a day (QID) | ORAL | 0 refills | Status: AC | PRN
  Filled 2024-06-15: qty 30, 8d supply, fill #0

## 2024-06-15 MED ORDER — ONDANSETRON HCL 4 MG PO TABS
4.0000 mg | ORAL_TABLET | Freq: Four times a day (QID) | ORAL | 0 refills | Status: DC | PRN
  Filled 2024-06-15: qty 20, 5d supply, fill #0

## 2024-06-15 MED ORDER — ACETAMINOPHEN 500 MG PO TABS
1000.0000 mg | ORAL_TABLET | Freq: Three times a day (TID) | ORAL | 0 refills | Status: AC
  Filled 2024-06-15: qty 30, 5d supply, fill #0

## 2024-06-15 MED ORDER — OXYCODONE HCL 5 MG PO TABS
5.0000 mg | ORAL_TABLET | Freq: Four times a day (QID) | ORAL | 0 refills | Status: AC | PRN
  Filled 2024-06-15: qty 30, 8d supply, fill #0

## 2024-06-15 MED ORDER — CELECOXIB 100 MG PO CAPS
100.0000 mg | ORAL_CAPSULE | Freq: Two times a day (BID) | ORAL | 0 refills | Status: AC
  Filled 2024-06-15: qty 14, 7d supply, fill #0

## 2024-06-15 MED ORDER — GABAPENTIN 300 MG PO CAPS
ORAL_CAPSULE | ORAL | Status: AC
  Filled 2024-06-15: qty 1

## 2024-06-15 MED ORDER — EZETIMIBE 10 MG PO TABS
ORAL_TABLET | ORAL | Status: AC
  Filled 2024-06-15: qty 1

## 2024-06-15 MED ORDER — SENNA 8.6 MG PO TABS
1.0000 | ORAL_TABLET | Freq: Every day | ORAL | 0 refills | Status: AC
  Filled 2024-06-15: qty 30, 30d supply, fill #0

## 2024-06-15 NOTE — Progress Notes (Signed)
 Patient has mobility impairment for daily activities. A rolling walker will resolve this and the patient is safe to use it    T. Medford Amber, PA-C [facility]

## 2024-06-15 NOTE — TOC CM/SW Note (Signed)
 Patient is not able to walk the distance required to go the bathroom, or he/she is unable to safely negotiate stairs required to access the bathroom.  A 3in1 BSC will alleviate this problem

## 2024-06-15 NOTE — Progress Notes (Signed)
" ° °  Subjective: 1 Day Post-Op Procedures (LRB): ARTHROPLASTY, KNEE, TOTAL (Left) Patient reports pain as mild.   Patient is well, and has had no acute complaints or problems Denies any CP, SOB, ABD pain. We will continue therapy today.  Plan is to go Home after hospital stay.  Objective: Vital signs in last 24 hours: Temp:  [96.9 F (36.1 C)-97.8 F (36.6 C)] 97.8 F (36.6 C) (02/03 0750) Pulse Rate:  [59-74] 70 (02/03 0750) Resp:  [10-21] 15 (02/03 0750) BP: (88-129)/(49-85) 115/68 (02/03 0750) SpO2:  [95 %-100 %] 97 % (02/03 0750)  Intake/Output from previous day: 02/02 0701 - 02/03 0700 In: 954.8 [I.V.:669.3; IV Piggyback:285.5] Out: 25 [Blood:25] Intake/Output this shift: No intake/output data recorded.  Recent Labs    06/15/24 0602  HGB 11.0*   Recent Labs    06/15/24 0602  WBC 12.1*  RBC 3.22*  HCT 32.1*  PLT 195   Recent Labs    06/15/24 0602  NA 140  K 4.7  CL 108  CO2 25  BUN 16  CREATININE 0.86  GLUCOSE 130*  CALCIUM  9.0   No results for input(s): LABPT, INR in the last 72 hours.  EXAM General - Patient is Alert, Appropriate, and Oriented Extremity - Neurovascular intact Sensation intact distally Intact pulses distally Dorsiflexion/Plantar flexion intact Dressing - dressing C/D/I and no drainage Motor Function - intact, moving foot and toes well on exam.   Past Medical History:  Diagnosis Date   Allergy    Anticoagulated on Xarelto     Arthritis    Chicken pox    Diabetic neuropathy (HCC)    DM (diabetes mellitus), type 2 (HCC)    Endometrial cancer (HCC)    GERD (gastroesophageal reflux disease)    Heart murmur    DUE TO RHEUMATIC FEVER PER PT   Hepatic steatosis    History of DVT (deep vein thrombosis) 2018   Hyperlipidemia    Hypertension    Lumbar spinal stenosis    Morbid obesity (HCC)    Osteoarthritis of right knee    Psoriasis    Psoriatic arthritis (HCC)    Rheumatic fever    Rosacea    Sleep apnea    Does  not use Cpap    Assessment/Plan:   1 Day Post-Op Procedures (LRB): ARTHROPLASTY, KNEE, TOTAL (Left) Principal Problem:   S/P TKR (total knee replacement), left  Estimated body mass index is 37.03 kg/m as calculated from the following:   Height as of this encounter: 5' 1 (1.549 m).   Weight as of this encounter: 88.9 kg. Advance diet Up with therapy Pain well controlled Labs and vital signs are stable Good progress to physical therapy this morning.  Plan on discharge to home with home health PT today  DVT Prophylaxis - Xarelto , TED hose, and SCDs Weight-Bearing as tolerated to left leg   T. Medford Amber, PA-C [facility] 06/15/2024, 10:02 AM   "

## 2024-06-15 NOTE — Progress Notes (Signed)
 DISCHARGE NOTE:  Pt dc with IV removed and dc instructions given. Pt received a RW and a 3 in 1 to hospital room. Pt also received medications delivered to hospital room. Pt educated on TED hose and voices no questions or concerns at this time. Pt wheeled down to medical mall entrance by staff. Pt's husband provided transportation.

## 2024-06-15 NOTE — Discharge Instructions (Signed)
Instructions after Total Knee Replacement   Steffanie Rainwater M.D.     Dept. of [facility]  [place], [place]  [address]  Phone: [phone]   Fax: [phone]    DIET: Drink plenty of non-alcoholic fluids. Resume your normal diet. Include foods high in fiber.  ACTIVITY:  You may use crutches or a walker with weight-bearing as tolerated, unless instructed otherwise. You may be weaned off of the walker or crutches by your Physical Therapist.  Do NOT place pillows under the knee. Anything placed under the knee could limit your ability to straighten the knee.   Continue doing gentle exercises. Exercising will reduce the pain and swelling, increase motion, and prevent muscle weakness.   Please continue to use the TED compression stockings for 2 weeks. You may remove the stockings at night, but should reapply them in the morning. Do not drive or operate any equipment until instructed.  WOUND CARE:  Continue to use the PolarCare or ice packs periodically to reduce pain and swelling. You may begin showering 3 days after surgery with honeycomb dressing. Remove honeycomb dressing 7 days after surgery and continue showering. Allow dermabond to fall off on its own.  MEDICATIONS: You may resume your regular medications. Please take the pain medication as prescribed on the medication. Do not take pain medication on an empty stomach. Do not drive or drink alcoholic beverages when taking pain medications.  POSTOPERATIVE CONSTIPATION PROTOCOL Constipation - defined medically as fewer than three stools per week and severe constipation as less than one stool per week.  One of the most common issues patients have following surgery is constipation.  Even if you have a regular bowel pattern at home, your normal regimen is likely to be disrupted due to multiple reasons following surgery.  Combination of anesthesia, postoperative narcotics,  change in appetite and fluid intake all can affect your bowels.  In order to avoid complications following surgery, here are some recommendations in order to help you during your recovery period.  Colace (docusate) - Pick up an over-the-counter form of Colace or another stool softener and take twice a day as long as you are requiring postoperative pain medications.  Take with a full glass of water daily.  If you experience loose stools or diarrhea, hold the colace until you stool forms back up.  If your symptoms do not get better within 1 week or if they get worse, check with your doctor.  Dulcolax (bisacodyl) - Pick up over-the-counter and take as directed by the product packaging as needed to assist with the movement of your bowels.  Take with a full glass of water.  Use this product as needed if not relieved by Colace only.   MiraLax (polyethylene glycol) - Pick up over-the-counter to have on hand.  MiraLax is a solution that will increase the amount of water in your bowels to assist with bowel movements.  Take as directed and can mix with a glass of water, juice, soda, coffee, or tea.  Take if you go more than two days without a movement. Do not use MiraLax more than once per day. Call your doctor if you are still constipated or irregular after using this medication for 7 days in a row.  If you continue to have problems with postoperative constipation, please contact the office for further assistance and recommendations.  If you experience "the worst abdominal pain ever" or develop nausea or vomiting, please contact the  office immediatly for further recommendations for treatment.   CALL THE OFFICE FOR: Temperature above 101 degrees Excessive bleeding or drainage on the dressing. Excessive swelling, coldness, or paleness of the toes. Persistent nausea and vomiting.  FOLLOW-UP:  You should have an appointment to return to the office in 14 days after surgery. Arrangements have been made for  continuation of Physical Therapy (either home therapy or outpatient therapy).

## 2024-06-15 NOTE — Plan of Care (Signed)
  Problem: Clinical Measurements: Goal: Postoperative complications will be avoided or minimized Outcome: Progressing   

## 2024-06-15 NOTE — Plan of Care (Signed)
" °  Problem: Education: Goal: Knowledge of the prescribed therapeutic regimen will improve Outcome: Progressing   Problem: Bowel/Gastric: Goal: Gastrointestinal status for postoperative course will improve Outcome: Progressing   Problem: Cardiac: Goal: Ability to maintain an adequate cardiac output Outcome: Progressing   Problem: Nutritional: Goal: Will attain and maintain optimal nutritional status Outcome: Progressing   Problem: Neurological: Goal: Will regain or maintain usual level of consciousness Outcome: Progressing   "

## 2024-06-16 NOTE — Progress Notes (Unsigned)
 "  Subjective:    Patient ID: Melissa Cabrera, female    DOB: 03/28/1956, 69 y.o.   MRN: [id number]  HPI  Patient presents to clinic today for 42-month follow-up of chronic conditions.  CHF, diastolic: She reports some lower extremity edema but denies shortness of breath or chronic cough.  She is taking losartan  and furosemide  as prescribed.  Echo from 06/2023 reviewed.  She does not follow with cardiology.  HTN: Associated with diabetes. Her BP today is 112/62.  She is taking losartan  and furosemide  as prescribed.  ECG from 04/2024 reviewed.  HLD with aortic atherosclerosis: Associated with diabetes Her last LDL was 13, triglyceride 99, 12/2023.  She denies myalgias on rosuvastatin , ezetimibe . She is taking xarelto  as well. She does not consume a low-fat diet.  DM2 with peripheral neuropathy: Her last A1c was 5.3%, 04/2024.  She is taking metformin , tirzepatide  and gabapentin  as prescribed. She has not had her tirzepatide  in 2 weeks due to recent surgery. She does not check her sugars.  She checks her feet routinely.  Her last eye exam was 11/2023.  Flu never.  Pneumovax never.  Prevnar never.  COVID never.  Anxiety: Chronic, managed on buspirone  as needed.  She is not currently seeing a therapist.  She denies depression, SI/HI.  Lumbar spinal stenosis/OA/psoriatic arthritis: Mainly in her low back, knees and feet.  S/p left total knee arthroplasty. 06/2024. MRI cervical and lumbar spine from 06/2023 reviewed. She has had gel injections in her knees in the past but it has not helped.  Managed with celcoxib, gabapentin , methotrexate, hydroxychlorquine and tramadol . She is no longer on simponi or prednisone . he follows with rheumatology, orthopedics and neurosurgery.  History of DVT: Managed with lifelong xarelto . She no longer follows with hematology but does follow with vascular.  History of endometrial cancer: In remission s/p hysterectomy. She no longer follows with oncology.  GERD: Triggered by  certain medications.  She denies breakthrough on omeprazole .  There is no upper GI on file.  Rosacea: Managed with doxycycline . She follows with dermatology.  Mixed incontinence: She does wear pads. She is not currently taking any medications for this.  She does not follow with urology.  Carpal tunnel syndrome: She reports she had EMG's done by neurology. The right is worse than her left. She is not wearing splints. She is not planning cervical intervention. She follows with neurology.   Review of Systems     Past Medical History:  Diagnosis Date   Allergy    Anticoagulated on Xarelto     Arthritis    Chicken pox    Diabetic neuropathy (HCC)    DM (diabetes mellitus), type 2 (HCC)    Endometrial cancer (HCC)    GERD (gastroesophageal reflux disease)    Heart murmur    DUE TO RHEUMATIC FEVER PER PT   Hepatic steatosis    History of DVT (deep vein thrombosis) 2018   Hyperlipidemia    Hypertension    Lumbar spinal stenosis    Morbid obesity (HCC)    Osteoarthritis of right knee    Psoriasis    Psoriatic arthritis (HCC)    Rheumatic fever    Rosacea    Sleep apnea    Does not use Cpap    Current Outpatient Medications  Medication Sig Dispense Refill   acetaminophen  (TYLENOL ) 500 MG tablet Take 2 tablets (1,000 mg total) by mouth every 8 (eight) hours. 30 tablet 0   Ascorbic Acid (VITAMIN C PO) Take 500 mg by mouth  2 (two) times daily.     busPIRone  (BUSPAR ) 5 MG tablet TAKE 1 TABLET DAILY AS NEEDED 90 tablet 3   celecoxib  (CELEBREX ) 100 MG capsule Take 1 capsule (100 mg total) by mouth 2 (two) times daily for 7 days. 14 capsule 0   Cholecalciferol 25 MCG (1000 UT) tablet Take 1,000 Units by mouth daily.     doxycycline  (ADOXA) 100 MG tablet Take 50 mg by mouth daily.     ezetimibe  (ZETIA ) 10 MG tablet TAKE 1 TABLET DAILY 90 tablet 1   fluticasone  (FLONASE ) 50 MCG/ACT nasal spray Place 2 sprays into both nostrils daily. (Patient taking differently: Place 2 sprays into both  nostrils daily as needed for allergies.) 16 g 6   FOLIC ACID PO Take 1-3 tablets by mouth See admin instructions. Take 1 tablet by mouth Sunday to Thursday and 3 tablets on Friday and Saturday     furosemide  (LASIX ) 40 MG tablet TAKE 1 TABLET TWICE A DAY 180 tablet 3   gabapentin  (NEURONTIN ) 300 MG capsule TAKE 2 CAPSULES THREE TIMES A DAY 540 capsule 1   hydroxychloroquine  (PLAQUENIL ) 200 MG tablet 1 tab Orally twice a day for 90 days     losartan  (COZAAR ) 50 MG tablet TAKE 1 TABLET DAILY 90 tablet 1   metFORMIN  (GLUCOPHAGE -XR) 500 MG 24 hr tablet TAKE 1 TABLET TWICE A DAY WITH MEALS 180 tablet 1   methotrexate (RHEUMATREX) 2.5 MG tablet Take 25 mg by mouth every Friday. Caution:Chemotherapy. Protect from light.     omeprazole  (PRILOSEC) 40 MG capsule TAKE 1 CAPSULE DAILY (Patient taking differently: Take 40 mg by mouth every morning.) 90 capsule 3   ondansetron  (ZOFRAN ) 4 MG tablet Take 1 tablet (4 mg total) by mouth every 6 (six) hours as needed for nausea. 20 tablet 0   oxyCODONE  (ROXICODONE ) 5 MG immediate release tablet Take 1 tablet (5 mg total) by mouth every 6 (six) hours as needed for breakthrough pain. 30 tablet 0   rosuvastatin  (CRESTOR ) 10 MG tablet TAKE 1 TABLET DAILY 90 tablet 1   senna (SENOKOT) 8.6 MG TABS tablet Take 1 tablet (8.6 mg total) by mouth daily. 30 tablet 0   tirzepatide  (MOUNJARO ) 7.5 MG/0.5ML Pen Inject 7.5 mg into the skin once a week. (Patient taking differently: Inject 7.5 mg into the skin once a week. Fridays) 6 mL 0   traMADol  (ULTRAM ) 50 MG tablet Take 1 tablet (50 mg total) by mouth every 6 (six) hours as needed for moderate pain (pain score 4-6). 30 tablet 0   valACYclovir  (VALTREX ) 500 MG tablet 1 tab po daily. if flare, increase to 4 tabs at onset of symptoms and then 4 tabs 12 hours later. Then return to 1 tab po daily. 90 tablet 1   XARELTO  10 MG TABS tablet TAKE 1 TABLET DAILY 90 tablet 3   No current facility-administered medications for this visit.     No Known Allergies  Family History  Problem Relation Age of Onset   Cancer Mother        skin   Dementia Mother    Varicose Veins Mother    Cancer Sister        skin and liver   Stroke Maternal Grandmother    Skin cancer Brother    Cancer Brother    COPD Sister    Diabetes Daughter    Heart disease Neg Hx    Breast cancer Neg Hx     Social History   Socioeconomic History  Marital status: Married    Spouse name: Not on file   Number of children: Not on file   Years of education: Not on file   Highest education level: 12th grade  Occupational History   Not on file  Tobacco Use   Smoking status: Former    Current packs/day: 0.00    Average packs/day: 4.0 packs/day for 16.0 years (64.0 ttl pk-yrs)    Types: Cigarettes    Start date: 09/12/1979    Quit date: 09/12/1995    Years since quitting: 28.7   Smokeless tobacco: Never   Tobacco comments:    quit in 1997  Vaping Use   Vaping status: Never Used  Substance and Sexual Activity   Alcohol use: Never   Drug use: Never   Sexual activity: Yes    Birth control/protection: None  Other Topics Concern   Not on file  Social History Narrative   Not on file   Social Drivers of Health   Tobacco Use: Medium Risk (06/14/2024)   Patient History    Smoking Tobacco Use: Former    Smokeless Tobacco Use: Never    Passive Exposure: Not on file  Financial Resource Strain: Low Risk  (05/19/2024)   Received from [facility]   Overall Financial Resource Strain (CARDIA)    Difficulty of Paying Living Expenses: Not hard at all  Food Insecurity: No Food Insecurity (06/14/2024)   Epic    Worried About Running Out of Food in the Last Year: Never true    Ran Out of Food in the Last Year: Never true  Transportation Needs: No Transportation Needs (06/14/2024)   Epic    Lack of Transportation (Medical): No    Lack of Transportation (Non-Medical): No  Physical Activity: Inactive (04/23/2024)   Exercise Vital Sign     Days of Exercise per Week: 0 days    Minutes of Exercise per Session: 0 min  Stress: No Stress Concern Present (04/23/2024)   [employer] of Occupational Health - Occupational Stress Questionnaire    Feeling of Stress: Only a little  Social Connections: Moderately Integrated (06/14/2024)   Social Connection and Isolation Panel    Frequency of Communication with Friends and Family: More than three times a week    Frequency of Social Gatherings with Friends and Family: More than three times a week    Attends Religious Services: More than 4 times per year    Active Member of Clubs or Organizations: No    Attends Banker Meetings: Never    Marital Status: Married  Catering Manager Violence: Not At Risk (06/14/2024)   Epic    Fear of Current or Ex-Partner: No    Emotionally Abused: No    Physically Abused: No    Sexually Abused: No  Depression (PHQ2-9): Low Risk (04/26/2024)   Depression (PHQ2-9)    PHQ-2 Score: 2  Alcohol Screen: Low Risk (04/18/2023)   Alcohol Screen    Last Alcohol Screening Score (AUDIT): 0  Housing: Low Risk (06/14/2024)   Epic    Unable to Pay for Housing in the Last Year: No    Number of Times Moved in the Last Year: 0    Homeless in the Last Year: No  Utilities: Not At Risk (06/14/2024)   Epic    Threatened with loss of utilities: No  Health Literacy: Adequate Health Literacy (04/23/2024)   B1300 Health Literacy    Frequency of need for help with medical instructions: Never  Constitutional:  Denies fever, malaise, fatigue, headache or abnormal weight changes.  HEENT:  Denies eye pain, eye redness, ear pain, ringing in the ears, wax buildup, runny nose, nasal congestion, bloody nose, or sore throat. Respiratory: Patient reports intermittent shortness of breath.  Denies difficulty breathing, cough or sputum production.   Cardiovascular: Patient reports swelling in legs.  Denies chest pain, chest tightness, palpitations or swelling in the hands.   Gastrointestinal: Denies abdominal pain, bloating, constipation, diarrhea or blood in the stool.  GU: Pt reports mixed incontinence. Denies pain with urination, burning sensation, blood in urine, odor or discharge. Musculoskeletal: Pt reports low back pain, bilateral knee pain, bilateral foot pain, difficulty with gait. Denies decrease in range of motion, muscle pain or joint swelling.  Skin: Denies redness, rashes, lesions or ulcercations.  Neurological: Patient reports neuropathic pain of BUE/BLE.  Denies dizziness, difficulty with memory, difficulty with speech or problems with balance and coordination.  Psych: Patient has a history of anxiety.  Denies anxiety, depression, SI/HI.  No other specific complaints in a complete review of systems (except as listed in HPI above).  Objective:   Physical Exam BP 112/62 (BP Location: Left Arm, Patient Position: Sitting, Cuff Size: Normal)   Ht 5' 1 (1.549 m)   Wt 200 lb (90.7 kg)   BMI 37.79 kg/m      Wt Readings from Last 3 Encounters:  06/14/24 196 lb (88.9 kg)  06/07/24 196 lb (88.9 kg)  05/26/24 196 lb (88.9 kg)    General: Appears her stated age, obese, in NAD. Skin: Warm, dry and intact.  Senile purpura noted of BUE.  No ulcerations noted. HEENT: Head: normal shape and size; Eyes: sclera white, no icterus, conjunctiva pink, PERRLA and EOMs intact;  Neck:  Neck supple, trachea midline. No thyromegaly or thyroid  nodule present. Cardiovascular: Normal rate and rhythm. S1,S2 noted.  No murmur, rubs or gallops noted. No JVD. 1 + BLE edema. No carotid bruits noted. Pulmonary/Chest: Normal effort and positive vesicular breath sounds. No respiratory distress. No wheezes, rales or ronchi noted.  Abdomen: Normal bowel sounds.  Musculoskeletal: She has difficulty getting from a sitting to a standing position. Joint enlargement and swelling noted of the left knee. She has difficulty getting from a sitting to a standing position. Gait slow and  steady with use of walker. Neurological: Alert and oriented. Coordination normal.  Psychiatric: Mood and affect normal. Behavior is normal. Judgment and thought content normal.     BMET    Component Value Date/Time   NA 140 06/15/2024 0602   NA 144 06/07/2022 0927   K 4.7 06/15/2024 0602   CL 108 06/15/2024 0602   CO2 25 06/15/2024 0602   GLUCOSE 130 (H) 06/15/2024 0602   BUN 16 06/15/2024 0602   BUN 15 06/07/2022 0927   CREATININE 0.86 06/15/2024 0602   CREATININE 1.15 (H) 04/26/2024 1446   CALCIUM  9.0 06/15/2024 0602   GFRNONAA >60 06/15/2024 0602   GFRAA >60 04/30/2019 1203    Lipid Panel     Component Value Date/Time   CHOL 95 12/16/2023 0923   CHOL 144 06/07/2022 0927   TRIG 99 12/16/2023 0923   HDL 64 12/16/2023 0923   HDL 58 06/07/2022 0927   CHOLHDL 1.5 12/16/2023 0923   VLDL 39.0 12/21/2019 1149   LDLCALC 13 12/16/2023 0923    CBC    Component Value Date/Time   WBC 12.1 (H) 06/15/2024 0602   RBC 3.22 (L) 06/15/2024 0602   HGB 11.0 (L)  06/15/2024 0602   HGB 12.7 03/07/2022 0912   HCT 32.1 (L) 06/15/2024 0602   HCT 38.0 03/07/2022 0912   PLT 195 06/15/2024 0602   PLT 298 03/07/2022 0912   MCV 99.7 06/15/2024 0602   MCV 92 03/07/2022 0912   MCH 34.2 (H) 06/15/2024 0602   MCHC 34.3 06/15/2024 0602   RDW 14.9 06/15/2024 0602   RDW 14.3 03/07/2022 0912   LYMPHSABS 2.1 04/30/2019 1203   MONOABS 0.6 04/30/2019 1203   EOSABS 0.2 04/30/2019 1203   BASOSABS 0.0 04/30/2019 1203    Hgb A1C Lab Results  Component Value Date   HGBA1C 5.3 04/26/2024           Assessment & Plan:     RTC in 6 months for follow-up of chronic conditions Angeline Laura, NP  "

## 2024-06-17 ENCOUNTER — Encounter: Payer: Self-pay | Admitting: Internal Medicine

## 2024-06-17 ENCOUNTER — Ambulatory Visit: Admitting: Internal Medicine

## 2024-06-17 VITALS — BP 112/62 | Ht 61.0 in | Wt 200.0 lb

## 2024-06-17 DIAGNOSIS — E1143 Type 2 diabetes mellitus with diabetic autonomic (poly)neuropathy: Secondary | ICD-10-CM

## 2024-06-17 DIAGNOSIS — Z8542 Personal history of malignant neoplasm of other parts of uterus: Secondary | ICD-10-CM

## 2024-06-17 DIAGNOSIS — M150 Primary generalized (osteo)arthritis: Secondary | ICD-10-CM

## 2024-06-17 DIAGNOSIS — I5032 Chronic diastolic (congestive) heart failure: Secondary | ICD-10-CM

## 2024-06-17 DIAGNOSIS — E1169 Type 2 diabetes mellitus with other specified complication: Secondary | ICD-10-CM

## 2024-06-17 DIAGNOSIS — E1159 Type 2 diabetes mellitus with other circulatory complications: Secondary | ICD-10-CM

## 2024-06-17 DIAGNOSIS — M48061 Spinal stenosis, lumbar region without neurogenic claudication: Secondary | ICD-10-CM

## 2024-06-17 DIAGNOSIS — G5603 Carpal tunnel syndrome, bilateral upper limbs: Secondary | ICD-10-CM

## 2024-06-17 DIAGNOSIS — K219 Gastro-esophageal reflux disease without esophagitis: Secondary | ICD-10-CM

## 2024-06-17 DIAGNOSIS — D692 Other nonthrombocytopenic purpura: Secondary | ICD-10-CM

## 2024-06-17 DIAGNOSIS — E119 Type 2 diabetes mellitus without complications: Secondary | ICD-10-CM

## 2024-06-17 DIAGNOSIS — L4050 Arthropathic psoriasis, unspecified: Secondary | ICD-10-CM

## 2024-06-17 DIAGNOSIS — F411 Generalized anxiety disorder: Secondary | ICD-10-CM

## 2024-06-17 DIAGNOSIS — N3946 Mixed incontinence: Secondary | ICD-10-CM

## 2024-06-17 DIAGNOSIS — L719 Rosacea, unspecified: Secondary | ICD-10-CM

## 2024-06-17 DIAGNOSIS — Z86718 Personal history of other venous thrombosis and embolism: Secondary | ICD-10-CM

## 2024-06-17 MED ORDER — DOXYCYCLINE HYCLATE 50 MG PO CAPS: 50.0000 mg | ORAL_CAPSULE | Freq: Every day | ORAL | 1 refills | Status: AC

## 2024-06-17 NOTE — Assessment & Plan Note (Signed)
 Continue buspirone  5 mg daily as needed Support offered

## 2024-06-17 NOTE — Assessment & Plan Note (Signed)
 Complicated by morbid obesity Reinforced DASH diet, advised her to monitor daily weights Continue losartan  50 mg daily and furosemide  40 mg bid C-Met today

## 2024-06-17 NOTE — Assessment & Plan Note (Signed)
 Complicated by morbid obesity Controlled on losartan  50 mg and furosemide  40 mg twice daily Reinforced DASH diet and exercise for weight loss C-Met today

## 2024-06-17 NOTE — Assessment & Plan Note (Signed)
 Continue gabapentin  300 mg 3 times daily and tramadol  50 mg twice daily. Encouraged regular stretching Encouraged weight loss as this can help reduce joint pain She will continue to follow with orthopedics

## 2024-06-17 NOTE — Assessment & Plan Note (Addendum)
 Complicated by morbid obesity A1c today and urine microalbumin reviewed Continue metformin  XR 500 mg twice daily, tirzepatide  7.5 mg weekly and gabapentin  300 mg 3 times daily Encourage low-carb diet and exercise for weight loss Encouraged routine eye exam Encouraged routine foot exam She declines immunizations today

## 2024-06-17 NOTE — Assessment & Plan Note (Signed)
 Will need lifelong anticoagualtion Continue xarelto  10 mg daily

## 2024-06-17 NOTE — Patient Instructions (Signed)
 Healthy Eating for Your Heart Eating a healthy diet is important for the health of your heart. A heart-healthy eating plan includes: Eating less unhealthy fats. Eating more healthy fats. Eating less salt in your food. Salt is also called sodium. Making other changes in your diet. Talk with your doctor or a diet specialist (dietitian) to create an eating plan that is right for you. What is my plan? Your doctor may recommend an eating plan that includes: Total fat: ______% or less of total calories a day. Saturated fat: ______% or less of total calories a day. Cholesterol: less than _________mg a day. Sodium: less than _________mg a day. What are tips for following this plan? Cooking Avoid frying your food. Try to bake, boil, grill, or broil it instead. You can also reduce fat by: Removing the skin from poultry. Removing all visible fats from meats. Steaming vegetables in water or broth. Meal planning  At meals, divide your plate into four equal parts: Fill one-half of your plate with vegetables and green salads. Fill one-fourth of your plate with whole grains. Fill one-fourth of your plate with lean protein foods. Eat 2-4 cups of vegetables per day. One cup of vegetables is: 1 cup (91 g) broccoli or cauliflower florets. 2 medium carrots. 1 large bell pepper. 1 large sweet potato. 1 large tomato. 1 medium white potato. 2 cups (150 g) raw leafy greens. Eat 1-2 cups of fruit per day. One cup of fruit is: 1 small apple 1 large banana 1 cup (237 g) mixed fruit, 1 large orange,  cup (82 g) dried fruit, 1 cup (240 mL) 100% fruit juice. Eat more foods that have soluble fiber. These are apples, broccoli, carrots, beans, peas, and barley. Try to get 20-30 g of fiber per day. Eat 4-5 servings of nuts, legumes, and seeds per week: 1 serving of dried beans or legumes equals  cup (90 g) cooked. 1 serving of nuts is  oz (12 almonds, 24 pistachios, or 7 walnut halves). 1 serving of  seeds equals  oz (8 g). General information Eat more home-cooked food. Eat less restaurant, buffet, and fast food. Limit or avoid alcohol. Limit foods that are high in starch and sugar. Avoid fried foods. Lose weight if you are overweight. Keep track of how much salt (sodium) you eat. This is important if you have high blood pressure. Ask your doctor to tell you more about this. Try to add vegetarian meals each week. Fats Choose healthy fats. These include olive oil and canola oil, flaxseeds, walnuts, almonds, and seeds. Eat more omega-3 fats. These include salmon, mackerel, sardines, tuna, flaxseed oil, and ground flaxseeds. Try to eat fish at least 2 times each week. Check food labels. Avoid foods with trans fats or high amounts of saturated fat. Limit saturated fats. These are often found in animal products, such as meats, butter, and cream. These are also found in plant foods, such as palm oil, palm kernel oil, and coconut oil. Avoid foods with partially hydrogenated oils in them. These have trans fats. Examples are stick margarine, some tub margarines, cookies, crackers, and other baked goods. What foods should I eat? Fruits All fresh, canned (in natural juice), or frozen fruits. Vegetables Fresh or frozen vegetables (raw, steamed, roasted, or grilled). Green salads. Grains Most grains. Choose whole wheat and whole grains most of the time. Rice and pasta, including brown rice and pastas made with whole wheat. Meats and other proteins Lean, well-trimmed beef, veal, pork, and lamb. Chicken and turkey  without skin. All fish and shellfish. Wild duck, rabbit, pheasant, and venison. Egg whites or low-cholesterol egg substitutes. Dried beans, peas, lentils, and tofu. Seeds and most nuts. Dairy Low-fat or nonfat cheeses, including ricotta and mozzarella. Skim or 1% milk that is liquid, powdered, or evaporated. Buttermilk that is made with low-fat milk. Nonfat or low-fat yogurt. Fats and  oils Non-hydrogenated (trans-free) margarines. Vegetable oils, including soybean, sesame, sunflower, olive, peanut, safflower, corn, canola, and cottonseed. Salad dressings or mayonnaise made with a vegetable oil. Beverages Mineral water. Coffee and tea. Diet carbonated beverages. Sweets and desserts Sherbet, gelatin, and fruit ice. Small amounts of dark chocolate. Limit all sweets and desserts. Seasonings and condiments All seasonings and condiments. The items listed above may not be a complete list of foods and drinks you can eat. Contact a dietitian for more options. What foods should I avoid? Fruits Canned fruit in heavy syrup. Fruit in cream or butter sauce. Fried fruit. Limit coconut. Vegetables Vegetables cooked in cheese, cream, or butter sauce. Fried vegetables. Grains Breads that are made with saturated or trans fats, oils, or whole milk. Croissants. Sweet rolls. Donuts. High-fat crackers, such as cheese crackers. Meats and other proteins Fatty meats, such as hot dogs, ribs, sausage, bacon, rib-eye roast or steak. High-fat deli meats, such as salami and bologna. Caviar. Domestic duck and goose. Organ meats, such as liver. Dairy Cream, sour cream, cream cheese, and creamed cottage cheese. Whole-milk cheeses. Whole or 2% milk that is liquid, evaporated, or condensed. Whole buttermilk. Cream sauce or high-fat cheese sauce. Yogurt that is made from whole milk. Fats and oils Meat fat, or shortening. Cocoa butter, hydrogenated oils, palm oil, coconut oil, palm kernel oil. Solid fats and shortenings, including bacon fat, salt pork, lard, and butter. Nondairy cream substitutes. Salad dressings with cheese or sour cream. Beverages Regular sodas and juice drinks with added sugar. Sweets and desserts Frosting. Pudding. Cookies. Cakes. Pies. Milk chocolate or white chocolate. Buttered syrups. Full-fat ice cream or ice cream drinks. The items listed above may not be a complete list of foods  and drinks to avoid. Contact a dietitian for more information. Summary Heart-healthy meal planning includes eating less unhealthy fats, eating more healthy fats, and making other changes in your diet. Eat a balanced diet. This includes fruits and vegetables, low-fat or nonfat dairy, lean protein, nuts and legumes, whole grains, and heart-healthy oils and fats. This information is not intended to replace advice given to you by your health care provider. Make sure you discuss any questions you have with your health care provider. Document Revised: 03/08/2024 Document Reviewed: 06/04/2021 Elsevier Patient Education  2025 [employer].

## 2024-06-17 NOTE — Assessment & Plan Note (Signed)
 Continue gabapentin  300 mg 3 times daily Will monitor

## 2024-06-17 NOTE — Assessment & Plan Note (Signed)
 Complicated by morbid obesity Continue with gabapentin  300 mg 3 times daily She will continue to follow with neurosurgery

## 2024-06-17 NOTE — Assessment & Plan Note (Signed)
In remission s/p hysterectomy No longer following with oncology

## 2024-06-17 NOTE — Assessment & Plan Note (Signed)
 Continue doxycycline  50 mg daily per dermatology

## 2024-06-17 NOTE — Assessment & Plan Note (Signed)
 Continue gabapentin  300 mg 3 times daily Encourage use of wrist splints She is not interested in surgical intervention at this time

## 2024-06-17 NOTE — Assessment & Plan Note (Signed)
 Continue with gabapentin  300 mg 3 times daily, methotrexate 2.5 mg weekly, tramadol  50 mg twice daily, and hydroxychloroquine  200 mg twice daily She will continue to follow with rheumatology

## 2024-06-17 NOTE — Assessment & Plan Note (Signed)
 Complicated by morbid obesity Avoid foods that trigger reflux Encourage weight loss as this can help reduce reflux symptoms Continue omeprazole  40 mg daily

## 2024-06-17 NOTE — Assessment & Plan Note (Signed)
 Encouraged diet and exercise for weight loss Continue tirzepatide  7.5 mg weekly

## 2024-06-17 NOTE — Assessment & Plan Note (Signed)
 Complicated by morbid obesity C-Met and lipid profile today Encouraged her to consume a low-fat diet Continue rosuvastatin  10 mg, ezetimibe  10 mg daily

## 2024-06-17 NOTE — Assessment & Plan Note (Signed)
 Encouraged weight loss as this can reduce symptoms Encouraged core strengthening and Kegel exercises She will continue to wear pads

## 2024-06-17 NOTE — Assessment & Plan Note (Signed)
 CBC today.

## 2024-07-30 ENCOUNTER — Ambulatory Visit: Admitting: Pulmonary Disease

## 2024-12-15 ENCOUNTER — Ambulatory Visit: Admitting: Internal Medicine

## 2024-12-20 ENCOUNTER — Ambulatory Visit

## 2025-04-27 ENCOUNTER — Ambulatory Visit
